# Patient Record
Sex: Female | Born: 1975 | Race: White | Hispanic: No | Marital: Single | Smoking: Current every day smoker
Health system: Southern US, Community
[De-identification: ages and names within clinical notes are randomized; demographics above are authoritative.]

## PROBLEM LIST (undated history)

## (undated) ENCOUNTER — Emergency Department: Disposition: A | Discharge status: Home / Self Care | Payer: Medicaid Other

## (undated) DIAGNOSIS — F419 Anxiety disorder, unspecified: Secondary | ICD-10-CM

## (undated) DIAGNOSIS — F329 Major depressive disorder, single episode, unspecified: Secondary | ICD-10-CM

## (undated) DIAGNOSIS — I10 Essential (primary) hypertension: Secondary | ICD-10-CM

## (undated) DIAGNOSIS — E119 Type 2 diabetes mellitus without complications: Secondary | ICD-10-CM

## (undated) DIAGNOSIS — K589 Irritable bowel syndrome without diarrhea: Secondary | ICD-10-CM

## (undated) DIAGNOSIS — F32A Depression, unspecified: Secondary | ICD-10-CM

## (undated) DIAGNOSIS — K219 Gastro-esophageal reflux disease without esophagitis: Secondary | ICD-10-CM

## (undated) DIAGNOSIS — K859 Acute pancreatitis without necrosis or infection, unspecified: Secondary | ICD-10-CM

## (undated) DIAGNOSIS — T7840XA Allergy, unspecified, initial encounter: Secondary | ICD-10-CM

## (undated) HISTORY — DX: Allergy, unspecified, initial encounter: T78.40XA

## (undated) HISTORY — DX: Gastro-esophageal reflux disease without esophagitis: K21.9

## (undated) HISTORY — DX: Anxiety disorder, unspecified: F41.9

## (undated) HISTORY — DX: Depression, unspecified: F32.A

## (undated) HISTORY — DX: Major depressive disorder, single episode, unspecified: F32.9

## (undated) HISTORY — PX: ABDOMINAL HYSTERECTOMY: SHX81

## (undated) HISTORY — PX: GALLBLADDER SURGERY: SHX652

---

## 2008-08-10 ENCOUNTER — Ambulatory Visit: Payer: Self-pay | Admitting: Internal Medicine

## 2010-02-12 ENCOUNTER — Ambulatory Visit: Payer: Self-pay | Admitting: Internal Medicine

## 2011-01-17 ENCOUNTER — Ambulatory Visit: Payer: Self-pay | Admitting: Internal Medicine

## 2017-10-31 ENCOUNTER — Encounter: Payer: Self-pay | Admitting: Emergency Medicine

## 2017-10-31 ENCOUNTER — Emergency Department
Admission: EM | Admit: 2017-10-31 | Discharge: 2017-11-01 | Disposition: A | Discharge status: Home / Self Care | Payer: Self-pay | Attending: Emergency Medicine | Admitting: Emergency Medicine

## 2017-10-31 DIAGNOSIS — R110 Nausea: Secondary | ICD-10-CM

## 2017-10-31 DIAGNOSIS — F17200 Nicotine dependence, unspecified, uncomplicated: Secondary | ICD-10-CM | POA: Insufficient documentation

## 2017-10-31 DIAGNOSIS — R1013 Epigastric pain: Secondary | ICD-10-CM

## 2017-10-31 DIAGNOSIS — K2900 Acute gastritis without bleeding: Secondary | ICD-10-CM

## 2017-10-31 DIAGNOSIS — K859 Acute pancreatitis without necrosis or infection, unspecified: Secondary | ICD-10-CM

## 2017-10-31 DIAGNOSIS — I10 Essential (primary) hypertension: Secondary | ICD-10-CM | POA: Insufficient documentation

## 2017-10-31 DIAGNOSIS — E876 Hypokalemia: Secondary | ICD-10-CM

## 2017-10-31 HISTORY — DX: Essential (primary) hypertension: I10

## 2017-10-31 HISTORY — DX: Irritable bowel syndrome without diarrhea: K58.9

## 2017-10-31 LAB — COMPREHENSIVE METABOLIC PANEL
ALK PHOS: 94 U/L (ref 38–126)
ALT: 15 U/L (ref 0–44)
AST: 19 U/L (ref 15–41)
Albumin: 4 g/dL (ref 3.5–5.0)
Anion gap: 7 (ref 5–15)
BILIRUBIN TOTAL: 0.1 mg/dL — AB (ref 0.3–1.2)
BUN: 9 mg/dL (ref 6–20)
CALCIUM: 8.6 mg/dL — AB (ref 8.9–10.3)
CO2: 24 mmol/L (ref 22–32)
CREATININE: 0.95 mg/dL (ref 0.44–1.00)
Chloride: 107 mmol/L (ref 98–111)
GFR calc Af Amer: >60 mL/min (ref >60)
GLUCOSE: 127 mg/dL — AB (ref 70–99)
POTASSIUM: 3.1 mmol/L — AB (ref 3.5–5.1)
Sodium: 138 mmol/L (ref 135–145)
TOTAL PROTEIN: 7 g/dL (ref 6.5–8.1)

## 2017-10-31 LAB — CBC
HCT: 33.5 % — ABNORMAL LOW (ref 35.0–47.0)
Hemoglobin: 11.4 g/dL — ABNORMAL LOW (ref 12.0–16.0)
MCH: 26.5 pg (ref 26.0–34.0)
MCHC: 34.2 g/dL (ref 32.0–36.0)
MCV: 77.7 fL — ABNORMAL LOW (ref 80.0–100.0)
PLATELETS: 368 10*3/uL (ref 150–440)
RBC: 4.31 MIL/uL (ref 3.80–5.20)
RDW: 15.3 % — AB (ref 11.5–14.5)
WBC: 14.3 10*3/uL — AB (ref 3.6–11.0)

## 2017-10-31 LAB — LIPASE, BLOOD: LIPASE: 156 U/L — AB (ref 11–51)

## 2017-10-31 MED ORDER — PROMETHAZINE HCL 25 MG PO TABS: 25.0000 mg | ORAL_TABLET | Freq: Four times a day (QID) | ORAL | 0 refills | Status: DC | PRN

## 2017-10-31 MED ORDER — SODIUM CHLORIDE 0.9 % IV BOLUS
1000.0000 mL | Freq: Once | INTRAVENOUS | Status: AC
  Administered 2017-10-31: 1000 mL via INTRAVENOUS

## 2017-10-31 MED ORDER — FAMOTIDINE 20 MG PO TABS: 20.0000 mg | ORAL_TABLET | Freq: Two times a day (BID) | ORAL | 0 refills | Status: DC

## 2017-10-31 MED ORDER — PROMETHAZINE HCL 25 MG/ML IJ SOLN
12.5000 mg | Freq: Once | INTRAMUSCULAR | Status: AC
  Administered 2017-10-31: 12.5 mg via INTRAVENOUS
  Filled 2017-10-31: qty 1

## 2017-10-31 MED ORDER — OXYCODONE HCL 5 MG PO TABS: 5.0000 mg | ORAL_TABLET | ORAL | 0 refills | Status: DC | PRN

## 2017-10-31 MED ORDER — FAMOTIDINE IN NACL 20-0.9 MG/50ML-% IV SOLN
20.0000 mg | Freq: Once | INTRAVENOUS | Status: AC
  Administered 2017-11-01: 20 mg via INTRAVENOUS
  Filled 2017-10-31: qty 50

## 2017-10-31 MED ORDER — MORPHINE SULFATE (PF) 4 MG/ML IV SOLN
4.0000 mg | Freq: Once | INTRAVENOUS | Status: AC
  Administered 2017-11-01: 4 mg via INTRAVENOUS
  Filled 2017-10-31: qty 1

## 2017-10-31 MED ORDER — FENTANYL CITRATE (PF) 100 MCG/2ML IJ SOLN
50.0000 ug | Freq: Once | INTRAMUSCULAR | Status: AC
  Administered 2017-10-31: 50 ug via INTRAVENOUS
  Filled 2017-10-31: qty 2

## 2017-10-31 NOTE — ED Provider Notes (Signed)
[facility] Emergency Department Provider Note   ____________________________________________   First MD Initiated Contact with Patient 10/31/17 2309     (approximate)  I have reviewed the triage vital signs and the nursing notes.   HISTORY  Chief Complaint Abdominal Pain    HPI Terri Berry is a 42 y.o. female who presents to the ED from home via EMS with a chief complaint of abdominal pain.  Patient reports epigastric and left upper quadrant abdominal pain since 7 PM.  Symptoms associated with nausea, no emesis.  Patient was hospitalized earlier this month for acute pancreatitis with gastritis.  Picture was complicated by C. difficile infection as well.  States pain began after she ate a meal of chicken sandwich with mayonnaise and tomatoes.  Denies associated fever, chills, chest pain, shortness of breath, dysuria, diarrhea.  Denies recent travel or trauma.   Past Medical History:  Diagnosis Date  . Hypertension   . IBS (irritable bowel syndrome)     There are no active problems to display for this patient.   History reviewed. No pertinent surgical history.  Prior to Admission medications   Medication Sig Start Date End Date Taking? Authorizing Provider  famotidine (PEPCID) 20 MG tablet Take 1 tablet (20 mg total) by mouth 2 (two) times daily. 10/31/17   Irean Hong, MD  oxyCODONE (OXY IR/ROXICODONE) 5 MG immediate release tablet Take 1 tablet (5 mg total) by mouth every 4 (four) hours as needed for severe pain. 10/31/17   Irean Hong, MD  promethazine (PHENERGAN) 25 MG tablet Take 1 tablet (25 mg total) by mouth every 6 (six) hours as needed for nausea or vomiting. 10/31/17   Irean Hong, MD    Allergies Cefpodoxime; Clarithromycin; Doxycycline; Ferrous gluconate; Levofloxacin; Rizatriptan; Sulfa antibiotics; Topiramate; Venlafaxine; and Zofran [ondansetron hcl]  History reviewed. No pertinent family history.  Social History Social History     Tobacco Use  . Smoking status: Current Every Day Smoker  . Smokeless tobacco: Never Used  Substance Use Topics  . Alcohol use: Not Currently  . Drug use: Not on file    Review of Systems  Constitutional: No fever/chills Eyes: No visual changes. ENT: No sore throat. Cardiovascular: Denies chest pain. Respiratory: Denies shortness of breath. Gastrointestinal: Positive for abdominal pain and nausea, no vomiting.  No diarrhea.  No constipation. Genitourinary: Negative for dysuria. Musculoskeletal: Negative for back pain. Skin: Negative for rash. Neurological: Negative for headaches, focal weakness or numbness.   ____________________________________________   PHYSICAL EXAM:  VITAL SIGNS: ED Triage Vitals [10/31/17 2244]  Enc Vitals Group     BP      Pulse      Resp      Temp      Temp src      SpO2      Weight 204 lb (92.5 kg)     Height 5\' 7"  (1.702 m)     Head Circumference      Peak Flow      Pain Score      Pain Loc      Pain Edu?      Excl. in GC?     Constitutional: Alert and oriented. Well appearing and in mild acute distress. Eyes: Conjunctivae are normal. PERRL. EOMI. Head: Atraumatic. Nose: No congestion/rhinnorhea. Mouth/Throat: Mucous membranes are moist.  Oropharynx non-erythematous. Neck: No stridor.   Cardiovascular: Normal rate, regular rhythm. Grossly normal heart sounds.  Good peripheral circulation. Respiratory: Normal respiratory effort.  No  retractions. Lungs CTAB. Gastrointestinal: Soft and mildly tender to palpation epigastrium without rebound or guarding. No distention. No abdominal bruits. No CVA tenderness. Musculoskeletal: No lower extremity tenderness nor edema.  No joint effusions. Neurologic:  Normal speech and language. No gross focal neurologic deficits are appreciated. No gait instability. Skin:  Skin is warm, dry and intact. No rash noted. Psychiatric: Mood and affect are normal. Speech and behavior are  normal.  ____________________________________________   LABS (all labs ordered are listed, but only abnormal results are displayed)  Labs Reviewed  LIPASE, BLOOD - Abnormal; Notable for the following components:      Result Value   Lipase 156 (*)    All other components within normal limits  COMPREHENSIVE METABOLIC PANEL - Abnormal; Notable for the following components:   Potassium 3.1 (*)    Glucose, Bld 127 (*)    Calcium 8.6 (*)    Total Bilirubin 0.1 (*)    All other components within normal limits  CBC - Abnormal; Notable for the following components:   WBC 14.3 (*)    Hemoglobin 11.4 (*)    HCT 33.5 (*)    MCV 77.7 (*)    RDW 15.3 (*)    All other components within normal limits  URINALYSIS, COMPLETE (UACMP) WITH MICROSCOPIC  POC URINE PREG, ED   ____________________________________________  EKG  None ____________________________________________  RADIOLOGY  ED MD interpretation: None  Official radiology report(s): No results found.  ____________________________________________   PROCEDURES  Procedure(s) performed: None  Procedures  Critical Care performed: No  ____________________________________________   INITIAL IMPRESSION / ASSESSMENT AND PLAN / ED COURSE  As part of my medical decision making, I reviewed the following data within the electronic MEDICAL RECORD NUMBER Nursing notes reviewed and incorporated, Labs reviewed, Old chart reviewed and Notes from prior ED visits   42 year old female who presents with epigastric abdominal pain and nausea. Differential diagnosis includes, but is not limited to, biliary disease (biliary colic, acute cholecystitis, cholangitis, choledocholithiasis, etc), intrathoracic causes for epigastric abdominal pain including ACS, gastritis, duodenitis, pancreatitis, small bowel or large bowel obstruction, abdominal aortic aneurysm, hernia, and ulcer(s).  I personally reviewed patient's recent hospitalization and notes she  had an unremarkable ultrasound as well as CT abdomen/pelvis.  She was treated for uncomplicated pancreatitis.  Tells me she has run out of her pain and nausea medications.  Feel flareup tonight was triggered by diet.  Laboratory results notable for mild leukocytosis, mildly elevated lipase and mild hypokalemia.  In addition to 50 mcg IV fentanyl for pain and 12.5 mg IV Phenergan for nausea, will initiate IV fluid resuscitation.  Patient tells me fentanyl has not touched her pain and states morphine does a better job.  Will administer 4 mg IV morphine and 20 mg IV Pepcid.  I reviewed patient's medication list and it does not look like she is on anything for gastritis.  She is currently crunching ice chips without emesis.  Anticipate discharge home with limited prescriptions for oxycodone, Phenergan and Pepcid.  Record states she missed her GI appointment.  I have stressed the importance of following up with GI since she has multiple GI issues going on currently.  Strict return precautions given.  Patient verbalizes understanding and agrees with plan of care.      ____________________________________________   FINAL CLINICAL IMPRESSION(S) / ED DIAGNOSES  Final diagnoses:  Epigastric pain  Acute gastritis without hemorrhage, unspecified gastritis type  Nausea and vomiting, intractability of vomiting not specified, unspecified vomiting type  ED Discharge Orders         Ordered    oxyCODONE (OXY IR/ROXICODONE) 5 MG immediate release tablet  Every 4 hours PRN     10/31/17 2358    promethazine (PHENERGAN) 25 MG tablet  Every 6 hours PRN     10/31/17 2358    famotidine (PEPCID) 20 MG tablet  2 times daily     10/31/17 2358           Note:  This document was prepared using Dragon voice recognition software and may include unintentional dictation errors.    Irean Hong, MD 11/01/17 [phone]

## 2017-10-31 NOTE — ED Triage Notes (Signed)
Pt arrived via EMS from home with upper left quad. abdominal pain since 1900, with nausea, denies emesis. Pt denies diarrhea. Pt seen at [facility] PCP on 9/24 for gastritis. Pt is scheduled for upper endoscopy.

## 2017-11-01 NOTE — Discharge Instructions (Signed)
1.  You MUST follow-up with your GI doctor as scheduled. 2.  You MUST follow a bland diet.  Avoid greasy, heavy, oily, spicy foods and alcohol. 3.  You may take pain and nausea medicines as needed (Oxycodone/Phenergan #20). 4.  Return to the ER for worsening symptoms, persistent vomiting, difficulty breathing or other concerns.

## 2017-11-15 ENCOUNTER — Other Ambulatory Visit: Payer: Self-pay

## 2017-11-15 ENCOUNTER — Emergency Department: Payer: Self-pay

## 2017-11-15 ENCOUNTER — Emergency Department
Admission: EM | Admit: 2017-11-15 | Discharge: 2017-11-15 | Disposition: A | Discharge status: Home / Self Care | Payer: Self-pay | Attending: Emergency Medicine | Admitting: Emergency Medicine

## 2017-11-15 DIAGNOSIS — F17200 Nicotine dependence, unspecified, uncomplicated: Secondary | ICD-10-CM | POA: Insufficient documentation

## 2017-11-15 DIAGNOSIS — R531 Weakness: Secondary | ICD-10-CM | POA: Insufficient documentation

## 2017-11-15 DIAGNOSIS — I952 Hypotension due to drugs: Secondary | ICD-10-CM | POA: Insufficient documentation

## 2017-11-15 DIAGNOSIS — R4182 Altered mental status, unspecified: Secondary | ICD-10-CM | POA: Insufficient documentation

## 2017-11-15 DIAGNOSIS — I10 Essential (primary) hypertension: Secondary | ICD-10-CM | POA: Insufficient documentation

## 2017-11-15 LAB — URINALYSIS, COMPLETE (UACMP) WITH MICROSCOPIC
Bacteria, UA: NONE SEEN
Bilirubin Urine: NEGATIVE
GLUCOSE, UA: NEGATIVE mg/dL
HGB URINE DIPSTICK: NEGATIVE
Ketones, ur: NEGATIVE mg/dL
LEUKOCYTES UA: NEGATIVE
NITRITE: NEGATIVE
Protein, ur: NEGATIVE mg/dL
SPECIFIC GRAVITY, URINE: 1.016 (ref 1.005–1.030)
pH: 5 (ref 5.0–8.0)

## 2017-11-15 LAB — COMPREHENSIVE METABOLIC PANEL
ALT: 11 U/L (ref 0–44)
ANION GAP: 12 (ref 5–15)
AST: 16 U/L (ref 15–41)
Albumin: 4.1 g/dL (ref 3.5–5.0)
Alkaline Phosphatase: 89 U/L (ref 38–126)
BILIRUBIN TOTAL: 0.2 mg/dL — AB (ref 0.3–1.2)
BUN: 13 mg/dL (ref 6–20)
CO2: 27 mmol/L (ref 22–32)
Calcium: 8.9 mg/dL (ref 8.9–10.3)
Chloride: 97 mmol/L — ABNORMAL LOW (ref 98–111)
Creatinine, Ser: 1.52 mg/dL — ABNORMAL HIGH (ref 0.44–1.00)
GFR calc Af Amer: 47 mL/min — ABNORMAL LOW (ref >60)
GFR, EST NON AFRICAN AMERICAN: 41 mL/min — AB (ref >60)
Glucose, Bld: 112 mg/dL — ABNORMAL HIGH (ref 70–99)
POTASSIUM: 3.5 mmol/L (ref 3.5–5.1)
Sodium: 136 mmol/L (ref 135–145)
TOTAL PROTEIN: 7.3 g/dL (ref 6.5–8.1)

## 2017-11-15 LAB — ETHANOL: Alcohol, Ethyl (B): <10 mg/dL (ref <10)

## 2017-11-15 LAB — URINE DRUG SCREEN, QUALITATIVE (ARMC ONLY)
AMPHETAMINES, UR SCREEN: POSITIVE — AB
Barbiturates, Ur Screen: NOT DETECTED
Benzodiazepine, Ur Scrn: POSITIVE — AB
CANNABINOID 50 NG, UR ~~LOC~~: NOT DETECTED
COCAINE METABOLITE, UR ~~LOC~~: NOT DETECTED
MDMA (ECSTASY) UR SCREEN: NOT DETECTED
Methadone Scn, Ur: NOT DETECTED
Opiate, Ur Screen: NOT DETECTED
Phencyclidine (PCP) Ur S: NOT DETECTED
TRICYCLIC, UR SCREEN: POSITIVE — AB

## 2017-11-15 LAB — CBC
HEMATOCRIT: 39.3 % (ref 36.0–46.0)
Hemoglobin: 12.2 g/dL (ref 12.0–15.0)
MCH: 24.9 pg — AB (ref 26.0–34.0)
MCHC: 31 g/dL (ref 30.0–36.0)
MCV: 80.2 fL (ref 80.0–100.0)
NRBC: 0 % (ref 0.0–0.2)
PLATELETS: 316 10*3/uL (ref 150–400)
RBC: 4.9 MIL/uL (ref 3.87–5.11)
RDW: 14.2 % (ref 11.5–15.5)
WBC: 13.1 10*3/uL — ABNORMAL HIGH (ref 4.0–10.5)

## 2017-11-15 LAB — TROPONIN I

## 2017-11-15 MED ORDER — SODIUM CHLORIDE 0.9 % IV SOLN
1000.0000 mL | Freq: Once | INTRAVENOUS | Status: AC
  Administered 2017-11-15: 1000 mL via INTRAVENOUS

## 2017-11-15 NOTE — ED Provider Notes (Addendum)
[facility] Emergency Department Provider Note   ____________________________________________    I have reviewed the triage vital signs and the nursing notes.   HISTORY  Chief Complaint Dizziness    HPI Terri Berry is a 41 y.o. female who presents with complaints of dizziness and "feeling foggy ".  She reports when she stood up off the toilet prior to arrival she felt quite dizzy and weak.  She states recently discharged from [facility], started new blood pressure medications.  Today she took her usual medications including Lyrica and 2 muscle relaxers along with her new blood pressure medications prior to this occurring.  She denies chest pain or shortness of breath.  No fevers or chills.  No nausea or vomiting or abdominal pain.   Past Medical History:  Diagnosis Date  . Hypertension   . IBS (irritable bowel syndrome)     There are no active problems to display for this patient.   No past surgical history on file.  Prior to Admission medications   Medication Sig Start Date End Date Taking? Authorizing Provider  famotidine (PEPCID) 20 MG tablet Take 1 tablet (20 mg total) by mouth 2 (two) times daily. 10/31/17   Irean Hong, MD  oxyCODONE (OXY IR/ROXICODONE) 5 MG immediate release tablet Take 1 tablet (5 mg total) by mouth every 4 (four) hours as needed for severe pain. 10/31/17   Irean Hong, MD  promethazine (PHENERGAN) 25 MG tablet Take 1 tablet (25 mg total) by mouth every 6 (six) hours as needed for nausea or vomiting. 10/31/17   Irean Hong, MD     Allergies Cefpodoxime; Clarithromycin; Doxycycline; Ferrous gluconate; Levofloxacin; Rizatriptan; Sulfa antibiotics; Topiramate; Venlafaxine; and Zofran [ondansetron hcl]  No family history on file.  Social History Social History   Tobacco Use  . Smoking status: Current Every Day Smoker  . Smokeless tobacco: Never Used  Substance Use Topics  . Alcohol use: Not Currently  . Drug  use: Not on file    Review of Systems  Constitutional: No fever/chills Eyes: No visual changes.  ENT: No sore throat. Cardiovascular: Denies chest pain. Respiratory: Denies shortness of breath. Gastrointestinal: Chronic abdominal pain. no nausea, no vomiting.   Genitourinary: Negative for dysuria. Musculoskeletal: Negative for back pain. Skin: Negative for rash. Neurological: Negative for headaches    ____________________________________________   PHYSICAL EXAM:  VITAL SIGNS: ED Triage Vitals  Enc Vitals Group     BP 11/15/17 2054 (!) 85/56     Pulse Rate 11/15/17 2054 66     Resp 11/15/17 2054 18     Temp 11/15/17 2054 97.8 F (36.6 C)     Temp Source 11/15/17 2054 Oral     SpO2 11/15/17 2054 94 %     Weight 11/15/17 2104 90.7 kg (200 lb)     Height 11/15/17 2104 1.702 m (5\' 7" )     Head Circumference --      Peak Flow --      Pain Score 11/15/17 2103 9     Pain Loc --      Pain Edu? --      Excl. in GC? --     Constitutional: Alert and oriented. Eyes: Conjunctivae are normal.  Head: Atraumatic. Nose: No congestion/rhinnorhea. Mouth/Throat: Mucous membranes are moist.    Cardiovascular: Normal rate, regular rhythm. Grossly normal heart sounds.  Good peripheral circulation. Respiratory: Normal respiratory effort.  No retractions. Lungs CTAB. Gastrointestinal: Soft and nontender. No distention.  Musculoskeletal:   Warm and well perfused Neurologic:  Normal speech and language. No gross focal neurologic deficits are appreciated.  Skin:  Skin is warm, dry and intact. No rash noted. Psychiatric: Mood and affect are normal. Speech and behavior are normal.  ____________________________________________   LABS (all labs ordered are listed, but only abnormal results are displayed)  Labs Reviewed  CBC - Abnormal; Notable for the following components:      Result Value   WBC 13.1 (*)    MCH 24.9 (*)    All other components within normal limits  COMPREHENSIVE  METABOLIC PANEL - Abnormal; Notable for the following components:   Chloride 97 (*)    Glucose, Bld 112 (*)    Creatinine, Ser 1.52 (*)    Total Bilirubin 0.2 (*)    GFR calc non Af Amer 41 (*)    GFR calc Af Amer 47 (*)    All other components within normal limits  URINALYSIS, COMPLETE (UACMP) WITH MICROSCOPIC - Abnormal; Notable for the following components:   Color, Urine YELLOW (*)    APPearance HAZY (*)    All other components within normal limits  URINE DRUG SCREEN, QUALITATIVE ([facility] ONLY) - Abnormal; Notable for the following components:   Tricyclic, Ur Screen POSITIVE (*)    Amphetamines, Ur Screen POSITIVE (*)    Benzodiazepine, Ur Scrn POSITIVE (*)    All other components within normal limits  TROPONIN I  ETHANOL   ____________________________________________  EKG  None ____________________________________________  RADIOLOGY  CT head unremarkable X-ray unremarkable ____________________________________________   PROCEDURES  Procedure(s) performed: No  Procedures   Critical Care performed: No ____________________________________________   INITIAL IMPRESSION / ASSESSMENT AND PLAN / ED COURSE  Pertinent labs & imaging results that were available during my care of the patient were reviewed by me and considered in my medical decision making (see chart for details).  Patient presents with complaints of weakness and dizziness, she is hypotensive in the emergency department but her heart rate is normal.  Overall she appears dehydrated clinically as well but exam is generally reassuring.  She has no chest pain shortness of breath or pleurisy.  No focal weakness or numbness.  No nausea or vomiting or abdominal pain.  Will treat with IV fluids, pending labs, x-ray, CT head, x-ray  Blood pressure has improved with a few 100 cc of fluid.  ----------------------------------------- 11:02 PM on 11/15/2017 -----------------------------------------  After fluids  patient's blood pressure has responded nicely.  Lab work is overall reassuring.  CT head normal.  Chest x-ray normal.  No indication of fever or sepsis or infection.  She appears to be at her baseline now.  I suspect this was polypharmacy as the cause, accidental.  Appropriate for discharge now, will have her discuss with her PCP reducing blood pressure medications, will hold lisinopril for now  Patient and significant other feel quite comfortable with this plan.  Return precautions discussed        ____________________________________________   FINAL CLINICAL IMPRESSION(S) / ED DIAGNOSES  Final diagnoses:  Hypotension due to drugs        Note:  This document was prepared using Dragon voice recognition software and may include unintentional dictation errors.    Jene Every, MD 11/15/17 1610    Jene Every, MD 11/15/17 [phone]

## 2017-11-15 NOTE — ED Triage Notes (Signed)
Pt arrived via ems from home c/o of a panic attack. Pt has hx of HTN and fibromyalgia. Pt is suspected to have taken a good amount of muscle relaxers. Pt is hypotensive on ems arrival, pt lethargic but NAD.

## 2017-11-15 NOTE — ED Notes (Signed)
Peripheral IV discontinued. Catheter intact. No signs of infiltration or redness. Gauze applied to IV site.   Discharge instructions reviewed with patient. Questions fielded by this RN. Patient verbalizes understanding of instructions. Patient discharged home in stable condition per kinner. No acute distress noted at time of discharge.   

## 2017-11-15 NOTE — Discharge Instructions (Addendum)
You had low blood pressure likely due to the medications you take and new blood pressure medications. Please do not take the lisinopril until you have a recheck with your physician.

## 2017-11-28 ENCOUNTER — Inpatient Hospital Stay: Payer: Self-pay

## 2017-11-28 ENCOUNTER — Inpatient Hospital Stay (HOSPITAL_COMMUNITY)
Admit: 2017-11-28 | Discharge: 2017-11-28 | Disposition: A | Discharge status: Home / Self Care | Payer: Self-pay | Attending: Internal Medicine | Admitting: Internal Medicine

## 2017-11-28 ENCOUNTER — Inpatient Hospital Stay
Admission: EM | Admit: 2017-11-28 | Discharge: 2017-12-04 | DRG: 917 | Disposition: A | Discharge status: Home / Self Care | Payer: Self-pay | Attending: Internal Medicine | Admitting: Internal Medicine

## 2017-11-28 ENCOUNTER — Other Ambulatory Visit: Payer: Self-pay

## 2017-11-28 ENCOUNTER — Emergency Department: Payer: Self-pay

## 2017-11-28 DIAGNOSIS — M797 Fibromyalgia: Secondary | ICD-10-CM | POA: Diagnosis present

## 2017-11-28 DIAGNOSIS — F439 Reaction to severe stress, unspecified: Secondary | ICD-10-CM | POA: Diagnosis present

## 2017-11-28 DIAGNOSIS — R109 Unspecified abdominal pain: Secondary | ICD-10-CM

## 2017-11-28 DIAGNOSIS — I5030 Unspecified diastolic (congestive) heart failure: Secondary | ICD-10-CM

## 2017-11-28 DIAGNOSIS — K581 Irritable bowel syndrome with constipation: Secondary | ICD-10-CM | POA: Diagnosis present

## 2017-11-28 DIAGNOSIS — E871 Hypo-osmolality and hyponatremia: Secondary | ICD-10-CM | POA: Diagnosis present

## 2017-11-28 DIAGNOSIS — T50904A Poisoning by unspecified drugs, medicaments and biological substances, undetermined, initial encounter: Principal | ICD-10-CM | POA: Diagnosis present

## 2017-11-28 DIAGNOSIS — I119 Hypertensive heart disease without heart failure: Secondary | ICD-10-CM | POA: Diagnosis present

## 2017-11-28 DIAGNOSIS — F17200 Nicotine dependence, unspecified, uncomplicated: Secondary | ICD-10-CM | POA: Diagnosis present

## 2017-11-28 DIAGNOSIS — T50901A Poisoning by unspecified drugs, medicaments and biological substances, accidental (unintentional), initial encounter: Secondary | ICD-10-CM | POA: Diagnosis present

## 2017-11-28 DIAGNOSIS — Z6833 Body mass index (BMI) 33.0-33.9, adult: Secondary | ICD-10-CM

## 2017-11-28 DIAGNOSIS — Z881 Allergy status to other antibiotic agents status: Secondary | ICD-10-CM

## 2017-11-28 DIAGNOSIS — D638 Anemia in other chronic diseases classified elsewhere: Secondary | ICD-10-CM | POA: Diagnosis present

## 2017-11-28 DIAGNOSIS — J9811 Atelectasis: Secondary | ICD-10-CM | POA: Diagnosis present

## 2017-11-28 DIAGNOSIS — E785 Hyperlipidemia, unspecified: Secondary | ICD-10-CM | POA: Diagnosis present

## 2017-11-28 DIAGNOSIS — Z79899 Other long term (current) drug therapy: Secondary | ICD-10-CM

## 2017-11-28 DIAGNOSIS — R140 Abdominal distension (gaseous): Secondary | ICD-10-CM | POA: Diagnosis not present

## 2017-11-28 DIAGNOSIS — T50904D Poisoning by unspecified drugs, medicaments and biological substances, undetermined, subsequent encounter: Secondary | ICD-10-CM

## 2017-11-28 DIAGNOSIS — Z882 Allergy status to sulfonamides status: Secondary | ICD-10-CM

## 2017-11-28 DIAGNOSIS — E8881 Metabolic syndrome: Secondary | ICD-10-CM | POA: Diagnosis present

## 2017-11-28 DIAGNOSIS — R933 Abnormal findings on diagnostic imaging of other parts of digestive tract: Secondary | ICD-10-CM | POA: Diagnosis present

## 2017-11-28 DIAGNOSIS — J9600 Acute respiratory failure, unspecified whether with hypoxia or hypercapnia: Secondary | ICD-10-CM

## 2017-11-28 DIAGNOSIS — E875 Hyperkalemia: Secondary | ICD-10-CM | POA: Diagnosis present

## 2017-11-28 DIAGNOSIS — N179 Acute kidney failure, unspecified: Secondary | ICD-10-CM | POA: Diagnosis present

## 2017-11-28 DIAGNOSIS — G43909 Migraine, unspecified, not intractable, without status migrainosus: Secondary | ICD-10-CM | POA: Diagnosis present

## 2017-11-28 DIAGNOSIS — J9601 Acute respiratory failure with hypoxia: Secondary | ICD-10-CM | POA: Diagnosis present

## 2017-11-28 DIAGNOSIS — J690 Pneumonitis due to inhalation of food and vomit: Secondary | ICD-10-CM | POA: Diagnosis present

## 2017-11-28 DIAGNOSIS — D72829 Elevated white blood cell count, unspecified: Secondary | ICD-10-CM | POA: Diagnosis present

## 2017-11-28 DIAGNOSIS — R402422 Glasgow coma scale score 9-12, at arrival to emergency department: Secondary | ICD-10-CM | POA: Diagnosis present

## 2017-11-28 DIAGNOSIS — R609 Edema, unspecified: Secondary | ICD-10-CM | POA: Diagnosis not present

## 2017-11-28 DIAGNOSIS — F329 Major depressive disorder, single episode, unspecified: Secondary | ICD-10-CM | POA: Diagnosis present

## 2017-11-28 DIAGNOSIS — E739 Lactose intolerance, unspecified: Secondary | ICD-10-CM | POA: Diagnosis present

## 2017-11-28 DIAGNOSIS — Z888 Allergy status to other drugs, medicaments and biological substances status: Secondary | ICD-10-CM

## 2017-11-28 DIAGNOSIS — K760 Fatty (change of) liver, not elsewhere classified: Secondary | ICD-10-CM | POA: Diagnosis present

## 2017-11-28 DIAGNOSIS — G92 Toxic encephalopathy: Secondary | ICD-10-CM | POA: Diagnosis present

## 2017-11-28 DIAGNOSIS — Z9049 Acquired absence of other specified parts of digestive tract: Secondary | ICD-10-CM

## 2017-11-28 DIAGNOSIS — E538 Deficiency of other specified B group vitamins: Secondary | ICD-10-CM | POA: Diagnosis present

## 2017-11-28 DIAGNOSIS — E1165 Type 2 diabetes mellitus with hyperglycemia: Secondary | ICD-10-CM | POA: Diagnosis present

## 2017-11-28 DIAGNOSIS — E781 Pure hyperglyceridemia: Secondary | ICD-10-CM | POA: Diagnosis present

## 2017-11-28 DIAGNOSIS — K858 Other acute pancreatitis without necrosis or infection: Secondary | ICD-10-CM | POA: Diagnosis present

## 2017-11-28 DIAGNOSIS — F419 Anxiety disorder, unspecified: Secondary | ICD-10-CM | POA: Diagnosis present

## 2017-11-28 DIAGNOSIS — E669 Obesity, unspecified: Secondary | ICD-10-CM | POA: Diagnosis present

## 2017-11-28 LAB — URINE DRUG SCREEN, QUALITATIVE (ARMC ONLY)
AMPHETAMINES, UR SCREEN: NOT DETECTED
BARBITURATES, UR SCREEN: NOT DETECTED
Benzodiazepine, Ur Scrn: NOT DETECTED
COCAINE METABOLITE, UR ~~LOC~~: NOT DETECTED
Cannabinoid 50 Ng, Ur ~~LOC~~: NOT DETECTED
MDMA (ECSTASY) UR SCREEN: NOT DETECTED
METHADONE SCREEN, URINE: NOT DETECTED
OPIATE, UR SCREEN: NOT DETECTED
Phencyclidine (PCP) Ur S: NOT DETECTED
TRICYCLIC, UR SCREEN: POSITIVE — AB

## 2017-11-28 LAB — COMPREHENSIVE METABOLIC PANEL
ALBUMIN: 3.9 g/dL (ref 3.5–5.0)
ALK PHOS: 81 U/L (ref 38–126)
ALK PHOS: 91 U/L (ref 38–126)
ALT: 10 U/L (ref 0–44)
ALT: 14 U/L (ref 0–44)
ANION GAP: 6 (ref 5–15)
AST: 21 U/L (ref 15–41)
AST: 17 U/L (ref 15–41)
Albumin: 4.1 g/dL (ref 3.5–5.0)
Anion gap: 11 (ref 5–15)
BILIRUBIN TOTAL: 0.4 mg/dL (ref 0.3–1.2)
BUN: 22 mg/dL — AB (ref 6–20)
BUN: 18 mg/dL (ref 6–20)
CO2: 22 mmol/L (ref 22–32)
CO2: 23 mmol/L (ref 22–32)
Calcium: 8.4 mg/dL — ABNORMAL LOW (ref 8.9–10.3)
Calcium: 8.6 mg/dL — ABNORMAL LOW (ref 8.9–10.3)
Chloride: 105 mmol/L (ref 98–111)
Chloride: 103 mmol/L (ref 98–111)
Creatinine, Ser: 1.68 mg/dL — ABNORMAL HIGH (ref 0.44–1.00)
Creatinine, Ser: 1.05 mg/dL — ABNORMAL HIGH (ref 0.44–1.00)
GFR calc Af Amer: 42 mL/min — ABNORMAL LOW (ref >60)
GFR calc non Af Amer: 36 mL/min — ABNORMAL LOW (ref >60)
GFR calc non Af Amer: >60 mL/min (ref >60)
GLUCOSE: 133 mg/dL — AB (ref 70–99)
Glucose, Bld: 125 mg/dL — ABNORMAL HIGH (ref 70–99)
POTASSIUM: 6 mmol/L — AB (ref 3.5–5.1)
Potassium: 4.7 mmol/L (ref 3.5–5.1)
SODIUM: 133 mmol/L — AB (ref 135–145)
SODIUM: 137 mmol/L (ref 135–145)
TOTAL PROTEIN: 7.7 g/dL (ref 6.5–8.1)
Total Bilirubin: 0.4 mg/dL (ref 0.3–1.2)
Total Protein: 7.1 g/dL (ref 6.5–8.1)

## 2017-11-28 LAB — CK: Total CK: 114 U/L (ref 38–234)

## 2017-11-28 LAB — PHOSPHORUS
PHOSPHORUS: 3.8 mg/dL (ref 2.5–4.6)
Phosphorus: 3.8 mg/dL (ref 2.5–4.6)
Phosphorus: 2.6 mg/dL (ref 2.5–4.6)

## 2017-11-28 LAB — URINALYSIS, COMPLETE (UACMP) WITH MICROSCOPIC
Bilirubin Urine: NEGATIVE
Glucose, UA: NEGATIVE mg/dL
Hgb urine dipstick: NEGATIVE
Ketones, ur: NEGATIVE mg/dL
Leukocytes, UA: NEGATIVE
Nitrite: NEGATIVE
PH: 5 (ref 5.0–8.0)
Protein, ur: NEGATIVE mg/dL
Specific Gravity, Urine: 1.017 (ref 1.005–1.030)

## 2017-11-28 LAB — BASIC METABOLIC PANEL
ANION GAP: 8 (ref 5–15)
ANION GAP: 6 (ref 5–15)
ANION GAP: 9 (ref 5–15)
BUN: 24 mg/dL — ABNORMAL HIGH (ref 6–20)
BUN: 23 mg/dL — AB (ref 6–20)
BUN: 18 mg/dL (ref 6–20)
CHLORIDE: 104 mmol/L (ref 98–111)
CO2: 23 mmol/L (ref 22–32)
CO2: 26 mmol/L (ref 22–32)
CO2: 25 mmol/L (ref 22–32)
Calcium: 8 mg/dL — ABNORMAL LOW (ref 8.9–10.3)
Calcium: 8.7 mg/dL — ABNORMAL LOW (ref 8.9–10.3)
Calcium: 8.7 mg/dL — ABNORMAL LOW (ref 8.9–10.3)
Chloride: 106 mmol/L (ref 98–111)
Chloride: 105 mmol/L (ref 98–111)
Creatinine, Ser: 1.46 mg/dL — ABNORMAL HIGH (ref 0.44–1.00)
Creatinine, Ser: 1.4 mg/dL — ABNORMAL HIGH (ref 0.44–1.00)
Creatinine, Ser: 1.05 mg/dL — ABNORMAL HIGH (ref 0.44–1.00)
GFR calc Af Amer: 50 mL/min — ABNORMAL LOW (ref >60)
GFR calc Af Amer: 52 mL/min — ABNORMAL LOW (ref >60)
GFR calc Af Amer: >60 mL/min (ref >60)
GFR, EST NON AFRICAN AMERICAN: 43 mL/min — AB (ref >60)
GFR, EST NON AFRICAN AMERICAN: 45 mL/min — AB (ref >60)
GLUCOSE: 261 mg/dL — AB (ref 70–99)
GLUCOSE: 92 mg/dL (ref 70–99)
GLUCOSE: 128 mg/dL — AB (ref 70–99)
POTASSIUM: 6.3 mmol/L — AB (ref 3.5–5.1)
POTASSIUM: 5 mmol/L (ref 3.5–5.1)
Potassium: 4.7 mmol/L (ref 3.5–5.1)
SODIUM: 139 mmol/L (ref 135–145)
Sodium: 135 mmol/L (ref 135–145)
Sodium: 138 mmol/L (ref 135–145)

## 2017-11-28 LAB — CREATININE, URINE, RANDOM: Creatinine, Urine: 123 mg/dL

## 2017-11-28 LAB — ACETAMINOPHEN LEVEL: Acetaminophen (Tylenol), Serum: <10 ug/mL — ABNORMAL LOW (ref 10–30)

## 2017-11-28 LAB — APTT: aPTT: 28 seconds (ref 24–36)

## 2017-11-28 LAB — CBC WITH DIFFERENTIAL/PLATELET
Abs Immature Granulocytes: 0.15 10*3/uL — ABNORMAL HIGH (ref 0.00–0.07)
Basophils Absolute: 0.2 10*3/uL — ABNORMAL HIGH (ref 0.0–0.1)
Basophils Relative: 1 %
Eosinophils Absolute: 0.4 10*3/uL (ref 0.0–0.5)
Eosinophils Relative: 2 %
HCT: 38.8 % (ref 36.0–46.0)
Hemoglobin: 11.7 g/dL — ABNORMAL LOW (ref 12.0–15.0)
IMMATURE GRANULOCYTES: 1 %
LYMPHS ABS: 7.1 10*3/uL — AB (ref 0.7–4.0)
Lymphocytes Relative: 42 %
MCH: 25.2 pg — AB (ref 26.0–34.0)
MCHC: 30.2 g/dL (ref 30.0–36.0)
MCV: 83.6 fL (ref 80.0–100.0)
MONO ABS: 1.6 10*3/uL — AB (ref 0.1–1.0)
MONOS PCT: 9 %
NEUTROS ABS: 7.4 10*3/uL (ref 1.7–7.7)
NEUTROS PCT: 45 %
PLATELETS: 354 10*3/uL (ref 150–400)
RBC: 4.64 MIL/uL (ref 3.87–5.11)
RDW: 14.8 % (ref 11.5–15.5)
WBC: 16.7 10*3/uL — ABNORMAL HIGH (ref 4.0–10.5)
nRBC: 0 % (ref 0.0–0.2)

## 2017-11-28 LAB — BLOOD GAS, ARTERIAL
ACID-BASE DEFICIT: 6.7 mmol/L — AB (ref 0.0–2.0)
Bicarbonate: 20.9 mmol/L (ref 20.0–28.0)
FIO2: 30
MECHANICAL RATE: 22
O2 Saturation: 97.2 %
PEEP: 5 cmH2O
Patient temperature: 37
VT: 450 mL
pCO2 arterial: 50 mmHg — ABNORMAL HIGH (ref 32.0–48.0)
pH, Arterial: 7.23 — ABNORMAL LOW (ref 7.350–7.450)
pO2, Arterial: 108 mmHg (ref 83.0–108.0)

## 2017-11-28 LAB — NA AND K (SODIUM & POTASSIUM), RAND UR
Potassium Urine: 62 mmol/L
Sodium, Ur: 88 mmol/L

## 2017-11-28 LAB — MAGNESIUM
MAGNESIUM: 1.7 mg/dL (ref 1.7–2.4)
Magnesium: 2.1 mg/dL (ref 1.7–2.4)
Magnesium: 2 mg/dL (ref 1.7–2.4)

## 2017-11-28 LAB — GLUCOSE, CAPILLARY
GLUCOSE-CAPILLARY: 146 mg/dL — AB (ref 70–99)
GLUCOSE-CAPILLARY: 172 mg/dL — AB (ref 70–99)
GLUCOSE-CAPILLARY: 116 mg/dL — AB (ref 70–99)
GLUCOSE-CAPILLARY: 83 mg/dL (ref 70–99)
Glucose-Capillary: 169 mg/dL — ABNORMAL HIGH (ref 70–99)

## 2017-11-28 LAB — GLUCOSE, RANDOM: GLUCOSE: 189 mg/dL — AB (ref 70–99)

## 2017-11-28 LAB — AMYLASE: Amylase: 229 U/L — ABNORMAL HIGH (ref 28–100)

## 2017-11-28 LAB — ECHOCARDIOGRAM COMPLETE
HEIGHTINCHES: 67 in
WEIGHTICAEL: 3460.34 [oz_av]

## 2017-11-28 LAB — SALICYLATE LEVEL: Salicylate Lvl: <7 mg/dL (ref 2.8–30.0)

## 2017-11-28 LAB — PROCALCITONIN

## 2017-11-28 LAB — ETHANOL

## 2017-11-28 LAB — MRSA PCR SCREENING: MRSA BY PCR: NEGATIVE

## 2017-11-28 LAB — PROTIME-INR
INR: 1.06
Prothrombin Time: 13.7 seconds (ref 11.4–15.2)

## 2017-11-28 LAB — LIPASE, BLOOD: Lipase: 261 U/L — ABNORMAL HIGH (ref 11–51)

## 2017-11-28 MED ORDER — SODIUM CHLORIDE 0.9 % IV BOLUS
1000.0000 mL | Freq: Once | INTRAVENOUS | Status: AC
  Administered 2017-11-28: 1000 mL via INTRAVENOUS

## 2017-11-28 MED ORDER — INSULIN ASPART 100 UNIT/ML ~~LOC~~ SOLN: 0.0000 [IU] | Freq: Every day | SUBCUTANEOUS | Status: DC

## 2017-11-28 MED ORDER — SIMETHICONE 40 MG/0.6ML PO SUSP
80.0000 mg | Freq: Four times a day (QID) | ORAL | Status: DC | PRN
  Administered 2017-11-28 – 2017-11-29 (×5): 80 mg via ORAL
  Filled 2017-11-28 (×14): qty 1.2

## 2017-11-28 MED ORDER — NICOTINE 14 MG/24HR TD PT24
14.0000 mg | MEDICATED_PATCH | Freq: Every day | TRANSDERMAL | Status: DC
  Administered 2017-11-28 – 2017-12-04 (×7): 14 mg via TRANSDERMAL
  Filled 2017-11-28 (×7): qty 1

## 2017-11-28 MED ORDER — SODIUM CHLORIDE 0.45 % IV BOLUS: 1000.0000 mL | Freq: Once | INTRAVENOUS | Status: DC

## 2017-11-28 MED ORDER — CHLORHEXIDINE GLUCONATE 0.12% ORAL RINSE (MEDLINE KIT)
15.0000 mL | Freq: Two times a day (BID) | OROMUCOSAL | Status: DC
  Administered 2017-11-28: 15 mL via OROMUCOSAL

## 2017-11-28 MED ORDER — ALBUTEROL SULFATE (2.5 MG/3ML) 0.083% IN NEBU
INHALATION_SOLUTION | RESPIRATORY_TRACT | Status: AC
  Administered 2017-11-28: 10 mg
  Filled 2017-11-28: qty 12

## 2017-11-28 MED ORDER — KETAMINE HCL 10 MG/ML IJ SOLN
100.0000 mg | Freq: Once | INTRAMUSCULAR | Status: AC
  Administered 2017-11-28: 100 mg via INTRAVENOUS

## 2017-11-28 MED ORDER — FENTANYL 2500MCG IN NS 250ML (10MCG/ML) PREMIX INFUSION
100.0000 ug/h | INTRAVENOUS | Status: DC
  Administered 2017-11-28: 150 ug/h via INTRAVENOUS
  Administered 2017-11-28: 100 ug/h via INTRAVENOUS
  Filled 2017-11-28: qty 250

## 2017-11-28 MED ORDER — SUCCINYLCHOLINE CHLORIDE 20 MG/ML IJ SOLN
160.0000 mg | Freq: Once | INTRAMUSCULAR | Status: AC
  Administered 2017-11-28: 160 mg via INTRAVENOUS

## 2017-11-28 MED ORDER — ALBUTEROL (5 MG/ML) CONTINUOUS INHALATION SOLN
10.0000 mg/h | INHALATION_SOLUTION | RESPIRATORY_TRACT | Status: DC
  Filled 2017-11-28: qty 20

## 2017-11-28 MED ORDER — KETOROLAC TROMETHAMINE 15 MG/ML IJ SOLN
15.0000 mg | Freq: Four times a day (QID) | INTRAMUSCULAR | Status: AC | PRN
  Administered 2017-11-28 – 2017-12-03 (×7): 15 mg via INTRAVENOUS
  Filled 2017-11-28 (×7): qty 1

## 2017-11-28 MED ORDER — DEXTROSE 50 % IV SOLN
1.0000 | Freq: Once | INTRAVENOUS | Status: AC
  Administered 2017-11-28: 50 mL via INTRAVENOUS
  Filled 2017-11-28: qty 50

## 2017-11-28 MED ORDER — INSULIN ASPART 100 UNIT/ML IV SOLN
5.0000 [IU] | Freq: Once | INTRAVENOUS | Status: AC
  Administered 2017-11-28: 5 [IU] via INTRAVENOUS
  Filled 2017-11-28 (×2): qty 0.05

## 2017-11-28 MED ORDER — PROPOFOL 1000 MG/100ML IV EMUL
5.0000 ug/kg/min | Freq: Once | INTRAVENOUS | Status: AC
  Administered 2017-11-28: 40 ug/kg/min via INTRAVENOUS
  Filled 2017-11-28: qty 100

## 2017-11-28 MED ORDER — ORAL CARE MOUTH RINSE
15.0000 mL | Freq: Two times a day (BID) | OROMUCOSAL | Status: DC
  Administered 2017-11-28 – 2017-12-04 (×6): 15 mL via OROMUCOSAL

## 2017-11-28 MED ORDER — HEPARIN SODIUM (PORCINE) 5000 UNIT/ML IJ SOLN
5000.0000 [IU] | Freq: Three times a day (TID) | INTRAMUSCULAR | Status: DC
  Administered 2017-11-28 – 2017-11-29 (×4): 5000 [IU] via SUBCUTANEOUS
  Filled 2017-11-28 (×4): qty 1

## 2017-11-28 MED ORDER — FENTANYL CITRATE (PF) 100 MCG/2ML IJ SOLN: 100.0000 ug | INTRAMUSCULAR | Status: DC | PRN

## 2017-11-28 MED ORDER — SODIUM POLYSTYRENE SULFONATE 15 GM/60ML PO SUSP: 30.0000 g | Freq: Once | ORAL | Status: DC

## 2017-11-28 MED ORDER — SODIUM CHLORIDE 0.9 % IV SOLN
INTRAVENOUS | Status: DC
  Administered 2017-11-28 – 2017-11-29 (×2): via INTRAVENOUS

## 2017-11-28 MED ORDER — PANCRELIPASE (LIP-PROT-AMYL) 12000-38000 UNITS PO CPEP
24000.0000 [IU] | ORAL_CAPSULE | Freq: Three times a day (TID) | ORAL | Status: DC
  Administered 2017-11-28: 24000 [IU] via ORAL
  Filled 2017-11-28: qty 2

## 2017-11-28 MED ORDER — SODIUM POLYSTYRENE SULFONATE 15 GM/60ML PO SUSP
30.0000 g | Freq: Once | ORAL | Status: AC
  Administered 2017-11-28: 30 g
  Filled 2017-11-28: qty 120

## 2017-11-28 MED ORDER — PANTOPRAZOLE SODIUM 40 MG PO PACK
40.0000 mg | PACK | Freq: Every day | ORAL | Status: DC
  Administered 2017-11-28: 40 mg
  Filled 2017-11-28: qty 20

## 2017-11-28 MED ORDER — MELATONIN 5 MG PO TABS
10.0000 mg | ORAL_TABLET | Freq: Every day | ORAL | Status: DC
  Administered 2017-11-28 – 2017-12-03 (×6): 10 mg via ORAL
  Filled 2017-11-28 (×8): qty 2

## 2017-11-28 MED ORDER — CALCIUM GLUCONATE-NACL 1-0.675 GM/50ML-% IV SOLN
1.0000 g | Freq: Once | INTRAVENOUS | Status: AC
  Administered 2017-11-28: 1000 mg via INTRAVENOUS
  Filled 2017-11-28: qty 50

## 2017-11-28 MED ORDER — INSULIN ASPART 100 UNIT/ML ~~LOC~~ SOLN
0.0000 [IU] | SUBCUTANEOUS | Status: DC
  Administered 2017-11-28 (×2): 2 [IU] via SUBCUTANEOUS
  Filled 2017-11-28 (×2): qty 1

## 2017-11-28 MED ORDER — INSULIN ASPART 100 UNIT/ML ~~LOC~~ SOLN: 0.0000 [IU] | Freq: Three times a day (TID) | SUBCUTANEOUS | Status: DC

## 2017-11-28 MED ORDER — IPRATROPIUM-ALBUTEROL 0.5-2.5 (3) MG/3ML IN SOLN: 3.0000 mL | RESPIRATORY_TRACT | Status: DC | PRN

## 2017-11-28 MED ORDER — CLONAZEPAM 0.5 MG PO TABS
0.5000 mg | ORAL_TABLET | Freq: Three times a day (TID) | ORAL | Status: DC | PRN
  Administered 2017-11-28 – 2017-12-04 (×8): 0.5 mg via ORAL
  Filled 2017-11-28 (×8): qty 1

## 2017-11-28 MED ORDER — DIPHENHYDRAMINE HCL 50 MG/ML IJ SOLN
25.0000 mg | Freq: Four times a day (QID) | INTRAMUSCULAR | Status: DC | PRN
  Administered 2017-11-28: 25 mg via INTRAVENOUS
  Filled 2017-11-28: qty 1

## 2017-11-28 MED ORDER — PROPOFOL 1000 MG/100ML IV EMUL
INTRAVENOUS | Status: AC
  Filled 2017-11-28: qty 100

## 2017-11-28 MED ORDER — FENTANYL CITRATE (PF) 100 MCG/2ML IJ SOLN
25.0000 ug | Freq: Once | INTRAMUSCULAR | Status: AC
  Administered 2017-11-28: 25 ug via INTRAVENOUS
  Filled 2017-11-28: qty 2

## 2017-11-28 MED ORDER — ORAL CARE MOUTH RINSE
15.0000 mL | OROMUCOSAL | Status: DC
  Administered 2017-11-28 (×2): 15 mL via OROMUCOSAL

## 2017-11-28 MED ORDER — SODIUM CHLORIDE 0.9 % IV SOLN
INTRAVENOUS | Status: AC
  Administered 2017-11-28 (×2): via INTRAVENOUS

## 2017-11-28 MED ORDER — PANTOPRAZOLE SODIUM 40 MG PO TBEC
40.0000 mg | DELAYED_RELEASE_TABLET | Freq: Every day | ORAL | Status: DC
  Administered 2017-11-29 – 2017-12-04 (×6): 40 mg via ORAL
  Filled 2017-11-28 (×6): qty 1

## 2017-11-28 MED ORDER — NOREPINEPHRINE 4 MG/250ML-% IV SOLN
0.0000 ug/min | INTRAVENOUS | Status: DC
  Administered 2017-11-28: 10 ug/min via INTRAVENOUS
  Filled 2017-11-28: qty 250

## 2017-11-28 MED ORDER — SODIUM CHLORIDE 0.9 % IV SOLN
3.0000 g | Freq: Four times a day (QID) | INTRAVENOUS | Status: AC
  Administered 2017-11-28 – 2017-12-02 (×19): 3 g via INTRAVENOUS
  Filled 2017-11-28 (×20): qty 3

## 2017-11-28 MED ORDER — SODIUM BICARBONATE 8.4 % IV SOLN
50.0000 meq | Freq: Once | INTRAVENOUS | Status: AC
  Administered 2017-11-28: 50 meq via INTRAVENOUS
  Filled 2017-11-28: qty 50

## 2017-11-28 MED ORDER — FENTANYL CITRATE (PF) 100 MCG/2ML IJ SOLN
200.0000 ug | Freq: Once | INTRAMUSCULAR | Status: AC
  Administered 2017-11-28: 200 ug via INTRAVENOUS

## 2017-11-28 MED ORDER — INSULIN REGULAR HUMAN 100 UNIT/ML IJ SOLN
10.0000 [IU] | Freq: Once | INTRAMUSCULAR | Status: AC
  Administered 2017-11-28: 10 [IU] via INTRAVENOUS
  Filled 2017-11-28: qty 10

## 2017-11-28 NOTE — Progress Notes (Signed)
eLink Physician-Brief Progress Note Patient Name: Terri Berry DOB: [birth year] MRN: [id number]   Date of Service  11/28/2017  HPI/Events of Note  History of polypharmacy, found down by father on the bathroom floor unresponsive.  Father said must have been about 15-20 minutes. Intubated for airway protection.  eICU Interventions  Altered sensorium possibly from drug overdose.  Attempt at weaning down sedation as tolerated.     Intervention Category Major Interventions: Airway management Evaluation Type: New Patient Evaluation  Darl Pikes 11/28/2017, 5:17 AM

## 2017-11-28 NOTE — H&P (Signed)
[employer] [facility] [place] [facility]   PATIENT NAME: Terri Berry    MR#:  [id number]  DATE OF BIRTH:  1975/08/05  DATE OF ADMISSION:  11/28/2017  PRIMARY CARE PHYSICIAN: Thomes Dinning, MD   REQUESTING/REFERRING PHYSICIAN: Merrily Brittle, MD  CHIEF COMPLAINT:   Chief Complaint  Patient presents with  . Drug Overdose    HISTORY OF PRESENT ILLNESS:  Terri Berry  is a 42 y.o. female with a known history of obesity, T2NIDDM, HTN, HLD, IBS, fibromyalgia, depression, p/w overdose. Not much is known of the circumstances surrounding her presentation. She was reportedly found lying on the ground unresponsive by her father. EMS was called. Pt was reportedly hypoxic w/ SpO2 92%. Narcan was administered without response. The only medication bottle found at the location was for Omeprazole. She was intubated on arrival to ED.  She reportedly mumbled something about "Zanaflex" prior to intubation. No further Hx/ROS unobtainable.  It is of note that pt had an ED visit on 11/15/2017 for polypharmacy w/ unintentional overdose of Lyrica and muscle relaxants (potentiated by dehydration) resulting in hypotension, generalized weakness, lightheadedness and lethargy.  Pt appears to be prescribed a litany of psychotropic medications on an outpt basis. Her medication list includes: Prozac, Klonopin, Remeron, Lyrica, Nortriptyline, Oxycodone and Zanaflex. Her other medications include: Omeprazole, Atorvastatin, Lisinopril, Lasix, Inderal, Compazine and Phenergan. I am presently unsure as to whether or not the pt is actually taking any/all of these medications, and/or if one or more of these medications are involved/implicated in her presentation. EKG demonstrates NSR @ 70bpm, QTc 481. UTox (+) TCA (likely 2/2 muscle relaxants), otherwise (-). Labwork is notable for renal failure and hyperkalemia.  PAST MEDICAL HISTORY:   Past Medical History:  Diagnosis Date  . Hypertension   .  IBS (irritable bowel syndrome)     PAST SURGICAL HISTORY:  History reviewed. No pertinent surgical history.  SOCIAL HISTORY:   Social History   Tobacco Use  . Smoking status: Current Every Day Smoker  . Smokeless tobacco: Never Used  Substance Use Topics  . Alcohol use: Not Currently    FAMILY HISTORY:  History reviewed. No pertinent family history.  DRUG ALLERGIES:   Allergies  Allergen Reactions  . Cefpodoxime   . Clarithromycin   . Doxycycline   . Ferrous Gluconate   . Levofloxacin   . Rizatriptan   . Sulfa Antibiotics   . Topiramate   . Venlafaxine   . Zofran [Ondansetron Hcl]     REVIEW OF SYSTEMS:   Review of Systems  Unable to perform ROS: Intubated   Intubated/sedated. MEDICATIONS AT HOME:   Prior to Admission medications   Medication Sig Start Date End Date Taking? Authorizing Provider  famotidine (PEPCID) 20 MG tablet Take 1 tablet (20 mg total) by mouth 2 (two) times daily. 10/31/17   Irean Hong, MD  oxyCODONE (OXY IR/ROXICODONE) 5 MG immediate release tablet Take 1 tablet (5 mg total) by mouth every 4 (four) hours as needed for severe pain. 10/31/17   Irean Hong, MD  promethazine (PHENERGAN) 25 MG tablet Take 1 tablet (25 mg total) by mouth every 6 (six) hours as needed for nausea or vomiting. 10/31/17   Irean Hong, MD      VITAL SIGNS:  Blood pressure (!) 93/59, pulse (!) 58, temperature 98.1 F (36.7 C), temperature source Oral, resp. rate (!) 25, height 5\' 7"  (1.702 m), weight 98.1 kg, SpO2 100 %.  PHYSICAL EXAMINATION:  Physical Exam  Constitutional: She appears well-developed and well-nourished. She is sleeping.  Non-toxic appearance. She does not have a sickly appearance. She does not appear ill. She is sedated and intubated.  HENT:  Head: Atraumatic.  Eyes: Conjunctivae and lids are normal. No scleral icterus.  Neck: Neck supple. No JVD present. No thyromegaly present.  Cardiovascular: Normal rate, regular rhythm, S1 normal, S2  normal and normal heart sounds.  No extrasystoles are present. Exam reveals no gallop, no S3, no S4, no distant heart sounds and no friction rub.  No murmur heard. Pulmonary/Chest: Breath sounds normal. No accessory muscle usage or stridor. No apnea, no tachypnea and no bradypnea. She is intubated. No respiratory distress. She has no decreased breath sounds. She has no wheezes. She has no rhonchi. She has no rales.  Abdominal: Soft. She exhibits no distension. Bowel sounds are decreased. There is no tenderness. There is no rigidity, no rebound and no guarding.  Musculoskeletal: Normal range of motion. She exhibits no edema or tenderness.  Lymphadenopathy:    She has no cervical adenopathy.  Neurological: She is unresponsive.  Intubated/sedated.  Skin: Skin is warm, dry and intact. No rash noted. She is not diaphoretic. No erythema.  Psychiatric: She is noncommunicative.  Intubated/sedated. She is inattentive.   Normal HR @ time of my assessment. LABORATORY PANEL:   CBC Recent Labs  Lab 11/28/17 0206  WBC 16.7*  HGB 11.7*  HCT 38.8  PLT 354   ------------------------------------------------------------------------------------------------------------------  Chemistries  Recent Labs  Lab 11/28/17 0206 11/28/17 0555 11/28/17 0645  NA 133* 135  --   K 6.0* 6.3*  --   CL 105 104  --   CO2 22 23  --   GLUCOSE 133* 261* 189*  BUN 22* 24*  --   CREATININE 1.68* 1.46*  --   CALCIUM 8.4* 8.0*  --   MG 2.1  --   --   AST 21  --   --   ALT 10  --   --   ALKPHOS 81  --   --   BILITOT 0.4  --   --    ------------------------------------------------------------------------------------------------------------------  Cardiac Enzymes No results for input(s): TROPONINI in the last 168 hours. ------------------------------------------------------------------------------------------------------------------  RADIOLOGY:  Dg Chest Portable 1 View  Result Date: 11/28/2017 CLINICAL  DATA:  Found unresponsive EXAM: PORTABLE CHEST 1 VIEW COMPARISON:  11/15/2017 FINDINGS: Endotracheal tube is 2.7 cm above the carina. NG tube enters the stomach. Heart is borderline in size. Mild vascular congestion and bibasilar atelectasis. No effusions or overt edema. IMPRESSION: Borderline heart size, vascular congestion.  Bibasilar atelectasis. Electronically Signed   By: Charlett Nose M.D.   On: 11/28/2017 02:19   IMPRESSION AND PLAN:   A/P: 73F found unresponsive, acute hypoxemic respiratory failure, suspected overdose. Hyperkalemia, renal failure. Hyperglycemia (w/ T2NIDDM), hypocalcemia, leukocytosis, normocytic anemia. -Unresponsiveness, acute hypoxemic respiratory failure, suspected overdose: UTox (+) TCA, likely 2/2 muscle relaxants. Pt mumbled something about "Zanaflex" prior to intubation. Only medication bottle found on location was for Omeprazole. Did not respond to Narcan in the field. Previously-prescribed medications listed in HPI. Holding all medications for now. EKG NSR @ 70bpm, QTc 481. Cardiac monitoring, pulse ox. IVF, supportive care. -Hyperkalemia, renal failure: K+ 6.0. IVF, insulin + dextrose, bicarbonate, Kayexalate. Cr 1.68. Baseline Cr 0.7-1.0 based on review of outpt labs. Renal U/S, urine studies (electrolytes, creatinine, urea nitrogen) pending. Monitor BMP, avoid nephrotoxins. Holding all medications as above. -Hyperglycemia, T2NIDDM: SSI. -Hypocalcemia: Ionized calcium. -Leukocytosis: Likely reactive, suspected to be 2/2  stress/demargination. No obvious infxn, U/A (-), CXR (-) infiltrate/consolidation. -Normocytic anemia: Hgb 11.7, mild. Likely anemia of chronic disease. No evidence of active/acute bleeding at present time. -Holding home meds. -FEN/GI: NPO. -DVT PPx: Heparin. -Code status: Full code. -Disposition: Admission, > 2 midnights.   All the records are reviewed and case discussed with ED provider. Management plans discussed with the patient, family and  they are in agreement.  CODE STATUS: Full code.  TOTAL TIME TAKING CARE OF THIS PATIENT: 75 minutes.    Barbaraann Rondo M.D on 11/28/2017 at 8:39 AM  Between 7am to 6pm - Pager - [phone]  After 6pm go to [email or web address] - Social research officer, government  [facility]  Office  [phone]  CC: Primary care physician; Thomes Dinning, MD   Note: This dictation was prepared with Dragon dictation along with smaller phrase technology. Any transcriptional errors that result from this process are unintentional.

## 2017-11-28 NOTE — ED Notes (Signed)
Levophed stopped at this time BP was 151/99, per Dr. Lamont Snowball

## 2017-11-28 NOTE — ED Triage Notes (Signed)
EMS pt to rm 2 from home with report of pt found unresponsive on the floor by her dad. Possible overdose?. EMS reports O2 sats down to 92% so they gave 0.5 of narcan.

## 2017-11-28 NOTE — Progress Notes (Signed)
Pt c/o pain in abdomen under right breast after eating small amount of muffin. Pt anxious. States pain is 10/10. Pt requests medication for gas. Pt states that she is going to have a panic attack due to the pain. Pt states that she needs her panic attack medicine. States that she has frequent panic attacks. Dr.Samaan aware. Pharmacy aware to reconcile medications from home.

## 2017-11-28 NOTE — Progress Notes (Signed)
Pt requesting to call "Aunt Maggie" on telephone. Called aunt on ascom and updated her as to what was being done to assess pain level and medications given then allowed patient to speak with "Aunt Maggie".

## 2017-11-28 NOTE — Progress Notes (Signed)
abg results given to [facility] .Tawny Asal

## 2017-11-28 NOTE — Consult Note (Addendum)
Name: Terri Berry MRN: [id number] DOB: [birth year]     CONSULTATION DATE: 11/28/2017    HISTORY OF PRESENT ILLNESS:  42 years old lady unknown medical history.  Patient presented to the ED with altered mental status and drug overdose after she was found down by her father.  No H and P available by the time of critical care evaluation and the patient cannot provide history because of being intubated on the ventilator.  Urine drug screen positive for tricyclic and blood work remarkable for acute kidney injury, hyperkalemia and patient had received temporizing agents for hyperkalemia. All history was obtained from on-call E Link report and EMR  PAST MEDICAL HISTORY :   has a past medical history of Hypertension and IBS (irritable bowel syndrome).  has no past surgical history on file. Prior to Admission medications   Medication Sig Start Date End Date Taking? Authorizing Provider  famotidine (PEPCID) 20 MG tablet Take 1 tablet (20 mg total) by mouth 2 (two) times daily. 10/31/17   Irean Hong, MD  oxyCODONE (OXY IR/ROXICODONE) 5 MG immediate release tablet Take 1 tablet (5 mg total) by mouth every 4 (four) hours as needed for severe pain. 10/31/17   Irean Hong, MD  promethazine (PHENERGAN) 25 MG tablet Take 1 tablet (25 mg total) by mouth every 6 (six) hours as needed for nausea or vomiting. 10/31/17   Irean Hong, MD   Allergies  Allergen Reactions  . Cefpodoxime   . Clarithromycin   . Doxycycline   . Ferrous Gluconate   . Levofloxacin   . Rizatriptan   . Sulfa Antibiotics   . Topiramate   . Venlafaxine   . Zofran [Ondansetron Hcl]     FAMILY HISTORY:  family history is not on file. SOCIAL HISTORY:  reports that she has been smoking. She has never used smokeless tobacco. She reports that she drank alcohol.  REVIEW OF SYSTEMS:   Unable to obtain due to critical illness   VITAL SIGNS: Temp:  [98.1 F (36.7 C)] 98.1 F (36.7 C) (10/25 0411) Pulse Rate:  [64-71] 71  (10/25 0411) Resp:  [8-22] 22 (10/25 0411) BP: (98-167)/(74-112) 167/112 (10/25 0411) SpO2:  [99 %-100 %] 100 % (10/25 0411) FiO2 (%):  [30 %-40 %] 30 % (10/25 0631) Weight:  [91 kg-98.1 kg] 98.1 kg (10/25 0411)  Physical Examination:  Sedated with propofol and fentanyl to RASS of -4 On vent, no distress, bilateral equal air entry with no adventitious sounds S1 & S2 are audible with no murmur Benign obese abdomen with feeble peristalsis No leg edema  ASSESSMENT / PLAN: Acute respiratory failure.  Intubated for airway protection. -Monitor ABG, optimize ventilator settings and continuous ventilator support  Altered mental status with drug overdose.  UDS positive for tricyclics.  Reported following command upon arrival to the ice unit was minimal sedation -Monitor neuro status  Questionable suicidal attempt. -Psych consult after extubation.  AKI with hyperkalemia (no EKG changes), hyponatremia was possible intravascular volume depletion as reported the patient was found down versus nephrotoxicity -Optimize hydration, avoid nephrotoxins, comprising agent for hyperkalemia, monitor renal panel and urine outputs. -Renal ultrasound to rule out obstructive uropathy. -Consider renal consult if no improvement  Atelectasis and aspiration pneumonia.  Bibasilar airspace disease with pulmonary congestion and borderline cardiomegaly -Empiric Unasyn.  Monitor CXR + CBC + FiO2  Left atrial enlargement with bimodal P wave on V1 and borderline cardiomegaly on chest x-ray -Echocardiogram  Anemia -Keep hemoglobin more than 7 g/dL  Full code  DVT & GI prophylaxis.  Continue supportive care  Critical care time 45 minutes

## 2017-11-28 NOTE — Progress Notes (Signed)
*  PRELIMINARY RESULTS* Echocardiogram 2D Echocardiogram has been performed.  Terri Berry 11/28/2017, 2:27 PM

## 2017-11-28 NOTE — Consult Note (Signed)
[facility] CONSULT NOTE    Date: 11/28/2017                  Patient Name:  Terri Berry  MRN: [id number]  DOB: November 07, 1975  Age / Sex: 42 y.o., female         PCP: Joanie Coddington, MD                 Service Requesting Consult: Critical Care                 Reason for Consult: Hyperkalemia in the setting of possible TCA overdose.            History of Present Illness: Patient is a 42 y.o. female with a PMHx of hypertension diabetes mellitus type 2, hyperlipidemia, fibromyalgia, depression and irritable bowel syndrome, who was admitted to [facility] on 11/28/2017 for evaluation of overdose.  Patient unable to provide any history as she is currently on the ventilator.  She was found by her father.  When EMS arrived she was hypoxic with sats of 92%.  She was given Narcan without much response.  She had a recent unintentional overdose of Lyrica and muscle relaxants previously in early October.  She has been on a number of psychotropic medications including fluoxetine, clonazepam, Remeron, Lyrica, nortriptyline, oxycodone, and Zanaflex.  Toxicology was positive for TCAs.  Patient was found to have hyperkalemia and acute renal failure.  Her potassium is currently 6.3.  She was administered shifting medications this a.m.   Medications: Outpatient medications: Medications Prior to Admission  Medication Sig Dispense Refill Last Dose  . famotidine (PEPCID) 20 MG tablet Take 1 tablet (20 mg total) by mouth 2 (two) times daily. 60 tablet 0   . oxyCODONE (OXY IR/ROXICODONE) 5 MG immediate release tablet Take 1 tablet (5 mg total) by mouth every 4 (four) hours as needed for severe pain. 20 tablet 0   . promethazine (PHENERGAN) 25 MG tablet Take 1 tablet (25 mg total) by mouth every 6 (six) hours as needed for nausea or vomiting. 20 tablet 0     Current medications: Current Facility-Administered Medications  Medication Dose Route Frequency Provider Last Rate Last Dose  . 0.9 %   sodium chloride infusion   Intravenous Continuous Arta Silence, MD 100 mL/hr at 11/28/17 0851    . albuterol (PROVENTIL,VENTOLIN) solution continuous neb  10 mg/hr Nebulization Continuous Bradly Bienenstock, NP      . Ampicillin-Sulbactam (UNASYN) 3 g in sodium chloride 0.9 % 100 mL IVPB  3 g Intravenous Q6H Arta Silence, MD   Stopped at 11/28/17 0604  . chlorhexidine gluconate (MEDLINE KIT) (PERIDEX) 0.12 % solution 15 mL  15 mL Mouth Rinse BID Darel Hong D, NP   15 mL at 11/28/17 0843  . fentaNYL (SUBLIMAZE) injection 100 mcg  100 mcg Intravenous Q15 min PRN Darel Hong D, NP      . fentaNYL (SUBLIMAZE) injection 100 mcg  100 mcg Intravenous Q2H PRN Darel Hong D, NP      . fentaNYL 2552mg in NS 2548m(1043mml) infusion-PREMIX  100 mcg/hr Intravenous Continuous SriArta SilenceD 10 mL/hr at 11/28/17 0702 100 mcg/hr at 11/28/17 0702  . heparin injection 5,000 Units  5,000 Units Subcutaneous Q8H SriArta SilenceD   5,000 Units at 11/28/17 [phone] insulin aspart (novoLOG) injection 0-9 Units  0-9 Units Subcutaneous Q4H KeeDarel Hong NP   2 Units at 11/28/17 [id number] ipratropium-albuterol (DUONEB) 0.5-2.5 (3) MG/3ML nebulizer  solution 3 mL  3 mL Nebulization Q4H PRN Darel Hong D, NP      . MEDLINE mouth rinse  15 mL Mouth Rinse 10 times per day Bradly Bienenstock, NP   15 mL at 11/28/17 0659  . norepinephrine (LEVOPHED) 70m in D5W 2587mpremix infusion  0-40 mcg/min Intravenous Titrated SrArta SilenceMD   Stopped at 11/28/17 0302  . pantoprazole sodium (PROTONIX) 40 mg/20 mL oral suspension 40 mg  40 mg Per Tube Daily SrArta SilenceMD   40 mg at 11/28/17 0853  . sodium polystyrene (KAYEXALATE) 15 GM/60ML suspension 30 g  30 g Per Tube Once SrArta SilenceMD          Allergies: Allergies  Allergen Reactions  . Cefpodoxime   . Clarithromycin   . Doxycycline   . Ferrous Gluconate   . Levofloxacin   . Rizatriptan   . Sulfa  Antibiotics   . Topiramate   . Venlafaxine   . Zofran [Ondansetron Hcl]       Past Medical History: Past Medical History:  Diagnosis Date  . Hypertension   . IBS (irritable bowel syndrome)      Past Surgical History: History reviewed. No pertinent surgical history.   Family History: History reviewed. No pertinent family history.   Social History: Social History   Socioeconomic History  . Marital status: Single    Spouse name: Not on file  . Number of children: Not on file  . Years of education: Not on file  . Highest education level: Not on file  Occupational History  . Not on file  Social Needs  . Financial resource strain: Not on file  . Food insecurity:    Worry: Not on file    Inability: Not on file  . Transportation needs:    Medical: Not on file    Non-medical: Not on file  Tobacco Use  . Smoking status: Current Every Day Smoker  . Smokeless tobacco: Never Used  Substance and Sexual Activity  . Alcohol use: Not Currently  . Drug use: Not on file  . Sexual activity: Not on file  Lifestyle  . Physical activity:    Days per week: Not on file    Minutes per session: Not on file  . Stress: Not on file  Relationships  . Social connections:    Talks on phone: Not on file    Gets together: Not on file    Attends religious service: Not on file    Active member of club or organization: Not on file    Attends meetings of clubs or organizations: Not on file    Relationship status: Not on file  . Intimate partner violence:    Fear of current or ex partner: Not on file    Emotionally abused: Not on file    Physically abused: Not on file    Forced sexual activity: Not on file  Other Topics Concern  . Not on file  Social History Narrative  . Not on file     Review of Systems: Patient unable to provide as she is currently on the ventilator.  Vital Signs: Blood pressure (!) 93/59, pulse (!) 58, temperature 98.1 F (36.7 C), temperature source Oral,  resp. rate (!) 25, height 5' 7"  (1.702 m), weight 98.1 kg, SpO2 100 %.  Weight trends: Filed Weights   11/28/17 0137 11/28/17 0411  Weight: 91 kg 98.1 kg    Physical Exam: General: Critically ill appearing  Head: Normocephalic, atraumatic.  Eyes: Anicteric, EOMI  Nose: Mucous membranes moist, not inflammed, nonerythematous.  Throat: ETT in place  Neck: Supple, trachea midline.  Lungs:  Normal respiratory effort. Clear to auscultation BL without crackles or wheezes.  Heart: RRR. S1 and S2 normal without gallop, murmur, or rubs.  Abdomen:  BS normoactive. Soft, Nondistended, non-tender.  No masses or organomegaly.  Extremities: No pretibial edema.  Neurologic: Awake, and will follow simple commands  Skin: No visible rashes, scars.    Lab results: Basic Metabolic Panel: Recent Labs  Lab 11/28/17 0206 11/28/17 0555 11/28/17 0645  NA 133* 135  --   K 6.0* 6.3*  --   CL 105 104  --   CO2 22 23  --   GLUCOSE 133* 261* 189*  BUN 22* 24*  --   CREATININE 1.68* 1.46*  --   CALCIUM 8.4* 8.0*  --   MG 2.1  --   --   PHOS 3.8  --   --     Liver Function Tests: Recent Labs  Lab 11/28/17 0206  AST 21  ALT 10  ALKPHOS 81  BILITOT 0.4  PROT 7.1  ALBUMIN 3.9   No results for input(s): LIPASE, AMYLASE in the last 168 hours. No results for input(s): AMMONIA in the last 168 hours.  CBC: Recent Labs  Lab 11/28/17 0206  WBC 16.7*  NEUTROABS 7.4  HGB 11.7*  HCT 38.8  MCV 83.6  PLT 354    Cardiac Enzymes: Recent Labs  Lab 11/28/17 0555  CKTOTAL 114    BNP: Invalid input(s): POCBNP  CBG: Recent Labs  Lab 11/28/17 0358 11/28/17 0652 11/28/17 0747  GLUCAP 146* 169* 172*    Microbiology: Results for orders placed or performed during the hospital encounter of 11/28/17  MRSA PCR Screening     Status: None   Collection Time: 11/28/17  7:38 AM  Result Value Ref Range Status   MRSA by PCR NEGATIVE NEGATIVE Final    Comment:        The GeneXpert MRSA Assay  (FDA approved for NASAL specimens only), is one component of a comprehensive MRSA colonization surveillance program. It is not intended to diagnose MRSA infection nor to guide or monitor treatment for MRSA infections. Performed at [facility], [address] [place], [place] [address]     Coagulation Studies: Recent Labs    11/28/17 0206  LABPROT 13.7  INR 1.06    Urinalysis: Recent Labs    11/28/17 0206  COLORURINE YELLOW*  LABSPEC 1.017  PHURINE 5.0  GLUCOSEU NEGATIVE  HGBUR NEGATIVE  BILIRUBINUR NEGATIVE  KETONESUR NEGATIVE  PROTEINUR NEGATIVE  NITRITE NEGATIVE  LEUKOCYTESUR NEGATIVE      Imaging: Dg Chest Portable 1 View  Result Date: 11/28/2017 CLINICAL DATA:  Found unresponsive EXAM: PORTABLE CHEST 1 VIEW COMPARISON:  11/15/2017 FINDINGS: Endotracheal tube is 2.7 cm above the carina. NG tube enters the stomach. Heart is borderline in size. Mild vascular congestion and bibasilar atelectasis. No effusions or overt edema. IMPRESSION: Borderline heart size, vascular congestion.  Bibasilar atelectasis. Electronically Signed   By: Rolm Baptise M.D.   On: 11/28/2017 02:19      Assessment & Plan: Pt is a 42 y.o. female with a PMHx of hypertension diabetes mellitus type 2, hyperlipidemia, fibromyalgia, depression and irritable bowel syndrome, who was admitted to [facility] on 11/28/2017 for evaluation of overdose.  1.  Acute renal failure, secondary to hypotension. 2.  Hyperkalemia, K 6.3.  3.  Acute respiratory failure.  4.  Hypotension. 5.  Drug overdose, Utox positive for TCAs  Plan: Suspect that the patient's acute renal failure is secondary to hypotension and drug overdose.  She also has concomitant hyperkalemia.  Dialysis is usually not very helpful for TCA overdose as they are very lipophilic.  However we may need to consider dialysis if potassium does not come down with conservative measures.  She is to be extubated later this morning.  We can  consider administering sodium zirconium as well as Veltassa.  However she was just administered shifting measures for the hyperkalemia therefore we recommend checking potassium in another hour.  Otherwise management as per critical care.

## 2017-11-28 NOTE — ED Notes (Signed)
Pt is still moving her arms. Pt was given a 200 mcg bolus of fentanyl per verbal order Dr. Lamont Snowball

## 2017-11-28 NOTE — Progress Notes (Signed)
Pharmacy Antibiotic Note  Terri Berry is a 42 y.o. female admitted on 11/28/2017 found unresponsive with suspected TCA overdose. Patient is currently being treated for aspiration pneumonia and hyperkalemia. Pharmacy has been consulted for Unasyn dosing. Patient is now extubated.   Plan: Continue Unasyn IV 3 g q6h.   Will obtain procalcitonin levels. If therapy remains warranted, recommend treating for 5 full days.   Height: 5\' 7"  (170.2 cm) Weight: 216 lb 4.3 oz (98.1 kg) IBW/kg (Calculated) : 61.6  Temp (24hrs), Avg:98.2 F (36.8 C), Min:98.1 F (36.7 C), Max:98.3 F (36.8 C)  Recent Labs  Lab 11/28/17 0206 11/28/17 0555 11/28/17 1053  WBC 16.7*  --   --   CREATININE 1.68* 1.46* 1.40*    Estimated Creatinine Clearance: 62.3 mL/min (A) (by C-G formula based on SCr of 1.4 mg/dL (H)).    Allergies  Allergen Reactions  . Cefpodoxime   . Clarithromycin   . Doxycycline   . Ferrous Gluconate   . Levofloxacin   . Rizatriptan   . Sulfa Antibiotics   . Topiramate   . Venlafaxine   . Zofran [Ondansetron Hcl]    Antimicrobials this admission: 10/25 Unasyn >>   Dose adjustments this admission:   Microbiology results: 10/25 MRSA PCR: (-)  Thank you for allowing pharmacy to be a part of this patient's care.  Broadus John, PharmD candidate  11/28/2017

## 2017-11-28 NOTE — ED Provider Notes (Signed)
[facility] Emergency Department Provider Note  ____________________________________________   First MD Initiated Contact with Patient 11/28/17 0131     (approximate)  I have reviewed the triage vital signs and the nursing notes.   HISTORY  Chief Complaint Drug Overdose  Level 5 exemption history is limited by the patient's clinical condition  HPI Terri Berry is a 42 y.o. female who comes to the emergency department via EMS after being found semi-conscious in the bathroom by her father.  There is some concern for an unknown drug overdose.  EMS noted that it could have been Suboxone however the patient mumbles "Zanaflex".  EMS found the patient to be hypoxic, obtunded, and did not respond to 1 mg of naloxone.  She had a blood sugar in the 200s in route.    Past Medical History:  Diagnosis Date  . Hypertension   . IBS (irritable bowel syndrome)     Patient Active Problem List   Diagnosis Date Noted  . Overdose 11/28/2017    History reviewed. No pertinent surgical history.  Prior to Admission medications   Medication Sig Start Date End Date Taking? Authorizing Provider  famotidine (PEPCID) 20 MG tablet Take 1 tablet (20 mg total) by mouth 2 (two) times daily. 10/31/17   Paulette Blanch, MD  oxyCODONE (OXY IR/ROXICODONE) 5 MG immediate release tablet Take 1 tablet (5 mg total) by mouth every 4 (four) hours as needed for severe pain. 10/31/17   Paulette Blanch, MD  promethazine (PHENERGAN) 25 MG tablet Take 1 tablet (25 mg total) by mouth every 6 (six) hours as needed for nausea or vomiting. 10/31/17   Paulette Blanch, MD    Allergies Cefpodoxime; Clarithromycin; Doxycycline; Ferrous gluconate; Levofloxacin; Rizatriptan; Sulfa antibiotics; Topiramate; Venlafaxine; and Zofran [ondansetron hcl]  History reviewed. No pertinent family history.  Social History Social History   Tobacco Use  . Smoking status: Current Every Day Smoker  . Smokeless tobacco: Never  Used  Substance Use Topics  . Alcohol use: Not Currently  . Drug use: Not on file    Review of Systems Level 5 exemption history limited by the patient's clinical condition ____________________________________________   PHYSICAL EXAM:  VITAL SIGNS: ED Triage Vitals  Enc Vitals Group     BP      Pulse      Resp      Temp      Temp src      SpO2      Weight      Height      Head Circumference      Peak Flow      Pain Score      Pain Loc      Pain Edu?      Excl. in [place]?     Constitutional: Appears critically ill with sonorous respirations and appears to be aspirating.  Intermittently wakes to sternal rub however quickly passes out again Eyes: Pupils about 5 mm and sluggish Head: Atraumatic. Nose: No congestion/rhinnorhea. Mouth/Throat: No trismus Neck: No stridor.   Cardiovascular: Normal rate, regular rhythm. Grossly normal heart sounds.  Good peripheral circulation. Respiratory: Bradypnic with coarse breath sounds bilaterally Gastrointestinal: Obese soft nontender Musculoskeletal: No lower extremity edema   Neurologic: Localizes to sternal rub all 4 extremities.  GCS of E2V5M2 total 9 Skin:  Skin is warm, dry and intact. No rash noted. Psychiatric: Obtunded   ____________________________________________   DIFFERENTIAL includes but not limited to  Opiate overdose, benzodiazepine overdose, aspiration, arrhythmia  ____________________________________________   LABS (all labs ordered are listed, but only abnormal results are displayed)  Labs Reviewed  COMPREHENSIVE METABOLIC PANEL - Abnormal; Notable for the following components:      Result Value   Sodium 133 (*)    Potassium 6.0 (*)    Glucose, Bld 133 (*)    BUN 22 (*)    Creatinine, Ser 1.68 (*)    Calcium 8.4 (*)    GFR calc non Af Amer 36 (*)    GFR calc Af Amer 42 (*)    All other components within normal limits  CBC WITH DIFFERENTIAL/PLATELET - Abnormal; Notable for the following components:    WBC 16.7 (*)    Hemoglobin 11.7 (*)    MCH 25.2 (*)    Lymphs Abs 7.1 (*)    Monocytes Absolute 1.6 (*)    Basophils Absolute 0.2 (*)    Abs Immature Granulocytes 0.15 (*)    All other components within normal limits  URINALYSIS, COMPLETE (UACMP) WITH MICROSCOPIC - Abnormal; Notable for the following components:   Color, Urine YELLOW (*)    APPearance CLEAR (*)    Bacteria, UA RARE (*)    All other components within normal limits  URINE DRUG SCREEN, QUALITATIVE ([facility] ONLY) - Abnormal; Notable for the following components:   Tricyclic, Ur Screen POSITIVE (*)    All other components within normal limits  ACETAMINOPHEN LEVEL - Abnormal; Notable for the following components:   Acetaminophen (Tylenol), Serum <10 (*)    All other components within normal limits  GLUCOSE, RANDOM - Abnormal; Notable for the following components:   Glucose, Bld 189 (*)    All other components within normal limits  GLUCOSE, CAPILLARY - Abnormal; Notable for the following components:   Glucose-Capillary 146 (*)    All other components within normal limits  BLOOD GAS, ARTERIAL - Abnormal; Notable for the following components:   pH, Arterial 7.23 (*)    pCO2 arterial 50 (*)    Acid-base deficit 6.7 (*)    All other components within normal limits  BASIC METABOLIC PANEL - Abnormal; Notable for the following components:   Potassium 6.3 (*)    Glucose, Bld 261 (*)    BUN 24 (*)    Creatinine, Ser 1.46 (*)    Calcium 8.0 (*)    GFR calc non Af Amer 43 (*)    GFR calc Af Amer 50 (*)    All other components within normal limits  GLUCOSE, CAPILLARY - Abnormal; Notable for the following components:   Glucose-Capillary 169 (*)    All other components within normal limits  GLUCOSE, CAPILLARY - Abnormal; Notable for the following components:   Glucose-Capillary 172 (*)    All other components within normal limits  MRSA PCR SCREENING  CULTURE, RESPIRATORY  MAGNESIUM  PHOSPHORUS  APTT  PROTIME-INR  ETHANOL    SALICYLATE LEVEL  CK  CALCIUM, IONIZED  NA AND K (SODIUM & POTASSIUM), RAND UR  CREATININE, URINE, RANDOM  UREA NITROGEN, URINE  HIV ANTIBODY (ROUTINE TESTING W REFLEX)  BASIC METABOLIC PANEL  BASIC METABOLIC PANEL  BASIC METABOLIC PANEL  CALCIUM, IONIZED  CALCIUM, IONIZED  CALCIUM, IONIZED  MAGNESIUM  MAGNESIUM  MAGNESIUM  PHOSPHORUS  PHOSPHORUS  PHOSPHORUS    Lab work reviewed by me with critical hyperkalemia.  Drug screen positive for try cyclic's __________________________________________  EKG  ED ECG REPORT I, Darel Hong, the attending physician, personally viewed and interpreted this ECG.  Date: 11/28/2017 EKG Time:  Rate:  70 Rhythm: normal sinus rhythm QRS Axis: normal Intervals: normal ST/T Wave abnormalities: normal Narrative Interpretation: no evidence of acute ischemia  ____________________________________________  RADIOLOGY  Chest x-ray reviewed by me with atelectasis.  Endotracheal tube in appropriate position ____________________________________________   PROCEDURES  Procedure(s) performed: Yes  .Critical Care Performed by: Darel Hong, MD Authorized by: Darel Hong, MD   Critical care provider statement:    Critical care time (minutes):  35   Critical care time was exclusive of:  Separately billable procedures and treating other patients   Critical care was necessary to treat or prevent imminent or life-threatening deterioration of the following conditions:  Respiratory failure and toxidrome   Critical care was time spent personally by me on the following activities:  Development of treatment plan with patient or surrogate, discussions with consultants, evaluation of patient's response to treatment, examination of patient, obtaining history from patient or surrogate, ordering and performing treatments and interventions, ordering and review of laboratory studies, ordering and review of radiographic studies, pulse oximetry,  re-evaluation of patient's condition and review of old charts Procedure Name: Intubation Date/Time: 11/28/2017 8:48 AM Performed by: Darel Hong, MD Pre-anesthesia Checklist: Patient identified, Patient being monitored, Emergency Drugs available, Timeout performed and Suction available Oxygen Delivery Method: Nasal cannula Preoxygenation: Pre-oxygenation with 100% oxygen Induction Type: Rapid sequence Ventilation: Mask ventilation without difficulty Laryngoscope Size: Mac and 4 Grade View: Grade I Tube size: 7.5 mm Number of attempts: 1 Placement Confirmation: ETT inserted through vocal cords under direct vision,  CO2 detector and Breath sounds checked- equal and bilateral Secured at: 23 cm Tube secured with: ETT holder    OG placement Date/Time: 11/28/2017 8:49 AM Performed by: Darel Hong, MD Authorized by: Darel Hong, MD  Consent: The procedure was performed in an emergent situation. Patient identity confirmed: arm band Local anesthesia used: no  Anesthesia: Local anesthesia used: no  Sedation: Patient sedated: yes Sedatives: ketamine and fentanyl Vitals: Vital signs were monitored during sedation.  Patient tolerance: Patient tolerated the procedure well with no immediate complications     Critical Care performed: Yes  ____________________________________________   INITIAL IMPRESSION / ASSESSMENT AND PLAN / ED COURSE  Pertinent labs & imaging results that were available during my care of the patient were reviewed by me and considered in my medical decision making (see chart for details).   As part of my medical decision making, I reviewed the following data within the [place] History obtained from family if available, nursing notes, old chart and ekg, as well as notes from prior ED visits.  The patient came to the emergency department profoundly obtunded and actively aspirating.  Unknown drug overdose however she did not respond  to naloxone and her pupils are dilated.  She was clearly not protecting her airway and she was emergently intubated with ketamine and succinylcholine.  She never desaturated during intubation.  Shortly following intubation however the patient did become hypotensive.  I continued bolusing fluids but did order a levo fed infusion for the direct acting properties given her unknown ingestion.  Broad labs including ethanol salicylate and acetaminophen were ordered.  I placed her on propofol and fentanyl infusions for sedation and comfort and discussed with the hospitalist who has graciously agreed to admit the patient to his service.  EKG with normal intervals.  I appreciate that she is try cyclic positive but she has no terminal R wave in aVR.  She remains critically ill at time of admission.  ____________________________________________   FINAL CLINICAL IMPRESSION(S) / ED DIAGNOSES  Final diagnoses:  Drug overdose, undetermined intent, initial encounter  Hyperkalemia      NEW MEDICATIONS STARTED DURING THIS VISIT:  Current Discharge Medication List       Note:  This document was prepared using Dragon voice recognition software and may include unintentional dictation errors.     Darel Hong, MD 11/28/17 [phone]

## 2017-11-28 NOTE — Consult Note (Signed)
[facility] Face-to-Face Psychiatry Consult   Reason for Consult: Consult for this 42 year old woman who came to the emergency room with an apparent overdose Referring Physician: Mody Patient Identification: Terri Berry MRN:  [id number] Principal Diagnosis: Overdose Diagnosis:   Patient Active Problem List   Diagnosis Date Noted  . Overdose [T50.901A] 11/28/2017    Total Time spent with patient: 1 hour  Subjective:   Terri Berry is a 42 y.o. female patient admitted with "my stomach hurts".  HPI: Patient seen in the critical care unit.  This is a 43 year old woman who came to the hospital after her father found her passed out at home.  It was apparently not evident what she might of taken at the time.  Patient required intubation in the emergency room and has been in the critical care unit although she was extubated this afternoon.  Patient was difficult to interview because she was writhing in the bed and complaining loudly of abdominal pain throughout the interview.  She tells me that she does not remember taking any medication and assumes that she only took her usual evening medicines.  She denies having taken any kind of overdose.  Patient denies any suicidal thoughts at all.  Says that her mood has been normal not particularly depressed recently.  She is somatically preoccupied.  She denies that she has been drinking or abusing any drugs.  Denies psychotic symptoms.  Social history: Lives with her father.  Could not give me much in the way of further details  Medical history: Overweight.  Abdominal pain of unclear etiology  Substance abuse history currently patient denies substance abuse history although it looks like she had a toxicology screen that was positive for some controlled substances recently.  Past Psychiatric History: Patient says she has been treated for depression and anxiety and is currently taking Prozac and as needed Klonopin.  Denies hospitalization denies suicide attempts  in the past  Risk to Self:   Risk to Others:   Prior Inpatient Therapy:   Prior Outpatient Therapy:    Past Medical History:  Past Medical History:  Diagnosis Date  . Hypertension   . IBS (irritable bowel syndrome)    History reviewed. No pertinent surgical history. Family History: History reviewed. No pertinent family history. Family Psychiatric  History: Denies any Social History:  Social History   Substance and Sexual Activity  Alcohol Use Not Currently     Social History   Substance and Sexual Activity  Drug Use Not on file    Social History   Socioeconomic History  . Marital status: Single    Spouse name: Not on file  . Number of children: Not on file  . Years of education: Not on file  . Highest education level: Not on file  Occupational History  . Not on file  Social Needs  . Financial resource strain: Not on file  . Food insecurity:    Worry: Not on file    Inability: Not on file  . Transportation needs:    Medical: Not on file    Non-medical: Not on file  Tobacco Use  . Smoking status: Current Every Day Smoker  . Smokeless tobacco: Never Used  Substance and Sexual Activity  . Alcohol use: Not Currently  . Drug use: Not on file  . Sexual activity: Not on file  Lifestyle  . Physical activity:    Days per week: Not on file    Minutes per session: Not on file  . Stress: Not on  file  Relationships  . Social connections:    Talks on phone: Not on file    Gets together: Not on file    Attends religious service: Not on file    Active member of club or organization: Not on file    Attends meetings of clubs or organizations: Not on file    Relationship status: Not on file  Other Topics Concern  . Not on file  Social History Narrative  . Not on file   Additional Social History:    Allergies:   Allergies  Allergen Reactions  . Cefpodoxime   . Clarithromycin   . Doxycycline   . Ferrous Gluconate   . Levofloxacin   . Rizatriptan   . Sulfa  Antibiotics   . Topiramate   . Venlafaxine   . Zofran [Ondansetron Hcl]     Labs:  Results for orders placed or performed during the hospital encounter of 11/28/17 (from the past 48 hour(s))  Comprehensive metabolic panel     Status: Abnormal   Collection Time: 11/28/17  2:06 AM  Result Value Ref Range   Sodium 133 (L) 135 - 145 mmol/L   Potassium 6.0 (H) 3.5 - 5.1 mmol/L    Comment: HEMOLYSIS AT THIS LEVEL MAY AFFECT RESULT   Chloride 105 98 - 111 mmol/L   CO2 22 22 - 32 mmol/L   Glucose, Bld 133 (H) 70 - 99 mg/dL   BUN 22 (H) 6 - 20 mg/dL   Creatinine, Ser 1.68 (H) 0.44 - 1.00 mg/dL   Calcium 8.4 (L) 8.9 - 10.3 mg/dL   Total Protein 7.1 6.5 - 8.1 g/dL   Albumin 3.9 3.5 - 5.0 g/dL   AST 21 15 - 41 U/L   ALT 10 0 - 44 U/L   Alkaline Phosphatase 81 38 - 126 U/L   Total Bilirubin 0.4 0.3 - 1.2 mg/dL   GFR calc non Af Amer 36 (L) >60 mL/min   GFR calc Af Amer 42 (L) >60 mL/min    Comment: (NOTE) The eGFR has been calculated using the CKD EPI equation. This calculation has not been validated in all clinical situations. eGFR's persistently <60 mL/min signify possible Chronic Kidney Disease.    Anion gap 6 5 - 15    Comment: Performed at [facility], [address] [place], [place] [address]  CBC with Differential     Status: Abnormal   Collection Time: 11/28/17  2:06 AM  Result Value Ref Range   WBC 16.7 (H) 4.0 - 10.5 K/uL   RBC 4.64 3.87 - 5.11 MIL/uL   Hemoglobin 11.7 (L) 12.0 - 15.0 g/dL   HCT 38.8 36.0 - 46.0 %   MCV 83.6 80.0 - 100.0 fL   MCH 25.2 (L) 26.0 - 34.0 pg   MCHC 30.2 30.0 - 36.0 g/dL   RDW 14.8 11.5 - 15.5 %   Platelets 354 150 - 400 K/uL   nRBC 0.0 0.0 - 0.2 %   Neutrophils Relative % 45 %   Neutro Abs 7.4 1.7 - 7.7 K/uL   Lymphocytes Relative 42 %   Lymphs Abs 7.1 (H) 0.7 - 4.0 K/uL   Monocytes Relative 9 %   Monocytes Absolute 1.6 (H) 0.1 - 1.0 K/uL   Eosinophils Relative 2 %   Eosinophils Absolute 0.4 0.0 - 0.5 K/uL   Basophils  Relative 1 %   Basophils Absolute 0.2 (H) 0.0 - 0.1 K/uL   WBC Morphology MORPHOLOGY UNREMARKABLE    Smear Review MORPHOLOGY  UNREMARKABLE    Immature Granulocytes 1 %   Abs Immature Granulocytes 0.15 (H) 0.00 - 0.07 K/uL   Dimorphism PRESENT    Polychromasia PRESENT     Comment: Performed at [facility], [address] [place], [place] [address]  Urinalysis, Complete w Microscopic     Status: Abnormal   Collection Time: 11/28/17  2:06 AM  Result Value Ref Range   Color, Urine YELLOW (A) YELLOW   APPearance CLEAR (A) CLEAR   Specific Gravity, Urine 1.017 1.005 - 1.030   pH 5.0 5.0 - 8.0   Glucose, UA NEGATIVE NEGATIVE mg/dL   Hgb urine dipstick NEGATIVE NEGATIVE   Bilirubin Urine NEGATIVE NEGATIVE   Ketones, ur NEGATIVE NEGATIVE mg/dL   Protein, ur NEGATIVE NEGATIVE mg/dL   Nitrite NEGATIVE NEGATIVE   Leukocytes, UA NEGATIVE NEGATIVE   RBC / HPF 0-5 0 - 5 RBC/hpf   WBC, UA 0-5 0 - 5 WBC/hpf   Bacteria, UA RARE (A) NONE SEEN   Squamous Epithelial / LPF 0-5 0 - 5   Mucus PRESENT    Hyaline Casts, UA PRESENT     Comment: Performed at [facility], [address], [place], [place] [address]  Urine Drug Screen, Qualitative     Status: Abnormal   Collection Time: 11/28/17  2:06 AM  Result Value Ref Range   Tricyclic, Ur Screen POSITIVE (A) NONE DETECTED   Amphetamines, Ur Screen NONE DETECTED NONE DETECTED   MDMA (Ecstasy)Ur Screen NONE DETECTED NONE DETECTED   Cocaine Metabolite,Ur [place] NONE DETECTED NONE DETECTED   Opiate, Ur Screen NONE DETECTED NONE DETECTED   Phencyclidine (PCP) Ur S NONE DETECTED NONE DETECTED   Cannabinoid 50 Ng, Ur [place] NONE DETECTED NONE DETECTED   Barbiturates, Ur Screen NONE DETECTED NONE DETECTED   Benzodiazepine, Ur Scrn NONE DETECTED NONE DETECTED   Methadone Scn, Ur NONE DETECTED NONE DETECTED    Comment: (NOTE) Tricyclics + metabolites, urine    Cutoff 1000 ng/mL Amphetamines + metabolites, urine  Cutoff 1000 ng/mL MDMA  (Ecstasy), urine              Cutoff 500 ng/mL Cocaine Metabolite, urine          Cutoff 300 ng/mL Opiate + metabolites, urine        Cutoff 300 ng/mL Phencyclidine (PCP), urine         Cutoff 25 ng/mL Cannabinoid, urine                 Cutoff 50 ng/mL Barbiturates + metabolites, urine  Cutoff 200 ng/mL Benzodiazepine, urine              Cutoff 200 ng/mL Methadone, urine                   Cutoff 300 ng/mL The urine drug screen provides only a preliminary, unconfirmed analytical test result and should not be used for non-medical purposes. Clinical consideration and professional judgment should be applied to any positive drug screen result due to possible interfering substances. A more specific alternate chemical method must be used in order to obtain a confirmed analytical result. Gas chromatography / mass spectrometry (GC/MS) is the preferred confirmat ory method. Performed at [facility], [address] [place],  [address]   Magnesium     Status: None   Collection Time: 11/28/17  2:06 AM  Result Value Ref Range   Magnesium 2.1 1.7 - 2.4 mg/dL    Comment: Performed at [facility],  [place], [place] [address]  Phosphorus     Status: None   Collection Time: 11/28/17  2:06 AM  Result Value Ref Range   Phosphorus 3.8 2.5 - 4.6 mg/dL    Comment: Performed at [facility], [address] [place], [place] [address]  APTT     Status: None   Collection Time: 11/28/17  2:06 AM  Result Value Ref Range   aPTT 28 24 - 36 seconds    Comment: Performed at [facility], [address] [place], [place] [address]  Protime-INR     Status: None   Collection Time: 11/28/17  2:06 AM  Result Value Ref Range   Prothrombin Time 13.7 11.4 - 15.2 seconds   INR 1.06     Comment: Performed at [facility], [address] [place], [place] [address]  Acetaminophen level     Status: Abnormal   Collection Time: 11/28/17  3:23 AM   Result Value Ref Range   Acetaminophen (Tylenol), Serum <10 (L) 10 - 30 ug/mL    Comment: (NOTE) Therapeutic concentrations vary significantly. A range of 10-30 ug/mL  may be an effective concentration for many patients. However, some  are best treated at concentrations outside of this range. Acetaminophen concentrations >150 ug/mL at 4 hours after ingestion  and >50 ug/mL at 12 hours after ingestion are often associated with  toxic reactions. Performed at [facility], [address] [place], [place] [address]   Ethanol     Status: None   Collection Time: 11/28/17  3:23 AM  Result Value Ref Range   Alcohol, Ethyl (B) <10 <10 mg/dL    Comment: (NOTE) Lowest detectable limit for serum alcohol is 10 mg/dL. For medical purposes only. Performed at [facility], [address] [place], [place] [address]   Salicylate level     Status: None   Collection Time: 11/28/17  3:23 AM  Result Value Ref Range   Salicylate Lvl <3.4 2.8 - 30.0 mg/dL    Comment: HEMOLYSIS AT THIS LEVEL MAY AFFECT RESULT Performed at [facility], [address] [place], [place] [address]   Glucose, capillary     Status: Abnormal   Collection Time: 11/28/17  3:58 AM  Result Value Ref Range   Glucose-Capillary 146 (H) 70 - 99 mg/dL   Comment 1 Notify RN   Draw ABG 1 hour after initiation of ventilator     Status: Abnormal   Collection Time: 11/28/17  5:00 AM  Result Value Ref Range   FIO2 30.00    Delivery systems VENTILATOR    Mode PRESSURE REGULATED VOLUME CONTROL    VT 450.0 mL   Peep/cpap 5.0 cm H20   pH, Arterial 7.23 (L) 7.350 - 7.450   pCO2 arterial 50 (H) 32.0 - 48.0 mmHg   pO2, Arterial 108 83.0 - 108.0 mmHg   Bicarbonate 20.9 20.0 - 28.0 mmol/L   Acid-base deficit 6.7 (H) 0.0 - 2.0 mmol/L   O2 Saturation 97.2 %   Patient temperature 37.0    Collection site RIGHT RADIAL    Sample type ARTERIAL DRAW    [place] test (pass/fail) PASS PASS   Mechanical Rate 22      Comment: Performed at [facility], [address] [place],  [address]  Basic metabolic panel     Status: Abnormal   Collection Time: 11/28/17  5:55 AM  Result Value Ref Range   Sodium 135 135 - 145 mmol/L  Potassium 6.3 (HH) 3.5 - 5.1 mmol/L    Comment: CRITICAL RESULT CALLED TO, READ BACK BY AND VERIFIED WITH RENEE BABB 11/28/17 @ 0655  MLK    Chloride 104 98 - 111 mmol/L   CO2 23 22 - 32 mmol/L   Glucose, Bld 261 (H) 70 - 99 mg/dL   BUN 24 (H) 6 - 20 mg/dL   Creatinine, Ser 1.46 (H) 0.44 - 1.00 mg/dL   Calcium 8.0 (L) 8.9 - 10.3 mg/dL   GFR calc non Af Amer 43 (L) >60 mL/min   GFR calc Af Amer 50 (L) >60 mL/min    Comment: (NOTE) The eGFR has been calculated using the CKD EPI equation. This calculation has not been validated in all clinical situations. eGFR's persistently <60 mL/min signify possible Chronic Kidney Disease.    Anion gap 8 5 - 15    Comment: Performed at [facility], [address] [place], [place] [address]  CK     Status: None   Collection Time: 11/28/17  5:55 AM  Result Value Ref Range   Total CK 114 38 - 234 U/L    Comment: Performed at [facility], [address] [place], [place] [address]  Glucose, random     Status: Abnormal   Collection Time: 11/28/17  6:45 AM  Result Value Ref Range   Glucose, Bld 189 (H) 70 - 99 mg/dL    Comment: Performed at [facility], [address] [place], [place] [address]  Procalcitonin - Baseline     Status: None   Collection Time: 11/28/17  6:45 AM  Result Value Ref Range   Procalcitonin <0.10 ng/mL    Comment:        Interpretation: PCT (Procalcitonin) <= 0.5 ng/mL: Systemic infection (sepsis) is not likely. Local bacterial infection is possible. (NOTE)       Sepsis PCT Algorithm           Lower Respiratory Tract                                      Infection PCT Algorithm    ----------------------------     ----------------------------         PCT < 0.25  ng/mL                PCT < 0.10 ng/mL         Strongly encourage             Strongly discourage   discontinuation of antibiotics    initiation of antibiotics    ----------------------------     -----------------------------       PCT 0.25 - 0.50 ng/mL            PCT 0.10 - 0.25 ng/mL               OR       >80% decrease in PCT            Discourage initiation of                                            antibiotics      Encourage discontinuation           of antibiotics    ----------------------------     -----------------------------  PCT >= 0.50 ng/mL              PCT 0.26 - 0.50 ng/mL               AND        <80% decrease in PCT             Encourage initiation of                                             antibiotics       Encourage continuation           of antibiotics    ----------------------------     -----------------------------        PCT >= 0.50 ng/mL                  PCT > 0.50 ng/mL               AND         increase in PCT                  Strongly encourage                                      initiation of antibiotics    Strongly encourage escalation           of antibiotics                                     -----------------------------                                           PCT <= 0.25 ng/mL                                                 OR                                        > 80% decrease in PCT                                     Discontinue / Do not initiate                                             antibiotics Performed at [facility], [address] [place], [place] [address]   Glucose, capillary     Status: Abnormal   Collection Time: 11/28/17  6:52 AM  Result Value Ref Range   Glucose-Capillary 169 (H) 70 - 99 mg/dL  MRSA PCR Screening     Status: None   Collection Time: 11/28/17  7:38 AM  Result Value Ref  Range   MRSA by PCR NEGATIVE NEGATIVE    Comment:        The GeneXpert MRSA Assay (FDA approved for NASAL  specimens only), is one component of a comprehensive MRSA colonization surveillance program. It is not intended to diagnose MRSA infection nor to guide or monitor treatment for MRSA infections. Performed at [facility], [address] [place], [place] [address]   Glucose, capillary     Status: Abnormal   Collection Time: 11/28/17  7:47 AM  Result Value Ref Range   Glucose-Capillary 172 (H) 70 - 99 mg/dL  Na and K (sodium & potassium), rand urine     Status: None   Collection Time: 11/28/17  9:04 AM  Result Value Ref Range   Sodium, Ur 88 mmol/L   Potassium Urine 62 mmol/L    Comment: Performed at [facility], [address] [place], [place] [address]  Creatinine, urine, random     Status: None   Collection Time: 11/28/17  9:04 AM  Result Value Ref Range   Creatinine, Urine 123 mg/dL    Comment: Performed at [facility], [address] [place], [place] [address]  Basic metabolic panel     Status: Abnormal   Collection Time: 11/28/17 10:53 AM  Result Value Ref Range   Sodium 138 135 - 145 mmol/L   Potassium 5.0 3.5 - 5.1 mmol/L   Chloride 106 98 - 111 mmol/L   CO2 26 22 - 32 mmol/L   Glucose, Bld 92 70 - 99 mg/dL   BUN 23 (H) 6 - 20 mg/dL   Creatinine, Ser 1.40 (H) 0.44 - 1.00 mg/dL   Calcium 8.7 (L) 8.9 - 10.3 mg/dL   GFR calc non Af Amer 45 (L) >60 mL/min   GFR calc Af Amer 52 (L) >60 mL/min    Comment: (NOTE) The eGFR has been calculated using the CKD EPI equation. This calculation has not been validated in all clinical situations. eGFR's persistently <60 mL/min signify possible Chronic Kidney Disease.    Anion gap 6 5 - 15    Comment: Performed at [facility], [address] [place], [place] [address]  Magnesium     Status: None   Collection Time: 11/28/17 10:53 AM  Result Value Ref Range   Magnesium 2.0 1.7 - 2.4 mg/dL    Comment: Performed at [facility], [address] [place], [place] [address]   Phosphorus     Status: None   Collection Time: 11/28/17 10:53 AM  Result Value Ref Range   Phosphorus 3.8 2.5 - 4.6 mg/dL    Comment: Performed at [facility], [address] [place], [place] [address]  Glucose, capillary     Status: Abnormal   Collection Time: 11/28/17 11:11 AM  Result Value Ref Range   Glucose-Capillary 116 (H) 70 - 99 mg/dL    Current Facility-Administered Medications  Medication Dose Route Frequency Provider Last Rate Last Dose  . 0.9 %  sodium chloride infusion   Intravenous Continuous Cassandria Santee, MD 75 mL/hr at 11/28/17 1510    . Ampicillin-Sulbactam (UNASYN) 3 g in sodium chloride 0.9 % 100 mL IVPB  3 g Intravenous Q6H Sridharan, Prasanna, MD 200 mL/hr at 11/28/17 1615 3 g at 11/28/17 1615  . heparin injection 5,000 Units  5,000 Units Subcutaneous Q8H Arta Silence, MD   5,000 Units at 11/28/17 1509  . MEDLINE mouth rinse  15 mL Mouth Rinse BID Cassandria Santee, MD   15  mL at 11/28/17 1047  . [START ON 11/29/2017] pantoprazole (PROTONIX) EC tablet 40 mg  40 mg Oral Daily Bettey Costa, MD        Musculoskeletal: Strength & Muscle Tone: within normal limits Gait & Station: normal Patient leans: N/A  Psychiatric Specialty Exam: Physical Exam  Nursing note and vitals reviewed. Constitutional: She appears well-developed and well-nourished.  HENT:  Head: Normocephalic and atraumatic.  Eyes: Pupils are equal, round, and reactive to light. Conjunctivae are normal.  Neck: Normal range of motion.  Cardiovascular: Regular rhythm and normal heart sounds.  Respiratory: Effort normal. No respiratory distress.  GI: Soft.  Musculoskeletal: Normal range of motion.  Neurological: She is alert.  Skin: Skin is warm and dry.  Psychiatric: Her mood appears anxious. Her affect is labile. Her speech is tangential. She is agitated. She is not aggressive. Thought content is not paranoid. Cognition and memory are impaired. She expresses impulsivity and  inappropriate judgment. She expresses no homicidal and no suicidal ideation. She is noncommunicative.    Review of Systems  Constitutional: Negative.   HENT: Negative.   Eyes: Negative.   Respiratory: Negative.   Cardiovascular: Negative.   Gastrointestinal: Positive for abdominal pain.  Musculoskeletal: Negative.   Skin: Negative.   Neurological: Negative.   Psychiatric/Behavioral: Negative for depression, hallucinations, substance abuse and suicidal ideas. The patient is nervous/anxious.     Blood pressure (!) 140/104, pulse 79, temperature 98.2 F (36.8 C), resp. rate (!) 22, height 5' 7"  (1.702 m), weight 98.1 kg, SpO2 96 %.Body mass index is 33.87 kg/m.  General Appearance: Casual  Eye Contact:  Minimal  Speech:  Garbled  Volume:  Decreased  Mood:  Anxious, Dysphoric and Irritable  Affect:  Constricted  Thought Process:  Disorganized  Orientation:  Full (Time, Place, and Person)  Thought Content:  Tangential  Suicidal Thoughts:  No  Homicidal Thoughts:  No  Memory:  Immediate;   Fair Recent;   Poor Remote;   Fair  Judgement:  Impaired  Insight:  Shallow  Psychomotor Activity:  Restlessness  Concentration:  Concentration: Poor  Recall:  Poor  Fund of Knowledge:  Fair  Language:  Fair  Akathisia:  No  Handed:  Right  AIMS (if indicated):     Assets:  Desire for Improvement Resilience  ADL's:  Impaired  Cognition:  Impaired,  Mild  Sleep:        Treatment Plan Summary: Plan Patient brought into the hospital after an overdose of unclear content.  Drug screen not really revealing.  Patient denies having taken any extra medicine.  She was somatically preoccupied and agitated this afternoon and difficult to interview but does not appear to be having a major depression does not appear to be suicidal or at risk of harming herself.  She is not on involuntary commitment and does not need to be.  No need for any acute change to psychiatric medicines.  I will follow-up if  needed in the hospital.  Disposition: No evidence of imminent risk to self or others at present.   Patient does not meet criteria for psychiatric inpatient admission. Supportive therapy provided about ongoing stressors.  Alethia Berthold, MD 11/28/2017 4:24 PM

## 2017-11-28 NOTE — Progress Notes (Signed)
Pharmacy Antibiotic Note  Glennette Galster is a 42 y.o. female admitted on 11/28/2017 with aspiration pneumonia.  Pharmacy has been consulted for Unasyn dosing.  Plan: Will start Unasyn 3g IV q6h  Height: 5\' 7"  (170.2 cm) Weight: 216 lb 4.3 oz (98.1 kg) IBW/kg (Calculated) : 61.6  Temp (24hrs), Avg:98.1 F (36.7 C), Min:98.1 F (36.7 C), Max:98.1 F (36.7 C)  Recent Labs  Lab 11/28/17 0206  WBC 16.7*  CREATININE 1.68*    Estimated Creatinine Clearance: 51.9 mL/min (A) (by C-G formula based on SCr of 1.68 mg/dL (H)).    Allergies  Allergen Reactions  . Cefpodoxime   . Clarithromycin   . Doxycycline   . Ferrous Gluconate   . Levofloxacin   . Rizatriptan   . Sulfa Antibiotics   . Topiramate   . Venlafaxine   . Zofran [Ondansetron Hcl]     Thank you for allowing pharmacy to be a part of this patient's care.  Thomasene Ripple, PharmD, BCPS Clinical Pharmacist 11/28/2017

## 2017-11-28 NOTE — Progress Notes (Signed)
Pt assisted up to bedside commode. Pt calmer at this time. States pain has improved. Continues to c/o some gas pain, but pt states it has improved.

## 2017-11-28 NOTE — Progress Notes (Signed)
Patient is hollering out c/o abd pain. NP at bedside assessing patient.

## 2017-11-28 NOTE — Progress Notes (Signed)
Patient admitted this morning due to drug overdoses and intubated.  Agree with intensivist consultation and admitting MD as well as nephrology.

## 2017-11-28 NOTE — ED Notes (Signed)
50 mcg of propofol bolus given at this time per verbal order Dr. Lamont Snowball

## 2017-11-28 NOTE — ED Notes (Signed)
Report is given to the ICU nurse

## 2017-11-29 ENCOUNTER — Inpatient Hospital Stay: Payer: Self-pay

## 2017-11-29 DIAGNOSIS — K858 Other acute pancreatitis without necrosis or infection: Secondary | ICD-10-CM

## 2017-11-29 DIAGNOSIS — E781 Pure hyperglyceridemia: Secondary | ICD-10-CM

## 2017-11-29 LAB — COMPREHENSIVE METABOLIC PANEL
ALBUMIN: 3.3 g/dL — AB (ref 3.5–5.0)
ALT: 10 U/L (ref 0–44)
AST: 20 U/L (ref 15–41)
Alkaline Phosphatase: 69 U/L (ref 38–126)
Anion gap: 10 (ref 5–15)
BUN: 15 mg/dL (ref 6–20)
CHLORIDE: 104 mmol/L (ref 98–111)
CO2: 23 mmol/L (ref 22–32)
CREATININE: 1.08 mg/dL — AB (ref 0.44–1.00)
Calcium: 8.2 mg/dL — ABNORMAL LOW (ref 8.9–10.3)
GFR calc Af Amer: >60 mL/min (ref >60)
GFR calc non Af Amer: >60 mL/min (ref >60)
GLUCOSE: 162 mg/dL — AB (ref 70–99)
POTASSIUM: 4.1 mmol/L (ref 3.5–5.1)
Sodium: 137 mmol/L (ref 135–145)
Total Bilirubin: 0.6 mg/dL (ref 0.3–1.2)
Total Protein: 6.2 g/dL — ABNORMAL LOW (ref 6.5–8.1)

## 2017-11-29 LAB — CBC
HEMATOCRIT: 36.2 % (ref 36.0–46.0)
HEMOGLOBIN: 10.9 g/dL — AB (ref 12.0–15.0)
MCH: 24.7 pg — AB (ref 26.0–34.0)
MCHC: 30.1 g/dL (ref 30.0–36.0)
MCV: 82.1 fL (ref 80.0–100.0)
Platelets: 259 10*3/uL (ref 150–400)
RBC: 4.41 MIL/uL (ref 3.87–5.11)
RDW: 14.9 % (ref 11.5–15.5)
WBC: 14.8 10*3/uL — AB (ref 4.0–10.5)
nRBC: 0 % (ref 0.0–0.2)

## 2017-11-29 LAB — GLUCOSE, CAPILLARY
GLUCOSE-CAPILLARY: 121 mg/dL — AB (ref 70–99)
Glucose-Capillary: 119 mg/dL — ABNORMAL HIGH (ref 70–99)
Glucose-Capillary: 104 mg/dL — ABNORMAL HIGH (ref 70–99)

## 2017-11-29 LAB — CALCIUM, IONIZED
CALCIUM, IONIZED, SERUM: 4.3 mg/dL — AB (ref 4.5–5.6)
Calcium, Ionized, Serum: 4.2 mg/dL — ABNORMAL LOW (ref 4.5–5.6)

## 2017-11-29 LAB — LIPASE, BLOOD: LIPASE: 169 U/L — AB (ref 11–51)

## 2017-11-29 LAB — TRIGLYCERIDES: Triglycerides: 632 mg/dL — ABNORMAL HIGH (ref <150)

## 2017-11-29 LAB — PROCALCITONIN

## 2017-11-29 LAB — HIV ANTIBODY (ROUTINE TESTING W REFLEX): HIV Screen 4th Generation wRfx: NONREACTIVE

## 2017-11-29 LAB — UREA NITROGEN, URINE: Urea Nitrogen, Ur: 447 mg/dL

## 2017-11-29 MED ORDER — TIZANIDINE HCL 4 MG PO TABS
4.0000 mg | ORAL_TABLET | Freq: Three times a day (TID) | ORAL | Status: DC | PRN
  Administered 2017-11-29: 4 mg via ORAL
  Filled 2017-11-29 (×2): qty 1

## 2017-11-29 MED ORDER — CLONAZEPAM 1 MG PO TABS: 1.0000 mg | ORAL_TABLET | Freq: Three times a day (TID) | ORAL | Status: DC | PRN

## 2017-11-29 MED ORDER — HYDRALAZINE HCL 20 MG/ML IJ SOLN
10.0000 mg | Freq: Four times a day (QID) | INTRAMUSCULAR | Status: DC | PRN
  Administered 2017-12-02 – 2017-12-03 (×2): 10 mg via INTRAVENOUS
  Filled 2017-11-29 (×2): qty 1

## 2017-11-29 MED ORDER — LISINOPRIL 20 MG PO TABS
40.0000 mg | ORAL_TABLET | Freq: Every day | ORAL | Status: DC
  Administered 2017-11-29 – 2017-12-04 (×6): 40 mg via ORAL
  Filled 2017-11-29 (×7): qty 2

## 2017-11-29 MED ORDER — SENNA 8.6 MG PO TABS
1.0000 | ORAL_TABLET | Freq: Every day | ORAL | Status: DC | PRN
  Administered 2017-11-29: 8.6 mg via ORAL
  Filled 2017-11-29: qty 1

## 2017-11-29 MED ORDER — POLYETHYLENE GLYCOL 3350 17 G PO PACK
17.0000 g | PACK | Freq: Every day | ORAL | Status: DC
  Administered 2017-11-30: 17 g via ORAL
  Filled 2017-11-29 (×2): qty 1

## 2017-11-29 MED ORDER — FLUOXETINE HCL 20 MG PO CAPS
40.0000 mg | ORAL_CAPSULE | Freq: Every day | ORAL | Status: DC
  Administered 2017-11-30 – 2017-12-04 (×3): 40 mg via ORAL
  Filled 2017-11-29 (×6): qty 2

## 2017-11-29 MED ORDER — LACTATED RINGERS IV SOLN
INTRAVENOUS | Status: DC
  Administered 2017-11-29 – 2017-11-30 (×3): via INTRAVENOUS

## 2017-11-29 MED ORDER — CARVEDILOL 25 MG PO TABS
25.0000 mg | ORAL_TABLET | Freq: Two times a day (BID) | ORAL | Status: DC
  Administered 2017-11-30 – 2017-12-04 (×7): 25 mg via ORAL
  Filled 2017-11-29 (×8): qty 1

## 2017-11-29 MED ORDER — FENTANYL CITRATE (PF) 100 MCG/2ML IJ SOLN
12.5000 ug | INTRAMUSCULAR | Status: DC | PRN
  Administered 2017-11-29 – 2017-11-30 (×5): 12.5 ug via INTRAVENOUS
  Filled 2017-11-29 (×5): qty 2

## 2017-11-29 MED ORDER — PROPRANOLOL HCL 20 MG PO TABS: 80.0000 mg | ORAL_TABLET | Freq: Two times a day (BID) | ORAL | Status: DC

## 2017-11-29 MED ORDER — MIRTAZAPINE 15 MG PO TABS
30.0000 mg | ORAL_TABLET | Freq: Every day | ORAL | Status: DC
  Administered 2017-11-29 – 2017-12-03 (×5): 30 mg via ORAL
  Filled 2017-11-29 (×5): qty 2

## 2017-11-29 MED ORDER — ACETAMINOPHEN 325 MG PO TABS
650.0000 mg | ORAL_TABLET | Freq: Four times a day (QID) | ORAL | Status: DC | PRN
  Administered 2017-11-29 – 2017-12-04 (×6): 650 mg via ORAL
  Filled 2017-11-29 (×6): qty 2

## 2017-11-29 MED ORDER — LACTATED RINGERS IV BOLUS
1000.0000 mL | Freq: Once | INTRAVENOUS | Status: AC
  Administered 2017-11-29: 1000 mL via INTRAVENOUS

## 2017-11-29 MED ORDER — PREGABALIN 75 MG PO CAPS
200.0000 mg | ORAL_CAPSULE | Freq: Three times a day (TID) | ORAL | Status: DC
  Administered 2017-11-29 – 2017-12-04 (×15): 200 mg via ORAL
  Filled 2017-11-29 (×17): qty 1

## 2017-11-29 NOTE — Progress Notes (Signed)
[name], rates pain 10/10. Patient was requesting a kpad, medication for gas, constipation, and pancreas.Clarified with patient what medications she was able to have and reassured her that the NP would order any other medications needed. Patient also requesting to get up to [facility] and try to have BM. Pt assisted up to [facility] with no results but was passing gas. Patient assisted back to bed and warm blanket applied to patients abd and back.

## 2017-11-29 NOTE — Progress Notes (Signed)
Patient continues to c/o abd whenever awake, does dose back off to sleep after needs met. Patient has been up to [facility] X's 2 this shift in effort to try and have a BM with no success. Patient does pass gas but unable to move bowels. Sennakot PRN given per order. Maggie,NP ordered abd [place] for this morning but per [place] patient has to be NPO for abd [place]. Patient made NPO this morning and [place] will be here around lunchtime to complete per [place]. Patient is currently with eyes closed.

## 2017-11-29 NOTE — Progress Notes (Signed)
PT Cancellation Note  Patient Details Name: Terri Berry MRN: [id number] DOB: October 25, 1975   Cancelled Treatment:    Reason Eval/Treat Not Completed: Other (comment);Medical issues which prohibited therapy. Will try again at another time.  Has high diastolic BP and will recheck to see how pt is progressing.   Ivar Drape 11/29/2017, 2:44 PM   Samul Dada, PT MS Acute Rehab Dept. Number: [facility] [id number] and [facility] [phone]

## 2017-11-29 NOTE — Progress Notes (Signed)
Name: Terri Berry MRN: [id number] DOB: [birth year]     CONSULTATION DATE: 11/28/2017  Subjective & objectives: Extubated on 11/28/2017 tolerating room air and has been cleared by psych  PAST MEDICAL HISTORY :   has a past medical history of Hypertension and IBS (irritable bowel syndrome).  has no past surgical history on file. Prior to Admission medications   Medication Sig Start Date End Date Taking? Authorizing Provider  Alpha-D-Galactosidase (BEANO) TABS Take 100 mg by mouth as needed.   Yes [provider]  atorvastatin (LIPITOR) 40 MG tablet Take 40 mg by mouth daily.   Yes [provider]  carvedilol (COREG) 25 MG tablet Take 25 mg by mouth 2 (two) times daily with a meal.   Yes [provider]  chlorthalidone (HYGROTON) 25 MG tablet Take 25 mg by mouth daily.   Yes [provider]  clonazePAM (KLONOPIN) 1 MG tablet Take 1 mg by mouth 3 (three) times daily as needed for anxiety.   Yes [provider]  FLUoxetine (PROZAC) 20 MG capsule Take 40 mg by mouth daily.   Yes [provider]  fluticasone (FLONASE) 50 MCG/ACT nasal spray Place 2 sprays into both nostrils 2 (two) times daily.   Yes [provider]  ibuprofen (ADVIL,MOTRIN) 200 MG tablet Take 200 mg by mouth every 6 (six) hours as needed.   Yes [provider]  lactase (LACTAID) 3000 units tablet Take 9,000 Units by mouth 3 (three) times daily with meals.   Yes [provider]  lisinopril (PRINIVIL,ZESTRIL) 20 MG tablet Take 40 mg by mouth daily.    Yes [provider]  Melatonin 10 MG TABS Take 10 mg by mouth at bedtime.   Yes [provider]  mirtazapine (REMERON) 30 MG tablet Take 30 mg by mouth at bedtime.   Yes [provider]  omeprazole (PRILOSEC) 20 MG capsule Take 40 mg by mouth 2 (two) times daily before a meal.   Yes [provider]  Pancrelipase, Lip-Prot-Amyl, 24000-76000 units CPEP Take  24,000-48,000 Units by mouth 3 (three) times daily before meals. Take 2 capsules by mouth three times daily with meals and 1 capsule by mouth with snacks.   Yes [provider]  polyethylene glycol (MIRALAX / GLYCOLAX) packet Take 17 g by mouth 2 (two) times daily.   Yes [provider]  pregabalin (LYRICA) 200 MG capsule Take 200 mg by mouth 3 (three) times daily.   Yes [provider]  prochlorperazine (COMPAZINE) 10 MG tablet Take 10 mg by mouth daily.   Yes [provider]  promethazine (PHENERGAN) 12.5 MG tablet Take 12.5 mg by mouth every evening.   Yes [provider]  propranolol (INDERAL) 80 MG tablet Take 80 mg by mouth 2 (two) times daily.   Yes [provider]  senna (SENOKOT) 8.6 MG TABS tablet Take 4 tablets by mouth at bedtime.   Yes [provider]  Simethicone 180 MG CAPS Take 1 capsule by mouth as needed.   Yes [provider]  tiZANidine (ZANAFLEX) 4 MG tablet Take 4 mg by mouth every 8 (eight) hours as needed for muscle spasms.   Yes [provider]  famotidine (PEPCID) 20 MG tablet Take 1 tablet (20 mg total) by mouth 2 (two) times daily. Patient not taking: Reported on 11/28/2017 10/31/17   Irean Hong, MD  oxyCODONE (OXY IR/ROXICODONE) 5 MG immediate release tablet Take 1 tablet (5 mg total) by mouth every 4 (four) hours  as needed for severe pain. Patient not taking: Reported on 11/28/2017 10/31/17   Irean Hong, MD  promethazine (PHENERGAN) 25 MG tablet Take 1 tablet (25 mg total) by mouth every 6 (six) hours as needed for nausea or vomiting. Patient not taking: Reported on 11/28/2017 10/31/17   Irean Hong, MD   Allergies  Allergen Reactions  . Cefpodoxime   . Clarithromycin   . Doxycycline   . Ferrous Gluconate   . Levofloxacin   . Rizatriptan   . Sulfa Antibiotics   . Topiramate   . Venlafaxine   . Zofran [Ondansetron Hcl]     FAMILY HISTORY:  family history is not on  file. SOCIAL HISTORY:  reports that she has been smoking. She has never used smokeless tobacco. She reports that she drank alcohol.  REVIEW OF SYSTEMS:   Unable to obtain due to critical illness   VITAL SIGNS: Temp:  [98.2 F (36.8 C)-98.5 F (36.9 C)] 98.2 F (36.8 C) (10/26 0800) Pulse Rate:  [70-117] 109 (10/26 0900) Resp:  [10-28] 18 (10/26 0900) BP: (96-165)/(63-115) 134/104 (10/26 0900) SpO2:  [91 %-100 %] 96 % (10/26 0900) Weight:  [95.4 kg] 95.4 kg (10/26 0419)  Physical Examination:  Awake and oriented no acute neurological deficits On room air, able to talk in full sentences, no distress, bilateral equal air entry with no adventitious sounds S1 & S2 are audible with no murmur Benign obese abdomen with feeble peristalsis No leg edema  ASSESSMENT / PLAN: Acute respiratory failure (improved).  Extubated on 11/28/2017 and has been tolerating room air -Monitor work of breathing and oxygen saturation  Altered mental status with drug overdose (resolved).  UDS positive for tricyclics.  Questionable suicidal attempt. -She has been cleared by psych.  AKI with hyperkalemia , hyponatremia(improved).  Unremarkable renal ultrasound  Atelectasis and aspiration pneumonia.  Bibasilar airspace disease with pulmonary congestion and borderline cardiomegaly -Empiric Unasyn.  Monitor CXR + CBC + FiO2  Left atrial enlargement with bimodal P wave on V1 and borderline cardiomegaly on chest x-ray -Echocardiogram  Elevated lipase and amylase was possible pancreatitis -PPI monitor pancreatic enzymes -Abdominal ultrasound and follow with GI evaluation  Anemia -Keep hemoglobin more than 7 g/dL  Full code  DVT & GI prophylaxis.  Continue supportive care

## 2017-11-29 NOTE — Progress Notes (Signed)
Spoke with NP concerning multiple patient requests this shift. New orders received for Miralax scheduled. Tylenol and sennakot ordere PRN. Pt currently resting with eyes closed. No c/o pain at present.

## 2017-11-29 NOTE — Progress Notes (Signed)
Pharmacy Antibiotic Note  Terri Berry is a 42 y.o. female admitted on 11/28/2017 found unresponsive with suspected TCA overdose. Patient is currently being treated for aspiration pneumonia and hyperkalemia. Pharmacy has been consulted for Unasyn dosing. Patient is now extubated.   Plan: Continue Unasyn IV 3 g q6h.   Will obtain procalcitonin levels. If therapy remains warranted, recommend treating for 5 full days.   Height: 5\' 7"  (170.2 cm) Weight: 210 lb 5.1 oz (95.4 kg) IBW/kg (Calculated) : 61.6  Temp (24hrs), Avg:98.5 F (36.9 C), Min:98.2 F (36.8 C), Max:98.7 F (37.1 C)  Recent Labs  Lab 11/28/17 0206 11/28/17 0555 11/28/17 1053 11/28/17 2039 11/29/17 0433 11/29/17 0438  WBC 16.7*  --   --   --   --  14.8*  CREATININE 1.68* 1.46* 1.40* 1.05*  1.05* 1.08*  --     Estimated Creatinine Clearance: 79.6 mL/min (A) (by C-G formula based on SCr of 1.08 mg/dL (H)).    Allergies  Allergen Reactions  . Cefpodoxime   . Clarithromycin   . Doxycycline   . Ferrous Gluconate   . Levofloxacin   . Rizatriptan   . Sulfa Antibiotics   . Topiramate   . Venlafaxine   . Zofran [Ondansetron Hcl]    Antimicrobials this admission: 10/25 Unasyn >>   Dose adjustments this admission:   Microbiology results: 10/25 MRSA PCR: (-)  Thank you for allowing pharmacy to be a part of this patient's care.  Bari Mantis PharmD Clinical Pharmacist 11/29/2017

## 2017-11-29 NOTE — Consult Note (Signed)
Cephas Darby, MD [address]  [place]  [place], [place] [address]  Main: [phone]  Fax: [phone] Pager: [phone]   Consultation  Referring Provider:     No ref. provider found Primary Care Physician:  Joanie Coddington, MD Primary Gastroenterologist: None         Reason for Consultation:     Acute pancreatitis  Date of Admission:  11/28/2017 Date of Consultation:  11/29/2017         HPI:   Terri Berry is a 42 y.o. female with history of chronic tobacco use, anxiety on Xanax, obesity, hypertension, hyperlipidemia who was admitted to ICU yesterday due to altered mental status from possible drug overdose, temporarily intubated.  Labs revealed elevated lipase.  Patient was extubated this morning and started complaining of severe upper abdominal pain.  Therefore she underwent CT scan which revealed inflammatory changes surrounding the pancreatic head and uncinate process.  There was no evidence of fluid collection.  Therefore, GI is consulted for further evaluation  Apparently, patient was admitted to [facility] earlier this month due to acute pancreatitis.  Her triglycerides were 729 at that time. She said her symptoms improved.  But, for the past 2 days epigastric pain worse and associated with nausea and vomiting.  She also reports history of severe constipation for which she takes MiraLAX and Senokot as needed.  She does smoke tobacco regularly.  Denies alcohol use   NSAIDs: None  Antiplts/Anticoagulants/Anti thrombotics: None  GI Procedures: Colonoscopy in 2009, reports not available  Past Medical History:  Diagnosis Date  . Hypertension   . IBS (irritable bowel syndrome)     History reviewed. No pertinent surgical history.  Prior to Admission medications   Medication Sig Start Date End Date Taking? Authorizing Provider  Alpha-D-Galactosidase (BEANO) TABS Take 100 mg by mouth as needed.   Yes [provider]  atorvastatin (LIPITOR) 40 MG tablet  Take 40 mg by mouth daily.   Yes [provider]  carvedilol (COREG) 25 MG tablet Take 25 mg by mouth 2 (two) times daily with a meal.   Yes [provider]  chlorthalidone (HYGROTON) 25 MG tablet Take 25 mg by mouth daily.   Yes [provider]  clonazePAM (KLONOPIN) 1 MG tablet Take 1 mg by mouth 3 (three) times daily as needed for anxiety.   Yes [provider]  FLUoxetine (PROZAC) 20 MG capsule Take 40 mg by mouth daily.   Yes [provider]  fluticasone (FLONASE) 50 MCG/ACT nasal spray Place 2 sprays into both nostrils 2 (two) times daily.   Yes [provider]  ibuprofen (ADVIL,MOTRIN) 200 MG tablet Take 200 mg by mouth every 6 (six) hours as needed.   Yes [provider]  lactase (LACTAID) 3000 units tablet Take 9,000 Units by mouth 3 (three) times daily with meals.   Yes [provider]  lisinopril (PRINIVIL,ZESTRIL) 20 MG tablet Take 40 mg by mouth daily.    Yes [provider]  Melatonin 10 MG TABS Take 10 mg by mouth at bedtime.   Yes [provider]  mirtazapine (REMERON) 30 MG tablet Take 30 mg by mouth at bedtime.   Yes [provider]  omeprazole (PRILOSEC) 20 MG capsule Take 40 mg by mouth 2 (two) times daily before a meal.   Yes [provider]  Pancrelipase, Lip-Prot-Amyl, 24000-76000 units CPEP Take 24,000-48,000 Units by mouth 3 (three) times daily before meals. Take 2 capsules by mouth three  times daily with meals and 1 capsule by mouth with snacks.   Yes [provider]  polyethylene glycol (MIRALAX / GLYCOLAX) packet Take 17 g by mouth 2 (two) times daily.   Yes [provider]  pregabalin (LYRICA) 200 MG capsule Take 200 mg by mouth 3 (three) times daily.   Yes [provider]  prochlorperazine (COMPAZINE) 10 MG tablet Take 10 mg by mouth daily.   Yes [provider]  promethazine (PHENERGAN) 12.5 MG tablet Take 12.5 mg by mouth every  evening.   Yes [provider]  propranolol (INDERAL) 80 MG tablet Take 80 mg by mouth 2 (two) times daily.   Yes [provider]  senna (SENOKOT) 8.6 MG TABS tablet Take 4 tablets by mouth at bedtime.   Yes [provider]  Simethicone 180 MG CAPS Take 1 capsule by mouth as needed.   Yes [provider]  tiZANidine (ZANAFLEX) 4 MG tablet Take 4 mg by mouth every 8 (eight) hours as needed for muscle spasms.   Yes [provider]  famotidine (PEPCID) 20 MG tablet Take 1 tablet (20 mg total) by mouth 2 (two) times daily. Patient not taking: Reported on 11/28/2017 10/31/17   Paulette Blanch, MD  oxyCODONE (OXY IR/ROXICODONE) 5 MG immediate release tablet Take 1 tablet (5 mg total) by mouth every 4 (four) hours as needed for severe pain. Patient not taking: Reported on 11/28/2017 10/31/17   Paulette Blanch, MD  promethazine (PHENERGAN) 25 MG tablet Take 1 tablet (25 mg total) by mouth every 6 (six) hours as needed for nausea or vomiting. Patient not taking: Reported on 11/28/2017 10/31/17   Paulette Blanch, MD    Current Facility-Administered Medications:  .  acetaminophen (TYLENOL) tablet 650 mg, 650 mg, Oral, Q6H PRN, Tukov-Yual, Magdalene S, NP, 650 mg at 11/29/17 1301 .  Ampicillin-Sulbactam (UNASYN) 3 g in sodium chloride 0.9 % 100 mL IVPB, 3 g, Intravenous, Q6H, Tukov-Yual, Magdalene S, NP, Stopped at 11/29/17 1337 .  carvedilol (COREG) tablet 25 mg, 25 mg, Oral, BID WC, Mody, Sital, MD .  clonazePAM (KLONOPIN) tablet 0.5 mg, 0.5 mg, Oral, TID PRN, Tukov-Yual, Magdalene S, NP, 0.5 mg at 11/29/17 1507 .  diphenhydrAMINE (BENADRYL) injection 25 mg, 25 mg, Intravenous, Q6H PRN, Tukov-Yual, Magdalene S, NP, 25 mg at 11/28/17 2013 .  fentaNYL (SUBLIMAZE) injection 12.5 mcg, 12.5 mcg, Intravenous, Q4H PRN, Soyla Murphy, Maged, MD, 12.5 mcg at 11/29/17 1507 .  FLUoxetine (PROZAC) capsule 40 mg, 40 mg, Oral, Daily, Mody, Sital, MD .  hydrALAZINE (APRESOLINE) injection 10 mg,  10 mg, Intravenous, Q6H PRN, Mody, Sital, MD .  ketorolac (TORADOL) 15 MG/ML injection 15 mg, 15 mg, Intravenous, Q6H PRN, Tukov-Yual, Magdalene S, NP, 15 mg at 11/29/17 1507 .  lactated ringers infusion, , Intravenous, Continuous, Saundra Gin, Tally Due, MD, Last Rate: 150 mL/hr at 11/29/17 1509 .  lisinopril (PRINIVIL,ZESTRIL) tablet 40 mg, 40 mg, Oral, Daily, Mody, Sital, MD, 40 mg at 11/29/17 1301 .  MEDLINE mouth rinse, 15 mL, Mouth Rinse, BID, Tukov-Yual, Magdalene S, NP, 15 mL at 11/28/17 1047 .  Melatonin TABS 10 mg, 10 mg, Oral, QHS, Tukov-Yual, Magdalene S, NP, 10 mg at 11/28/17 2319 .  mirtazapine (REMERON) tablet 30 mg, 30 mg, Oral, QHS, Mody, Sital, MD .  nicotine (NICODERM CQ - dosed in mg/24 hours) patch 14 mg, 14 mg, Transdermal, Daily, Tukov-Yual, Magdalene S, NP, 14 mg at 11/29/17 1303 .  pantoprazole (PROTONIX) EC tablet 40 mg, 40 mg,  Oral, Daily, Tukov-Yual, Magdalene S, NP, 40 mg at 11/29/17 1300 .  polyethylene glycol (MIRALAX / GLYCOLAX) packet 17 g, 17 g, Oral, Daily, Tukov-Yual, Magdalene S, NP .  pregabalin (LYRICA) capsule 200 mg, 200 mg, Oral, TID, Samaan, Maged, MD, 200 mg at 11/29/17 1507 .  senna (SENOKOT) tablet 8.6 mg, 1 tablet, Oral, Daily PRN, Tukov-Yual, Magdalene S, NP, 8.6 mg at 11/29/17 0454 .  simethicone (MYLICON) 40 JO/8.4ZY suspension 80 mg, 80 mg, Oral, QID PRN, Tukov-Yual, Magdalene S, NP, 80 mg at 11/29/17 1309 .  tiZANidine (ZANAFLEX) tablet 4 mg, 4 mg, Oral, Q8H PRN, Benjie Karvonen, Sital, MD, 4 mg at 11/29/17 1507  History reviewed. No pertinent family history.   Social History   Tobacco Use  . Smoking status: Current Every Day Smoker  . Smokeless tobacco: Never Used  Substance Use Topics  . Alcohol use: Not Currently  . Drug use: Not on file    Allergies as of 11/28/2017 - Review Complete 11/28/2017  Allergen Reaction Noted  . Cefpodoxime  10/31/2017  . Clarithromycin  10/31/2017  . Doxycycline  10/31/2017  . Ferrous gluconate  10/31/2017  .  Levofloxacin  10/31/2017  . Rizatriptan  10/31/2017  . Sulfa antibiotics  10/31/2017  . Topiramate  10/31/2017  . Venlafaxine  10/31/2017  . Zofran [ondansetron hcl]  10/31/2017    Review of Systems:    All systems reviewed and negative except where noted in HPI.   Physical Exam:  Vital signs in last 24 hours: Temp:  [98.2 F (36.8 C)-98.7 F (37.1 C)] 98.7 F (37.1 C) (10/26 1200) Pulse Rate:  [78-117] 100 (10/26 1400) Resp:  [12-28] 21 (10/26 1400) BP: (130-165)/(89-115) 130/105 (10/26 1400) SpO2:  [92 %-99 %] 97 % (10/26 1400) Weight:  [95.4 kg] 95.4 kg (10/26 0419) Last BM Date: 11/27/17 General:   Pleasant, cooperative in NAD Head:  Normocephalic and atraumatic. Eyes:   No icterus.   Conjunctiva pink. PERRLA. Ears:  Normal auditory acuity. Neck:  Supple; no masses or thyroidomegaly Lungs: Respirations even and unlabored. Lungs clear to auscultation bilaterally.   No wheezes, crackles, or rhonchi.  Heart:  Regular rate and rhythm;  Without murmur, clicks, rubs or gallops Abdomen:  Soft, obese, nondistended, moderate epigastric tenderness. Normal bowel sounds. No appreciable masses or hepatomegaly.  No rebound or guarding.  Rectal:  Not performed. Msk:  Symmetrical without gross deformities.  Strength weak  Extremities:  Without edema, cyanosis or clubbing. Neurologic:  Alert and oriented x3;  grossly normal neurologically. Skin:  Intact without significant lesions or rashes. Cervical Nodes:  No significant cervical adenopathy. Psych:  Alert and cooperative. Normal affect.  LAB RESULTS: CBC Latest Ref Rng & Units 11/29/2017 11/28/2017 11/15/2017  WBC 4.0 - 10.5 K/uL 14.8(H) 16.7(H) 13.1(H)  Hemoglobin 12.0 - 15.0 g/dL 10.9(L) 11.7(L) 12.2  Hematocrit 36.0 - 46.0 % 36.2 38.8 39.3  Platelets 150 - 400 K/uL 259 354 316    BMET BMP Latest Ref Rng & Units 11/29/2017 11/28/2017 11/28/2017  Glucose 70 - 99 mg/dL 162(H) 128(H) 125(H)  BUN 6 - 20 mg/dL _0 Creatinine 0.44 - 1.00 mg/dL 1.08(H) 1.05(H) 1.05(H)  Sodium 135 - 145 mmol/L 137 139 137  Potassium 3.5 - 5.1 mmol/L 4.1 4.7 4.7  Chloride 98 - 111 mmol/L 104 105 103  CO2 22 - 32 mmol/L _1 Calcium 8.9 - 10.3 mg/dL 8.2(L) 8.7(L) 8.6(L)    LFT Hepatic Function Latest Ref Rng & Units 11/29/2017 11/28/2017  11/28/2017  Total Protein 6.5 - 8.1 g/dL 6.2(L) 7.7 7.1  Albumin 3.5 - 5.0 g/dL 3.3(L) 4.1 3.9  AST 15 - 41 U/L 20 17 21  ALT 0 - 44 U/L 10 14 10  Alk Phosphatase 38 - 126 U/L 69 91 81  Total Bilirubin 0.3 - 1.2 mg/dL 0.6 0.4 0.4     STUDIES: Ct Abdomen Pelvis Wo Contrast  Result Date: 11/29/2017 CLINICAL DATA:  [name], rates pain 10/10. Patient was requesting a kpad, medication for gas, constipation, and pancreas.Clarified with patient what medications she was able to have and reassured her that the NP would order any other medications needed. Patient also requesting to get up to BSC and try to have BM. Pt assisted up to BSC with no results but was passing gas. Patient assisted back to bed and warm blanket applied to patients abd and back. EXAM: CT ABDOMEN AND PELVIS WITHOUT CONTRAST TECHNIQUE: Multidetector CT imaging of the abdomen and pelvis was performed following the standard protocol without IV contrast. COMPARISON:  None. FINDINGS: Lower chest: Dependent lower lobe opacity bilaterally consistent with atelectasis. Heart is normal in size. Hepatobiliary: Liver mildly enlarged with diffusely decreased attenuation. No liver mass or focal lesion. Gallbladder surgically absent. No bile duct dilation. Pancreas: Inflammatory changes surround the pancreatic head and uncinate process, tracking along the anterior pararenal fascia, mostly on the right and extending along the gastrohepatic ligament and root of the small bowel mesentery. No discrete fluid collection is seen to suggest an abscess or pseudocyst. Pancreatic neck, body and tail appear well preserved. Spleen:  Normal in size without focal abnormality. Adrenals/Urinary Tract: Adrenal glands are unremarkable. Kidneys are normal, without renal calculi, focal lesion, or hydronephrosis. Bladder is unremarkable. Stomach/Bowel: Stomach is unremarkable. Small bowel is normal in caliber. No wall thickening. Colon is normal in caliber. No wall thickening or inflammation. Mild increased stool burden is noted throughout the colon normal appendix visualized. Vascular/Lymphatic: Limited vascular assessment due to lack intravenous contrast. No vascular abnormality noted. No adenopathy. Reproductive: Status post hysterectomy. No adnexal masses. Other: Trace amount of ascites tracks along the anterior pararenal fascia, mostly on the right and right pericolic gutter collecting in the posterior pelvic recess. No abdominal wall hernia. Musculoskeletal: No fracture or acute finding. No osteoblastic or osteolytic lesions. Degenerative changes noted of the visualized spine most prominent at L4-L5. IMPRESSION: 1. Acute pancreatitis centered on the pancreatic head. No evidence of an abscess or pseudocyst. Pancreatic necrosis or venous thrombosis cannot be assessed due to lack of intravenous contrast. 2. Hepatic steatosis and mild hepatomegaly. 3. Mild increased stool burden throughout the colon. No bowel inflammation. Electronically Signed   By: David  Ormond M.D.   On: 11/29/2017 11:26   Dg Abd 1 View  Result Date: 11/28/2017 CLINICAL DATA:  Abdominal pain EXAM: ABDOMEN - 1 VIEW COMPARISON:  None. FINDINGS: Surgical clips are present in the RIGHT UPPER QUADRANT the abdomen. There is a nonobstructive bowel gas pattern. No evidence for organomegaly. Pelvic calcifications are likely vascular. IMPRESSION: No evidence for acute  abnormality. Electronically Signed   By: Elizabeth  Brown M.D.   On: 11/28/2017 20:31   Us Renal  Result Date: 11/28/2017 CLINICAL DATA:  Acute kidney injury. EXAM: RENAL / URINARY TRACT ULTRASOUND COMPLETE  COMPARISON:  None. FINDINGS: Right Kidney: Length: 11.2 cm. Echogenicity within normal limits. No mass or hydronephrosis visualized. Left Kidney: Length: 11 cm. Echogenicity within normal limits. No mass or hydronephrosis visualized. Bladder: Decompressed secondary to Foley   catheter. IMPRESSION: No significant renal abnormality is noted. Electronically Signed   By: James  Green Jr, M.D.   On: 11/28/2017 12:31   Portable Chest Xray  Result Date: 11/29/2017 CLINICAL DATA:  41-year-old female with respiratory failure. EXAM: PORTABLE CHEST 1 VIEW COMPARISON:  11/28/2017 and earlier. FINDINGS: Portable AP upright view at 0515 hours. Extubated and enteric tube removed. Stable lung volumes. Increased streaky opacity at the lung bases most resembling atelectasis. No pneumothorax, pulmonary edema, pleural effusion or consolidation. Normal cardiac size and mediastinal contours. Paucity bowel gas in the upper abdomen. IMPRESSION: 1. Extubated and enteric tube removed. 2. Stable lung volumes.  Basilar atelectasis. Electronically Signed   By: H  Hall M.D.   On: 11/29/2017 07:33   Dg Chest Portable 1 View  Result Date: 11/28/2017 CLINICAL DATA:  Found unresponsive EXAM: PORTABLE CHEST 1 VIEW COMPARISON:  11/15/2017 FINDINGS: Endotracheal tube is 2.7 cm above the carina. NG tube enters the stomach. Heart is borderline in size. Mild vascular congestion and bibasilar atelectasis. No effusions or overt edema. IMPRESSION: Borderline heart size, vascular congestion.  Bibasilar atelectasis. Electronically Signed   By: Kevin  Dover M.D.   On: 11/28/2017 02:19   Us Abdomen Limited Ruq  Result Date: 11/29/2017 CLINICAL DATA:  RIGHT UPPER QUADRANT pain for 1 week. EXAM: ULTRASOUND ABDOMEN LIMITED RIGHT UPPER QUADRANT COMPARISON:  CT of the abdomen and pelvis on 11/29/2017 FINDINGS: Gallbladder: Cholecystectomy. Common bile duct: Diameter: 3-8 millimeters Liver: The liver is heterogeneous with focal areas of increased  echogenicity. No focal liver lesions are identified. Portal vein is patent on color Doppler imaging with normal direction of blood flow towards the liver. There is a small amount of perihepatic fluid. IMPRESSION: 1. Cholecystectomy. 2. Hepatic steatosis.  Small amount of perihepatic fluid. 3. Normal caliber bile duct accounting for cholecystectomy. Electronically Signed   By: Elizabeth  Brown M.D.   On: 11/29/2017 14:18      Impression / Plan:   Terri Berry is a 41 y.o. Caucasian female with metabolic syndrome, hypertriglyceridemia, status post cholecystectomy, fatty liver admitted with altered mental status, temporarily intubated, found to have acute pancreatitis.  Recent attack of acute pancreatitis on 11/07/2017, admitted at [facility], serum triglycerides were 729.  Acute pancreatitis: Etiology most likely secondary to hypertriglyceridemia No evidence of cholelithiasis, LFTs normal Aggressive IV hydration Check serum triglycerides, if elevated recommend tight control with insulin as inpatient and long-term control with statin or fibrates N.p.o. except ice chips Pain control  Thank you for involving me in the care of this patient.  Will follow along with you    LOS: 1 day    , MD  11/29/2017, 3:09 PM   Note: This dictation was prepared with Dragon dictation along with smaller phrase technology. Any transcriptional errors that result from this process are unintentional.  

## 2017-11-29 NOTE — Progress Notes (Signed)
[employer] [facility] - [place] at [facility]   PATIENT NAME: Terri Berry    MR#:  [id number]  DATE OF BIRTH:  [birth year]  SUBJECTIVE:  Alert and extubated Denies SI She was seen by psychiatry yesterday and poses no imminent risk to herself or others.  REVIEW OF SYSTEMS:    Review of Systems  Constitutional: Negative for fever, chills weight loss HENT: Negative for ear pain, nosebleeds, congestion, facial swelling, rhinorrhea, neck pain, neck stiffness and ear discharge.   Respiratory: Negative for cough, shortness of breath, wheezing  Cardiovascular: Negative for chest pain, palpitations and leg swelling.  Gastrointestinal: Negative for heartburn, ++ abdominal pain, no vomiting, diarrhea or consitpation Genitourinary: Negative for dysuria, urgency, frequency, hematuria Musculoskeletal: Negative for back pain or joint pain Neurological: Negative for dizziness, seizures, syncope, focal weakness,  numbness and headaches.  Hematological: Does not bruise/bleed easily.  Psychiatric/Behavioral: Negative for hallucinations, confusion, dysphoric mood    Tolerating Diet: npo      DRUG ALLERGIES:   Allergies  Allergen Reactions  . Cefpodoxime   . Clarithromycin   . Doxycycline   . Ferrous Gluconate   . Levofloxacin   . Rizatriptan   . Sulfa Antibiotics   . Topiramate   . Venlafaxine   . Zofran [Ondansetron Hcl]     VITALS:  Blood pressure (!) 134/104, pulse (!) 109, temperature 98.2 F (36.8 C), temperature source Oral, resp. rate 18, height 5\' 7"  (1.702 m), weight 95.4 kg, SpO2 96 %.  PHYSICAL EXAMINATION:  Constitutional: Appears well-developed and well-nourished. No distress. HENT: Normocephalic. Marland Kitchen Oropharynx is clear and moist.  Eyes: Conjunctivae and EOM are normal. PERRLA, no scleral icterus.  Neck: Normal ROM. Neck supple. No JVD. No tracheal deviation. CVS: RRR, S1/S2 +, no murmurs, no gallops, no carotid bruit.  Pulmonary: Effort and breath sounds  normal, no stridor, rhonchi, wheezes, rales.  Abdominal: Epigastric tenderness without rebound or guarding Musculoskeletal: Normal range of motion. No edema and no tenderness.  Neuro: Alert. CN 2-12 grossly intact. No focal deficits. Skin: Skin is warm and dry. No rash noted. Psychiatric: Normal mood and affect.      LABORATORY PANEL:   CBC Recent Labs  Lab 11/29/17 0438  WBC 14.8*  HGB 10.9*  HCT 36.2  PLT 259   ------------------------------------------------------------------------------------------------------------------  Chemistries  Recent Labs  Lab 11/28/17 2039  NA 139  137  K 4.7  4.7  CL 105  103  CO2 25  23  GLUCOSE 128*  125*  BUN 18  18  CREATININE 1.05*  1.05*  CALCIUM 8.7*  8.6*  MG 1.7  AST 17  ALT 14  ALKPHOS 91  BILITOT 0.4   ------------------------------------------------------------------------------------------------------------------  Cardiac Enzymes No results for input(s): TROPONINI in the last 168 hours. ------------------------------------------------------------------------------------------------------------------  RADIOLOGY:  Ct Abdomen Pelvis Wo Contrast  Result Date: 11/29/2017 CLINICAL DATA:  [name], rates pain 10/10. Patient was requesting a kpad, medication for gas, constipation, and pancreas.Clarified with patient what medications she was able to have and reassured her that the NP would order any other medications needed. Patient also requesting to get up to [facility] and try to have BM. Pt assisted up to [facility] with no results but was passing gas. Patient assisted back to bed and warm blanket applied to patients abd and back. EXAM: CT ABDOMEN AND PELVIS WITHOUT CONTRAST TECHNIQUE: Multidetector CT imaging of the abdomen and pelvis was performed following the standard protocol without IV contrast. COMPARISON:  None. FINDINGS: Lower chest:  Dependent lower lobe opacity bilaterally consistent with atelectasis. Heart  is normal in size. Hepatobiliary: Liver mildly enlarged with diffusely decreased attenuation. No liver mass or focal lesion. Gallbladder surgically absent. No bile duct dilation. Pancreas: Inflammatory changes surround the pancreatic head and uncinate process, tracking along the anterior pararenal fascia, mostly on the right and extending along the gastrohepatic ligament and root of the small bowel mesentery. No discrete fluid collection is seen to suggest an abscess or pseudocyst. Pancreatic neck, body and tail appear well preserved. Spleen: Normal in size without focal abnormality. Adrenals/Urinary Tract: Adrenal glands are unremarkable. Kidneys are normal, without renal calculi, focal lesion, or hydronephrosis. Bladder is unremarkable. Stomach/Bowel: Stomach is unremarkable. Small bowel is normal in caliber. No wall thickening. Colon is normal in caliber. No wall thickening or inflammation. Mild increased stool burden is noted throughout the colon normal appendix visualized. Vascular/Lymphatic: Limited vascular assessment due to lack intravenous contrast. No vascular abnormality noted. No adenopathy. Reproductive: Status post hysterectomy. No adnexal masses. Other: Trace amount of ascites tracks along the anterior pararenal fascia, mostly on the right and right pericolic gutter collecting in the posterior pelvic recess. No abdominal wall hernia. Musculoskeletal: No fracture or acute finding. No osteoblastic or osteolytic lesions. Degenerative changes noted of the visualized spine most prominent at L4-L5. IMPRESSION: 1. Acute pancreatitis centered on the pancreatic head. No evidence of an abscess or pseudocyst. Pancreatic necrosis or venous thrombosis cannot be assessed due to lack of intravenous contrast. 2. Hepatic steatosis and mild hepatomegaly. 3. Mild increased stool burden throughout the colon. No bowel inflammation. Electronically Signed   By: Amie Portland M.D.   On: 11/29/2017 11:26   Dg Abd 1  View  Result Date: 11/28/2017 CLINICAL DATA:  Abdominal pain EXAM: ABDOMEN - 1 VIEW COMPARISON:  None. FINDINGS: Surgical clips are present in the RIGHT UPPER QUADRANT the abdomen. There is a nonobstructive bowel gas pattern. No evidence for organomegaly. Pelvic calcifications are likely vascular. IMPRESSION: No evidence for acute  abnormality. Electronically Signed   By: Norva Pavlov M.D.   On: 11/28/2017 20:31   US Renal  Result Date: 11/28/2017 CLINICAL DATA:  Acute kidney injury. EXAM: RENAL / URINARY TRACT ULTRASOUND COMPLETE COMPARISON:  None. FINDINGS: Right Kidney: Length: 11.2 cm. Echogenicity within normal limits. No mass or hydronephrosis visualized. Left Kidney: Length: 11 cm. Echogenicity within normal limits. No mass or hydronephrosis visualized. Bladder: Decompressed secondary to Foley catheter. IMPRESSION: No significant renal abnormality is noted. Electronically Signed   By: Lupita Raider, M.D.   On: 11/28/2017 12:31   Portable Chest Xray  Result Date: 11/29/2017 CLINICAL DATA:  42 year old female with respiratory failure. EXAM: PORTABLE CHEST 1 VIEW COMPARISON:  11/28/2017 and earlier. FINDINGS: Portable AP upright view at 0515 hours. Extubated and enteric tube removed. Stable lung volumes. Increased streaky opacity at the lung bases most resembling atelectasis. No pneumothorax, pulmonary edema, pleural effusion or consolidation. Normal cardiac size and mediastinal contours. Paucity bowel gas in the upper abdomen. IMPRESSION: 1. Extubated and enteric tube removed. 2. Stable lung volumes.  Basilar atelectasis. Electronically Signed   By: Odessa Fleming M.D.   On: 11/29/2017 07:33   Dg Chest Portable 1 View  Result Date: 11/28/2017 CLINICAL DATA:  Found unresponsive EXAM: PORTABLE CHEST 1 VIEW COMPARISON:  11/15/2017 FINDINGS: Endotracheal tube is 2.7 cm above the carina. NG tube enters the stomach. Heart is borderline in size. Mild vascular congestion and bibasilar atelectasis. No  effusions or overt edema. IMPRESSION: Borderline heart size, vascular congestion.  Bibasilar atelectasis. Electronically Signed   By: Charlett Nose M.D.   On: 11/28/2017 02:19     ASSESSMENT AND PLAN:   42 year old female with history of fibromyalgia, pression and hypertension who presented to the emergency room due to respiratory distress.   1.  Acute hypoxic respiratory failure: Patient was initially intubated on the day of admission and now extubated and has been tolerating room air.  2.  Acute encephalopathy of unclear etiology: Patient's UDS was positive for TCAs.  Encephalopathy has improved.  She was cleared by psychiatry and does not pose imminent threat to herself or others.  3.  Acute kidney injury with hyponatremia which is improved Renal ultrasound showed no hydronephrosis.  4.  Acute pancreatitis seen on CT scan: Continue IV fluids and n.p.o. status. GI consultation placed by intensivist. Follow LFTs Follow-up on ultrasound ordered by intensivist.  5.  Aspiration pneumonia: Continue Unasyn  6. Tobacco dependence: Patient is encouraged to quit smoking. Counseling was provided for 4 minutes.  7.  Depression: She should be restarted on her medications when she is able to take in p.o. Medications.  8.  Essential hypertension: Since patient is n.p.o. outpatient medications on hold however patient should have PRN hydralazine ordered.  Management plans discussed with the patient and she is in agreement.  CODE STATUS: Full  TOTAL TIME TAKING CARE OF THIS PATIENT: 30 minutes.     POSSIBLE D/C 1 to 2 days, DEPENDING ON CLINICAL CONDITION.   Merville Hijazi M.D on 11/29/2017 at 11:32 AM  Between 7am to 6pm - Pager - [phone] After 6pm go to [email or web address] - password [email or web address]  [facility]  Office  [phone]  CC: Primary care physician; Thomes Dinning, MD  Note: This dictation was prepared with Dragon dictation along with smaller phrase  technology. Any transcriptional errors that result from this process are unintentional.

## 2017-11-30 LAB — PROCALCITONIN: Procalcitonin: <0.1 ng/mL

## 2017-11-30 LAB — COMPREHENSIVE METABOLIC PANEL
ALT: 9 U/L (ref 0–44)
AST: 11 U/L — ABNORMAL LOW (ref 15–41)
Albumin: 3.1 g/dL — ABNORMAL LOW (ref 3.5–5.0)
Alkaline Phosphatase: 60 U/L (ref 38–126)
Anion gap: 8 (ref 5–15)
BUN: 10 mg/dL (ref 6–20)
CO2: 27 mmol/L (ref 22–32)
Calcium: 8.3 mg/dL — ABNORMAL LOW (ref 8.9–10.3)
Chloride: 103 mmol/L (ref 98–111)
Creatinine, Ser: 0.8 mg/dL (ref 0.44–1.00)
GFR calc Af Amer: >60 mL/min (ref >60)
GLUCOSE: 104 mg/dL — AB (ref 70–99)
POTASSIUM: 3.5 mmol/L (ref 3.5–5.1)
SODIUM: 138 mmol/L (ref 135–145)
Total Bilirubin: 0.7 mg/dL (ref 0.3–1.2)
Total Protein: 6 g/dL — ABNORMAL LOW (ref 6.5–8.1)

## 2017-11-30 LAB — GLUCOSE, CAPILLARY
GLUCOSE-CAPILLARY: 120 mg/dL — AB (ref 70–99)
GLUCOSE-CAPILLARY: 86 mg/dL (ref 70–99)
Glucose-Capillary: 99 mg/dL (ref 70–99)
Glucose-Capillary: 91 mg/dL (ref 70–99)

## 2017-11-30 MED ORDER — SODIUM CHLORIDE 0.9 % IV SOLN
INTRAVENOUS | Status: DC
  Administered 2017-11-30 – 2017-12-01 (×4): via INTRAVENOUS

## 2017-11-30 MED ORDER — GEMFIBROZIL 600 MG PO TABS
600.0000 mg | ORAL_TABLET | Freq: Two times a day (BID) | ORAL | Status: DC
  Administered 2017-11-30 – 2017-12-04 (×6): 600 mg via ORAL
  Filled 2017-11-30 (×10): qty 1

## 2017-11-30 MED ORDER — POLYETHYLENE GLYCOL 3350 17 G PO PACK
17.0000 g | PACK | Freq: Two times a day (BID) | ORAL | Status: DC
  Administered 2017-11-30 – 2017-12-04 (×3): 17 g via ORAL
  Filled 2017-11-30 (×5): qty 1

## 2017-11-30 MED ORDER — PROMETHAZINE HCL 25 MG/ML IJ SOLN
25.0000 mg | Freq: Four times a day (QID) | INTRAMUSCULAR | Status: DC | PRN
  Administered 2017-11-30 – 2017-12-04 (×9): 25 mg via INTRAVENOUS
  Filled 2017-11-30 (×9): qty 1

## 2017-11-30 NOTE — Progress Notes (Signed)
Report called to [facility], RN and patient transported by bed to room 217.

## 2017-11-30 NOTE — Progress Notes (Signed)
[employer] - [place] at [facility]   PATIENT NAME: Terri Berry    MR#:  [id number]  DATE OF BIRTH:  07/17/1975  SUBJECTIVE:  Patient complaining of abdominal pain and nausea  No vomiting REVIEW OF SYSTEMS:    Review of Systems  Constitutional: Negative for fever, chills weight loss HENT: Negative for ear pain, nosebleeds, congestion, facial swelling, rhinorrhea, neck pain, neck stiffness and ear discharge.   Respiratory: Negative for cough, shortness of breath, wheezing  Cardiovascular: Negative for chest pain, palpitations and leg swelling.  Gastrointestinal: Negative for heartburn, ++ abdominal pain, no vomiting, diarrhea or consitpation Genitourinary: Negative for dysuria, urgency, frequency, hematuria Musculoskeletal: Negative for back pain or joint pain Neurological: Negative for dizziness, seizures, syncope, focal weakness,  numbness and headaches.  Hematological: Does not bruise/bleed easily.  Psychiatric/Behavioral: Negative for hallucinations, confusion, dysphoric mood    Tolerating Diet: npo      DRUG ALLERGIES:   Allergies  Allergen Reactions  . Cefpodoxime   . Clarithromycin   . Doxycycline   . Ferrous Gluconate   . Levofloxacin   . Rizatriptan   . Sulfa Antibiotics   . Topiramate   . Venlafaxine   . Zofran [Ondansetron Hcl]     VITALS:  Blood pressure (!) 160/107, pulse 93, temperature 98 F (36.7 C), temperature source Oral, resp. rate 18, height 5\' 7"  (1.702 m), weight 98.2 kg, SpO2 95 %.  PHYSICAL EXAMINATION:  Constitutional: Appears well-developed and well-nourished. No distress. HENT: Normocephalic. Marland Kitchen Oropharynx is clear and moist.  Eyes: Conjunctivae and EOM are normal. PERRLA, no scleral icterus.  Neck: Normal ROM. Neck supple. No JVD. No tracheal deviation. CVS: RRR, S1/S2 +, no murmurs, no gallops, no carotid bruit.  Pulmonary: Effort and breath sounds normal, no stridor, rhonchi, wheezes, rales.  Abdominal:  Epigastric tenderness without rebound or guarding Musculoskeletal: Normal range of motion. No edema and no tenderness.  Neuro: Alert. CN 2-12 grossly intact. No focal deficits. Skin: Skin is warm and dry. No rash noted. Psychiatric: Normal mood and affect.      LABORATORY PANEL:   CBC Recent Labs  Lab 11/29/17 0438  WBC 14.8*  HGB 10.9*  HCT 36.2  PLT 259   ------------------------------------------------------------------------------------------------------------------  Chemistries  Recent Labs  Lab 11/28/17 2039  11/30/17 0401  NA 139  137   < > 138  K 4.7  4.7   < > 3.5  CL 105  103   < > 103  CO2 25  23   < > 27  GLUCOSE 128*  125*   < > 104*  BUN 18  18   < > 10  CREATININE 1.05*  1.05*   < > 0.80  CALCIUM 8.7*  8.6*   < > 8.3*  MG 1.7  --   --   AST 17   < > 11*  ALT 14   < > 9  ALKPHOS 91   < > 60  BILITOT 0.4   < > 0.7   < > = values in this interval not displayed.   ------------------------------------------------------------------------------------------------------------------  Cardiac Enzymes No results for input(s): TROPONINI in the last 168 hours. ------------------------------------------------------------------------------------------------------------------  RADIOLOGY:  Ct Abdomen Pelvis Wo Contrast  Result Date: 11/29/2017 CLINICAL DATA:  [name], rates pain 10/10. Patient was requesting a kpad, medication for gas, constipation, and pancreas.Clarified with patient what medications she was able to have and reassured her that the NP would order any other medications needed. Patient also  requesting to get up to [facility] and try to have BM. Pt assisted up to [facility] with no results but was passing gas. Patient assisted back to bed and warm blanket applied to patients abd and back. EXAM: CT ABDOMEN AND PELVIS WITHOUT CONTRAST TECHNIQUE: Multidetector CT imaging of the abdomen and pelvis was performed following the standard protocol  without IV contrast. COMPARISON:  None. FINDINGS: Lower chest: Dependent lower lobe opacity bilaterally consistent with atelectasis. Heart is normal in size. Hepatobiliary: Liver mildly enlarged with diffusely decreased attenuation. No liver mass or focal lesion. Gallbladder surgically absent. No bile duct dilation. Pancreas: Inflammatory changes surround the pancreatic head and uncinate process, tracking along the anterior pararenal fascia, mostly on the right and extending along the gastrohepatic ligament and root of the small bowel mesentery. No discrete fluid collection is seen to suggest an abscess or pseudocyst. Pancreatic neck, body and tail appear well preserved. Spleen: Normal in size without focal abnormality. Adrenals/Urinary Tract: Adrenal glands are unremarkable. Kidneys are normal, without renal calculi, focal lesion, or hydronephrosis. Bladder is unremarkable. Stomach/Bowel: Stomach is unremarkable. Small bowel is normal in caliber. No wall thickening. Colon is normal in caliber. No wall thickening or inflammation. Mild increased stool burden is noted throughout the colon normal appendix visualized. Vascular/Lymphatic: Limited vascular assessment due to lack intravenous contrast. No vascular abnormality noted. No adenopathy. Reproductive: Status post hysterectomy. No adnexal masses. Other: Trace amount of ascites tracks along the anterior pararenal fascia, mostly on the right and right pericolic gutter collecting in the posterior pelvic recess. No abdominal wall hernia. Musculoskeletal: No fracture or acute finding. No osteoblastic or osteolytic lesions. Degenerative changes noted of the visualized spine most prominent at L4-L5. IMPRESSION: 1. Acute pancreatitis centered on the pancreatic head. No evidence of an abscess or pseudocyst. Pancreatic necrosis or venous thrombosis cannot be assessed due to lack of intravenous contrast. 2. Hepatic steatosis and mild hepatomegaly. 3. Mild increased stool  burden throughout the colon. No bowel inflammation. Electronically Signed   By: Amie Portland M.D.   On: 11/29/2017 11:26   Dg Abd 1 View  Result Date: 11/28/2017 CLINICAL DATA:  Abdominal pain EXAM: ABDOMEN - 1 VIEW COMPARISON:  None. FINDINGS: Surgical clips are present in the RIGHT UPPER QUADRANT the abdomen. There is a nonobstructive bowel gas pattern. No evidence for organomegaly. Pelvic calcifications are likely vascular. IMPRESSION: No evidence for acute  abnormality. Electronically Signed   By: Norva Pavlov M.D.   On: 11/28/2017 20:31   US Renal  Result Date: 11/28/2017 CLINICAL DATA:  Acute kidney injury. EXAM: RENAL / URINARY TRACT ULTRASOUND COMPLETE COMPARISON:  None. FINDINGS: Right Kidney: Length: 11.2 cm. Echogenicity within normal limits. No mass or hydronephrosis visualized. Left Kidney: Length: 11 cm. Echogenicity within normal limits. No mass or hydronephrosis visualized. Bladder: Decompressed secondary to Foley catheter. IMPRESSION: No significant renal abnormality is noted. Electronically Signed   By: Lupita Raider, M.D.   On: 11/28/2017 12:31   Portable Chest Xray  Result Date: 11/29/2017 CLINICAL DATA:  42 year old female with respiratory failure. EXAM: PORTABLE CHEST 1 VIEW COMPARISON:  11/28/2017 and earlier. FINDINGS: Portable AP upright view at 0515 hours. Extubated and enteric tube removed. Stable lung volumes. Increased streaky opacity at the lung bases most resembling atelectasis. No pneumothorax, pulmonary edema, pleural effusion or consolidation. Normal cardiac size and mediastinal contours. Paucity bowel gas in the upper abdomen. IMPRESSION: 1. Extubated and enteric tube removed. 2. Stable lung volumes.  Basilar atelectasis. Electronically Signed   By: Rexene Edison  Margo Aye M.D.   On: 11/29/2017 07:33   US Abdomen Limited Ruq  Result Date: 11/29/2017 CLINICAL DATA:  RIGHT UPPER QUADRANT pain for 1 week. EXAM: ULTRASOUND ABDOMEN LIMITED RIGHT UPPER QUADRANT COMPARISON:  CT  of the abdomen and pelvis on 11/29/2017 FINDINGS: Gallbladder: Cholecystectomy. Common bile duct: Diameter: 3-8 millimeters Liver: The liver is heterogeneous with focal areas of increased echogenicity. No focal liver lesions are identified. Portal vein is patent on color Doppler imaging with normal direction of blood flow towards the liver. There is a small amount of perihepatic fluid. IMPRESSION: 1. Cholecystectomy. 2. Hepatic steatosis.  Small amount of perihepatic fluid. 3. Normal caliber bile duct accounting for cholecystectomy. Electronically Signed   By: Norva Pavlov M.D.   On: 11/29/2017 14:18     ASSESSMENT AND PLAN:   42 year old female with history of fibromyalgia, pression and hypertension who presented to the emergency room due to respiratory distress.   1.  Acute hypoxic respiratory failure: Patient was initially intubated on the day of admission and now extubated and from a respiratory standpoint is doing well   2.  Acute encephalopathy of unclear etiology: Patient's UDS was positive for TCAs.  Encephalopathy has improved.  She was cleared by psychiatry and does not pose imminent threat to herself or others.  3.  Acute kidney injury with hyponatremia which has improved.  Renal ultrasound showed no hydronephrosis.  4.  Acute pancreatitis seen on CT scan and is etiology of ongoing abdominal pain: Continue IV fluids and pain management with PRN antiemetics. GI consultation appreciated Etiology of acute pancreatitis due to hypertriglyceridemia Gemfibrozil started Repeat triglyceride levels in 6 weeks..  5.  Aspiration pneumonia: Continue Unasyn for 2 more days then STOP  6. Tobacco dependence: Patient is encouraged to quit smoking. Counseling was provided. Nicotine patch 7.  Depression/anxiety: Continue Remeron Klonopin and Prozac  8.  Essential hypertension: Continue Coreg, lisinopril   Management plans discussed with the patient and she is in agreement.  CODE STATUS:  Full  TOTAL TIME TAKING CARE OF THIS PATIENT: 24 minutes.     POSSIBLE D/C 2 days, DEPENDING ON CLINICAL CONDITION.   Eltha Tingley M.D on 11/30/2017 at 10:14 AM  Between 7am to 6pm - Pager - [phone] After 6pm go to [email or web address] - password [employer]  [facility] [place] [facility]  Office  [phone]  CC: Primary care physician; Thomes Dinning, MD  Note: This dictation was prepared with Dragon dictation along with smaller phrase technology. Any transcriptional errors that result from this process are unintentional.

## 2017-11-30 NOTE — Progress Notes (Signed)
PT Cancellation Note  Patient Details Name: Terri Berry MRN: [id number] DOB: [birth year]   Cancelled Treatment:    Reason Eval/Treat Not Completed: Other (comment);Medical issues which prohibited therapy. Still having very high BP's and will try again tomorrow.   Ivar Drape 11/30/2017, 5:11 PM   Samul Dada, PT MS Acute Rehab Dept. Number: [facility] [id number] and [facility] [phone]

## 2017-11-30 NOTE — Progress Notes (Signed)
Arlyss Repress, MD [address]  [address]  [place], [place] [address]  Main: [phone]  Fax: [phone] Pager: [phone]   Subjective: Transferred to the floor.  Feels somewhat better today.  Reports abdominal pain and nausea.  Would like to try some ice chips.  She reports passing gas.  Did not have a bowel movement yet.  She does not recall when her last BM was.  Denies any fevers.   Objective: Vital signs in last 24 hours: Vitals:   11/30/17 0319 11/30/17 0345 11/30/17 0439 11/30/17 0924  BP:   (!) 144/99 (!) 160/107  Pulse: (!) 104  91 93  Resp: 14  18   Temp:   98 F (36.7 C)   TempSrc:   Oral   SpO2: 95%  93% 95%  Weight:  98.2 kg    Height:       Weight change: 2.8 kg  Intake/Output Summary (Last 24 hours) at 11/30/2017 1344 Last data filed at 11/30/2017 1024 Gross per 24 hour  Intake 4813.05 ml  Output 3950 ml  Net 863.05 ml     Exam: Heart:: Regular rate and rhythm or S1S2 present Lungs: normal and clear to auscultation Abdomen: soft, nontender, normal bowel sounds   Lab Results: CBC Latest Ref Rng & Units 11/29/2017 11/28/2017 11/15/2017  WBC 4.0 - 10.5 K/uL 14.8(H) 16.7(H) 13.1(H)  Hemoglobin 12.0 - 15.0 g/dL 10.9(L) 11.7(L) 12.2  Hematocrit 36.0 - 46.0 % 36.2 38.8 39.3  Platelets 150 - 400 K/uL 259 354 316   CMP Latest Ref Rng & Units 11/30/2017 11/29/2017 11/28/2017  Glucose 70 - 99 mg/dL 962(X) 528(U) 132(G)  BUN 6 - 20 mg/dL 10 15 18   Creatinine 0.44 - 1.00 mg/dL 4.01 0.27(O) 5.36(U)  Sodium 135 - 145 mmol/L 138 137 139  Potassium 3.5 - 5.1 mmol/L 3.5 4.1 4.7  Chloride 98 - 111 mmol/L 103 104 105  CO2 22 - 32 mmol/L 27 23 25   Calcium 8.9 - 10.3 mg/dL 8.3(L) 8.2(L) 8.7(L)  Total Protein 6.5 - 8.1 g/dL 6.0(L) 6.2(L) -  Total Bilirubin 0.3 - 1.2 mg/dL 0.7 0.6 -  Alkaline Phos 38 - 126 U/L 60 69 -  AST 15 - 41 U/L 11(L) 20 -  ALT 0 - 44 U/L 9 10 -   Micro Results: Recent Results (from the past 240 hour(s))  MRSA PCR  Screening     Status: None   Collection Time: 11/28/17  7:38 AM  Result Value Ref Range Status   MRSA by PCR NEGATIVE NEGATIVE Final    Comment:        The GeneXpert MRSA Assay (FDA approved for NASAL specimens only), is one component of a comprehensive MRSA colonization surveillance program. It is not intended to diagnose MRSA infection nor to guide or monitor treatment for MRSA infections. Performed at [facility], [address] [place], [place] [address]    Studies/Results: Ct Abdomen Pelvis Wo Contrast  Result Date: 11/29/2017 CLINICAL DATA:  [name], rates pain 10/10. Patient was requesting a kpad, medication for gas, constipation, and pancreas.Clarified with patient what medications she was able to have and reassured her that the NP would order any other medications needed. Patient also requesting to get up to [facility] and try to have BM. Pt assisted up to [facility] with no results but was passing gas. Patient assisted back to bed and warm blanket applied to patients abd and back. EXAM: CT ABDOMEN AND PELVIS  WITHOUT CONTRAST TECHNIQUE: Multidetector CT imaging of the abdomen and pelvis was performed following the standard protocol without IV contrast. COMPARISON:  None. FINDINGS: Lower chest: Dependent lower lobe opacity bilaterally consistent with atelectasis. Heart is normal in size. Hepatobiliary: Liver mildly enlarged with diffusely decreased attenuation. No liver mass or focal lesion. Gallbladder surgically absent. No bile duct dilation. Pancreas: Inflammatory changes surround the pancreatic head and uncinate process, tracking along the anterior pararenal fascia, mostly on the right and extending along the gastrohepatic ligament and root of the small bowel mesentery. No discrete fluid collection is seen to suggest an abscess or pseudocyst. Pancreatic neck, body and tail appear well preserved. Spleen: Normal in size without focal abnormality. Adrenals/Urinary Tract:  Adrenal glands are unremarkable. Kidneys are normal, without renal calculi, focal lesion, or hydronephrosis. Bladder is unremarkable. Stomach/Bowel: Stomach is unremarkable. Small bowel is normal in caliber. No wall thickening. Colon is normal in caliber. No wall thickening or inflammation. Mild increased stool burden is noted throughout the colon normal appendix visualized. Vascular/Lymphatic: Limited vascular assessment due to lack intravenous contrast. No vascular abnormality noted. No adenopathy. Reproductive: Status post hysterectomy. No adnexal masses. Other: Trace amount of ascites tracks along the anterior pararenal fascia, mostly on the right and right pericolic gutter collecting in the posterior pelvic recess. No abdominal wall hernia. Musculoskeletal: No fracture or acute finding. No osteoblastic or osteolytic lesions. Degenerative changes noted of the visualized spine most prominent at L4-L5. IMPRESSION: 1. Acute pancreatitis centered on the pancreatic head. No evidence of an abscess or pseudocyst. Pancreatic necrosis or venous thrombosis cannot be assessed due to lack of intravenous contrast. 2. Hepatic steatosis and mild hepatomegaly. 3. Mild increased stool burden throughout the colon. No bowel inflammation. Electronically Signed   By: Amie Portland M.D.   On: 11/29/2017 11:26   Dg Abd 1 View  Result Date: 11/28/2017 CLINICAL DATA:  Abdominal pain EXAM: ABDOMEN - 1 VIEW COMPARISON:  None. FINDINGS: Surgical clips are present in the RIGHT UPPER QUADRANT the abdomen. There is a nonobstructive bowel gas pattern. No evidence for organomegaly. Pelvic calcifications are likely vascular. IMPRESSION: No evidence for acute  abnormality. Electronically Signed   By: Norva Pavlov M.D.   On: 11/28/2017 20:31   Portable Chest Xray  Result Date: 11/29/2017 CLINICAL DATA:  42 year old female with respiratory failure. EXAM: PORTABLE CHEST 1 VIEW COMPARISON:  11/28/2017 and earlier. FINDINGS: Portable AP  upright view at 0515 hours. Extubated and enteric tube removed. Stable lung volumes. Increased streaky opacity at the lung bases most resembling atelectasis. No pneumothorax, pulmonary edema, pleural effusion or consolidation. Normal cardiac size and mediastinal contours. Paucity bowel gas in the upper abdomen. IMPRESSION: 1. Extubated and enteric tube removed. 2. Stable lung volumes.  Basilar atelectasis. Electronically Signed   By: Odessa Fleming M.D.   On: 11/29/2017 07:33   US Abdomen Limited Ruq  Result Date: 11/29/2017 CLINICAL DATA:  RIGHT UPPER QUADRANT pain for 1 week. EXAM: ULTRASOUND ABDOMEN LIMITED RIGHT UPPER QUADRANT COMPARISON:  CT of the abdomen and pelvis on 11/29/2017 FINDINGS: Gallbladder: Cholecystectomy. Common bile duct: Diameter: 3-8 millimeters Liver: The liver is heterogeneous with focal areas of increased echogenicity. No focal liver lesions are identified. Portal vein is patent on color Doppler imaging with normal direction of blood flow towards the liver. There is a small amount of perihepatic fluid. IMPRESSION: 1. Cholecystectomy. 2. Hepatic steatosis.  Small amount of perihepatic fluid. 3. Normal caliber bile duct accounting for cholecystectomy. Electronically Signed   By: Norva Pavlov  M.D.   On: 11/29/2017 14:18   Medications: I have reviewed the patient's current medications. Scheduled Meds: . carvedilol  25 mg Oral BID WC  . FLUoxetine  40 mg Oral Daily  . gemfibrozil  600 mg Oral BID AC  . lisinopril  40 mg Oral Daily  . mouth rinse  15 mL Mouth Rinse BID  . Melatonin  10 mg Oral QHS  . mirtazapine  30 mg Oral QHS  . nicotine  14 mg Transdermal Daily  . pantoprazole  40 mg Oral Daily  . polyethylene glycol  17 g Oral BID  . pregabalin  200 mg Oral TID   Continuous Infusions: . sodium chloride 150 mL/hr at 11/30/17 1039  . ampicillin-sulbactam (UNASYN) IV 3 g (11/30/17 1041)   PRN Meds:.acetaminophen, clonazePAM, diphenhydrAMINE, fentaNYL (SUBLIMAZE) injection,  hydrALAZINE, ketorolac, promethazine, simethicone, tiZANidine   Assessment: Principal Problem:   Overdose  Acute pancreatitis secondary to hypertriglyceridemia Afebrile  Plan: Continue IV fluids, currently on normal saline at 150 ml per hour N.p.o. except ice chips Pain control, antiemetics as needed Aggressive bowel regimen Started on gemfibrozil Expect to clinically improve in the next 24 to 48 hours.  If abdominal pain is progressively worse recommend repeat CT  GI will follow along with you   LOS: 2 days   Rohini Vanga 11/30/2017, 1:44 PM

## 2017-11-30 NOTE — Progress Notes (Signed)
11/30/2017 5:35 PM   Patients aunt Seward Grater called for update. Updated aunt on her current status. Patient in a lot of pain, but pain medication seems to be decreasing the pain some. Gave aunt patiens direct number to contact patient.   Madie Reno, RN

## 2017-12-01 LAB — FERRITIN: FERRITIN: 36 ng/mL (ref 11–307)

## 2017-12-01 LAB — IRON AND TIBC
IRON: 21 ug/dL — AB (ref 28–170)
Saturation Ratios: 6 % — ABNORMAL LOW (ref 10.4–31.8)
TIBC: 381 ug/dL (ref 250–450)
UIBC: 360 ug/dL

## 2017-12-01 LAB — FOLATE: FOLATE: 10.3 ng/mL (ref >5.9)

## 2017-12-01 LAB — VITAMIN B12: Vitamin B-12: 176 pg/mL — ABNORMAL LOW (ref 180–914)

## 2017-12-01 LAB — GLUCOSE, CAPILLARY
GLUCOSE-CAPILLARY: 79 mg/dL (ref 70–99)
Glucose-Capillary: 81 mg/dL (ref 70–99)

## 2017-12-01 MED ORDER — SODIUM CHLORIDE 0.9 % IV SOLN
INTRAVENOUS | Status: DC
  Administered 2017-12-02: 03:00:00 via INTRAVENOUS

## 2017-12-01 MED ORDER — MORPHINE SULFATE (PF) 2 MG/ML IV SOLN
2.0000 mg | INTRAVENOUS | Status: DC | PRN
  Administered 2017-12-01 – 2017-12-04 (×11): 2 mg via INTRAVENOUS
  Filled 2017-12-01 (×11): qty 1

## 2017-12-01 MED ORDER — CHLORTHALIDONE 25 MG PO TABS
25.0000 mg | ORAL_TABLET | Freq: Every day | ORAL | Status: DC
  Administered 2017-12-01 – 2017-12-02 (×2): 25 mg via ORAL
  Filled 2017-12-01 (×3): qty 1

## 2017-12-01 NOTE — Evaluation (Signed)
Physical Therapy Evaluation Patient Details Name: Terri Berry MRN: [id number] DOB: 12-23-1975 Today's Date: 12/01/2017   History of Present Illness  Pt is a 42 y.o. female presenting to hospital 11/28/17 after being found lying on ground unresponsive by her father; pt also noted to be hypoxic.  Of note, pt with recent ED visit 11/15/17 for polypharmacy with unintentional overdose.  On this hospital admission, pt intubated but now extubated.  Pt admitted with AMS with drug overdose, AKI with hyperkalemia, acute hypoxic respiratory failure, and acute pancreatitis.  PMH includes obesity, DM, htn, IBS, fibromyalgia, and depression.   Clinical Impression  Prior to hospital admission, pt was ambulatory (no AD).  Pt lives with her father in 1 level home with 4-5 STE with B railings.  Pt in bathroom upon PT arrival.  Limited session d/t pt's c/o 10/10 abdominal pain (nurse notified).  Currently pt is CGA with transfers (increased effort to stand noted) and CGA short distance ambulation from bathroom to recliner chair.  Pt requiring B UE support on IV pole for balance and d/t overall generalized weakness (anticipate pt would benefit from RW use to assist with above noted impairments).  Pt would benefit from skilled PT to address noted impairments and functional limitations (see below for any additional details).  Upon hospital discharge, recommend pt discharge to home with HHPT and support of family.    Follow Up Recommendations Home health PT    Equipment Recommendations  Rolling walker with 5" wheels    Recommendations for Other Services OT consult     Precautions / Restrictions Precautions Precautions: Fall Restrictions Weight Bearing Restrictions: No      Mobility  Bed Mobility               General bed mobility comments: Deferred (pt on toilet upon PT arrival and in chair end of session).  Transfers Overall transfer level: Needs assistance Equipment used: None Transfers: Sit  to/from Stand Sit to Stand: Min guard         General transfer comment: increased effort/time to stand from [facility] (over toilet) using B BSC chair arms to push off of  Ambulation/Gait Ambulation/Gait assistance: Min guard Gait Distance (Feet): 20 Feet Assistive device: IV Pole(B UE's on IV pole)   Gait velocity: decreased   General Gait Details: decreased B step length/foot clearance/heelstrike; mildly flexed posture d/t abdominal pain  Stairs            Wheelchair Mobility    Modified Rankin (Stroke Patients Only)       Balance Overall balance assessment: Needs assistance Sitting-balance support: No upper extremity supported;Feet supported Sitting balance-Leahy Scale: Good Sitting balance - Comments: steady sitting reaching within BOS   Standing balance support: Single extremity supported Standing balance-Leahy Scale: Poor Standing balance comment: requires at least single UE support for static standing balance (d/t abdominal pain)                             Pertinent Vitals/Pain Pain Assessment: 0-10 Pain Score: 10-Worst pain ever Pain Location: abdominal Pain Descriptors / Indicators: Aching;Sore Pain Intervention(s): Limited activity within patient's tolerance;Monitored during session;Repositioned;Patient requesting pain meds-RN notified  Vitals (HR and O2 on room air) stable and WFL throughout treatment session.  BP 164/95 after therapy session finished.    Home Living Family/patient expects to be discharged to:: Private residence Living Arrangements: Parent(Pt's father) Available Help at Discharge: Family Type of Home: House Home Access: Stairs to enter  Entrance Stairs-Rails: Right;Left;Can reach both Entrance Stairs-Number of Steps: 4-5 Home Layout: One level Home Equipment: None      Prior Function Level of Independence: Independent               Hand Dominance        Extremity/Trunk Assessment   Upper Extremity  Assessment Upper Extremity Assessment: Generalized weakness    Lower Extremity Assessment Lower Extremity Assessment: Generalized weakness    Cervical / Trunk Assessment Cervical / Trunk Assessment: Normal  Communication   Communication: No difficulties  Cognition Arousal/Alertness: Awake/alert Behavior During Therapy: WFL for tasks assessed/performed Overall Cognitive Status: Within Functional Limits for tasks assessed                                        General Comments   Nursing cleared pt for participation in physical therapy.  Pt agreeable to PT session, although limited d/t abdominal pain.    Exercises     Assessment/Plan    PT Assessment Patient needs continued PT services  PT Problem List Decreased strength;Decreased activity tolerance;Decreased balance;Decreased mobility;Decreased knowledge of use of DME;Decreased knowledge of precautions;Pain       PT Treatment Interventions DME instruction;Gait training;Stair training;Functional mobility training;Therapeutic activities;Therapeutic exercise;Balance training;Patient/family education    PT Goals (Current goals can be found in the Care Plan section)  Acute Rehab PT Goals Patient Stated Goal: to go home and get stronger PT Goal Formulation: With patient Time For Goal Achievement: 12/15/17 Potential to Achieve Goals: Good    Frequency Min 2X/week   Barriers to discharge        Co-evaluation               AM-PAC PT "6 Clicks" Daily Activity  Outcome Measure Difficulty turning over in bed (including adjusting bedclothes, sheets and blankets)?: A Little Difficulty moving from lying on back to sitting on the side of the bed? : A Lot Difficulty sitting down on and standing up from a chair with arms (e.g., wheelchair, bedside commode, etc,.)?: Unable Help needed moving to and from a bed to chair (including a wheelchair)?: A Little Help needed walking in hospital room?: A Little Help  needed climbing 3-5 steps with a railing? : A Lot 6 Click Score: 14    End of Session Equipment Utilized During Treatment: Gait belt Activity Tolerance: Patient limited by pain Patient left: in chair;with call bell/phone within reach;with chair alarm set Nurse Communication: Mobility status;Precautions;Patient requests pain meds PT Visit Diagnosis: Other abnormalities of gait and mobility (R26.89);Muscle weakness (generalized) (M62.81);Difficulty in walking, not elsewhere classified (R26.2)    Time: 0955-1010 PT Time Calculation (min) (ACUTE ONLY): 15 min   Charges:   PT Evaluation $PT Eval Low Complexity: 1 Low         Jourdyn Hasler, PT 12/01/17, 1:54 PM [phone]

## 2017-12-01 NOTE — Progress Notes (Signed)
Responded to IV consult. Pt up in chair and on phone. Pt hung up phone and made additional phone call. Spoke with RN who states he will notify VAS Team when phone call completed and pt back in bed.

## 2017-12-01 NOTE — Progress Notes (Signed)
Arlyss Repress, MD [address]  [address]  [place], [place] [address]  Main: [phone]  Fax: [phone] Pager: [phone]   Subjective: Patient reports that her abdominal pain is improving.  She denies nausea.  She has not required much pain medication.  She would like to try something to drink.  She did not have bowel movement yet  Objective: Vital signs in last 24 hours: Vitals:   12/01/17 0300 12/01/17 0428 12/01/17 0911 12/01/17 1049  BP:  (!) 155/100 (!) 171/100 (!) 164/95  Pulse:  71 81 63  Resp:  (!) 24  18  Temp:  98.3 F (36.8 C)  97.9 F (36.6 C)  TempSrc:  Oral  Oral  SpO2:  97% 96% 96%  Weight: 97.8 kg     Height:       Weight change: -0.4 kg  Intake/Output Summary (Last 24 hours) at 12/01/2017 2004 Last data filed at 12/01/2017 0913 Gross per 24 hour  Intake 2072.72 ml  Output 1950 ml  Net 122.72 ml     Exam: Heart:: Regular rate and rhythm or S1S2 present Lungs: normal and clear to auscultation Abdomen: soft, mildly tender, mildly distended in the upper abdomen, normal bowel sounds   Lab Results: CBC Latest Ref Rng & Units 11/29/2017 11/28/2017 11/15/2017  WBC 4.0 - 10.5 K/uL 14.8(H) 16.7(H) 13.1(H)  Hemoglobin 12.0 - 15.0 g/dL 10.9(L) 11.7(L) 12.2  Hematocrit 36.0 - 46.0 % 36.2 38.8 39.3  Platelets 150 - 400 K/uL 259 354 316   CMP Latest Ref Rng & Units 11/30/2017 11/29/2017 11/28/2017  Glucose 70 - 99 mg/dL 962(X) 528(U) 132(G)  BUN 6 - 20 mg/dL 10 15 18   Creatinine 0.44 - 1.00 mg/dL 4.01 0.27(O) 5.36(U)  Sodium 135 - 145 mmol/L 138 137 139  Potassium 3.5 - 5.1 mmol/L 3.5 4.1 4.7  Chloride 98 - 111 mmol/L 103 104 105  CO2 22 - 32 mmol/L 27 23 25   Calcium 8.9 - 10.3 mg/dL 8.3(L) 8.2(L) 8.7(L)  Total Protein 6.5 - 8.1 g/dL 6.0(L) 6.2(L) -  Total Bilirubin 0.3 - 1.2 mg/dL 0.7 0.6 -  Alkaline Phos 38 - 126 U/L 60 69 -  AST 15 - 41 U/L 11(L) 20 -  ALT 0 - 44 U/L 9 10 -   Micro Results: Recent Results (from the past 240  hour(s))  MRSA PCR Screening     Status: None   Collection Time: 11/28/17  7:38 AM  Result Value Ref Range Status   MRSA by PCR NEGATIVE NEGATIVE Final    Comment:        The GeneXpert MRSA Assay (FDA approved for NASAL specimens only), is one component of a comprehensive MRSA colonization surveillance program. It is not intended to diagnose MRSA infection nor to guide or monitor treatment for MRSA infections. Performed at [facility], [address] [place], [place] [address]    Studies/Results: No results found. Medications: I have reviewed the patient's current medications. Scheduled Meds: . carvedilol  25 mg Oral BID WC  . chlorthalidone  25 mg Oral Daily  . FLUoxetine  40 mg Oral Daily  . gemfibrozil  600 mg Oral BID AC  . lisinopril  40 mg Oral Daily  . mouth rinse  15 mL Mouth Rinse BID  . Melatonin  10 mg Oral QHS  . mirtazapine  30 mg Oral QHS  . nicotine  14 mg Transdermal Daily  . pantoprazole  40 mg Oral Daily  . polyethylene glycol  17 g Oral BID  . pregabalin  200 mg Oral TID   Continuous Infusions: . sodium chloride Stopped (12/01/17 1323)  . ampicillin-sulbactam (UNASYN) IV 3 g (12/01/17 1819)   PRN Meds:.acetaminophen, clonazePAM, diphenhydrAMINE, hydrALAZINE, ketorolac, morphine injection, promethazine, simethicone, tiZANidine   Assessment: Principal Problem:   Overdose  Acute pancreatitis secondary to hypertriglyceridemia Afebrile  Plan: Continue IV fluids, decreased rate to 75 mL/h Clear liquid diet Pain control, antiemetics as needed Aggressive bowel regimen Started on gemfibrozil Expect to clinically improve in the next 24 to 48 hours.  If abdominal pain is progressively worse recommend repeat CT  GI will follow along with you   LOS: 3 days   Rohini Vanga 12/01/2017, 8:04 PM

## 2017-12-01 NOTE — Progress Notes (Signed)
[facility] - [place] at [facility]   PATIENT NAME: Terri Berry    MR#:  [id number]  DATE OF BIRTH:  [birth year]  SUBJECTIVE:   Still having pretty significant abdominal pain.  Also noting some edema of her hands and feet.  Also endorses cough productive of "mucus".  No shortness of breath.  REVIEW OF SYSTEMS:    Review of Systems  Constitutional: Negative for fever, chills weight loss HENT: Negative for ear pain, nosebleeds, congestion, facial swelling, rhinorrhea, neck pain, neck stiffness and ear discharge.   Respiratory: Negative for shortness of breath, wheezing.  Positive for cough. Cardiovascular: Negative for chest pain, palpitations and leg swelling.  Gastrointestinal: Negative for heartburn, +abdominal pain, no vomiting, diarrhea or consitpation Genitourinary: Negative for dysuria, urgency, frequency, hematuria Musculoskeletal: Negative for back pain or joint pain Neurological: Negative for dizziness, seizures, syncope, focal weakness,  numbness and headaches.  Hematological: Does not bruise/bleed easily.  Psychiatric/Behavioral: Negative for hallucinations, confusion, dysphoric mood  Tolerating Diet: npo  DRUG ALLERGIES:   Allergies  Allergen Reactions  . Cefpodoxime   . Clarithromycin   . Doxycycline   . Ferrous Gluconate   . Levofloxacin   . Rizatriptan   . Sulfa Antibiotics   . Topiramate   . Venlafaxine   . Zofran [Ondansetron Hcl]     VITALS:  Blood pressure (!) 164/95, pulse 63, temperature 97.9 F (36.6 C), temperature source Oral, resp. rate 18, height 5\' 7"  (1.702 m), weight 97.8 kg, SpO2 96 %.  PHYSICAL EXAMINATION:  Constitutional: Appears well-developed and well-nourished. No distress. HENT: Normocephalic. Marland Kitchen Oropharynx is clear and moist.  Eyes: Conjunctivae and EOM are normal. PERRLA, no scleral icterus.  Neck: Normal ROM. Neck supple. No JVD. No tracheal deviation. CVS: RRR, S1/S2 +, no murmurs, no gallops, no carotid  bruit.  Pulmonary: Effort and breath sounds normal, no stridor, rhonchi, wheezes, rales.  Abdominal: +Epigastric tenderness, no rebound or guarding, abdomen soft, + BS Musculoskeletal: Normal range of motion. No edema and no tenderness.  Neuro: Alert. CN 2-12 grossly intact. No focal deficits. Skin: Skin is warm and dry. No rash noted. Psychiatric: Normal mood and affect.   LABORATORY PANEL:   CBC Recent Labs  Lab 11/29/17 0438  WBC 14.8*  HGB 10.9*  HCT 36.2  PLT 259   ------------------------------------------------------------------------------------------------------------------  Chemistries  Recent Labs  Lab 11/28/17 2039  11/30/17 0401  NA 139  137   < > 138  K 4.7  4.7   < > 3.5  CL 105  103   < > 103  CO2 25  23   < > 27  GLUCOSE 128*  125*   < > 104*  BUN 18  18   < > 10  CREATININE 1.05*  1.05*   < > 0.80  CALCIUM 8.7*  8.6*   < > 8.3*  MG 1.7  --   --   AST 17   < > 11*  ALT 14   < > 9  ALKPHOS 91   < > 60  BILITOT 0.4   < > 0.7   < > = values in this interval not displayed.   ------------------------------------------------------------------------------------------------------------------  Cardiac Enzymes No results for input(s): TROPONINI in the last 168 hours. ------------------------------------------------------------------------------------------------------------------  RADIOLOGY:  No results found.   ASSESSMENT AND PLAN:   42 year old female with history of fibromyalgia, pression and hypertension who presented to the emergency room due to respiratory distress.  Acute pancreatitis due to hypertriglyceridemia- seen on CT scan.  Having continued abdominal pain. -NPO -Cut down on IV fluids due to edema -Pain management with PRN antiemetics. -GI following -Continue gemfibrozil -Repeat triglyceride levels in 6 weeks..  Acute hypoxic respiratory failure: Secondary to aspiration pneumonia.  Patient was initially intubated on the day of  admission and now extubated. -Continue Unasyn, with plans to discontinue tomorrow -Monitor  Acute encephalopathy of unclear etiology: Resolved.  Patient's UDS was positive for TCAs.  Encephalopathy has improved.  She was cleared by psychiatry and does not pose imminent threat to herself or others.  Acute kidney injury with hyponatremia which has resolved.  Creatinine back to baseline.  Renal ultrasound showed no hydronephrosis.  Tobacco dependence: Patient is encouraged to quit smoking. Counseling was provided. -Nicotine patch  Depression/anxiety: Stable, cleared by psych -Continue Remeron Klonopin and Prozac  Essential hypertension: BPs have been elevated.  Having some edema of the hands and feet. -Continue Coreg, lisinopril -Restart chlorthalidone   Management plans discussed with the patient and she is in agreement.  CODE STATUS: Full  TOTAL TIME TAKING CARE OF THIS PATIENT: 35 minutes.   POSSIBLE D/C 2-3 days, DEPENDING ON CLINICAL CONDITION.   Jinny Blossom Bhumi Godbey M.D on 12/01/2017 at 4:37 PM  Between 7am to 6pm - Pager - [phone] After 6pm go to [email or web address] - password [email or web address]  [facility]  Office  [phone]  CC: Primary care physician; Thomes Dinning, MD  Note: This dictation was prepared with Dragon dictation along with smaller phrase technology. Any transcriptional errors that result from this process are unintentional.

## 2017-12-02 ENCOUNTER — Inpatient Hospital Stay: Payer: Self-pay

## 2017-12-02 LAB — COMPREHENSIVE METABOLIC PANEL
ALT: 19 U/L (ref 0–44)
ANION GAP: 9 (ref 5–15)
AST: 37 U/L (ref 15–41)
Albumin: 3 g/dL — ABNORMAL LOW (ref 3.5–5.0)
Alkaline Phosphatase: 57 U/L (ref 38–126)
BUN: 9 mg/dL (ref 6–20)
CHLORIDE: 106 mmol/L (ref 98–111)
CO2: 28 mmol/L (ref 22–32)
Calcium: 8.3 mg/dL — ABNORMAL LOW (ref 8.9–10.3)
Creatinine, Ser: 0.79 mg/dL (ref 0.44–1.00)
GFR calc non Af Amer: >60 mL/min (ref >60)
Glucose, Bld: 96 mg/dL (ref 70–99)
POTASSIUM: 3.4 mmol/L — AB (ref 3.5–5.1)
SODIUM: 143 mmol/L (ref 135–145)
Total Bilirubin: 0.7 mg/dL (ref 0.3–1.2)
Total Protein: 6.7 g/dL (ref 6.5–8.1)

## 2017-12-02 LAB — CBC
HCT: 32 % — ABNORMAL LOW (ref 36.0–46.0)
Hemoglobin: 9.8 g/dL — ABNORMAL LOW (ref 12.0–15.0)
MCH: 25.2 pg — AB (ref 26.0–34.0)
MCHC: 30.6 g/dL (ref 30.0–36.0)
MCV: 82.3 fL (ref 80.0–100.0)
PLATELETS: 257 10*3/uL (ref 150–400)
RBC: 3.89 MIL/uL (ref 3.87–5.11)
RDW: 14.8 % (ref 11.5–15.5)
WBC: 8.3 10*3/uL (ref 4.0–10.5)
nRBC: 0 % (ref 0.0–0.2)

## 2017-12-02 MED ORDER — PROCHLORPERAZINE EDISYLATE 10 MG/2ML IJ SOLN
10.0000 mg | Freq: Four times a day (QID) | INTRAMUSCULAR | Status: DC | PRN
  Administered 2017-12-03 – 2017-12-04 (×2): 10 mg via INTRAVENOUS
  Filled 2017-12-02 (×4): qty 2

## 2017-12-02 MED ORDER — HYDRALAZINE HCL 20 MG/ML IJ SOLN
10.0000 mg | Freq: Once | INTRAMUSCULAR | Status: AC
  Administered 2017-12-02: 10 mg via INTRAVENOUS
  Filled 2017-12-02: qty 1

## 2017-12-02 MED ORDER — LORAZEPAM 2 MG/ML IJ SOLN
1.0000 mg | Freq: Once | INTRAMUSCULAR | Status: AC
  Administered 2017-12-02: 1 mg via INTRAVENOUS
  Filled 2017-12-02: qty 1

## 2017-12-02 MED ORDER — BOOST / RESOURCE BREEZE PO LIQD CUSTOM
1.0000 | Freq: Three times a day (TID) | ORAL | Status: DC
  Administered 2017-12-02: 1 via ORAL

## 2017-12-02 MED ORDER — IOHEXOL 300 MG/ML  SOLN
30.0000 mL | Freq: Once | INTRAMUSCULAR | Status: AC | PRN
  Administered 2017-12-02: 30 mL via ORAL

## 2017-12-02 MED ORDER — HYDROMORPHONE HCL 1 MG/ML IJ SOLN
0.5000 mg | Freq: Once | INTRAMUSCULAR | Status: AC
  Administered 2017-12-02: 0.5 mg via INTRAVENOUS
  Filled 2017-12-02: qty 0.5

## 2017-12-02 MED ORDER — IOPAMIDOL (ISOVUE-300) INJECTION 61%
100.0000 mL | Freq: Once | INTRAVENOUS | Status: AC | PRN
  Administered 2017-12-02: 100 mL via INTRAVENOUS

## 2017-12-02 MED ORDER — DIPHENHYDRAMINE HCL 50 MG/ML IJ SOLN
25.0000 mg | Freq: Once | INTRAMUSCULAR | Status: AC
  Administered 2017-12-02: 25 mg via INTRAVENOUS
  Filled 2017-12-02: qty 1

## 2017-12-02 MED ORDER — POTASSIUM CHLORIDE CRYS ER 20 MEQ PO TBCR
40.0000 meq | EXTENDED_RELEASE_TABLET | Freq: Once | ORAL | Status: AC
  Administered 2017-12-02: 40 meq via ORAL
  Filled 2017-12-02: qty 2

## 2017-12-02 MED ORDER — HYDROMORPHONE HCL 1 MG/ML IJ SOLN
1.0000 mg | Freq: Once | INTRAMUSCULAR | Status: AC
  Administered 2017-12-02: 1 mg via INTRAVENOUS
  Filled 2017-12-02: qty 1

## 2017-12-02 NOTE — Plan of Care (Signed)
Patient not feeling well today.  She as still had quite a bit of pain, headache and not tolerating much fluid intake.

## 2017-12-02 NOTE — Progress Notes (Signed)
[facility] - [place] at [facility]   PATIENT NAME: Terri Berry    MR#:  [id number]  DATE OF BIRTH:  08-Sep-1975  SUBJECTIVE:   Having worsening abdominal pain and distention today.  States that she is very nauseous and has a headache.  Does not feel similar to her usual migraines.  REVIEW OF SYSTEMS:    Review of Systems  Constitutional: Negative for fever, chills weight loss HENT: Negative for ear pain, nosebleeds, congestion, facial swelling, rhinorrhea, neck pain, neck stiffness and ear discharge.  + Headache Respiratory: Negative for shortness of breath, wheezing.  Positive for cough. Cardiovascular: Negative for chest pain, palpitations and leg swelling.  Gastrointestinal: Negative for heartburn, no vomiting, diarrhea or constipation. + Abdominal pain and nausea Genitourinary: Negative for dysuria, urgency, frequency, hematuria Musculoskeletal: Negative for back pain or joint pain Neurological: Negative for dizziness, seizures, syncope, focal weakness,  numbness and headaches.  Hematological: Does not bruise/bleed easily.  Psychiatric/Behavioral: Negative for hallucinations, confusion, dysphoric mood  Tolerating Diet: npo  DRUG ALLERGIES:   Allergies  Allergen Reactions  . Cefpodoxime   . Clarithromycin   . Doxycycline   . Ferrous Gluconate   . Lactose Intolerance (Gi)   . Levofloxacin   . Rizatriptan   . Sulfa Antibiotics   . Topiramate   . Venlafaxine   . Zofran [Ondansetron Hcl]     VITALS:  Blood pressure (!) 188/110, pulse (!) 49, temperature 98 F (36.7 C), temperature source Oral, resp. rate 18, height 5\' 7"  (1.702 m), weight 97.8 kg, SpO2 100 %.  PHYSICAL EXAMINATION:  Constitutional: Appears well-developed and well-nourished. No distress. HENT: Normocephalic.  Atraumatic. Oropharynx is clear and moist.  Eyes: Conjunctivae and EOM are normal. PERRLA, no scleral icterus.  Neck: Normal ROM. Neck supple. No JVD. No tracheal  deviation. CVS: RRR, S1/S2 +, no murmurs, no gallops, no carotid bruit.  Pulmonary: Effort and breath sounds normal, no stridor, rhonchi, wheezes, rales.  Abdominal: + Severe epigastric tenderness, no rebound or guarding, abdomen soft, + BS Musculoskeletal: Normal range of motion. No edema and no tenderness.  Neuro: Alert. CN 2-12 grossly intact. No focal deficits. Skin: Skin is warm and dry. No rash noted. Psychiatric: Normal mood and affect.   LABORATORY PANEL:   CBC Recent Labs  Lab 12/02/17 0345  WBC 8.3  HGB 9.8*  HCT 32.0*  PLT 257   ------------------------------------------------------------------------------------------------------------------  Chemistries  Recent Labs  Lab 11/28/17 2039  12/02/17 0345  NA 139  137   < > 143  K 4.7  4.7   < > 3.4*  CL 105  103   < > 106  CO2 25  23   < > 28  GLUCOSE 128*  125*   < > 96  BUN 18  18   < > 9  CREATININE 1.05*  1.05*   < > 0.79  CALCIUM 8.7*  8.6*   < > 8.3*  MG 1.7  --   --   AST 17   < > 37  ALT 14   < > 19  ALKPHOS 91   < > 57  BILITOT 0.4   < > 0.7   < > = values in this interval not displayed.   ------------------------------------------------------------------------------------------------------------------  Cardiac Enzymes No results for input(s): TROPONINI in the last 168 hours. ------------------------------------------------------------------------------------------------------------------  RADIOLOGY:  No results found.   ASSESSMENT AND PLAN:   42 year old female with history of fibromyalgia, pression and hypertension who presented to the emergency room due  to respiratory distress.  Acute pancreatitis due to hypertriglyceridemia- having worsening pain and nausea this morning -Repeat CT abdomen pelvis today, due to worsening pain -Continue clear liquid diet -Continue IV fluids -Continue pain and antinausea meds -GI following -Continue gemfibrozil -Repeat triglyceride levels in 6  weeks  Headache-likely related to elevated blood pressures.  Patient states this does not feel like a typical migraine. -Increase blood pressure medicine to see if this helps -May need to consider CT head if no improvement  Aspiration pneumonia-much improved. Patient was initially intubated on the day of admission and now extubated. -Last day of Unasyn is today -Monitor  Acute encephalopathy of unclear etiology: Resolved.  Patient's UDS was positive for TCAs.  Encephalopathy has improved.  She was cleared by psychiatry and does not pose imminent threat to herself or others.  Tobacco dependence: Patient is encouraged to quit smoking. Counseling was provided. -Nicotine patch  Depression/anxiety: Stable, cleared by psych -Continue Remeron, Klonopin, and Prozac  Essential hypertension: BPs have been elevated.  Having some edema of the hands and feet. -Continue Coreg, lisinopril, chlorthalidone -Change prn hydralazine parameters to be given for SBP > 160  Management plans discussed with the patient and she is in agreement.  CODE STATUS: Full  TOTAL TIME TAKING CARE OF THIS PATIENT: 40 minutes.   POSSIBLE D/C 2-3 days, DEPENDING ON CLINICAL CONDITION.   Jinny Blossom Mayo M.D on 12/02/2017 at 3:05 PM  Between 7am to 6pm - Pager - [phone] After 6pm go to [email or web address] - password [email or web address]  [facility] [place] [facility]  Office  [phone]  CC: Primary care physician; Thomes Dinning, MD  Note: This dictation was prepared with Dragon dictation along with smaller phrase technology. Any transcriptional errors that result from this process are unintentional.

## 2017-12-02 NOTE — Plan of Care (Signed)
Nutrition Education Note  RD educated patient today regarding a pancreatitis diet   RD provided "Nutrition Therapy with Pancreatitis" handout from the Academy of Nutrition and Dietetics. RD also provided a list of low fat vs high fat foods. Reviewed patient's dietary recall. Advised patient to eat multiple small meals and to avoid high fat foods, spicy foods, and alcohol. Educated pt on the importance of adequate protein intake needed to preserve lean muscle. Gave patient examples of meals and menu planning.    Teach back method used.  Expect poor compliance.  Body mass index is 33.77 kg/m. Pt meets criteria for obesity based on current BMI.  RD following this pt  Betsey Holiday MS, RD, LDN Pager #[phone] Office#- [phone] After Hours Pager: [phone]

## 2017-12-02 NOTE — Progress Notes (Addendum)
Terri Darby, MD [address]  [place]  [place], [place] [address]  Main: [phone]  Fax: [phone] Pager: [phone]   Subjective: Patient reports that her abdominal pain is improving.  Patient was tearful talking to the nurse when I met her in the evening.  She reports that she woke up with severe headache, blood pressure was high, associated with nausea.  She thinks she had a panic attack. She underwent CT head which was unremarkable.  She also underwent CT A/P.  She reports that she had good bowel movement today.  Has been tolerating clear liquid diet.   Objective: Vital signs in last 24 hours: Vitals:   12/02/17 1526 12/02/17 1550 12/02/17 1709 12/02/17 1711  BP: (!) 200/87 (!) 190/96 (!) 161/91 (!) 166/94  Pulse: 64  (!) 57 60  Resp:      Temp:      TempSrc:      SpO2:   99% 100%  Weight:      Height:       Weight change:   Intake/Output Summary (Last 24 hours) at 12/02/2017 1953 Last data filed at 12/02/2017 1723 Gross per 24 hour  Intake 2314.34 ml  Output 702 ml  Net 1612.34 ml     Exam: Heart:: Regular rate and rhythm or S1S2 present Lungs: normal and clear to auscultation Abdomen: soft, mildly tender, mildly distended in the upper abdomen, normal bowel sounds   Lab Results: CBC Latest Ref Rng & Units 12/02/2017 11/29/2017 11/28/2017  WBC 4.0 - 10.5 K/uL 8.3 14.8(H) 16.7(H)  Hemoglobin 12.0 - 15.0 g/dL 9.8(L) 10.9(L) 11.7(L)  Hematocrit 36.0 - 46.0 % 32.0(L) 36.2 38.8  Platelets 150 - 400 K/uL 257 259 354   CMP Latest Ref Rng & Units 12/02/2017 11/30/2017 11/29/2017  Glucose 70 - 99 mg/dL 96 104(H) 162(H)  BUN 6 - 20 mg/dL 9 10 15   Creatinine 0.44 - 1.00 mg/dL 0.79 0.80 1.08(H)  Sodium 135 - 145 mmol/L 143 138 137  Potassium 3.5 - 5.1 mmol/L 3.4(L) 3.5 4.1  Chloride 98 - 111 mmol/L 106 103 104  CO2 22 - 32 mmol/L 28 27 23   Calcium 8.9 - 10.3 mg/dL 8.3(L) 8.3(L) 8.2(L)  Total Protein 6.5 - 8.1 g/dL 6.7 6.0(L) 6.2(L)  Total  Bilirubin 0.3 - 1.2 mg/dL 0.7 0.7 0.6  Alkaline Phos 38 - 126 U/L 57 60 69  AST 15 - 41 U/L 37 11(L) 20  ALT 0 - 44 U/L 19 9 10    Micro Results: Recent Results (from the past 240 hour(s))  MRSA PCR Screening     Status: None   Collection Time: 11/28/17  7:38 AM  Result Value Ref Range Status   MRSA by PCR NEGATIVE NEGATIVE Final    Comment:        The GeneXpert MRSA Assay (FDA approved for NASAL specimens only), is one component of a comprehensive MRSA colonization surveillance program. It is not intended to diagnose MRSA infection nor to guide or monitor treatment for MRSA infections. Performed at [facility], [address], [place], [place] [address]    Studies/Results: Ct Head Wo Contrast  Result Date: 12/02/2017 CLINICAL DATA:  Abdominal pain and distension. Nausea and headache. History of migraines and hypertension. EXAM: CT HEAD WITHOUT CONTRAST TECHNIQUE: Contiguous axial images were obtained from the base of the skull through the vertex without intravenous contrast. COMPARISON:  CT HEAD November 15, 2017 FINDINGS: BRAIN: No intraparenchymal hemorrhage, mass effect nor midline shift. The ventricles  and sulci are normal. No acute large vascular territory infarcts. No abnormal extra-axial fluid collections. Basal cisterns are patent. Subcentimeter pineal cyst. Subcentimeter lipoma along the anterior falx. VASCULAR: Unremarkable. SKULL/SOFT TISSUES: No skull fracture. No significant soft tissue swelling. Calcified and non calcified RIGHT scalp trichilemmals ORBITS/SINUSES: The included ocular globes and orbital contents are normal.Trace paranasal sinus mucosal thickening. Mastoid air cells are well aerated. OTHER: None. IMPRESSION: Negative non-contrast CT HEAD. Electronically Signed   By: Elon Alas M.D.   On: 12/02/2017 18:00   Medications: I have reviewed the patient's current medications. Scheduled Meds: . carvedilol  25 mg Oral BID WC  . chlorthalidone  25  mg Oral Daily  . feeding supplement  1 Container Oral TID BM  . FLUoxetine  40 mg Oral Daily  . gemfibrozil  600 mg Oral BID AC  . lisinopril  40 mg Oral Daily  . mouth rinse  15 mL Mouth Rinse BID  . Melatonin  10 mg Oral QHS  . mirtazapine  30 mg Oral QHS  . nicotine  14 mg Transdermal Daily  . pantoprazole  40 mg Oral Daily  . polyethylene glycol  17 g Oral BID  . pregabalin  200 mg Oral TID   Continuous Infusions: . sodium chloride 75 mL/hr at 12/02/17 1145  . ampicillin-sulbactam (UNASYN) IV Stopped (12/02/17 1659)   PRN Meds:.acetaminophen, clonazePAM, diphenhydrAMINE, hydrALAZINE, ketorolac, morphine injection, prochlorperazine, promethazine, simethicone, tiZANidine   Assessment: Principal Problem:   Overdose  Acute pancreatitis secondary to hypertriglyceridemia Afebrile  Plan: Can discontinue IV fluids Full liquid diet, advance tomorrow after the CT result Pain control, antiemetics as needed Continue bowel regimen Continue gemfibrozil Expect to clinically improve in the next 24 to 48 hours Manage hypertension GI will follow along with you   LOS: 4 days   Christopherjame Carnell 12/02/2017, 7:53 PM

## 2017-12-02 NOTE — Progress Notes (Signed)
Initial Nutrition Assessment  DOCUMENTATION CODES:   Obesity unspecified  INTERVENTION:   Boost Breeze po TID, each supplement provides 250 kcal and 9 grams of protein  RD will add Ensure Enlive po BID when diet advanced, each supplement provides 350 kcal and 20 grams of protein  Recommend B12 and iron supplementation (noted pt has ferrous gluconate allergy listed)   NUTRITION DIAGNOSIS:   Inadequate oral intake related to acute illness as evidenced by per patient/family report.  GOAL:   Patient will meet greater than or equal to 90% of their needs  MONITOR:   PO intake, Supplement acceptance, Labs, Weight trends, Skin, I & O's  REASON FOR ASSESSMENT:   Consult Diet education, Assessment of nutrition requirement/status  ASSESSMENT:   42 y.o. female with a PMHx of hypertension diabetes mellitus type 2, hyperlipidemia, fibromyalgia, depression and irritable bowel syndrome, who was admitted to [facility] on 11/28/2017 for evaluation of overdose.    Met with pt in room today. Pt reports poor appetite and oral intake for several days r/t abdominal pain and nausea. Pt reports her abdominal pain and nausea is constant but is worsened after eating. Pt denies any actual vomiting. Pt reports eating some jello and a popcicle today. Pt again reports pain after eating. Pt reports a similar episode like this a few months ago but reports this time it is much worse. Pt is willing to try ONS; pt would like chocolate supplements when her diet is advanced. Pt reports that she is lactose intolerant but later states that she likes ice cream and 2% milk. Will order Ensure as this is lactose free. Per chart, pt is weight stable. RD provided patient with pancreatitis diet education today and provided diet education regarding her hypertriglyceridemia. Of note, pt with low iron and B12; would recommend supplementation.   Medications reviewed and include: melatonin, mirtazapine, nicotine, protonix, miralax, NaCl  @75ml /hr, unasyn, phenergan   Labs reviewed: K 3.4(L) Triglycerides- 632(H)- 10/26 Iron 21(L), TIBC 381, ferritin 36- 10/28 B12- 176(L)- 10/28 Hgb 9.8(L), Hct 32(L)  NUTRITION - FOCUSED PHYSICAL EXAM:    Most Recent Value  Orbital Region  No depletion  Upper Arm Region  No depletion  Thoracic and Lumbar Region  No depletion  Buccal Region  No depletion  Temple Region  No depletion  Clavicle Bone Region  No depletion  Clavicle and Acromion Bone Region  No depletion  Scapular Bone Region  No depletion  Dorsal Hand  No depletion  Patellar Region  No depletion  Anterior Thigh Region  No depletion  Posterior Calf Region  No depletion  Edema (RD Assessment)  Mild  Hair  Reviewed  Eyes  Reviewed  Mouth  Reviewed  Skin  Reviewed  Nails  Reviewed     Diet Order:   Diet Order            Diet clear liquid Room service appropriate? Yes; Fluid consistency: Thin  Diet effective now             EDUCATION NEEDS:   Education needs have been addressed  Skin:  Skin Assessment: Reviewed RN Assessment  Last BM:  pta  Height:   Ht Readings from Last 1 Encounters:  11/28/17 5' 7"  (1.702 m)    Weight:   Wt Readings from Last 1 Encounters:  12/01/17 97.8 kg    Ideal Body Weight:  61.36 kg  BMI:  Body mass index is 33.77 kg/m.  Estimated Nutritional Needs:   Kcal:  1800-2100kcal/day   Protein:  98-108g/day   Fluid:  >1.8L/day   Koleen Distance MS, RD, LDN Pager #- [phone] Office#- [phone] After Hours Pager: [phone]

## 2017-12-02 NOTE — Progress Notes (Signed)
Called Dr. Nancy Marus to tell her patient's BP was elevated more even after Hydralazine given.  Patient still feeling swimmy headed, dizzy and also feels that her abdomen is more bloated/distended.  She acknowledged and put in orders.

## 2017-12-02 NOTE — Progress Notes (Signed)
Patient had a rough day today.  She had a lot of pain, nausea, and anxiety.  Patients BP was also elevated which was causing her to have a headache, dizziness and feeling swimmy headed.  I was in continuous contact with the MD.  After multiple medications and lots of encouragement the patient seemed a little better.  Head and abdominal CT pending.

## 2017-12-03 LAB — CBC
HEMATOCRIT: 35.5 % — AB (ref 36.0–46.0)
HEMOGLOBIN: 11 g/dL — AB (ref 12.0–15.0)
MCH: 24.9 pg — ABNORMAL LOW (ref 26.0–34.0)
MCHC: 31 g/dL (ref 30.0–36.0)
MCV: 80.5 fL (ref 80.0–100.0)
Platelets: 272 10*3/uL (ref 150–400)
RBC: 4.41 MIL/uL (ref 3.87–5.11)
RDW: 15.5 % (ref 11.5–15.5)
WBC: 8 10*3/uL (ref 4.0–10.5)
nRBC: 0 % (ref 0.0–0.2)

## 2017-12-03 LAB — BASIC METABOLIC PANEL
ANION GAP: 11 (ref 5–15)
BUN: 7 mg/dL (ref 6–20)
CALCIUM: 9.2 mg/dL (ref 8.9–10.3)
CHLORIDE: 101 mmol/L (ref 98–111)
CO2: 28 mmol/L (ref 22–32)
CREATININE: 0.88 mg/dL (ref 0.44–1.00)
GFR calc non Af Amer: >60 mL/min (ref >60)
GLUCOSE: 111 mg/dL — AB (ref 70–99)
Potassium: 3.2 mmol/L — ABNORMAL LOW (ref 3.5–5.1)
Sodium: 140 mmol/L (ref 135–145)

## 2017-12-03 MED ORDER — FLUTICASONE PROPIONATE 50 MCG/ACT NA SUSP
2.0000 | Freq: Every day | NASAL | Status: DC
  Administered 2017-12-03 – 2017-12-04 (×2): 2 via NASAL
  Filled 2017-12-03: qty 16

## 2017-12-03 MED ORDER — HYDRALAZINE HCL 20 MG/ML IJ SOLN: 20.0000 mg | INTRAMUSCULAR | Status: DC | PRN

## 2017-12-03 MED ORDER — AMLODIPINE BESYLATE 10 MG PO TABS
10.0000 mg | ORAL_TABLET | Freq: Every day | ORAL | Status: DC
  Administered 2017-12-04: 10 mg via ORAL
  Filled 2017-12-03: qty 1

## 2017-12-03 MED ORDER — SENNOSIDES-DOCUSATE SODIUM 8.6-50 MG PO TABS
2.0000 | ORAL_TABLET | Freq: Two times a day (BID) | ORAL | Status: DC
  Administered 2017-12-03 – 2017-12-04 (×2): 2 via ORAL
  Filled 2017-12-03 (×2): qty 2

## 2017-12-03 MED ORDER — VITAMIN B-12 1000 MCG PO TABS
1000.0000 ug | ORAL_TABLET | Freq: Every day | ORAL | Status: DC
  Administered 2017-12-04: 1000 ug via ORAL
  Filled 2017-12-03 (×2): qty 1

## 2017-12-03 MED ORDER — FUROSEMIDE 40 MG PO TABS
40.0000 mg | ORAL_TABLET | Freq: Every day | ORAL | Status: DC
  Administered 2017-12-03 – 2017-12-04 (×2): 40 mg via ORAL
  Filled 2017-12-03 (×2): qty 1

## 2017-12-03 MED ORDER — CHLORTHALIDONE 25 MG PO TABS
50.0000 mg | ORAL_TABLET | Freq: Every day | ORAL | Status: DC
  Administered 2017-12-04: 50 mg via ORAL
  Filled 2017-12-03: qty 2

## 2017-12-03 MED ORDER — AMLODIPINE BESYLATE 5 MG PO TABS
5.0000 mg | ORAL_TABLET | Freq: Once | ORAL | Status: AC
  Administered 2017-12-03: 5 mg via ORAL
  Filled 2017-12-03: qty 1

## 2017-12-03 MED ORDER — POTASSIUM CHLORIDE CRYS ER 20 MEQ PO TBCR
40.0000 meq | EXTENDED_RELEASE_TABLET | Freq: Two times a day (BID) | ORAL | Status: AC
  Administered 2017-12-03 – 2017-12-04 (×2): 40 meq via ORAL
  Filled 2017-12-03 (×2): qty 2

## 2017-12-03 MED ORDER — NIFEDIPINE ER OSMOTIC RELEASE 30 MG PO TB24
30.0000 mg | ORAL_TABLET | Freq: Every day | ORAL | Status: DC
  Administered 2017-12-03: 30 mg via ORAL
  Filled 2017-12-03: qty 1

## 2017-12-03 MED ORDER — ENSURE ENLIVE PO LIQD
237.0000 mL | Freq: Two times a day (BID) | ORAL | Status: DC
  Administered 2017-12-04: 237 mL via ORAL

## 2017-12-03 MED ORDER — CHLORTHALIDONE 25 MG PO TABS: 50.0000 mg | ORAL_TABLET | Freq: Every day | ORAL | Status: DC

## 2017-12-03 MED ORDER — SUMATRIPTAN SUCCINATE 50 MG PO TABS
50.0000 mg | ORAL_TABLET | Freq: Once | ORAL | Status: AC
  Administered 2017-12-03: 50 mg via ORAL
  Filled 2017-12-03: qty 1

## 2017-12-03 NOTE — Progress Notes (Signed)
Arlyss Repress, MD [address]  [address]  [place], [place] [address]  Main: [phone]  Fax: [phone] Pager: [phone]   Subjective: Reports feeling the same as yesterday.  Patient appears to be comfortable talking to the nurse.But, when I asked her about abdominal pain, she said she is still hurting, nausea.    Objective: Vital signs in last 24 hours: Vitals:   12/03/17 0716 12/03/17 1057 12/03/17 1401 12/03/17 1615  BP: (!) 192/110 (!) 164/96 113/87 110/74  Pulse: 69 77 70 60  Resp:   14 18  Temp:   (!) 97.5 F (36.4 C) 97.8 F (36.6 C)  TempSrc:   Oral Oral  SpO2:   98% 92%  Weight:      Height:       Weight change:   Intake/Output Summary (Last 24 hours) at 12/03/2017 1629 Last data filed at 12/03/2017 0433 Gross per 24 hour  Intake 801.25 ml  Output 400 ml  Net 401.25 ml     Exam: Heart:: Regular rate and rhythm or S1S2 present Lungs: normal and clear to auscultation Abdomen: soft, mildly tender, mildly distended in the upper abdomen, normal bowel sounds   Lab Results: CBC Latest Ref Rng & Units 12/03/2017 12/02/2017 11/29/2017  WBC 4.0 - 10.5 K/uL 8.0 8.3 14.8(H)  Hemoglobin 12.0 - 15.0 g/dL 11.0(L) 9.8(L) 10.9(L)  Hematocrit 36.0 - 46.0 % 35.5(L) 32.0(L) 36.2  Platelets 150 - 400 K/uL 272 257 259   CMP Latest Ref Rng & Units 12/03/2017 12/02/2017 11/30/2017  Glucose 70 - 99 mg/dL 962(X) 96 528(U)  BUN 6 - 20 mg/dL 7 9 10   Creatinine 0.44 - 1.00 mg/dL 1.32 4.40 1.02  Sodium 135 - 145 mmol/L 140 143 138  Potassium 3.5 - 5.1 mmol/L 3.2(L) 3.4(L) 3.5  Chloride 98 - 111 mmol/L 101 106 103  CO2 22 - 32 mmol/L 28 28 27   Calcium 8.9 - 10.3 mg/dL 9.2 7.2(Z) 8.3(L)  Total Protein 6.5 - 8.1 g/dL - 6.7 6.0(L)  Total Bilirubin 0.3 - 1.2 mg/dL - 0.7 0.7  Alkaline Phos 38 - 126 U/L - 57 60  AST 15 - 41 U/L - 37 11(L)  ALT 0 - 44 U/L - 19 9   Micro Results: Recent Results (from the past 240 hour(s))  MRSA PCR Screening     Status: None     Collection Time: 11/28/17  7:38 AM  Result Value Ref Range Status   MRSA by PCR NEGATIVE NEGATIVE Final    Comment:        The GeneXpert MRSA Assay (FDA approved for NASAL specimens only), is one component of a comprehensive MRSA colonization surveillance program. It is not intended to diagnose MRSA infection nor to guide or monitor treatment for MRSA infections. Performed at [facility], [address], [place], [place] [address]    Studies/Results: Ct Head Wo Contrast  Result Date: 12/02/2017 CLINICAL DATA:  Abdominal pain and distension. Nausea and headache. History of migraines and hypertension. EXAM: CT HEAD WITHOUT CONTRAST TECHNIQUE: Contiguous axial images were obtained from the base of the skull through the vertex without intravenous contrast. COMPARISON:  CT HEAD November 15, 2017 FINDINGS: BRAIN: No intraparenchymal hemorrhage, mass effect nor midline shift. The ventricles and sulci are normal. No acute large vascular territory infarcts. No abnormal extra-axial fluid collections. Basal cisterns are patent. Subcentimeter pineal cyst. Subcentimeter lipoma along the anterior falx. VASCULAR: Unremarkable. SKULL/SOFT TISSUES: No skull fracture. No significant soft tissue swelling. Calcified and  non calcified RIGHT scalp trichilemmals ORBITS/SINUSES: The included ocular globes and orbital contents are normal.Trace paranasal sinus mucosal thickening. Mastoid air cells are well aerated. OTHER: None. IMPRESSION: Negative non-contrast CT HEAD. Electronically Signed   By: Awilda Metro M.D.   On: 12/02/2017 18:00   Ct Abdomen Pelvis W Contrast  Result Date: 12/02/2017 CLINICAL DATA:  Worsening abdominal pain and distension today. Nausea, headache. Pancreatitis. History of irritable bowel syndrome and cholecystectomy. EXAM: CT ABDOMEN AND PELVIS WITH CONTRAST TECHNIQUE: Multidetector CT imaging of the abdomen and pelvis was performed using the standard protocol following  bolus administration of intravenous contrast. CONTRAST:  ISOVUE-300 IOPAMIDOL (ISOVUE-300) INJECTION 61% COMPARISON:  CT abdomen and pelvis November 29, 2017 FINDINGS: LOWER CHEST: Small pleural effusions and bibasilar atelectasis. Heart size is normal. No pericardial effusion. HEPATOBILIARY: Status post cholecystectomy.  Normal liver. PANCREAS: Decreased inflammatory changes about the pancreatic head. No focal necrosis, mass, ductal dilatation, calcification or pseudocyst. SPLEEN: Normal. ADRENALS/URINARY TRACT: Kidneys are orthotopic, demonstrating symmetric enhancement. No nephrolithiasis, hydronephrosis or solid renal masses. Too small to characterize hypodensity upper pole bilateral kidneys. The unopacified ureters are normal in course and caliber. Delayed imaging through the kidneys demonstrates symmetric prompt contrast excretion within the proximal urinary collecting system. Urinary bladder is well distended. Minimal intraluminal gas after Foley removal. Normal adrenal glands. STOMACH/BOWEL: Mild reactive inflammatory changes of the duodenum. The stomach, and large bowel are normal in course and caliber without inflammatory changes. Normal appendix. VASCULAR/LYMPHATIC: Aortoiliac vessels are normal in course and caliber. No lymphadenopathy by CT size criteria. REPRODUCTIVE: Status post hysterectomy. OTHER: Trace free fluid about the pancreas without drainable fluid collection. Small amount of presacral inflammatory changes. 11 mm focal fluid versus omental cyst RIGHT lower quadrant. MUSCULOSKELETAL: Nonacute. Severe L5-S1 disc height loss, vacuum disc and endplate sclerosis compatible with degenerative disc resulting in moderate to severe LEFT L4-5 neural foraminal narrowing. Small disc osteophyte complex resulting in moderate to severe RIGHT L3-4 neural foraminal narrowing. IMPRESSION: 1. Improving pancreatitis without complication. Mild reactive duodenitis. 2. Mild presacral inflammatory changes may  be secondary to pancreatitis though, early pelvic process possible. Recommend close attention on follow-up imaging. Electronically Signed   By: Awilda Metro M.D.   On: 12/02/2017 19:51   Medications: I have reviewed the patient's current medications. Scheduled Meds: . [START ON 12/04/2017] amLODipine  10 mg Oral Daily  . carvedilol  25 mg Oral BID WC  . [START ON 12/04/2017] chlorthalidone  50 mg Oral Daily  . feeding supplement (ENSURE ENLIVE)  237 mL Oral BID BM  . FLUoxetine  40 mg Oral Daily  . fluticasone  2 spray Each Nare Daily  . furosemide  40 mg Oral Daily  . gemfibrozil  600 mg Oral BID AC  . lisinopril  40 mg Oral Daily  . mouth rinse  15 mL Mouth Rinse BID  . Melatonin  10 mg Oral QHS  . mirtazapine  30 mg Oral QHS  . nicotine  14 mg Transdermal Daily  . pantoprazole  40 mg Oral Daily  . polyethylene glycol  17 g Oral BID  . potassium chloride  40 mEq Oral BID  . pregabalin  200 mg Oral TID  . senna-docusate  2 tablet Oral BID  . SUMAtriptan  50 mg Oral Once  . vitamin B-12  1,000 mcg Oral Daily   Continuous Infusions:  PRN Meds:.acetaminophen, clonazePAM, diphenhydrAMINE, hydrALAZINE, ketorolac, morphine injection, prochlorperazine, promethazine, simethicone, tiZANidine   Assessment: Principal Problem:   Overdose  Acute pancreatitis secondary  to hypertriglyceridemia Afebrile Repeat CT showed resolving pancreatitis There might be some component of attention seeking and not willing to be discharged home  Plan: Soft diet today, advance as tolerated Pain control, antiemetics as needed Continue bowel regimen Continue gemfibrozil Expect to clinically improve in the next 24 to 48 hours Manage hypertension  GI will sign off, please call us back with questions   LOS: 5 days   Ibtisam Benge 12/03/2017, 4:29 PM

## 2017-12-03 NOTE — Progress Notes (Signed)
[facility] [place] [facility]   PATIENT NAME: Terri Berry    MR#:  [id number]  DATE OF BIRTH:  March 19, 1975  SUBJECTIVE:   Feeling a little bit better today.  Blood pressures remained high overnight, but have improved this morning.  Patient states she thinks she is developing a migraine.  Also endorses abdominal distention.  REVIEW OF SYSTEMS:    Review of Systems  Constitutional: Negative for fever, chills weight loss HENT: Negative for ear pain, nosebleeds, congestion, facial swelling, rhinorrhea, neck pain, neck stiffness and ear discharge.  + Headache Respiratory: Negative for shortness of breath, wheezing.  Positive for cough. Cardiovascular: Negative for chest pain, palpitations and leg swelling.  Gastrointestinal: Negative for heartburn, no vomiting, diarrhea or constipation. + Abdominal pain and nausea and distention Genitourinary: Negative for dysuria, urgency, frequency, hematuria Musculoskeletal: Negative for back pain or joint pain Neurological: Negative for dizziness, seizures, syncope, focal weakness,  numbness and headaches.  Hematological: Does not bruise/bleed easily.  Psychiatric/Behavioral: Negative for hallucinations, confusion, dysphoric mood  Tolerating Diet: Yes-has been on full liquid diet  DRUG ALLERGIES:   Allergies  Allergen Reactions  . Cefpodoxime   . Clarithromycin   . Doxycycline   . Ferrous Gluconate   . Lactose Intolerance (Gi)   . Levofloxacin   . Rizatriptan   . Sulfa Antibiotics   . Topiramate   . Venlafaxine   . Zofran [Ondansetron Hcl]     VITALS:  Blood pressure 113/87, pulse 70, temperature (!) 97.5 F (36.4 C), temperature source Oral, resp. rate 14, height 5\' 7"  (1.702 m), weight 95.7 kg, SpO2 98 %.  PHYSICAL EXAMINATION:  Constitutional: Appears well-developed and well-nourished. No distress. HENT: Normocephalic.  Atraumatic. Oropharynx is clear and moist.  Eyes: Conjunctivae and EOM are normal.  PERRLA, no scleral icterus. + Mild photophobia Neck: Normal ROM. Neck supple. No JVD. No tracheal deviation. CVS: RRR, S1/S2 +, no murmurs, no gallops, no carotid bruit.  Pulmonary: Effort and breath sounds normal, no stridor, rhonchi, wheezes, rales.  Abdominal: + Moderate epigastric tenderness, no rebound or guarding, abdomen soft, + BS, mild abdominal distention Musculoskeletal: Normal range of motion. No edema and no tenderness.  Neuro: Alert. CN 2-12 grossly intact. No focal deficits. Skin: Skin is warm and dry. No rash noted. Psychiatric: Normal mood and affect.   LABORATORY PANEL:   CBC Recent Labs  Lab 12/03/17 0500  WBC 8.0  HGB 11.0*  HCT 35.5*  PLT 272   ------------------------------------------------------------------------------------------------------------------  Chemistries  Recent Labs  Lab 11/28/17 2039  12/02/17 0345 12/03/17 0500  NA 139  137   < > 143 140  K 4.7  4.7   < > 3.4* 3.2*  CL 105  103   < > 106 101  CO2 25  23   < > 28 28  GLUCOSE 128*  125*   < > 96 111*  BUN 18  18   < > 9 7  CREATININE 1.05*  1.05*   < > 0.79 0.88  CALCIUM 8.7*  8.6*   < > 8.3* 9.2  MG 1.7  --   --   --   AST 17   < > 37  --   ALT 14   < > 19  --   ALKPHOS 91   < > 57  --   BILITOT 0.4   < > 0.7  --    < > = values in this interval not displayed.   ------------------------------------------------------------------------------------------------------------------  Cardiac  Enzymes No results for input(s): TROPONINI in the last 168 hours. ------------------------------------------------------------------------------------------------------------------  RADIOLOGY:  Ct Head Wo Contrast  Result Date: 12/02/2017 CLINICAL DATA:  Abdominal pain and distension. Nausea and headache. History of migraines and hypertension. EXAM: CT HEAD WITHOUT CONTRAST TECHNIQUE: Contiguous axial images were obtained from the base of the skull through the vertex without intravenous  contrast. COMPARISON:  CT HEAD November 15, 2017 FINDINGS: BRAIN: No intraparenchymal hemorrhage, mass effect nor midline shift. The ventricles and sulci are normal. No acute large vascular territory infarcts. No abnormal extra-axial fluid collections. Basal cisterns are patent. Subcentimeter pineal cyst. Subcentimeter lipoma along the anterior falx. VASCULAR: Unremarkable. SKULL/SOFT TISSUES: No skull fracture. No significant soft tissue swelling. Calcified and non calcified RIGHT scalp trichilemmals ORBITS/SINUSES: The included ocular globes and orbital contents are normal.Trace paranasal sinus mucosal thickening. Mastoid air cells are well aerated. OTHER: None. IMPRESSION: Negative non-contrast CT HEAD. Electronically Signed   By: Awilda Metro M.D.   On: 12/02/2017 18:00   Ct Abdomen Pelvis W Contrast  Result Date: 12/02/2017 CLINICAL DATA:  Worsening abdominal pain and distension today. Nausea, headache. Pancreatitis. History of irritable bowel syndrome and cholecystectomy. EXAM: CT ABDOMEN AND PELVIS WITH CONTRAST TECHNIQUE: Multidetector CT imaging of the abdomen and pelvis was performed using the standard protocol following bolus administration of intravenous contrast. CONTRAST:  ISOVUE-300 IOPAMIDOL (ISOVUE-300) INJECTION 61% COMPARISON:  CT abdomen and pelvis November 29, 2017 FINDINGS: LOWER CHEST: Small pleural effusions and bibasilar atelectasis. Heart size is normal. No pericardial effusion. HEPATOBILIARY: Status post cholecystectomy.  Normal liver. PANCREAS: Decreased inflammatory changes about the pancreatic head. No focal necrosis, mass, ductal dilatation, calcification or pseudocyst. SPLEEN: Normal. ADRENALS/URINARY TRACT: Kidneys are orthotopic, demonstrating symmetric enhancement. No nephrolithiasis, hydronephrosis or solid renal masses. Too small to characterize hypodensity upper pole bilateral kidneys. The unopacified ureters are normal in course and caliber. Delayed imaging  through the kidneys demonstrates symmetric prompt contrast excretion within the proximal urinary collecting system. Urinary bladder is well distended. Minimal intraluminal gas after Foley removal. Normal adrenal glands. STOMACH/BOWEL: Mild reactive inflammatory changes of the duodenum. The stomach, and large bowel are normal in course and caliber without inflammatory changes. Normal appendix. VASCULAR/LYMPHATIC: Aortoiliac vessels are normal in course and caliber. No lymphadenopathy by CT size criteria. REPRODUCTIVE: Status post hysterectomy. OTHER: Trace free fluid about the pancreas without drainable fluid collection. Small amount of presacral inflammatory changes. 11 mm focal fluid versus omental cyst RIGHT lower quadrant. MUSCULOSKELETAL: Nonacute. Severe L5-S1 disc height loss, vacuum disc and endplate sclerosis compatible with degenerative disc resulting in moderate to severe LEFT L4-5 neural foraminal narrowing. Small disc osteophyte complex resulting in moderate to severe RIGHT L3-4 neural foraminal narrowing. IMPRESSION: 1. Improving pancreatitis without complication. Mild reactive duodenitis. 2. Mild presacral inflammatory changes may be secondary to pancreatitis though, early pelvic process possible. Recommend close attention on follow-up imaging. Electronically Signed   By: Awilda Metro M.D.   On: 12/02/2017 19:51     ASSESSMENT AND PLAN:   42 year old female with history of fibromyalgia, pression and hypertension who presented to the emergency room due to respiratory distress.  Acute pancreatitis due to hypertriglyceridemia-abdominal pain is somewhat improved this morning.  Repeat CT abdomen pelvis yesterday showed improving acute pancreatitis. -Advance to soft diet today -Continue pain and antinausea meds -GI following -Continue gemfibrozil -Repeat triglyceride levels in 6 weeks  Headache-likely related to elevated blood pressures.  Patient thinks she is developing a migraine.  CT  head yesterday was unremarkable. -Blood pressure control -Try  a dose of Imitrex today  Accelerated hypertension: BPs have been in the 200s systolic overnight. -Increase dose of chlorthalidone from 25 mg to 50 mg daily -Continue Coreg and lisinopril at current doses -Add Norvasc 10 mg daily -Increase IV hydralazine prn dose from 10 mg to 20 mg -Restart home Lasix  Constipation- patient has a history of IBS-C -Continue Miralax twice daily -Add Senokot-S 2 tablets twice daily  Aspiration pneumonia-much improved. Patient was initially intubated on the day of admission and now extubated. -Has completed 7-day course of Unasyn -Monitor  Acute encephalopathy of unclear etiology: Resolved.  Patient's UDS was positive for TCAs.  Encephalopathy has improved.  She was cleared by psychiatry and does not pose imminent threat to herself or others.  Tobacco dependence: Patient is encouraged to quit smoking. Counseling was provided. -Nicotine patch  Depression/anxiety: Stable, cleared by psych -Continue Remeron, Klonopin, and Prozac  Vitamin B12 deficiency -Start B12 supplementation  Management plans discussed with the patient and she is in agreement.  CODE STATUS: Full  TOTAL TIME TAKING CARE OF THIS PATIENT: 45 minutes.   POSSIBLE D/C 2-3 days, DEPENDING ON CLINICAL CONDITION.   Jinny Blossom Mayo M.D on 12/03/2017 at 2:50 PM  Between 7am to 6pm - Pager [phone] After 6pm go to [email or web address] - password [email or web address]  [facility]  Office  [phone]  CC: Primary care physician; Thomes Dinning, MD  Note: This dictation was prepared with Dragon dictation along with smaller phrase technology. Any transcriptional errors that result from this process are unintentional.

## 2017-12-03 NOTE — Progress Notes (Signed)
Pt elevated b/p given prn hydralazine, not effective. Called and spoke with prime doc and orders for nifedimine. Will give and cont to monitor.

## 2017-12-03 NOTE — Progress Notes (Signed)
PT Cancellation Note  Patient Details Name: Retha Bither MRN: [id number] DOB: 10-25-1975   Cancelled Treatment:     Pt currently reporting significant pain in her abdominal region and would like to postpone PT until tomorrow. Educated pt on the importance of exercise for recovery but pt continuing to decline at this time. Will try again at a later date. RN notified of pain levels and current status.    Mickel Duhamel, SPT 12/03/2017, 4:01 PM

## 2017-12-04 LAB — BASIC METABOLIC PANEL
ANION GAP: 9 (ref 5–15)
BUN: 13 mg/dL (ref 6–20)
CALCIUM: 8.7 mg/dL — AB (ref 8.9–10.3)
CO2: 31 mmol/L (ref 22–32)
Chloride: 99 mmol/L (ref 98–111)
Creatinine, Ser: 0.93 mg/dL (ref 0.44–1.00)
GLUCOSE: 129 mg/dL — AB (ref 70–99)
Potassium: 3.2 mmol/L — ABNORMAL LOW (ref 3.5–5.1)
Sodium: 139 mmol/L (ref 135–145)

## 2017-12-04 LAB — CBC
HCT: 36.4 % (ref 36.0–46.0)
Hemoglobin: 11.3 g/dL — ABNORMAL LOW (ref 12.0–15.0)
MCH: 25 pg — ABNORMAL LOW (ref 26.0–34.0)
MCHC: 31 g/dL (ref 30.0–36.0)
MCV: 80.5 fL (ref 80.0–100.0)
NRBC: 0 % (ref 0.0–0.2)
PLATELETS: 324 10*3/uL (ref 150–400)
RBC: 4.52 MIL/uL (ref 3.87–5.11)
RDW: 15.9 % — AB (ref 11.5–15.5)
WBC: 9.7 10*3/uL (ref 4.0–10.5)

## 2017-12-04 LAB — MAGNESIUM: Magnesium: 1.5 mg/dL — ABNORMAL LOW (ref 1.7–2.4)

## 2017-12-04 MED ORDER — GEMFIBROZIL 600 MG PO TABS: 600.0000 mg | ORAL_TABLET | Freq: Two times a day (BID) | ORAL | 0 refills | Status: DC

## 2017-12-04 MED ORDER — PROMETHAZINE HCL 12.5 MG PO TABS: 12.5000 mg | ORAL_TABLET | Freq: Four times a day (QID) | ORAL | 0 refills | Status: DC | PRN

## 2017-12-04 MED ORDER — POTASSIUM CHLORIDE CRYS ER 20 MEQ PO TBCR
40.0000 meq | EXTENDED_RELEASE_TABLET | Freq: Two times a day (BID) | ORAL | Status: DC
  Administered 2017-12-04: 40 meq via ORAL
  Filled 2017-12-04 (×2): qty 2

## 2017-12-04 MED ORDER — AMLODIPINE BESYLATE 10 MG PO TABS: 10.0000 mg | ORAL_TABLET | Freq: Every day | ORAL | 0 refills | Status: DC

## 2017-12-04 MED ORDER — CHLORTHALIDONE 50 MG PO TABS: 50.0000 mg | ORAL_TABLET | Freq: Every day | ORAL | 0 refills | Status: DC

## 2017-12-04 MED ORDER — FUROSEMIDE 20 MG PO TABS: 20.0000 mg | ORAL_TABLET | Freq: Every day | ORAL | 0 refills | Status: DC

## 2017-12-04 MED ORDER — OXYCODONE HCL 5 MG PO TABS: 5.0000 mg | ORAL_TABLET | ORAL | 0 refills | Status: DC | PRN

## 2017-12-04 MED ORDER — SENNOSIDES-DOCUSATE SODIUM 8.6-50 MG PO TABS: 2.0000 | ORAL_TABLET | Freq: Two times a day (BID) | ORAL | 0 refills | Status: DC

## 2017-12-04 MED ORDER — FENOFIBRATE 145 MG PO TABS: 145.0000 mg | ORAL_TABLET | Freq: Every day | ORAL | 0 refills | Status: DC

## 2017-12-04 MED ORDER — CYANOCOBALAMIN 1000 MCG PO TABS: 1000.0000 ug | ORAL_TABLET | Freq: Every day | ORAL | 0 refills | Status: DC

## 2017-12-04 MED ORDER — MAGNESIUM SULFATE 2 GM/50ML IV SOLN
2.0000 g | Freq: Once | INTRAVENOUS | Status: AC
  Administered 2017-12-04: 2 g via INTRAVENOUS
  Filled 2017-12-04: qty 50

## 2017-12-04 NOTE — Discharge Instructions (Signed)
It was so nice to meet you during this hospitalization!  We have made the following medication changes and have sent new prescriptions into your pharmacy: 1. Please increase your Chlorthalidone dose from 25mg  daily to 50mg  daily 2. Start Norvasc 10mg  daily (this is a new blood pressure medication) 3. Take Lasix 20mg  daily for 5 days to help with the extra swelling 4. Start Fenofibrate 145mg  daily- this is to help decrease your triglycerides 5. Take Senokot-S 2 tablets twice a day- this is a stool softener 6. Start Vitamin B12 tablets once daily- we found that you have a vitamin B12 deficiency this admission  -Dr. Nancy Marus

## 2017-12-04 NOTE — Discharge Summary (Addendum)
[facility] [place] [facility]   PATIENT NAME: Terri Berry    MR#:  [id number]  DATE OF BIRTH:  08-Jan-1976  DATE OF ADMISSION:  11/28/2017   ADMITTING PHYSICIAN: Barbaraann Rondo, MD  DATE OF DISCHARGE: 12/04/17  PRIMARY CARE PHYSICIAN: Thomes Dinning, MD   ADMISSION DIAGNOSIS:  overdose DISCHARGE DIAGNOSIS:  Principal Problem:   Overdose  SECONDARY DIAGNOSIS:   Past Medical History:  Diagnosis Date  . Hypertension   . IBS (irritable bowel syndrome)    HOSPITAL COURSE:   Terri Berry is a 42 year old female who presented to the ED after being found on the ground unresponsive by her father.  She was felt to have overdosed on an unknown medication.  Narcan was administered without response.  She was intubated on arrival to the ED due to inability to protect her airway.  She reportedly mumbled something about "Zanaflex" prior to intubation.  She had recently been seen in the ED for polypharmacy with unintentional overdose of Lyrica and other muscle relaxants.  Acute encephalopathy of unclear etiology: Resolved.  Patient's UDS was positive for TCAs.  Encephalopathy has improved.  She was cleared by psychiatry and does not pose imminent threat to herself or others.  Acute hypoxic respiratory failure- secondary to aspiration pneumonia. -Intubated for about 24 hours and successfully extubated -Tolerating room air for many days prior to discharge -Treated with a 7-day course of Unasyn  Acute pancreatitis due to hypertriglyceridemia-abdominal pain is somewhat improved this morning.   -Seen on CT abdomen -Treated with IV pain meds, IV antiemetics, and IV fluids -Hypertriglyceridemia treated with gemfibrozil while hospitalized, changed to fenofibrate 145 mg daily on discharge (patient without insurance and this was the only free medication that could be obtained for her) -Repeat triglyceride levels in 6 weeks  Accelerated hypertension: Greatly improved.  Had  some elevated blood pressures with systolics into the 200s, felt to be related to pain and anxiety. -Increased dose of chlorthalidone from 25 mg to 50 mg daily -Continued Coreg and lisinopril at current doses -Added Norvasc 10 mg daily  Headache-improved, likely related to high blood pressures. CT head yesterday was unremarkable. -Follow up as an outpatient  Constipation- patient has a history of IBS-C -Continued Miralax twice daily -Added Senokot-S 2 tablets twice daily  Tobacco dependence: Patient is encouraged to quit smoking. Counseling was provided. -Nicotine patch  Depression/anxiety: Stable, cleared by psych -Continued Remeron, Klonopin, and Prozac  Vitamin B12 deficiency -Started B12 supplementation  DISCHARGE CONDITIONS:  Acute pancreatitis-improved Aspiration pneumonia-resolved Tobacco dependence Hypertriglyceridemia Depression Essential hypertension IBS-constipation CONSULTS OBTAINED:  Treatment Team:  Barbaraann Rondo, MD [place], Jackquline Denmark, MD DRUG ALLERGIES:   Allergies  Allergen Reactions  . Cefpodoxime   . Clarithromycin   . Doxycycline   . Ferrous Gluconate   . Lactose Intolerance (Gi)   . Levofloxacin   . Rizatriptan   . Sulfa Antibiotics   . Topiramate   . Venlafaxine   . Zofran [Ondansetron Hcl]    DISCHARGE MEDICATIONS:   Allergies as of 12/04/2017      Reactions   Cefpodoxime    Clarithromycin    Doxycycline    Ferrous Gluconate    Lactose Intolerance (gi)    Levofloxacin    Rizatriptan    Sulfa Antibiotics    Topiramate    Venlafaxine    Zofran [ondansetron Hcl]       Medication List    STOP taking these medications   famotidine 20 MG tablet Commonly known as:  PEPCID   prochlorperazine 10 MG tablet Commonly known as:  COMPAZINE   propranolol 80 MG tablet Commonly known as:  INDERAL   senna 8.6 MG Tabs tablet Commonly known as:  SENOKOT   tiZANidine 4 MG tablet Commonly known as:  ZANAFLEX     TAKE these  medications   amLODipine 10 MG tablet Commonly known as:  NORVASC Take 1 tablet (10 mg total) by mouth daily. Start taking on:  12/05/2017   atorvastatin 40 MG tablet Commonly known as:  LIPITOR Take 40 mg by mouth daily.   BEANO Tabs Take 100 mg by mouth as needed.   carvedilol 25 MG tablet Commonly known as:  COREG Take 25 mg by mouth 2 (two) times daily with a meal.   chlorthalidone 50 MG tablet Commonly known as:  HYGROTON Take 1 tablet (50 mg total) by mouth daily. What changed:    medication strength  how much to take   clonazePAM 1 MG tablet Commonly known as:  KLONOPIN Take 1 mg by mouth 3 (three) times daily as needed for anxiety.   cyanocobalamin 1000 MCG tablet Take 1 tablet (1,000 mcg total) by mouth daily. Start taking on:  12/05/2017   fenofibrate 145 MG tablet Commonly known as:  TRICOR Take 1 tablet (145 mg total) by mouth daily.   FLUoxetine 20 MG capsule Commonly known as:  PROZAC Take 40 mg by mouth daily.   fluticasone 50 MCG/ACT nasal spray Commonly known as:  FLONASE Place 2 sprays into both nostrils 2 (two) times daily.   furosemide 20 MG tablet Commonly known as:  LASIX Take 1 tablet (20 mg total) by mouth daily for 5 days.   ibuprofen 200 MG tablet Commonly known as:  ADVIL,MOTRIN Take 200 mg by mouth every 6 (six) hours as needed.   lactase 3000 units tablet Commonly known as:  LACTAID Take 9,000 Units by mouth 3 (three) times daily with meals.   lisinopril 20 MG tablet Commonly known as:  PRINIVIL,ZESTRIL Take 40 mg by mouth daily.   Melatonin 10 MG Tabs Take 10 mg by mouth at bedtime.   mirtazapine 30 MG tablet Commonly known as:  REMERON Take 30 mg by mouth at bedtime.   omeprazole 20 MG capsule Commonly known as:  PRILOSEC Take 40 mg by mouth 2 (two) times daily before a meal.   oxyCODONE 5 MG immediate release tablet Commonly known as:  Oxy IR/ROXICODONE Take 1 tablet (5 mg total) by mouth every 4 (four) hours as  needed for severe pain.   Pancrelipase (Lip-Prot-Amyl) 24000-76000 units Cpep Take 24,000-48,000 Units by mouth 3 (three) times daily before meals. Take 2 capsules by mouth three times daily with meals and 1 capsule by mouth with snacks.   polyethylene glycol packet Commonly known as:  MIRALAX / GLYCOLAX Take 17 g by mouth 2 (two) times daily.   pregabalin 200 MG capsule Commonly known as:  LYRICA Take 200 mg by mouth 3 (three) times daily.   promethazine 12.5 MG tablet Commonly known as:  PHENERGAN Take 1 tablet (12.5 mg total) by mouth every 6 (six) hours as needed for nausea or vomiting. What changed:    when to take this  reasons to take this  Another medication with the same name was removed. Continue taking this medication, and follow the directions you see here.   senna-docusate 8.6-50 MG tablet Commonly known as:  Senokot-S Take 2 tablets by mouth 2 (two) times daily.   Simethicone 180 MG Caps  Take 1 capsule by mouth as needed.            Durable Medical Equipment  (From admission, onward)         Start     Ordered   12/04/17 1051  For home use only DME Walker rolling  Once    Question:  Patient needs a walker to treat with the following condition  Answer:  Weakness   12/04/17 1050   12/04/17 0000  For home use only DME 4 wheeled rolling walker with seat    Question:  Patient needs a walker to treat with the following condition  Answer:  Generalized weakness   12/04/17 1045           DISCHARGE INSTRUCTIONS:  1.  Follow-up with PCP in 1 to 2 weeks 2.  Chlorthalidone dose increased from 25 mg to 50 mg daily 3. Added Norvasc 10 mg daily 4.  Triglycerides were 632.  Discharged on fenofibrate.  Needs triglycerides rechecked in 6 weeks 5.  Patient had some edema in her feet after receiving IV fluids.  Discharged on Lasix 20 mg p.o. daily for 5 days. 6. Zanaflex stopped on discharge DIET:  Cardiac diet DISCHARGE CONDITION:  Stable ACTIVITY:  Activity  as tolerated OXYGEN:  Home Oxygen: No.  Oxygen Delivery: room air DISCHARGE LOCATION:  home   If you experience worsening of your admission symptoms, develop shortness of breath, life threatening emergency, suicidal or homicidal thoughts you must seek medical attention immediately by calling 911 or calling your MD immediately  if symptoms less severe.  You Must read complete instructions/literature along with all the possible adverse reactions/side effects for all the Medicines you take and that have been prescribed to you. Take any new Medicines after you have completely understood and accpet all the possible adverse reactions/side effects.   Please note  You were cared for by a hospitalist during your hospital stay. If you have any questions about your discharge medications or the care you received while you were in the hospital after you are discharged, you can call the unit and asked to speak with the hospitalist on call if the hospitalist that took care of you is not available. Once you are discharged, your primary care physician will handle any further medical issues. Please note that NO REFILLS for any discharge medications will be authorized once you are discharged, as it is imperative that you return to your primary care physician (or establish a relationship with a primary care physician if you do not have one) for your aftercare needs so that they can reassess your need for medications and monitor your lab values.    On the day of Discharge:  VITAL SIGNS:  Blood pressure 93/75, pulse 75, temperature 97.6 F (36.4 C), temperature source Oral, resp. rate 20, height 5\' 7"  (1.702 m), weight 95.7 kg, SpO2 99 %. PHYSICAL EXAMINATION:  GENERAL:  42 y.o.-year-old patient lying in the bed with no acute distress.  EYES: Pupils equal, round, reactive to light and accommodation. No scleral icterus. Extraocular muscles intact.  HEENT: Head atraumatic, normocephalic. Oropharynx and nasopharynx  clear.  NECK:  Supple, no jugular venous distention. No thyroid enlargement, no tenderness.  LUNGS: Normal breath sounds bilaterally, no wheezing, rales,rhonchi or crepitation. No use of accessory muscles of respiration.  CARDIOVASCULAR: S1, S2 normal. No murmurs, rubs, or gallops.  ABDOMEN: Soft, non-distended. +mild generalized tenderness to palpation, Bowel sounds present. No organomegaly or mass.  EXTREMITIES: No pedal edema, cyanosis, or clubbing.  NEUROLOGIC: Cranial nerves II through XII are intact. Muscle strength 5/5 in all extremities. Sensation intact. Gait not checked.  PSYCHIATRIC: The patient is alert and oriented x 3.  SKIN: No obvious rash, lesion, or ulcer.  DATA REVIEW:   CBC Recent Labs  Lab 12/04/17 0338  WBC 9.7  HGB 11.3*  HCT 36.4  PLT 324    Chemistries  Recent Labs  Lab 12/02/17 0345  12/04/17 0338  NA 143   < > 139  K 3.4*   < > 3.2*  CL 106   < > 99  CO2 28   < > 31  GLUCOSE 96   < > 129*  BUN 9   < > 13  CREATININE 0.79   < > 0.93  CALCIUM 8.3*   < > 8.7*  MG  --   --  1.5*  AST 37  --   --   ALT 19  --   --   ALKPHOS 57  --   --   BILITOT 0.7  --   --    < > = values in this interval not displayed.     Microbiology Results  Results for orders placed or performed during the hospital encounter of 11/28/17  MRSA PCR Screening     Status: None   Collection Time: 11/28/17  7:38 AM  Result Value Ref Range Status   MRSA by PCR NEGATIVE NEGATIVE Final    Comment:        The GeneXpert MRSA Assay (FDA approved for NASAL specimens only), is one component of a comprehensive MRSA colonization surveillance program. It is not intended to diagnose MRSA infection nor to guide or monitor treatment for MRSA infections. Performed at [facility], [address] [place], [place] [address]     RADIOLOGY:  No results found.   Management plans discussed with the patient, family and they are in agreement.  CODE STATUS: Full Code    TOTAL TIME TAKING CARE OF THIS PATIENT: 40 minutes.    Jinny Blossom Mayo M.D on 12/04/2017 at 2:47 PM  Between 7am to 6pm - Pager - [phone]  After 6pm go to [email or web address] - Social research officer, government  [facility]  Office  [phone]  CC: Primary care physician; Thomes Dinning, MD   Note: This dictation was prepared with Dragon dictation along with smaller phrase technology. Any transcriptional errors that result from this process are unintentional.

## 2017-12-04 NOTE — Plan of Care (Signed)

## 2017-12-04 NOTE — Care Management Note (Signed)
Case Management Note  Patient Details  Name: Terri Berry MRN: [id number] Date of Birth: June 01, 1975   Patient to discharge today.  Patient lives at home with father.  PCP Weeks.  Follow up appointment has been made.  Patient denies issues obtaining medications, or issues with transport ion.  States that she is affiliated with [facility].  PT has assessed patient and recommends home health PT.  Patient declined services at discharge, however she is agreeable to RW.  RW delivered to room prior to discharge.  Father to transport at discharge.  Patient to [facility] at [facility]  Prescriptions have been faxed to [facility] . B12 and Senokot to be purchased over the counter.  Pain medication to be filled at a pharmacy of her choice.   Subjective/Objective:                    Action/Plan:   Expected Discharge Date:  12/04/17               Expected Discharge Plan:  Home/Self Care  In-House Referral:     Discharge planning Services     Post Acute Care Choice:  Durable Medical Equipment Choice offered to:     DME Arranged:  Walker rolling DME Agency:  [facility]  HH Arranged:  Patient Refused [facility] HH Agency:     Status of Service:  Completed, signed off  If discussed at [employer] of Stay Meetings, dates discussed:    Additional Comments:  Chapman Fitch, RN 12/04/2017, 3:51 PM

## 2017-12-04 NOTE — Progress Notes (Signed)
Physical Therapy Treatment Patient Details Name: Terri Berry MRN: [id number] DOB: [birth year] Today's Date: 12/04/2017    History of Present Illness Pt is a 42 y.o. female presenting to hospital 11/28/17 after being found lying on ground unresponsive by her father; pt also noted to be hypoxic.  Of note, pt with recent ED visit 11/15/17 for polypharmacy with unintentional overdose.  On this hospital admission, pt intubated but now extubated.  Pt admitted with AMS with drug overdose, AKI with hyperkalemia, acute hypoxic respiratory failure, and acute pancreatitis.  PMH includes obesity, DM, htn, IBS, fibromyalgia, and depression.     PT Comments    Pt agrees to gait.  Bed mobility slow but without assist.  Stood with supervision, took several small steps without walker but used counter and bed for support.  Initially declined walker but agreed once moving.  She was able to walk 17' with walker and slow cautious gait but no LOB or buckling.  She agreed that she would benefit from a walker use upon discharge and relayed to discharge planner.  Gait limited by LE soreness and weakness.   Follow Up Recommendations  Home health PT     Equipment Recommendations  Rolling walker with 5" wheels    Recommendations for Other Services       Precautions / Restrictions Precautions Precautions: Fall Restrictions Weight Bearing Restrictions: No    Mobility  Bed Mobility Overal bed mobility: Modified Independent                Transfers Overall transfer level: Modified independent Equipment used: None   Sit to Stand: Modified independent (Device/Increase time)            Ambulation/Gait Ambulation/Gait assistance: Supervision;Min guard Gait Distance (Feet): 90 Feet Assistive device: Rolling walker (2 wheeled) Gait Pattern/deviations: Step-through pattern Gait velocity: decreased   General Gait Details: gait slow and cautious initially without device.  Improved with  RW.   Stairs             Wheelchair Mobility    Modified Rankin (Stroke Patients Only)       Balance Overall balance assessment: Needs assistance Sitting-balance support: No upper extremity supported;Feet supported Sitting balance-Leahy Scale: Good     Standing balance support: Bilateral upper extremity supported Standing balance-Leahy Scale: Fair Standing balance comment: Poor gait with no support.  Improved with walker but remains slow and steady.                            Cognition Arousal/Alertness: Awake/alert Behavior During Therapy: WFL for tasks assessed/performed Overall Cognitive Status: Within Functional Limits for tasks assessed                                        Exercises      General Comments        Pertinent Vitals/Pain Pain Assessment: No/denies pain    Home Living                      Prior Function            PT Goals (current goals can now be found in the care plan section) Progress towards PT goals: Progressing toward goals    Frequency    Min 2X/week      PT Plan Current plan remains appropriate    Co-evaluation  AM-PAC PT "6 Clicks" Daily Activity  Outcome Measure  Difficulty turning over in bed (including adjusting bedclothes, sheets and blankets)?: None Difficulty moving from lying on back to sitting on the side of the bed? : None Difficulty sitting down on and standing up from a chair with arms (e.g., wheelchair, bedside commode, etc,.)?: None Help needed moving to and from a bed to chair (including a wheelchair)?: A Little Help needed walking in hospital room?: A Little Help needed climbing 3-5 steps with a railing? : A Little 6 Click Score: 21    End of Session Equipment Utilized During Treatment: Gait belt Activity Tolerance: Patient limited by fatigue Patient left: in bed;with call bell/phone within reach;with bed alarm set Nurse Communication:  Mobility status       Time: 1610-9604 PT Time Calculation (min) (ACUTE ONLY): 13 min  Charges:  $Gait Training: 8-22 mins                    Danielle Dess, PTA 12/04/17, 10:48 AM

## 2017-12-23 ENCOUNTER — Other Ambulatory Visit: Payer: Self-pay | Admitting: Internal Medicine

## 2018-02-09 ENCOUNTER — Inpatient Hospital Stay: Payer: Medicaid Other

## 2018-02-09 ENCOUNTER — Emergency Department: Payer: Medicaid Other

## 2018-02-09 ENCOUNTER — Encounter: Payer: Self-pay | Admitting: Internal Medicine

## 2018-02-09 ENCOUNTER — Other Ambulatory Visit: Payer: Self-pay

## 2018-02-09 ENCOUNTER — Inpatient Hospital Stay
Admission: EM | Admit: 2018-02-09 | Discharge: 2018-02-15 | DRG: 438 | Disposition: A | Discharge status: Home Health | Payer: Medicaid Other | Attending: Internal Medicine | Admitting: Internal Medicine

## 2018-02-09 DIAGNOSIS — M797 Fibromyalgia: Secondary | ICD-10-CM | POA: Diagnosis present

## 2018-02-09 DIAGNOSIS — K861 Other chronic pancreatitis: Secondary | ICD-10-CM | POA: Diagnosis present

## 2018-02-09 DIAGNOSIS — E781 Pure hyperglyceridemia: Secondary | ICD-10-CM | POA: Diagnosis present

## 2018-02-09 DIAGNOSIS — Z6832 Body mass index (BMI) 32.0-32.9, adult: Secondary | ICD-10-CM | POA: Diagnosis not present

## 2018-02-09 DIAGNOSIS — K859 Acute pancreatitis without necrosis or infection, unspecified: Secondary | ICD-10-CM

## 2018-02-09 DIAGNOSIS — Z9911 Dependence on respirator [ventilator] status: Secondary | ICD-10-CM

## 2018-02-09 DIAGNOSIS — Z4659 Encounter for fitting and adjustment of other gastrointestinal appliance and device: Secondary | ICD-10-CM

## 2018-02-09 DIAGNOSIS — Z881 Allergy status to other antibiotic agents status: Secondary | ICD-10-CM | POA: Diagnosis not present

## 2018-02-09 DIAGNOSIS — E876 Hypokalemia: Secondary | ICD-10-CM | POA: Diagnosis not present

## 2018-02-09 DIAGNOSIS — Z9049 Acquired absence of other specified parts of digestive tract: Secondary | ICD-10-CM | POA: Diagnosis not present

## 2018-02-09 DIAGNOSIS — J9601 Acute respiratory failure with hypoxia: Secondary | ICD-10-CM | POA: Diagnosis present

## 2018-02-09 DIAGNOSIS — G8929 Other chronic pain: Secondary | ICD-10-CM | POA: Diagnosis present

## 2018-02-09 DIAGNOSIS — Z79899 Other long term (current) drug therapy: Secondary | ICD-10-CM | POA: Diagnosis not present

## 2018-02-09 DIAGNOSIS — E1165 Type 2 diabetes mellitus with hyperglycemia: Secondary | ICD-10-CM | POA: Diagnosis present

## 2018-02-09 DIAGNOSIS — Y69 Unspecified misadventure during surgical and medical care: Secondary | ICD-10-CM

## 2018-02-09 DIAGNOSIS — Z882 Allergy status to sulfonamides status: Secondary | ICD-10-CM | POA: Diagnosis not present

## 2018-02-09 DIAGNOSIS — E669 Obesity, unspecified: Secondary | ICD-10-CM | POA: Diagnosis present

## 2018-02-09 DIAGNOSIS — Z7951 Long term (current) use of inhaled steroids: Secondary | ICD-10-CM | POA: Diagnosis not present

## 2018-02-09 DIAGNOSIS — K580 Irritable bowel syndrome with diarrhea: Secondary | ICD-10-CM | POA: Diagnosis present

## 2018-02-09 DIAGNOSIS — Z0189 Encounter for other specified special examinations: Secondary | ICD-10-CM

## 2018-02-09 DIAGNOSIS — G9341 Metabolic encephalopathy: Secondary | ICD-10-CM | POA: Diagnosis present

## 2018-02-09 DIAGNOSIS — E860 Dehydration: Secondary | ICD-10-CM

## 2018-02-09 DIAGNOSIS — K858 Other acute pancreatitis without necrosis or infection: Secondary | ICD-10-CM | POA: Diagnosis present

## 2018-02-09 DIAGNOSIS — Z888 Allergy status to other drugs, medicaments and biological substances status: Secondary | ICD-10-CM

## 2018-02-09 DIAGNOSIS — N179 Acute kidney failure, unspecified: Secondary | ICD-10-CM

## 2018-02-09 DIAGNOSIS — I10 Essential (primary) hypertension: Secondary | ICD-10-CM | POA: Diagnosis present

## 2018-02-09 DIAGNOSIS — R001 Bradycardia, unspecified: Secondary | ICD-10-CM

## 2018-02-09 DIAGNOSIS — E785 Hyperlipidemia, unspecified: Secondary | ICD-10-CM | POA: Diagnosis present

## 2018-02-09 DIAGNOSIS — F17200 Nicotine dependence, unspecified, uncomplicated: Secondary | ICD-10-CM | POA: Diagnosis present

## 2018-02-09 DIAGNOSIS — R509 Fever, unspecified: Secondary | ICD-10-CM

## 2018-02-09 LAB — CBC WITH DIFFERENTIAL/PLATELET
Abs Immature Granulocytes: 0.14 10*3/uL — ABNORMAL HIGH (ref 0.00–0.07)
BASOS ABS: 0.2 10*3/uL — AB (ref 0.0–0.1)
Basophils Relative: 1 %
EOS ABS: 0.4 10*3/uL (ref 0.0–0.5)
EOS PCT: 3 %
HCT: 39.4 % (ref 36.0–46.0)
HEMOGLOBIN: 12 g/dL (ref 12.0–15.0)
IMMATURE GRANULOCYTES: 1 %
LYMPHS PCT: 18 %
Lymphs Abs: 3.1 10*3/uL (ref 0.7–4.0)
MCH: 26.8 pg (ref 26.0–34.0)
MCHC: 30.5 g/dL (ref 30.0–36.0)
MCV: 87.9 fL (ref 80.0–100.0)
Monocytes Absolute: 1.6 10*3/uL — ABNORMAL HIGH (ref 0.1–1.0)
Monocytes Relative: 9 %
NEUTROS PCT: 68 %
NRBC: 0 % (ref 0.0–0.2)
Neutro Abs: 11.6 10*3/uL — ABNORMAL HIGH (ref 1.7–7.7)
Platelets: 346 10*3/uL (ref 150–400)
RBC: 4.48 MIL/uL (ref 3.87–5.11)
RDW: 18 % — AB (ref 11.5–15.5)
WBC: 17.1 10*3/uL — AB (ref 4.0–10.5)

## 2018-02-09 LAB — RENAL FUNCTION PANEL
Albumin: 3.9 g/dL (ref 3.5–5.0)
Anion gap: 7 (ref 5–15)
BUN: 27 mg/dL — AB (ref 6–20)
CHLORIDE: 109 mmol/L (ref 98–111)
CO2: 22 mmol/L (ref 22–32)
CREATININE: 1.7 mg/dL — AB (ref 0.44–1.00)
Calcium: 8 mg/dL — ABNORMAL LOW (ref 8.9–10.3)
GFR calc Af Amer: 42 mL/min — ABNORMAL LOW (ref >60)
GFR calc non Af Amer: 36 mL/min — ABNORMAL LOW (ref >60)
Glucose, Bld: 116 mg/dL — ABNORMAL HIGH (ref 70–99)
POTASSIUM: 4.7 mmol/L (ref 3.5–5.1)
Phosphorus: 3.4 mg/dL (ref 2.5–4.6)
Sodium: 138 mmol/L (ref 135–145)

## 2018-02-09 LAB — BLOOD GAS, ARTERIAL
Acid-base deficit: 8.7 mmol/L — ABNORMAL HIGH (ref 0.0–2.0)
Allens test (pass/fail): POSITIVE — AB
Bicarbonate: 17.9 mmol/L — ABNORMAL LOW (ref 20.0–28.0)
FIO2: 0.4
O2 Saturation: 94.1 %
PEEP: 5 cmH2O
Patient temperature: 37
RATE: 20 resp/min
VT: 500 mL
pCO2 arterial: 40 mmHg (ref 32.0–48.0)
pH, Arterial: 7.26 — ABNORMAL LOW (ref 7.350–7.450)
pO2, Arterial: 82 mmHg — ABNORMAL LOW (ref 83.0–108.0)

## 2018-02-09 LAB — COMPREHENSIVE METABOLIC PANEL
ALBUMIN: 4.5 g/dL (ref 3.5–5.0)
ALT: 14 U/L (ref 0–44)
ANION GAP: 10 (ref 5–15)
AST: 20 U/L (ref 15–41)
Alkaline Phosphatase: 50 U/L (ref 38–126)
BUN: 28 mg/dL — ABNORMAL HIGH (ref 6–20)
CHLORIDE: 110 mmol/L (ref 98–111)
CO2: 18 mmol/L — AB (ref 22–32)
Calcium: 8.8 mg/dL — ABNORMAL LOW (ref 8.9–10.3)
Creatinine, Ser: 2.05 mg/dL — ABNORMAL HIGH (ref 0.44–1.00)
GFR calc non Af Amer: 29 mL/min — ABNORMAL LOW (ref >60)
GFR, EST AFRICAN AMERICAN: 33 mL/min — AB (ref >60)
GLUCOSE: 155 mg/dL — AB (ref 70–99)
POTASSIUM: 4.7 mmol/L (ref 3.5–5.1)
SODIUM: 138 mmol/L (ref 135–145)
Total Bilirubin: 0.3 mg/dL (ref 0.3–1.2)
Total Protein: 8.1 g/dL (ref 6.5–8.1)

## 2018-02-09 LAB — URINALYSIS, COMPLETE (UACMP) WITH MICROSCOPIC
Bilirubin Urine: NEGATIVE
Glucose, UA: NEGATIVE mg/dL
Hgb urine dipstick: NEGATIVE
Ketones, ur: NEGATIVE mg/dL
LEUKOCYTES UA: NEGATIVE
Nitrite: NEGATIVE
PH: 5 (ref 5.0–8.0)
Protein, ur: 30 mg/dL — AB
SPECIFIC GRAVITY, URINE: 1.014 (ref 1.005–1.030)

## 2018-02-09 LAB — PROTEIN, URINE, RANDOM: TOTAL PROTEIN, URINE: 53 mg/dL

## 2018-02-09 LAB — GLUCOSE, CAPILLARY
GLUCOSE-CAPILLARY: 116 mg/dL — AB (ref 70–99)
Glucose-Capillary: 153 mg/dL — ABNORMAL HIGH (ref 70–99)
Glucose-Capillary: 116 mg/dL — ABNORMAL HIGH (ref 70–99)
Glucose-Capillary: 93 mg/dL (ref 70–99)
Glucose-Capillary: 78 mg/dL (ref 70–99)
Glucose-Capillary: 130 mg/dL — ABNORMAL HIGH (ref 70–99)
Glucose-Capillary: 115 mg/dL — ABNORMAL HIGH (ref 70–99)
Glucose-Capillary: 95 mg/dL (ref 70–99)
Glucose-Capillary: 109 mg/dL — ABNORMAL HIGH (ref 70–99)

## 2018-02-09 LAB — URINE DRUG SCREEN, QUALITATIVE (ARMC ONLY)
AMPHETAMINES, UR SCREEN: NOT DETECTED
Barbiturates, Ur Screen: NOT DETECTED
Benzodiazepine, Ur Scrn: NOT DETECTED
Cannabinoid 50 Ng, Ur ~~LOC~~: NOT DETECTED
Cocaine Metabolite,Ur ~~LOC~~: NOT DETECTED
MDMA (Ecstasy)Ur Screen: NOT DETECTED
Methadone Scn, Ur: NOT DETECTED
Opiate, Ur Screen: NOT DETECTED
Phencyclidine (PCP) Ur S: NOT DETECTED
Tricyclic, Ur Screen: POSITIVE — AB

## 2018-02-09 LAB — MRSA PCR SCREENING: MRSA by PCR: NEGATIVE

## 2018-02-09 LAB — SALICYLATE LEVEL: Salicylate Lvl: <7 mg/dL (ref 2.8–30.0)

## 2018-02-09 LAB — TRIGLYCERIDES: Triglycerides: 595 mg/dL — ABNORMAL HIGH (ref <150)

## 2018-02-09 LAB — NA AND K (SODIUM & POTASSIUM), RAND UR
Potassium Urine: 42 mmol/L
Sodium, Ur: 93 mmol/L

## 2018-02-09 LAB — ETHANOL

## 2018-02-09 LAB — ACETAMINOPHEN LEVEL: Acetaminophen (Tylenol), Serum: <10 ug/mL — ABNORMAL LOW (ref 10–30)

## 2018-02-09 LAB — CREATININE, URINE, RANDOM: Creatinine, Urine: 118 mg/dL

## 2018-02-09 LAB — BETA-HYDROXYBUTYRIC ACID: Beta-Hydroxybutyric Acid: 0.07 mmol/L (ref 0.05–0.27)

## 2018-02-09 LAB — LIPASE, BLOOD: Lipase: 4582 U/L — ABNORMAL HIGH (ref 11–51)

## 2018-02-09 MED ORDER — INSULIN ASPART 100 UNIT/ML ~~LOC~~ SOLN: 0.0000 [IU] | SUBCUTANEOUS | Status: DC

## 2018-02-09 MED ORDER — HALOPERIDOL LACTATE 5 MG/ML IJ SOLN
5.0000 mg | Freq: Once | INTRAMUSCULAR | Status: AC
  Administered 2018-02-09: 5 mg via INTRAMUSCULAR
  Filled 2018-02-09: qty 1

## 2018-02-09 MED ORDER — NOREPINEPHRINE BITARTRATE 1 MG/ML IV SOLN
0.0000 ug/min | INTRAVENOUS | Status: DC
  Administered 2018-02-09: 15 ug/min via INTRAVENOUS
  Filled 2018-02-09: qty 4

## 2018-02-09 MED ORDER — FENOFIBRATE 160 MG PO TABS
160.0000 mg | ORAL_TABLET | Freq: Every day | ORAL | Status: DC
  Administered 2018-02-10 – 2018-02-15 (×6): 160 mg via ORAL
  Filled 2018-02-09 (×7): qty 1

## 2018-02-09 MED ORDER — AMLODIPINE BESYLATE 10 MG PO TABS: 10.0000 mg | ORAL_TABLET | Freq: Every day | ORAL | Status: DC

## 2018-02-09 MED ORDER — STERILE WATER FOR INJECTION IV SOLN
INTRAVENOUS | Status: AC
  Administered 2018-02-09: 10:00:00 via INTRAVENOUS
  Filled 2018-02-09: qty 850

## 2018-02-09 MED ORDER — INSULIN REGULAR(HUMAN) IN NACL 100-0.9 UT/100ML-% IV SOLN
INTRAVENOUS | Status: DC
  Filled 2018-02-09: qty 100

## 2018-02-09 MED ORDER — IPRATROPIUM-ALBUTEROL 0.5-2.5 (3) MG/3ML IN SOLN: 3.0000 mL | Freq: Four times a day (QID) | RESPIRATORY_TRACT | Status: DC | PRN

## 2018-02-09 MED ORDER — ACETAMINOPHEN 10 MG/ML IV SOLN
1000.0000 mg | Freq: Once | INTRAVENOUS | Status: AC
  Filled 2018-02-09: qty 100

## 2018-02-09 MED ORDER — DOPAMINE-DEXTROSE 3.2-5 MG/ML-% IV SOLN
0.0000 ug/kg/min | INTRAVENOUS | Status: DC
  Administered 2018-02-09: 10 ug/kg/min via INTRAVENOUS
  Filled 2018-02-09: qty 250

## 2018-02-09 MED ORDER — POLYETHYLENE GLYCOL 3350 17 G PO PACK
17.0000 g | PACK | Freq: Two times a day (BID) | ORAL | Status: DC
  Administered 2018-02-10 – 2018-02-11 (×3): 17 g via ORAL
  Filled 2018-02-09 (×4): qty 1

## 2018-02-09 MED ORDER — ATORVASTATIN CALCIUM 20 MG PO TABS
40.0000 mg | ORAL_TABLET | Freq: Every day | ORAL | Status: DC
  Administered 2018-02-10 – 2018-02-14 (×5): 40 mg via ORAL
  Filled 2018-02-09 (×5): qty 2

## 2018-02-09 MED ORDER — MIDAZOLAM HCL 2 MG/2ML IJ SOLN
2.0000 mg | INTRAMUSCULAR | Status: DC | PRN
  Administered 2018-02-09 – 2018-02-10 (×2): 2 mg via INTRAVENOUS
  Filled 2018-02-09 (×2): qty 2

## 2018-02-09 MED ORDER — SUCCINYLCHOLINE CHLORIDE 20 MG/ML IJ SOLN
200.0000 mg | Freq: Once | INTRAMUSCULAR | Status: AC
  Administered 2018-02-09: 200 mg via INTRAVENOUS

## 2018-02-09 MED ORDER — IOPAMIDOL (ISOVUE-300) INJECTION 61%
15.0000 mL | INTRAVENOUS | Status: AC
  Administered 2018-02-09 (×2): 15 mL via ORAL

## 2018-02-09 MED ORDER — IOHEXOL 300 MG/ML  SOLN
75.0000 mL | Freq: Once | INTRAMUSCULAR | Status: AC | PRN
  Administered 2018-02-09: 75 mL via INTRAVENOUS

## 2018-02-09 MED ORDER — INSULIN ASPART 100 UNIT/ML ~~LOC~~ SOLN: 0.0000 [IU] | Freq: Every day | SUBCUTANEOUS | Status: DC

## 2018-02-09 MED ORDER — SODIUM CHLORIDE 0.9 % IV BOLUS
1000.0000 mL | Freq: Once | INTRAVENOUS | Status: AC
  Administered 2018-02-09: 1000 mL via INTRAVENOUS

## 2018-02-09 MED ORDER — SENNOSIDES-DOCUSATE SODIUM 8.6-50 MG PO TABS: 1.0000 | ORAL_TABLET | Freq: Every evening | ORAL | Status: DC | PRN

## 2018-02-09 MED ORDER — SODIUM CHLORIDE 0.9 % IV SOLN
3.0000 g | Freq: Once | INTRAVENOUS | Status: AC
  Administered 2018-02-09: 3 g via INTRAVENOUS
  Filled 2018-02-09: qty 3

## 2018-02-09 MED ORDER — PANCRELIPASE (LIP-PROT-AMYL) 12000-38000 UNITS PO CPEP: 24000.0000 [IU] | ORAL_CAPSULE | Freq: Three times a day (TID) | ORAL | Status: DC

## 2018-02-09 MED ORDER — PANCRELIPASE (LIP-PROT-AMYL) 12000-38000 UNITS PO CPEP: 12000.0000 [IU] | ORAL_CAPSULE | ORAL | Status: DC | PRN

## 2018-02-09 MED ORDER — DEXMEDETOMIDINE HCL IN NACL 400 MCG/100ML IV SOLN
0.4000 ug/kg/h | INTRAVENOUS | Status: DC
  Administered 2018-02-09: 0.4 ug/kg/h via INTRAVENOUS
  Filled 2018-02-09: qty 100

## 2018-02-09 MED ORDER — ORAL CARE MOUTH RINSE
15.0000 mL | OROMUCOSAL | Status: DC
  Administered 2018-02-09 – 2018-02-10 (×15): 15 mL via OROMUCOSAL

## 2018-02-09 MED ORDER — INSULIN REGULAR(HUMAN) IN NACL 100-0.9 UT/100ML-% IV SOLN: INTRAVENOUS | Status: DC

## 2018-02-09 MED ORDER — DEXTROSE-NACL 5-0.45 % IV SOLN
INTRAVENOUS | Status: DC
  Administered 2018-02-09: 06:00:00 via INTRAVENOUS

## 2018-02-09 MED ORDER — ENOXAPARIN SODIUM 40 MG/0.4ML ~~LOC~~ SOLN
40.0000 mg | SUBCUTANEOUS | Status: DC
  Administered 2018-02-09 – 2018-02-14 (×6): 40 mg via SUBCUTANEOUS
  Filled 2018-02-09 (×6): qty 0.4

## 2018-02-09 MED ORDER — PANTOPRAZOLE SODIUM 40 MG IV SOLR
40.0000 mg | Freq: Every day | INTRAVENOUS | Status: DC
  Administered 2018-02-09: 40 mg via INTRAVENOUS
  Filled 2018-02-09 (×2): qty 40

## 2018-02-09 MED ORDER — ACETAMINOPHEN 325 MG PO TABS
650.0000 mg | ORAL_TABLET | ORAL | Status: DC | PRN
  Administered 2018-02-10 (×2): 650 mg via ORAL
  Filled 2018-02-09 (×2): qty 2

## 2018-02-09 MED ORDER — INSULIN REGULAR(HUMAN) IN NACL 100-0.9 UT/100ML-% IV SOLN
INTRAVENOUS | Status: DC
  Administered 2018-02-09: 4 [IU]/h via INTRAVENOUS

## 2018-02-09 MED ORDER — LACTATED RINGERS IV SOLN
INTRAVENOUS | Status: DC
  Administered 2018-02-09 – 2018-02-10 (×3): via INTRAVENOUS
  Administered 2018-02-10: 75 mL/h via INTRAVENOUS
  Administered 2018-02-11: 13:00:00 via INTRAVENOUS

## 2018-02-09 MED ORDER — CHLORHEXIDINE GLUCONATE 0.12% ORAL RINSE (MEDLINE KIT)
15.0000 mL | Freq: Two times a day (BID) | OROMUCOSAL | Status: DC
  Administered 2018-02-09 – 2018-02-10 (×3): 15 mL via OROMUCOSAL

## 2018-02-09 MED ORDER — KETAMINE HCL 10 MG/ML IJ SOLN
200.0000 mg | Freq: Once | INTRAMUSCULAR | Status: AC
  Administered 2018-02-09: 200 mg via INTRAVENOUS

## 2018-02-09 MED ORDER — FENTANYL 2500MCG IN NS 250ML (10MCG/ML) PREMIX INFUSION
0.0000 ug/h | INTRAVENOUS | Status: DC
  Administered 2018-02-09: 50 ug/h via INTRAVENOUS
  Administered 2018-02-09: 25 ug/h via INTRAVENOUS
  Administered 2018-02-09 (×2): 100 ug/h via INTRAVENOUS
  Filled 2018-02-09 (×2): qty 250

## 2018-02-09 MED ORDER — CARVEDILOL 12.5 MG PO TABS
25.0000 mg | ORAL_TABLET | Freq: Two times a day (BID) | ORAL | Status: DC
  Filled 2018-02-09: qty 2

## 2018-02-09 MED ORDER — KETOROLAC TROMETHAMINE 30 MG/ML IJ SOLN
15.0000 mg | Freq: Once | INTRAMUSCULAR | Status: AC
  Filled 2018-02-09: qty 1

## 2018-02-09 MED ORDER — FENTANYL CITRATE (PF) 100 MCG/2ML IJ SOLN
250.0000 ug | Freq: Once | INTRAMUSCULAR | Status: AC
  Administered 2018-02-09: 250 ug via INTRAVENOUS

## 2018-02-09 MED ORDER — INSULIN ASPART 100 UNIT/ML ~~LOC~~ SOLN: 0.0000 [IU] | Freq: Three times a day (TID) | SUBCUTANEOUS | Status: DC

## 2018-02-09 MED ORDER — PANTOPRAZOLE SODIUM 40 MG PO TBEC: 40.0000 mg | DELAYED_RELEASE_TABLET | Freq: Every day | ORAL | Status: DC

## 2018-02-09 MED ORDER — BISACODYL 5 MG PO TBEC: 5.0000 mg | DELAYED_RELEASE_TABLET | Freq: Every day | ORAL | Status: DC | PRN

## 2018-02-09 MED ORDER — HYDROMORPHONE HCL 1 MG/ML IJ SOLN
2.0000 mg | Freq: Once | INTRAMUSCULAR | Status: AC
  Administered 2018-02-09: 2 mg via INTRAMUSCULAR
  Filled 2018-02-09: qty 2

## 2018-02-09 NOTE — H&P (Signed)
[facility] [place] [facility]   PATIENT NAME: Terri Berry    MR#:  [id number]  DATE OF BIRTH:  Dec 06, 1975  DATE OF ADMISSION:  02/09/2018  PRIMARY CARE PHYSICIAN: Thomes DinningWeeks, Cynthia, MD   REQUESTING/REFERRING PHYSICIAN: Merrily Brittleifenbark, Neil, MD  CHIEF COMPLAINT:   Chief Complaint  Patient presents with  . Abdominal Pain    HISTORY OF PRESENT ILLNESS:  Terri Berry  is a 43 y.o. female with a known history of obesity, T2NIDDM, HTN, HLD, IBS, fibromyalgia, depression, p/w overdose. I am told pt initially p/w c/o AP/N/V/D, but became progressively more altered while in the ED, vacillating between agitated delirium and lethargy/obtundation. I am also informed that pt had copious nasopharyngeal and upper airway secretions, and was not protecting her airway. She was intubated in the ED, and is sedated at the time of my assessment; she is, as such, not able to provide any Hx/ROS. As it happens, however, I admitted the same pt under very similar circumstance ~6763mo ago (11/28/2017), at which time she was also intubated. She had an ED visit on 11/15/2017 for polypharmacy w/ unintentional overdose of Lyrica and muscle relaxants. Despite multiple admissions, it would appear she has not yet learned her lesson regarding polypharmacy. Her medication list includes Klonopin, Prozac, Remeron, Oxycodone and Lyrica, in addition to diuretics/antihypertensives, antiemetics and a variety of other prescription medications. EtOH, Acetaminophen, Salicylate (-). UTox pending.  Other issues: -AKI: Cr 2.05 on present admission. Baseline Cr 0.7-0.9. -Hypertriglyceridemia: TAG 595. Lipase pending.  PAST MEDICAL HISTORY:   Past Medical History:  Diagnosis Date  . Hypertension   . IBS (irritable bowel syndrome)     PAST SURGICAL HISTORY:  History reviewed. No pertinent surgical history.  SOCIAL HISTORY:   Social History   Tobacco Use  . Smoking status: Current Every Day Smoker  .  Smokeless tobacco: Never Used  Substance Use Topics  . Alcohol use: Not Currently    FAMILY HISTORY:  History reviewed. No pertinent family history.  DRUG ALLERGIES:   Allergies  Allergen Reactions  . Cefpodoxime   . Clarithromycin   . Doxycycline   . Ferrous Gluconate   . Lactose Intolerance (Gi)   . Levofloxacin   . Rizatriptan   . Sulfa Antibiotics   . Topiramate   . Venlafaxine   . Zofran [Ondansetron Hcl]     REVIEW OF SYSTEMS:   Review of Systems  Unable to perform ROS: Intubated  Gastrointestinal: Positive for abdominal pain, diarrhea, nausea and vomiting.   MEDICATIONS AT HOME:   Prior to Admission medications   Medication Sig Start Date End Date Taking? Authorizing Provider  Alpha-D-Galactosidase (BEANO) TABS Take 100 mg by mouth as needed.    [provider]  amLODipine (NORVASC) 10 MG tablet Take 1 tablet (10 mg total) by mouth daily. 12/05/17   Mayo, Allyn KennerKaty Dodd, MD  atorvastatin (LIPITOR) 40 MG tablet Take 40 mg by mouth daily.    [provider]  carvedilol (COREG) 25 MG tablet Take 25 mg by mouth 2 (two) times daily with a meal.    [provider]  chlorthalidone (HYGROTON) 50 MG tablet Take 1 tablet (50 mg total) by mouth daily. 12/04/17   Mayo, Allyn KennerKaty Dodd, MD  clonazePAM (KLONOPIN) 1 MG tablet Take 1 mg by mouth 3 (three) times daily as needed for anxiety.    [provider]  cyanocobalamin 1000 MCG tablet Take 1 tablet (1,000 mcg total) by mouth daily. 12/05/17   Mayo, Allyn KennerKaty Dodd, MD  fenofibrate (TRICOR) 145 MG tablet Take 1 tablet (145 mg total) by mouth daily. 12/04/17 01/03/18  Mayo, Allyn Kenner, MD  FLUoxetine (PROZAC) 20 MG capsule Take 40 mg by mouth daily.    [provider]  fluticasone (FLONASE) 50 MCG/ACT nasal spray Place 2 sprays into both nostrils 2 (two) times daily.    [provider]  furosemide (LASIX) 20 MG tablet Take 1 tablet (20 mg total) by mouth daily for 5 days. 12/04/17 12/09/17   Mayo, Allyn Kenner, MD  ibuprofen (ADVIL,MOTRIN) 200 MG tablet Take 200 mg by mouth every 6 (six) hours as needed.    [provider]  lactase (LACTAID) 3000 units tablet Take 9,000 Units by mouth 3 (three) times daily with meals.    [provider]  lisinopril (PRINIVIL,ZESTRIL) 20 MG tablet Take 40 mg by mouth daily.     [provider]  Melatonin 10 MG TABS Take 10 mg by mouth at bedtime.    [provider]  mirtazapine (REMERON) 30 MG tablet Take 30 mg by mouth at bedtime.    [provider]  omeprazole (PRILOSEC) 20 MG capsule Take 40 mg by mouth 2 (two) times daily before a meal.    [provider]  oxyCODONE (OXY IR/ROXICODONE) 5 MG immediate release tablet Take 1 tablet (5 mg total) by mouth every 4 (four) hours as needed for severe pain. 12/04/17   Mayo, Allyn Kenner, MD  Pancrelipase, Lip-Prot-Amyl, 24000-76000 units CPEP Take 24,000-48,000 Units by mouth 3 (three) times daily before meals. Take 2 capsules by mouth three times daily with meals and 1 capsule by mouth with snacks.    [provider]  polyethylene glycol (MIRALAX / GLYCOLAX) packet Take 17 g by mouth 2 (two) times daily.    [provider]  pregabalin (LYRICA) 200 MG capsule Take 200 mg by mouth 3 (three) times daily.    [provider]  promethazine (PHENERGAN) 12.5 MG tablet Take 1 tablet (12.5 mg total) by mouth every 6 (six) hours as needed for nausea or vomiting. 12/04/17   Mayo, Allyn Kenner, MD  senna-docusate (SENOKOT-S) 8.6-50 MG tablet Take 2 tablets by mouth 2 (two) times daily. 12/04/17   Mayo, Allyn Kenner, MD  Simethicone 180 MG CAPS Take 1 capsule by mouth as needed.    [provider]      VITAL SIGNS:  Blood pressure (!) 86/61, pulse (!) 45, temperature (!) 97.3 F (36.3 C), temperature source Oral, resp. rate (!) 21, height 5\' 7"  (1.702 m), weight 90.7 kg, SpO2 99 %.  PHYSICAL EXAMINATION:  Physical Exam Constitutional:       General: She is sleeping. She is not in acute distress.    Appearance: Normal appearance. She is well-developed. She is obese. She is not ill-appearing, toxic-appearing or diaphoretic.     Interventions: She is sedated and intubated.  HENT:     Head: Atraumatic.  Eyes:     General: Lids are normal. No scleral icterus.    Conjunctiva/sclera: Conjunctivae normal.  Neck:     Musculoskeletal: Neck supple.  Cardiovascular:     Rate and Rhythm: Regular rhythm. Bradycardia present.  No extrasystoles are present.    Heart sounds: Normal heart sounds, S1 normal and S2 normal. Heart sounds not distant. No murmur. No friction rub. No gallop. No S3 or S4 sounds.   Pulmonary:     Effort: Tachypnea present. No bradypnea, accessory muscle usage, prolonged expiration, respiratory distress or retractions. She is intubated.  Breath sounds: Normal air entry. No stridor, decreased air movement or transmitted upper airway sounds. Examination of the right-upper field reveals decreased breath sounds and rales. Examination of the left-upper field reveals decreased breath sounds and rales. Examination of the right-middle field reveals decreased breath sounds and rales. Examination of the left-middle field reveals decreased breath sounds and rales. Examination of the right-lower field reveals decreased breath sounds and rales. Examination of the left-lower field reveals decreased breath sounds and rales. Decreased breath sounds and rales present. No wheezing or rhonchi.  Abdominal:     General: Bowel sounds are decreased. There is no distension.     Palpations: Abdomen is soft.     Tenderness: There is no abdominal tenderness. There is no guarding or rebound.  Musculoskeletal:        General: No swelling.     Right lower leg: No edema.     Left lower leg: No edema.  Lymphadenopathy:     Cervical: No cervical adenopathy.  Skin:    General: Skin is warm and dry.     Findings: No erythema or rash.  Neurological:       Mental Status: She is unresponsive.     Comments: Intubated/sedated.  Psychiatric:        Attention and Perception: She is inattentive.        Speech: She is noncommunicative.     Comments: Intubated/sedated.    Scattered fine crackles. LABORATORY PANEL:   CBC Recent Labs  Lab 02/09/18 0536  WBC 17.1*  HGB 12.0  HCT 39.4  PLT 346   ------------------------------------------------------------------------------------------------------------------  Chemistries  Recent Labs  Lab 02/09/18 0536  NA 138  K 4.7  CL 110  CO2 18*  GLUCOSE 155*  BUN 28*  CREATININE 2.05*  CALCIUM 8.8*  AST 20  ALT 14  ALKPHOS 50  BILITOT 0.3   ------------------------------------------------------------------------------------------------------------------  Cardiac Enzymes No results for input(s): TROPONINI in the last 168 hours. ------------------------------------------------------------------------------------------------------------------  RADIOLOGY:  Dg Chest 1 View  Result Date: 02/09/2018 CLINICAL DATA:  Intubation EXAM: CHEST  1 VIEW COMPARISON:  11/29/2017 FINDINGS: Endotracheal tube tip is 2.5 cm above the carina. Nasogastric tube tip is at the gastroesophageal junction. There is mild bibasilar atelectasis. Upper lungs are clear. No effusions. IMPRESSION: Endotracheal tube well positioned. Nasogastric tube tip only at the gastroesophageal junction. Mild basilar atelectasis. Electronically Signed   By: Paulina FusiMark  Shogry M.D.   On: 02/09/2018 07:18   IMPRESSION AND PLAN:   A/P: 60F w/ PMHx obesity, T2NIDDM, HTN, HLD, IBS, fibromyalgia, depression, p/w AP/N/V/D, overdose, AMS (agitated delirium, lethargy/somnolenece), acute hypoxemic respiratory failure. Excess secretions, unable to protect airway, intubated. AKI, hyperglycemia (w/ T2NIDDM), hypocalcemia, hypertriglyceridemia, leukocytosis. -Overdose, AMS (agitated delirium, lethargy/somnolenece), acute hypoxemic respiratory failure,  intubated: Intubated in ED. EtOH, Acetaminophen, Salicylate (-). UTox pending. Holding majority of pt's medications. EKG sinus bradycardia, QTc 416. Cardiac monitoring, pulse ox. IVF, supportive care. -AKI: Cr 2.05 on present admission. Baseline Cr 0.7-0.9 based on review of prior labs. Suspect prerenal AKI. IVF. Renal U/S, urine studies (electrolytes, creatinine, urea nitrogen) pending. Monitor BMP, avoid nephrotoxins. Holding ACE-I, diuretics. -Hyperglycemia, T2NIDDM: SSI. -Hypocalcemia: Ionized calcium. -Hypertriglyceridemia, AP/N/V/D: TAG 595. Suspect hypertriglyceridemia pancreatitis, acute on chronic pancreatitis. Lipase pending. NPO, IVF. Sedated w/ Ketamine + Precedex + Fentanyl (avoid Propofol). -Leukocytosis: Likely reactive, suspected to be 2/2 stress/demargination. Concern for aspiration, single dose Unasyn ordered. CXR (-) infiltrate/consolidation. U/A pending. -c/w other home meds/formulary subs. -FEN/GI: NPO. -DVT PPx: Lovenox. -Code status: Full code. -Disposition: Admission, > 2  midnights.   All the records are reviewed and case discussed with ED provider. Management plans discussed with the patient, family and they are in agreement.  CODE STATUS: Full code.  TOTAL TIME TAKING CARE OF THIS PATIENT: 75 minutes.    Barbaraann Rondo M.D on 02/09/2018 at 7:36 AM  Between 7am to 6pm - Pager - [phone]  After 6pm go to [email or web address] - Social research officer, government  [facility]  Office  [phone]  CC: Primary care physician; Thomes Dinning, MD   Note: This dictation was prepared with Dragon dictation along with smaller phrase technology. Any transcriptional errors that result from this process are unintentional.

## 2018-02-09 NOTE — ED Notes (Signed)
Pt placed on 2lpm oxygen via [place] at 0610.

## 2018-02-09 NOTE — ED Notes (Signed)
Pt falling asleep during conversation and has minimal response to sternal rub. md at bedside, decision to intubate pt made. RT called.

## 2018-02-09 NOTE — ED Provider Notes (Signed)
[facility] Emergency Department Provider Note  ____________________________________________   First MD Initiated Contact with Patient 02/09/18 [phone]     (approximate)  I have reviewed the triage vital signs and the nursing notes.   HISTORY  Chief Complaint Abdominal Pain  Level 5 exemption history is limited by the patient's critical illness  HPI Terri Berry is a 43 y.o. female who comes to the emergency department via EMS with  sudden onset severe nausea vomiting and abdominal pain that began about 30 minutes prior to arrival.  She has a past medical history of chronic pancreatitis secondary to hypertriglyceridemia.  She also has a longstanding history of chronic pain for which she takes multiple medications daily.  Her symptoms today were sudden onset severe epigastric radiating straight to her back and nothing seems to make them better or worse.  She was given no medications en route.   Past Medical History:  Diagnosis Date  . Hypertension   . IBS (irritable bowel syndrome)     Patient Active Problem List   Diagnosis Date Noted  . Acute hypoxemic respiratory failure ([place]) 02/09/2018  . Overdose 11/28/2017    History reviewed. No pertinent surgical history.  Prior to Admission medications   Medication Sig Start Date End Date Taking? Authorizing Provider  Alpha-D-Galactosidase (BEANO) TABS Take 100 mg by mouth as needed.    [provider]  amLODipine (NORVASC) 10 MG tablet Take 1 tablet (10 mg total) by mouth daily. 12/05/17   Mayo, Pete Pelt, MD  atorvastatin (LIPITOR) 40 MG tablet Take 40 mg by mouth daily.    [provider]  carvedilol (COREG) 25 MG tablet Take 25 mg by mouth 2 (two) times daily with a meal.    [provider]  chlorthalidone (HYGROTON) 50 MG tablet Take 1 tablet (50 mg total) by mouth daily. 12/04/17   Mayo, Pete Pelt, MD  clonazePAM (KLONOPIN) 1 MG tablet Take 1 mg by mouth 3 (three) times daily  as needed for anxiety.    [provider]  cyanocobalamin 1000 MCG tablet Take 1 tablet (1,000 mcg total) by mouth daily. 12/05/17   Mayo, Pete Pelt, MD  fenofibrate (TRICOR) 145 MG tablet Take 1 tablet (145 mg total) by mouth daily. 12/04/17 01/03/18  Mayo, Pete Pelt, MD  FLUoxetine (PROZAC) 20 MG capsule Take 40 mg by mouth daily.    [provider]  fluticasone (FLONASE) 50 MCG/ACT nasal spray Place 2 sprays into both nostrils 2 (two) times daily.    [provider]  furosemide (LASIX) 20 MG tablet Take 1 tablet (20 mg total) by mouth daily for 5 days. 12/04/17 12/09/17  Mayo, Pete Pelt, MD  ibuprofen (ADVIL,MOTRIN) 200 MG tablet Take 200 mg by mouth every 6 (six) hours as needed.    [provider]  lactase (LACTAID) 3000 units tablet Take 9,000 Units by mouth 3 (three) times daily with meals.    [provider]  lisinopril (PRINIVIL,ZESTRIL) 20 MG tablet Take 40 mg by mouth daily.     [provider]  Melatonin 10 MG TABS Take 10 mg by mouth at bedtime.    [provider]  mirtazapine (REMERON) 30 MG tablet Take 30 mg by mouth at bedtime.    [provider]  omeprazole (PRILOSEC) 20 MG capsule Take 40 mg by mouth 2 (two) times daily before a meal.    [provider]  oxyCODONE (OXY IR/ROXICODONE) 5 MG immediate release tablet Take 1 tablet (5 mg  total) by mouth every 4 (four) hours as needed for severe pain. 12/04/17   Mayo, Pete Pelt, MD  Pancrelipase, Lip-Prot-Amyl, 24000-76000 units CPEP Take 24,000-48,000 Units by mouth 3 (three) times daily before meals. Take 2 capsules by mouth three times daily with meals and 1 capsule by mouth with snacks.    [provider]  polyethylene glycol (MIRALAX / GLYCOLAX) packet Take 17 g by mouth 2 (two) times daily.    [provider]  pregabalin (LYRICA) 200 MG capsule Take 200 mg by mouth 3 (three) times daily.    [provider]  promethazine  (PHENERGAN) 12.5 MG tablet Take 1 tablet (12.5 mg total) by mouth every 6 (six) hours as needed for nausea or vomiting. 12/04/17   Mayo, Pete Pelt, MD  senna-docusate (SENOKOT-S) 8.6-50 MG tablet Take 2 tablets by mouth 2 (two) times daily. 12/04/17   Mayo, Pete Pelt, MD  Simethicone 180 MG CAPS Take 1 capsule by mouth as needed.    [provider]    Allergies Cefpodoxime; Clarithromycin; Doxycycline; Ferrous gluconate; Lactose intolerance (gi); Levofloxacin; Rizatriptan; Sulfa antibiotics; Topiramate; Venlafaxine; and Zofran [ondansetron hcl]  History reviewed. No pertinent family history.  Social History Social History   Tobacco Use  . Smoking status: Current Every Day Smoker  . Smokeless tobacco: Never Used  Substance Use Topics  . Alcohol use: Not Currently  . Drug use: Not on file    Review of Systems Level 5 exemption history limited by the patient's clinical condition  ____________________________________________   PHYSICAL EXAM:  VITAL SIGNS: ED Triage Vitals  Enc Vitals Group     BP      Pulse      Resp      Temp      Temp src      SpO2      Weight      Height      Head Circumference      Peak Flow      Pain Score      Pain Loc      Pain Edu?      Excl. in [place]?     Constitutional: Appears extremely uncomfortable crying and moaning in pain although intermittently somewhat lethargic Eyes: PERRL EOMI. midrange and brisk Head: Atraumatic. Nose: No congestion/rhinnorhea. Mouth/Throat: No trismus Neck: No stridor.  No meningismus Cardiovascular: Bradycardic rate, regular rhythm. Grossly normal heart sounds.  Good peripheral circulation. Respiratory: Increased respiratory effort.  No retractions. Lungs CTAB and moving good air Gastrointestinal: Obese soft diffuse exquisite tenderness to even mild palpation.  No focality Musculoskeletal: No lower extremity edema   Neurologic:  . No gross focal neurologic deficits are appreciated. Skin:  Skin is  warm, dry and intact. No rash noted. Psychiatric: Somewhat obtunded    ____________________________________________   DIFFERENTIAL includes but not limited to  Hypertriglyceridemia, pancreatitis, drug overdose, dehydration, therapeutic misadventure ____________________________________________   LABS (all labs ordered are listed, but only abnormal results are displayed)  Labs Reviewed  COMPREHENSIVE METABOLIC PANEL - Abnormal; Notable for the following components:      Result Value   CO2 18 (*)    Glucose, Bld 155 (*)    BUN 28 (*)    Creatinine, Ser 2.05 (*)    Calcium 8.8 (*)    GFR calc non Af Amer 29 (*)    GFR calc Af Amer 33 (*)    All other components within normal limits  CBC WITH DIFFERENTIAL/PLATELET - Abnormal; Notable for the following components:  WBC 17.1 (*)    RDW 18.0 (*)    Neutro Abs 11.6 (*)    Monocytes Absolute 1.6 (*)    Basophils Absolute 0.2 (*)    Abs Immature Granulocytes 0.14 (*)    All other components within normal limits  URINALYSIS, COMPLETE (UACMP) WITH MICROSCOPIC - Abnormal; Notable for the following components:   Color, Urine YELLOW (*)    APPearance CLEAR (*)    Protein, ur 30 (*)    Bacteria, UA RARE (*)    All other components within normal limits  TRIGLYCERIDES - Abnormal; Notable for the following components:   Triglycerides 595 (*)    All other components within normal limits  GLUCOSE, CAPILLARY - Abnormal; Notable for the following components:   Glucose-Capillary 153 (*)    All other components within normal limits  ACETAMINOPHEN LEVEL - Abnormal; Notable for the following components:   Acetaminophen (Tylenol), Serum <10 (*)    All other components within normal limits  GLUCOSE, CAPILLARY - Abnormal; Notable for the following components:   Glucose-Capillary 116 (*)    All other components within normal limits  GLUCOSE, CAPILLARY - Abnormal; Notable for the following components:   Glucose-Capillary 116 (*)    All other  components within normal limits  BETA-HYDROXYBUTYRIC ACID  ETHANOL  SALICYLATE LEVEL  CREATININE, URINE, RANDOM  PROTEIN, URINE, RANDOM  NA AND K (SODIUM & POTASSIUM), RAND UR  LIPASE, BLOOD  URINE DRUG SCREEN, QUALITATIVE ([facility] ONLY)  UREA NITROGEN, URINE  MAGNESIUM  PHOSPHORUS  CALCIUM, IONIZED  BLOOD GAS, ARTERIAL    Lab work reviewed by me shows acute kidney injury with essentially doubling of her baseline creatinine.  Her triglycerides are quite high as well. __________________________________________  EKG  ED ECG REPORT I, Darel Hong, the attending physician, personally viewed and interpreted this ECG.  Date: 02/09/2018 EKG Time:  Rate: 54 Rhythm: normal sinus rhythm QRS Axis: normal Intervals: normal ST/T Wave abnormalities: normal Narrative Interpretation: no evidence of acute ischemia  ____________________________________________  RADIOLOGY  Chest x-ray reviewed by me shows endotracheal tube in good position although orogastric tube needs to be advanced ____________________________________________   PROCEDURES  Procedure(s) performed: Yes  .Critical Care Performed by: Darel Hong, MD Authorized by: Darel Hong, MD   Critical care provider statement:    Critical care time (minutes):  50   Critical care time was exclusive of:  Separately billable procedures and treating other patients   Critical care was necessary to treat or prevent imminent or life-threatening deterioration of the following conditions:  [place]oxid[place]   Critical care was time spent personally by me on the following activities:  Development of treatment plan with patient or surrogate, discussions with consultants, evaluation of patient's response to treatment, examination of patient, obtaining history from patient or surrogate, ordering and performing treatments and interventions, ordering and review of laboratory studies, ordering and review of radiographic studies, pulse oximetry,  re-evaluation of patient's condition and review of old charts Procedure Name: Intubation Date/Time: 02/09/2018 7:30 AM Performed by: Darel Hong, MD Pre-anesthesia Checklist: Patient identified, Patient being monitored, Emergency Drugs available, Timeout performed and Suction available Oxygen Delivery Method: Non-rebreather mask Preoxygenation: Pre-oxygenation with 100% oxygen Induction Type: Rapid sequence Ventilation: Mask ventilation without difficulty Laryngoscope Size: Mac and 4 Tube size: 8.0 mm Number of attempts: 1 Placement Confirmation: ETT inserted through vocal cords under direct vision,  CO2 detector and Breath sounds checked- equal and bilateral Secured at: 23 cm Tube secured with: ETT holder    OG placement  Date/Time: 02/09/2018 7:30 AM Performed by: Darel Hong, MD Authorized by: Darel Hong, MD  Consent: The procedure was performed in an emergent situation. Patient tolerance: Patient tolerated the procedure well with no immediate complications     Angiocath insertion Performed by: Darel Hong  Consent: Verbal consent obtained. Risks and benefits: risks, benefits and alternatives were discussed Time out: Immediately prior to procedure a "time out" was called to verify the correct patient, procedure, equipment, support staff and site/side marked as required.  Preparation: Patient was prepped and draped in the usual sterile fashion.  Vein Location: left AC  Ultrasound Guided  Gauge: 18  Normal blood return and flush without difficulty Patient tolerance: Patient tolerated the procedure well with no immediate complications.   Angiocath insertion Performed by: Darel Hong  Consent: Verbal consent obtained. Risks and benefits: risks, benefits and alternatives were discussed Time out: Immediately prior to procedure a "time out" was called to verify the correct patient, procedure, equipment, support staff and site/side marked as  required.  Preparation: Patient was prepped and draped in the usual sterile fashion.  Vein Location: left upper extremity  Ultrasound Guided  Gauge: 18  Normal blood return and flush without difficulty Patient tolerance: Patient tolerated the procedure well with no immediate complications.   Angiocath insertion Performed by: Darel Hong  Consent: Verbal consent obtained. Risks and benefits: risks, benefits and alternatives were discussed Time out: Immediately prior to procedure a "time out" was called to verify the correct patient, procedure, equipment, support staff and site/side marked as required.  Preparation: Patient was prepped and draped in the usual sterile fashion.  Vein Location: right AC  Ultrasound Guided  Gauge: 16  Normal blood return and flush without difficulty Patient tolerance: Patient tolerated the procedure well with no immediate complications.     Critical Care performed: yes  ____________________________________________   INITIAL IMPRESSION / ASSESSMENT AND PLAN / ED COURSE  Pertinent labs & imaging results that were available during my care of the patient were reviewed by me and considered in my medical decision making (see chart for details).   As part of my medical decision making, I reviewed the following data within the [facility] History obtained from family if available, nursing notes, old chart and ekg, as well as notes from prior ED visits.  The patient comes to the emergency department critically ill-appearing in severe pain.  On chart review she has a longstanding history of chronic pancreatitis secondary to hypertriglyceridemia and I am concerned she is having another episode now.  EMS and nursing were unable to establish IV access so for pain control and given her 2 mg of Dilaudid intramuscularly and 5 mg of haloperidol intramuscularly for pain and nausea.  This seemed to help somewhat although she continues to cry  out intermittently in pain.  I am actually quite familiar with the patient as about 3 months ago I treated her for a drug overdose and pancreatitis.  I am concerned that the patient has used her home medications to help stave off her legitimate pancreatitis pain and her pain is extremely difficult to control as her medications are quite sedating and she is intermittently falling asleep and then waking up screaming in pain.  I was able to establish IV access and have begun a low-dose insulin infusion along with D5.  She will require inpatient admission however unclear to what level of care.  Lab work is pending.  I came to reevaluate the patient and she was clearly  not protecting her airway with sonorous respirations hypoxic to about 90% on room air and gurgling respirations.  Her blood pressure is also low around 80/40 so I have ordered a Levophed infusion.  Her lab work is back showing acute kidney injury and I am concerned that this acute kidney injury has caused essentially carvedilol toxicity.  Decision was made to intubate for the patient's respiratory status and during intubation I saw copious secretions on her cords requiring aggressive suctioning and she was obviously aspirating prior to the intubation.  In the peri-intubation.  She remained bradycardic and hypotensive although responded nicely to levo fed.  At this point I have her on levo fed, fentanyl, dexmedetomidine, insulin, and D5 infusions.  I discussed with the hospitalist who has graciously agreed to admit the patient to his service.  The patient is admitted in critical condition.      ____________________________________________   FINAL CLINICAL IMPRESSION(S) / ED DIAGNOSES  Final diagnoses:  Other acute pancreatitis without infection or necrosis  Therapeutic misadventure  Acute kidney injury ([place])  Dehydration      NEW MEDICATIONS STARTED DURING THIS VISIT:  New Prescriptions   No medications on file     Note:  This  document was prepared using Dragon voice recognition software and may include unintentional dictation errors.    Darel Hong, MD 02/09/18 [phone]

## 2018-02-09 NOTE — ED Notes (Signed)
Report to ashley, rn

## 2018-02-09 NOTE — Progress Notes (Signed)
Patient admitted today from Emergency Department. When patient initially arrived was on fentanyl and precedex and was having sinus pauses. Sedation stopped and Dr. Jayme Cloud assessed patient and changed levophed to dopamine. During shift patient was able to have fentanyl stopped for several hours and did do spontaneous breathing trial but needed CT of her abdomen with oral contrast. So patient was not extubated and NG tube replaced with further advancement to allow for contrast. Patient did have small anxiety attack in CT due to claustrophobia which was relieved by prn versed. Several times throughout shift patient would wake up and clutch at her belly and complain of pain.

## 2018-02-09 NOTE — Progress Notes (Signed)
Pt transported to CT and back to CCU without complications. 

## 2018-02-09 NOTE — ED Notes (Signed)
Pt with iv attempt x2 without success. md to attempt US guided insertion.

## 2018-02-09 NOTE — ED Notes (Signed)
Pt assisted onto bedpan for diarrhea and gas.

## 2018-02-09 NOTE — ED Triage Notes (Signed)
Pt arrives via ems complains of "whole stomach" pain with diarrhea and vomiting x3 that began 60 min pta. md at bedside. Pt states pain extends to right flank.

## 2018-02-09 NOTE — Progress Notes (Signed)
[employer] [facility] [facility] [place] [facility]   PATIENT NAME: Shirelle Gilligan    MR#:  [id number]  DATE OF BIRTH:  20-Jul-1975  SUBJECTIVE:  CHIEF COMPLAINT: Patient's initial complaint was abdominal pain.  While in the emergency room patient noted to have become agitated and delirious with copious nasopharyngeal secretions.  Had to be intubated for airway protection.  At the time of my evaluation patient sedated on the vent. No new complaints reported  REVIEW OF SYSTEMS:  Review of Systems  Unable to perform ROS: Medical condition  Patient sedated on the vent  DRUG ALLERGIES:   Allergies  Allergen Reactions  . Precedex [Dexmedetomidine Hcl In Nacl] Other (See Comments)    Significant Bradycardia  . Cefpodoxime     Tolerates augmentin and Keflex   . Clarithromycin   . Doxycycline   . Ferrous Gluconate   . Lactose Intolerance (Gi)   . Levofloxacin   . Rizatriptan   . Sulfa Antibiotics   . Topiramate   . Venlafaxine   . Zofran [Ondansetron Hcl]    VITALS:  Blood pressure 101/68, pulse 75, temperature 99.2 F (37.3 C), temperature source Oral, resp. rate 16, height 5\' 7"  (1.702 m), weight 100.8 kg, SpO2 97 %. PHYSICAL EXAMINATION:   Physical Exam  Constitutional:  Sedated on the vent  HENT:  Head: Normocephalic and atraumatic.  Eyes: Pupils are equal, round, and reactive to light. Right eye exhibits no discharge.  Neck: Normal range of motion. No thyromegaly present.  Cardiovascular: Normal rate and regular rhythm.  Respiratory: Effort normal and breath sounds normal. No respiratory distress.  Sedated on the vent  GI: Soft. Bowel sounds are normal. She exhibits no distension.  Musculoskeletal:        General: No edema.  Neurological:  Patient sedated on the vent  Skin: Skin is warm and dry.   LABORATORY PANEL:  Female CBC Recent Labs  Lab 02/09/18 0536  WBC 17.1*  HGB 12.0  HCT 39.4  PLT 346    ------------------------------------------------------------------------------------------------------------------ Chemistries  Recent Labs  Lab 02/09/18 0536 02/09/18 1437  NA 138 138  K 4.7 4.7  CL 110 109  CO2 18* 22  GLUCOSE 155* 116*  BUN 28* 27*  CREATININE 2.05* 1.70*  CALCIUM 8.8* 8.0*  AST 20  --   ALT 14  --   ALKPHOS 50  --   BILITOT 0.3  --    RADIOLOGY:  Dg Chest 1 View  Result Date: 02/09/2018 CLINICAL DATA:  Intubation EXAM: CHEST  1 VIEW COMPARISON:  11/29/2017 FINDINGS: Endotracheal tube tip is 2.5 cm above the carina. Nasogastric tube tip is at the gastroesophageal junction. There is mild bibasilar atelectasis. Upper lungs are clear. No effusions. IMPRESSION: Endotracheal tube well positioned. Nasogastric tube tip only at the gastroesophageal junction. Mild basilar atelectasis. Electronically Signed   By: Paulina Fusi M.D.   On: 02/09/2018 07:18   US Renal  Result Date: 02/09/2018 CLINICAL DATA:  Acute kidney injury EXAM: RENAL / URINARY TRACT ULTRASOUND COMPLETE COMPARISON:  None. FINDINGS: Right Kidney: Renal measurements: 10.5 x 4.8 x 4.8 cm = volume: 126 mL. Increased renal cortical echogenicity. No mass or hydronephrosis visualized. Left Kidney: Renal measurements: 10.8 x 0.9 x 5.4 cm = volume: 178 mL. Increased renal cortical echogenicity. No mass or hydronephrosis visualized. Bladder: Appears normal for degree of bladder distention. IMPRESSION: 1. Mild increased renal cortical echogenicity bilaterally as can be seen with medical renal disease versus acute kidney injury. 2. No obstructive uropathy.  Electronically Signed   By: Elige Ko   On: 02/09/2018 11:26   Dg Abd Portable 1v  Result Date: 02/09/2018 CLINICAL DATA:  Nasogastric tube placement EXAM: PORTABLE ABDOMEN - 1 VIEW COMPARISON:  None. FINDINGS: Nasogastric tube with the tip projecting over the stomach, but the proximal port is above the esophagogastric junction. Recommend advancing the nasogastric  tube 7.5 cm. There is no bowel dilatation to suggest obstruction. There is no evidence of pneumoperitoneum, portal venous gas or pneumatosis. There are no pathologic calcifications along the expected course of the ureters. Endotracheal tube with the tip 2.5 cm above the carina. Bibasilar airspace disease likely reflecting atelectasis. IMPRESSION: 1. Nasogastric tube with the tip projecting over the stomach, but the proximal port is above the esophagogastric junction. Recommend advancing the nasogastric tube 7.5 cm. Electronically Signed   By: Elige Ko   On: 02/09/2018 12:37   ASSESSMENT AND PLAN:   15F w/ PMHx obesity, T2NIDDM, HTN, HLD, IBS, fibromyalgia, depression, p/w AP/N/V/D, overdose Who initially presented to the emergency room with abdominal pains.  Subsequently documented to have become more confused and delirious.  Was having copious nasopharyngeal secretions from the upper airway and had to be intubated for airway protection.  Subsequently admitted to the intensive care unit.   1.  Acute metabolic encephalopathy Multifactorial.  Patient reported to have become agitated and delirious in the emergency room.  Noted to have increased secretions with evidence of hypoxic respiratory failure.  Was intubated for airway protection. Being managed by intensivist.  Plans to wean of the vent between today and tomorrow as tolerated.  2.  Acute kidney injury Renal function gradually improving with IV fluid hydration. Follow-up on renal function in a.m. renal ultrasound done with mild increased renal cortical echogenicity bilaterally consistent with medical renal disease versus acute kidney injury.  No obstructive uropathy.  Avoid nephrotoxic agents.  3.  Diabetes mellitus type 2 Sliding scale insulin coverage for now  4.  Hypocalcemia  5.  Acute pancreatitis  Felt to be secondary to hypertriglyceridemia with triglyceride level of 595 Patient currently n.p.o.  IV fluid hydration.  Follow-up on  lipase level in a.m. To consider right upper quadrant ultrasound to evaluate gallbladder  6.  Leukocytosis.  Felt to be reactive in nature.  Concern for aspiration.  Chest x-ray negative for consolidation.  Started on Unasyn already.  Monitor clinically  DVT PPx: Lovenox.  CODE STATUS: Full Code  TOTAL TIME TAKING CARE OF THIS PATIENT: 38 minutes.   More than 50% of the time was spent in counseling/coordination of care: YES  POSSIBLE D/C IN 3 DAYS, DEPENDING ON CLINICAL CONDITION.   Birdie Beveridge M.D on 02/09/2018 at 4:34 PM  Between 7am to 6pm - Pager - [phone]  After 6pm go to [email or web address] - Social research officer, government  [facility]  Office  [phone]  CC: Primary care physician; Thomes Dinning, MD  Note: This dictation was prepared with Dragon dictation along with smaller phrase technology. Any transcriptional errors that result from this process are unintentional.

## 2018-02-09 NOTE — ED Notes (Signed)
Attempted IV x1, unsuccessful.  

## 2018-02-09 NOTE — Consult Note (Signed)
Reason for Consult: Assistance with ventilator management Referring Physician: [place], Terri Berry is an 43 y.o. [name]  History obtained from available records as patient cannot provide  HPI: The patient is a 43 year old with a known history of pancreatitis, elevated triglycerides and status post cholecystectomy who presented to the emergency room today with recurrent abdominal pain.  She apparently had some issues with mental status change while in the ED where she could no longer protect her airway.  She was intubated for airway protection.  She was brought immediately to the intensive care unit intubated and mechanically ventilated.  She was on a fentanyl drip and Precedex infusion.  She was noted to be profoundly bradycardic on Precedex and actually had 2 transient episodes of asystole.  These resolved spontaneously.  Bradycardia improved when Precedex was discontinued.  In the ICU she has been noted to occasionally awaken and tries to reach for her upper abdomen and states she is in pain.  CT abdomen and pelvis was not performed so this will be done.  Because of the issues with hemodynamic instability and intractable pain she will remain intubated until these are controlled.  Past Medical History:  Diagnosis Date  . Hypertension   . IBS (irritable bowel syndrome)     History reviewed. No pertinent surgical history.  History reviewed. No pertinent family history.  Social History:  reports that she has been smoking. She has never used smokeless tobacco. She reports previous alcohol use. No history on file for drug.  Allergies:  Allergies  Allergen Reactions  . Precedex [Dexmedetomidine Hcl In Nacl] Other (See Comments)    Significant Bradycardia  . Cefpodoxime     Tolerates augmentin and Keflex   . Clarithromycin   . Doxycycline   . Ferrous Gluconate   . Lactose Intolerance (Gi)   . Levofloxacin   . Rizatriptan   . Sulfa Antibiotics   . Topiramate   . Venlafaxine   .  Zofran [Ondansetron Hcl]     Medications: I have reviewed the patient's current medications.  Results for orders placed or performed during the hospital encounter of 02/09/18 (from the past 48 hour(s))  Comprehensive metabolic panel     Status: Abnormal   Collection Time: 02/09/18  5:36 AM  Result Value Ref Range   Sodium 138 135 - 145 mmol/L   Potassium 4.7 3.5 - 5.1 mmol/L   Chloride 110 98 - 111 mmol/L   CO2 18 (L) 22 - 32 mmol/L   Glucose, Bld 155 (H) 70 - 99 mg/dL   BUN 28 (H) 6 - 20 mg/dL   Creatinine, Ser 6.33 (H) 0.44 - 1.00 mg/dL   Calcium 8.8 (L) 8.9 - 10.3 mg/dL   Total Protein 8.1 6.5 - 8.1 g/dL   Albumin 4.5 3.5 - 5.0 g/dL   AST 20 15 - 41 U/L   ALT 14 0 - 44 U/L   Alkaline Phosphatase 50 38 - 126 U/L   Total Bilirubin 0.3 0.3 - 1.2 mg/dL   GFR calc non Af Amer 29 (L) >60 mL/min   GFR calc Af Amer 33 (L) >60 mL/min   Anion gap 10 5 - 15    Comment: Performed at [facility], [address] [place], [place] [address]  Lipase, blood     Status: Abnormal   Collection Time: 02/09/18  5:36 AM  Result Value Ref Range   Lipase 4,582 (H) 11 - 51 U/L    Comment: RESULT CONFIRMED BY MANUAL DILUTION...[facility] Performed at  [facility], [address] [place], [place] [address]   CBC with Differential     Status: Abnormal   Collection Time: 02/09/18  5:36 AM  Result Value Ref Range   WBC 17.1 (H) 4.0 - 10.5 K/uL   RBC 4.48 3.87 - 5.11 MIL/uL   Hemoglobin 12.0 12.0 - 15.0 g/dL   HCT 98.1 19.1 - 47.8 %   MCV 87.9 80.0 - 100.0 fL   MCH 26.8 26.0 - 34.0 pg   MCHC 30.5 30.0 - 36.0 g/dL   RDW 29.5 (H) 62.1 - 30.8 %   Platelets 346 150 - 400 K/uL   nRBC 0.0 0.0 - 0.2 %   Neutrophils Relative % 68 %   Neutro Abs 11.6 (H) 1.7 - 7.7 K/uL   Lymphocytes Relative 18 %   Lymphs Abs 3.1 0.7 - 4.0 K/uL   Monocytes Relative 9 %   Monocytes Absolute 1.6 (H) 0.1 - 1.0 K/uL   Eosinophils Relative 3 %   Eosinophils Absolute 0.4 0.0 - 0.5 K/uL   Basophils Relative  1 %   Basophils Absolute 0.2 (H) 0.0 - 0.1 K/uL   Immature Granulocytes 1 %   Abs Immature Granulocytes 0.14 (H) 0.00 - 0.07 K/uL    Comment: Performed at [facility], [address] [place], [place] [address]  Beta-hydroxybutyric acid     Status: None   Collection Time: 02/09/18  5:36 AM  Result Value Ref Range   Beta-Hydroxybutyric Acid 0.07 0.05 - 0.27 mmol/L    Comment: Performed at [facility], [address] [place], [place] [address]  Triglycerides     Status: Abnormal   Collection Time: 02/09/18  5:36 AM  Result Value Ref Range   Triglycerides 595 (H) <150 mg/dL    Comment: Performed at [facility], [address] [place], [place] [address]  Glucose, capillary     Status: Abnormal   Collection Time: 02/09/18  5:36 AM  Result Value Ref Range   Glucose-Capillary 153 (H) 70 - 99 mg/dL  Acetaminophen level     Status: Abnormal   Collection Time: 02/09/18  6:06 AM  Result Value Ref Range   Acetaminophen (Tylenol), Serum <10 (L) 10 - 30 ug/mL    Comment: (NOTE) Therapeutic concentrations vary significantly. A range of 10-30 ug/mL  may be an effective concentration for many patients. However, some  are best treated at concentrations outside of this range. Acetaminophen concentrations >150 ug/mL at 4 hours after ingestion  and >50 ug/mL at 12 hours after ingestion are often associated with  toxic reactions. Performed at [facility], [address] [place], [place] [address]   Ethanol     Status: None   Collection Time: 02/09/18  6:06 AM  Result Value Ref Range   Alcohol, Ethyl (B) <10 <10 mg/dL    Comment: (NOTE) Lowest detectable limit for serum alcohol is 10 mg/dL. For medical purposes only. Performed at [facility], [address] [place], [place] [address]   Salicylate level     Status: None   Collection Time: 02/09/18  6:06 AM  Result Value Ref Range   Salicylate Lvl <7.0 2.8 - 30.0 mg/dL    Comment:  Performed at [facility], [address] [place], [place] [address]  Glucose, capillary     Status: Abnormal   Collection Time: 02/09/18  7:08 AM  Result Value Ref Range   Glucose-Capillary 116 (H) 70 - 99 mg/dL  Urinalysis, Complete w  Microscopic     Status: Abnormal   Collection Time: 02/09/18  7:32 AM  Result Value Ref Range   Color, Urine YELLOW (A) YELLOW   APPearance CLEAR (A) CLEAR   Specific Gravity, Urine 1.014 1.005 - 1.030   pH 5.0 5.0 - 8.0   Glucose, UA NEGATIVE NEGATIVE mg/dL   Hgb urine dipstick NEGATIVE NEGATIVE   Bilirubin Urine NEGATIVE NEGATIVE   Ketones, ur NEGATIVE NEGATIVE mg/dL   Protein, ur 30 (A) NEGATIVE mg/dL   Nitrite NEGATIVE NEGATIVE   Leukocytes, UA NEGATIVE NEGATIVE   RBC / HPF 0-5 0 - 5 RBC/hpf   WBC, UA 0-5 0 - 5 WBC/hpf   Bacteria, UA RARE (A) NONE SEEN   Squamous Epithelial / LPF 0-5 0 - 5   Mucus PRESENT    Hyaline Casts, UA PRESENT    Amorphous Crystal PRESENT     Comment: Performed at [facility], [address], [place], [place] [address]  Urine Drug Screen, Qualitative     Status: Abnormal   Collection Time: 02/09/18  7:32 AM  Result Value Ref Range   Tricyclic, Ur Screen POSITIVE (A) NONE DETECTED   Amphetamines, Ur Screen NONE DETECTED NONE DETECTED   MDMA (Ecstasy)Ur Screen NONE DETECTED NONE DETECTED   Cocaine Metabolite,Ur [place] NONE DETECTED NONE DETECTED   Opiate, Ur Screen NONE DETECTED NONE DETECTED   Phencyclidine (PCP) Ur S NONE DETECTED NONE DETECTED   Cannabinoid 50 Ng, Ur [place] NONE DETECTED NONE DETECTED   Barbiturates, Ur Screen NONE DETECTED NONE DETECTED   Benzodiazepine, Ur Scrn NONE DETECTED NONE DETECTED   Methadone Scn, Ur NONE DETECTED NONE DETECTED    Comment: (NOTE) Tricyclics + metabolites, urine    Cutoff 1000 ng/mL Amphetamines + metabolites, urine  Cutoff 1000 ng/mL MDMA (Ecstasy), urine              Cutoff 500 ng/mL Cocaine Metabolite, urine          Cutoff 300 ng/mL Opiate +  metabolites, urine        Cutoff 300 ng/mL Phencyclidine (PCP), urine         Cutoff 25 ng/mL Cannabinoid, urine                 Cutoff 50 ng/mL Barbiturates + metabolites, urine  Cutoff 200 ng/mL Benzodiazepine, urine              Cutoff 200 ng/mL Methadone, urine                   Cutoff 300 ng/mL The urine drug screen provides only a preliminary, unconfirmed analytical test result and should not be used for non-medical purposes. Clinical consideration and professional judgment should be applied to any positive drug screen result due to possible interfering substances. A more specific alternate chemical method must be used in order to obtain a confirmed analytical result. Gas chromatography / mass spectrometry (GC/MS) is the preferred confirmat ory method. Performed at [facility], [address] [place], [place] [address]   Creatinine, urine, random     Status: None   Collection Time: 02/09/18  7:32 AM  Result Value Ref Range   Creatinine, Urine 118 mg/dL    Comment: Performed at [facility], [address] [place], [place] [address]  Protein, urine, random     Status: None   Collection Time: 02/09/18  7:32 AM  Result Value Ref Range   Total Protein, Urine 53 mg/dL    Comment: NO  NORMAL RANGE ESTABLISHED FOR THIS TEST Performed at [facility], [address] [place], [place] [address]   Na and K (sodium & potassium), rand urine     Status: None   Collection Time: 02/09/18  7:32 AM  Result Value Ref Range   Sodium, Ur 93 mmol/L   Potassium Urine 42 mmol/L    Comment: Performed at [facility], [address] [place], [place] [address]  Blood gas, arterial     Status: Abnormal   Collection Time: 02/09/18  7:32 AM  Result Value Ref Range   FIO2 0.40    Delivery systems VENTILATOR    Mode ASSIST CONTROL    VT 500 mL   LHR 20 resp/min   Peep/cpap 5.0 cm H20   pH, Arterial 7.26 (L) 7.350 - 7.450   pCO2 arterial 40 32.0 - 48.0  mmHg   pO2, Arterial 82 (L) 83.0 - 108.0 mmHg   Bicarbonate 17.9 (L) 20.0 - 28.0 mmol/L   Acid-base deficit 8.7 (H) 0.0 - 2.0 mmol/L   O2 Saturation 94.1 %   Patient temperature 37.0    Collection site RIGHT RADIAL    Sample type ARTERIAL DRAW    Allens test (pass/fail) POSITIVE (A) PASS    Comment: Performed at [facility], [address] [place], [place] [address]  Glucose, capillary     Status: Abnormal   Collection Time: 02/09/18  8:07 AM  Result Value Ref Range   Glucose-Capillary 116 (H) 70 - 99 mg/dL  Glucose, capillary     Status: None   Collection Time: 02/09/18  9:04 AM  Result Value Ref Range   Glucose-Capillary 93 70 - 99 mg/dL  MRSA PCR Screening     Status: None   Collection Time: 02/09/18  9:26 AM  Result Value Ref Range   MRSA by PCR NEGATIVE NEGATIVE    Comment:        The GeneXpert MRSA Assay (FDA approved for NASAL specimens only), is one component of a comprehensive MRSA colonization surveillance program. It is not intended to diagnose MRSA infection nor to guide or monitor treatment for MRSA infections. Performed at [facility], [address] [place], [place] [address]   Glucose, capillary     Status: None   Collection Time: 02/09/18 10:04 AM  Result Value Ref Range   Glucose-Capillary 78 70 - 99 mg/dL  Glucose, capillary     Status: Abnormal   Collection Time: 02/09/18 11:04 AM  Result Value Ref Range   Glucose-Capillary 130 (H) 70 - 99 mg/dL  Glucose, capillary     Status: Abnormal   Collection Time: 02/09/18 11:51 AM  Result Value Ref Range   Glucose-Capillary 115 (H) 70 - 99 mg/dL  Renal function panel     Status: Abnormal   Collection Time: 02/09/18  2:37 PM  Result Value Ref Range   Sodium 138 135 - 145 mmol/L   Potassium 4.7 3.5 - 5.1 mmol/L   Chloride 109 98 - 111 mmol/L   CO2 22 22 - 32 mmol/L   Glucose, Bld 116 (H) 70 - 99 mg/dL   BUN 27 (H) 6 - 20 mg/dL   Creatinine, Ser 2.131.70 (H) 0.44 - 1.00 mg/dL    Calcium 8.0 (L) 8.9 - 10.3 mg/dL   Phosphorus 3.4 2.5 - 4.6 mg/dL   Albumin 3.9 3.5 - 5.0 g/dL   GFR calc non Af Amer 36 (L) >60 mL/min   GFR calc Af Amer 42 (L) >60  mL/min   Anion gap 7 5 - 15    Comment: Performed at [facility], [address] [place], [place] [address]  Glucose, capillary     Status: None   Collection Time: 02/09/18  4:00 PM  Result Value Ref Range   Glucose-Capillary 95 70 - 99 mg/dL  Glucose, capillary     Status: Abnormal   Collection Time: 02/09/18  7:41 PM  Result Value Ref Range   Glucose-Capillary 109 (H) 70 - 99 mg/dL   Comment 1 Notify RN    Comment 2 Document in Chart     Dg Chest 1 View  Result Date: 02/09/2018 CLINICAL DATA:  Intubation EXAM: CHEST  1 VIEW COMPARISON:  11/29/2017 FINDINGS: Endotracheal tube tip is 2.5 cm above the carina. Nasogastric tube tip is at the gastroesophageal junction. There is mild bibasilar atelectasis. Upper lungs are clear. No effusions. IMPRESSION: Endotracheal tube well positioned. Nasogastric tube tip only at the gastroesophageal junction. Mild basilar atelectasis. Electronically Signed   By: Paulina Fusi M.D.   On: 02/09/2018 07:18   Ct Abdomen Pelvis W Contrast  Result Date: 02/09/2018 CLINICAL DATA:  Abdominal pain EXAM: CT ABDOMEN AND PELVIS WITH CONTRAST TECHNIQUE: Multidetector CT imaging of the abdomen and pelvis was performed using the standard protocol following bolus administration of intravenous contrast. CONTRAST:  75mL OMNIPAQUE IOHEXOL 300 MG/ML  SOLN COMPARISON:  CT abdomen pelvis-12/02/2017; 11/29/2017; abdominal chest radiograph - earlier same day FINDINGS: Lower chest: Limited visualization of the lower thorax demonstrates bibasilar consolidative opacity with associated air bronchograms. No pleural effusion. Cardiomegaly.  No pericardial effusion. Hepatobiliary: Normal hepatic contour. No discrete hepatic lesions. Post cholecystectomy. Very mild centralized intrahepatic biliary duct dilatation,  likely the sequela of prior cholecystectomy and associated biliary reservoir phenomenon. No ascites. Pancreas: There is diffuse ill-defined stranding centered about the pancreatic bed with moderate amount fluid extending along the left pericolic gutter. No definitive definable/drainable fluid collection/pseudocyst. There is homogeneous enhancement of the pancreatic parenchyma. No definitive pancreatic mass or pancreatic ductal dilatation on this non pancreatic protocol CT scan. The adjacent vasculature appears patent, in particular, the splenic artery and vein appear patent without discrete area of vessel irregularity on this non CTA examination. Spleen: Normal appearance of the spleen. Punctate splenule noted about the splenic hilum. Adrenals/Urinary Tract: There is symmetric enhancement and excretion of the bilateral kidneys. No definite renal stones on this postcontrast examination. No discrete renal lesions. No urinary obstruction or perinephric stranding. Normal appearance of the bilateral adrenal glands. The urinary bladder is decompressed with a Foley catheter. Stomach/Bowel: No evidence of enteric obstruction. Normal appearance of the terminal ileum and retrocecal appendix. Enteric tube tip terminates within the duodenal bulb. Enteric contrast extends to the level of the mid/distal small bowel. No pneumoperitoneum, pneumatosis or portal venous gas. Vascular/Lymphatic: Normal caliber of the abdominal aorta. The major branch vessels appear patent on this non CTA examination. As above, the peripancreatic vessels appear patent with special attention paid to the splenic artery and vein. Scattered porta hepatis and retroperitoneal lymph nodes are numerous though individually not enlarged by size criteria, presumably reactive in etiology. No bulky retroperitoneal, mesenteric, pelvic or inguinal lymphadenopathy. Reproductive: Post hysterectomy. No discrete adnexal lesion. Small amount of fluid in the pelvic  cul-de-sac. Other: Minimal amount of body wall edema about the midline of the low back. Musculoskeletal: No definite acute or aggressive osseous abnormalities. 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  IMPRESSION: 1. Findings compatible with recurrent acute extensive though uncomplicated pancreatitis, worse than the 11/29/2017 examination. Moderate amount of fluid about the pancreatic bed and tracking along the left pericolic gutter without definitive definable/drainable fluid collection/pseudocyst. No definitive evidence of pancreatic necrosis, pancreatic mass or definitive pancreatic ductal dilatation on this non pancreatic protocol CT scan. Post cholecystectomy without definitive radiopaque choledocholithiasis. 2. Consolidative opacities with associated air bronchograms within the imaged bilateral lung bases, potentially atelectasis though infection and/or aspiration could have a similar appearance. Clinical correlation is advised. 3. Enteric tube terminates within the duodenum bulb. Electronically Signed   By: Simonne Come M.D.   On: 02/09/2018 18:53   US Renal  Result Date: 02/09/2018 CLINICAL DATA:  Acute kidney injury EXAM: RENAL / URINARY TRACT ULTRASOUND COMPLETE COMPARISON:  None. FINDINGS: Right Kidney: Renal measurements: 10.5 x 4.8 x 4.8 cm = volume: 126 mL. Increased renal cortical echogenicity. No mass or hydronephrosis visualized. Left Kidney: Renal measurements: 10.8 x 0.9 x 5.4 cm = volume: 178 mL. Increased renal cortical echogenicity. No mass or hydronephrosis visualized. Bladder: Appears normal for degree of bladder distention. IMPRESSION: 1. Mild increased renal cortical echogenicity bilaterally as can be seen with medical renal disease versus acute kidney injury. 2. No obstructive uropathy. Electronically Signed   By: Elige Ko   On: 02/09/2018 11:26   Dg Abd Portable 1v  Result Date:  02/09/2018 CLINICAL DATA:  Check gastric catheter placement EXAM: PORTABLE ABDOMEN - 1 VIEW COMPARISON:  Film from earlier in the same day. FINDINGS: Gastric catheter is been advanced and now lies in the region of the pyloric channel and possibly into the first portion of the duodenum. IMPRESSION: Gastric catheter as described Electronically Signed   By: Alcide Clever M.D.   On: 02/09/2018 16:34   Dg Abd Portable 1v  Result Date: 02/09/2018 CLINICAL DATA:  Nasogastric tube placement EXAM: PORTABLE ABDOMEN - 1 VIEW COMPARISON:  None. FINDINGS: Nasogastric tube with the tip projecting over the stomach, but the proximal port is above the esophagogastric junction. Recommend advancing the nasogastric tube 7.5 cm. There is no bowel dilatation to suggest obstruction. There is no evidence of pneumoperitoneum, portal venous gas or pneumatosis. There are no pathologic calcifications along the expected course of the ureters. Endotracheal tube with the tip 2.5 cm above the carina. Bibasilar airspace disease likely reflecting atelectasis. IMPRESSION: 1. Nasogastric tube with the tip projecting over the stomach, but the proximal port is above the esophagogastric junction. Recommend advancing the nasogastric tube 7.5 cm. Electronically Signed   By: Elige Ko   On: 02/09/2018 12:37    Review of Systems  Unable to perform ROS: Intubated   Blood pressure 110/64, pulse 82, temperature 100 F (37.8 C), temperature source Oral, resp. rate 15, height 5\' 7"  (1.702 m), weight 100.8 kg, SpO2 98 %. Physical Exam  Nursing note and vitals reviewed. Constitutional: She appears well-developed. She is sedated and intubated.  Obese  HENT:  Head: Normocephalic and atraumatic.  Mouth/Throat: No oropharyngeal exudate.  Orotracheally intubated  Eyes: Pupils are equal, round, and reactive to light. Conjunctivae are normal. No scleral icterus.  Neck: Neck supple. No JVD present. No tracheal deviation present. No thyromegaly present.   Cardiovascular: Regular rhythm, S1 normal, S2 normal and intact distal pulses. Bradycardia present. Exam reveals no gallop.  No murmur heard. Respiratory: Effort normal and breath sounds normal. She is intubated.  Synchronous with the ventilator  GI: She exhibits no distension and no mass. There is abdominal tenderness. There  is no rebound.  Obese  Musculoskeletal: Normal range of motion.        General: No edema.  Lymphadenopathy:    She has no cervical adenopathy.  Neurological:  Sedated on the ventilator.  Occasionally awakens.  Moves all 4 spontaneously.  Skin: Skin is warm and dry. No rash noted.  Psychiatric:  Cannot assess due to intubated status.    Assessment/Plan:  1.  Acute respiratory failure: This appears to have been secondary to loss of airway protection due to her initial agitation and then lethargy.  Uncertain what triggered these events however it appears that the patient does use significant amount of benzodiazepines and pain medications at home and this may have been medication effect.  We will continue mechanical ventilator support at present as the patient will need abdominal and pelvis CT and better control of her pain.  Hopefully can extubate within 24 to 48 hours.  She does not appear to have pneumonic process or any other injury to the lung at present.  2.  Acute pancreatitis: The patient has elevated lipase at over 4000.  Triglycerides are in the 500 range which is actually reduced from her prior level in October.  Her abdomen and pelvis CT show significant inflammation of the pancreas compared to her October film.  These films were independently reviewed.  At present we will provide supportive care.   3.  Bradycardia: This appears to have been related to Precedex infusion, this has been discontinued.  She remains relatively bradycardic so we will hold Coreg.  In addition noting that her lipase is exceedingly high and she has significant pancreatic inflammation it  is common to see bradycardia with severe pancreatic inflammation due to release of bradykinins.  Continue supportive care.  Continue cardiac monitoring.  Avoid AV nodal acting drugs.  4.  Elevated triglycerides: Will not use propofol because of bradycardia and because of elevated triglycerides.  Continue lipid-lowering medications.  4.  Acute kidney injury: This appears to be due to mild volume depletion, will replete volume.  5.  Hypocalcemia, mild: Check magnesium level and replete if needed.  6.  Obesity: This issue adds complexity to her management.  The patient will be placed on DVT prophylaxis with Lovenox.  As noted she will be placed on ventilator management module VAP prevention module.  Assess daily for potential extubation.  Of note the patient's father presented at the bedside and he was updated on all the above.   Critical care time: 60 minutes  C. Foye Spurling [facility] PCCM  02/09/2018, 10:23 PM

## 2018-02-09 NOTE — ED Notes (Signed)
Intensivist to bedside at this time. 

## 2018-02-10 ENCOUNTER — Inpatient Hospital Stay: Payer: Medicaid Other

## 2018-02-10 ENCOUNTER — Inpatient Hospital Stay: Payer: Self-pay

## 2018-02-10 LAB — GLUCOSE, CAPILLARY
GLUCOSE-CAPILLARY: 79 mg/dL (ref 70–99)
Glucose-Capillary: 100 mg/dL — ABNORMAL HIGH (ref 70–99)
Glucose-Capillary: 102 mg/dL — ABNORMAL HIGH (ref 70–99)
Glucose-Capillary: 105 mg/dL — ABNORMAL HIGH (ref 70–99)
Glucose-Capillary: 79 mg/dL (ref 70–99)
Glucose-Capillary: 97 mg/dL (ref 70–99)

## 2018-02-10 LAB — BLOOD GAS, ARTERIAL
Acid-base deficit: 1.8 mmol/L (ref 0.0–2.0)
Bicarbonate: 23.1 mmol/L (ref 20.0–28.0)
FIO2: 0.4
O2 Saturation: 95 %
PEEP: 5 cmH2O
Patient temperature: 37
RATE: 20 resp/min
VT: 500 mL
pCO2 arterial: 39 mmHg (ref 32.0–48.0)
pH, Arterial: 7.38 (ref 7.350–7.450)
pO2, Arterial: 77 mmHg — ABNORMAL LOW (ref 83.0–108.0)

## 2018-02-10 LAB — CBC WITH DIFFERENTIAL/PLATELET
Abs Immature Granulocytes: 0.19 10*3/uL — ABNORMAL HIGH (ref 0.00–0.07)
Basophils Absolute: 0.1 10*3/uL (ref 0.0–0.1)
Basophils Relative: 0 %
EOS PCT: 1 %
Eosinophils Absolute: 0.1 10*3/uL (ref 0.0–0.5)
HCT: 31.4 % — ABNORMAL LOW (ref 36.0–46.0)
Hemoglobin: 9.5 g/dL — ABNORMAL LOW (ref 12.0–15.0)
Immature Granulocytes: 1 %
Lymphocytes Relative: 11 %
Lymphs Abs: 2.1 10*3/uL (ref 0.7–4.0)
MCH: 26.5 pg (ref 26.0–34.0)
MCHC: 30.3 g/dL (ref 30.0–36.0)
MCV: 87.7 fL (ref 80.0–100.0)
MONO ABS: 2.1 10*3/uL — AB (ref 0.1–1.0)
Monocytes Relative: 11 %
Neutro Abs: 15 10*3/uL — ABNORMAL HIGH (ref 1.7–7.7)
Neutrophils Relative %: 76 %
Platelets: 247 10*3/uL (ref 150–400)
RBC: 3.58 MIL/uL — ABNORMAL LOW (ref 3.87–5.11)
RDW: 17.9 % — ABNORMAL HIGH (ref 11.5–15.5)
WBC: 19.6 10*3/uL — AB (ref 4.0–10.5)
nRBC: 0 % (ref 0.0–0.2)

## 2018-02-10 LAB — BASIC METABOLIC PANEL
Anion gap: 7 (ref 5–15)
Anion gap: 9 (ref 5–15)
BUN: 21 mg/dL — ABNORMAL HIGH (ref 6–20)
BUN: 21 mg/dL — ABNORMAL HIGH (ref 6–20)
CHLORIDE: 106 mmol/L (ref 98–111)
CO2: 22 mmol/L (ref 22–32)
CO2: 23 mmol/L (ref 22–32)
Calcium: 8.1 mg/dL — ABNORMAL LOW (ref 8.9–10.3)
Calcium: 8.2 mg/dL — ABNORMAL LOW (ref 8.9–10.3)
Chloride: 103 mmol/L (ref 98–111)
Creatinine, Ser: 1.65 mg/dL — ABNORMAL HIGH (ref 0.44–1.00)
Creatinine, Ser: 1.67 mg/dL — ABNORMAL HIGH (ref 0.44–1.00)
GFR calc Af Amer: 43 mL/min — ABNORMAL LOW (ref >60)
GFR calc non Af Amer: 37 mL/min — ABNORMAL LOW (ref >60)
GFR calc non Af Amer: 37 mL/min — ABNORMAL LOW (ref >60)
GFR, EST AFRICAN AMERICAN: 43 mL/min — AB (ref >60)
Glucose, Bld: 91 mg/dL (ref 70–99)
Glucose, Bld: 106 mg/dL — ABNORMAL HIGH (ref 70–99)
Potassium: 3.8 mmol/L (ref 3.5–5.1)
Potassium: 3.9 mmol/L (ref 3.5–5.1)
SODIUM: 135 mmol/L (ref 135–145)
Sodium: 135 mmol/L (ref 135–145)

## 2018-02-10 LAB — MAGNESIUM: Magnesium: 1.3 mg/dL — ABNORMAL LOW (ref 1.7–2.4)

## 2018-02-10 LAB — CBC
HCT: 34.5 % — ABNORMAL LOW (ref 36.0–46.0)
Hemoglobin: 10.5 g/dL — ABNORMAL LOW (ref 12.0–15.0)
MCH: 26.6 pg (ref 26.0–34.0)
MCHC: 30.4 g/dL (ref 30.0–36.0)
MCV: 87.3 fL (ref 80.0–100.0)
Platelets: 251 10*3/uL (ref 150–400)
RBC: 3.95 MIL/uL (ref 3.87–5.11)
RDW: 18 % — ABNORMAL HIGH (ref 11.5–15.5)
WBC: 13.7 10*3/uL — ABNORMAL HIGH (ref 4.0–10.5)
nRBC: 0 % (ref 0.0–0.2)

## 2018-02-10 LAB — UREA NITROGEN, URINE: Urea Nitrogen, Ur: 299 mg/dL

## 2018-02-10 LAB — PHOSPHORUS: Phosphorus: 2.5 mg/dL (ref 2.5–4.6)

## 2018-02-10 LAB — LIPASE, BLOOD: Lipase: 315 U/L — ABNORMAL HIGH (ref 11–51)

## 2018-02-10 LAB — TRIGLYCERIDES: Triglycerides: 397 mg/dL — ABNORMAL HIGH (ref <150)

## 2018-02-10 MED ORDER — MAGNESIUM SULFATE 4 GM/100ML IV SOLN
4.0000 g | Freq: Once | INTRAVENOUS | Status: AC
  Administered 2018-02-10: 4 g via INTRAVENOUS
  Filled 2018-02-10: qty 100

## 2018-02-10 MED ORDER — LACTATED RINGERS IV BOLUS
500.0000 mL | Freq: Once | INTRAVENOUS | Status: AC
  Administered 2018-02-10: 500 mL via INTRAVENOUS

## 2018-02-10 MED ORDER — TRAZODONE HCL 50 MG PO TABS
50.0000 mg | ORAL_TABLET | Freq: Every evening | ORAL | Status: DC | PRN
  Filled 2018-02-10: qty 1

## 2018-02-10 MED ORDER — METOCLOPRAMIDE HCL 5 MG/ML IJ SOLN
10.0000 mg | Freq: Four times a day (QID) | INTRAMUSCULAR | Status: DC
  Administered 2018-02-10 – 2018-02-15 (×19): 10 mg via INTRAVENOUS
  Filled 2018-02-10 (×19): qty 2

## 2018-02-10 MED ORDER — SODIUM CHLORIDE 0.9% FLUSH
10.0000 mL | INTRAVENOUS | Status: DC | PRN
  Administered 2018-02-13 (×5): 10 mL
  Filled 2018-02-10 (×5): qty 40

## 2018-02-10 MED ORDER — FENTANYL CITRATE (PF) 100 MCG/2ML IJ SOLN
12.5000 ug | INTRAMUSCULAR | Status: DC | PRN
  Administered 2018-02-10 – 2018-02-12 (×9): 12.5 ug via INTRAVENOUS
  Filled 2018-02-10 (×9): qty 2

## 2018-02-10 MED ORDER — PANCRELIPASE (LIP-PROT-AMYL) 12000-38000 UNITS PO CPEP: 12000.0000 [IU] | ORAL_CAPSULE | ORAL | Status: DC | PRN

## 2018-02-10 MED ORDER — SENNOSIDES-DOCUSATE SODIUM 8.6-50 MG PO TABS
2.0000 | ORAL_TABLET | Freq: Two times a day (BID) | ORAL | Status: DC
  Administered 2018-02-10 – 2018-02-12 (×4): 2 via ORAL
  Filled 2018-02-10 (×4): qty 2

## 2018-02-10 MED ORDER — METRONIDAZOLE IN NACL 5-0.79 MG/ML-% IV SOLN
500.0000 mg | Freq: Three times a day (TID) | INTRAVENOUS | Status: DC
  Administered 2018-02-10 – 2018-02-11 (×2): 500 mg via INTRAVENOUS
  Filled 2018-02-10 (×4): qty 100

## 2018-02-10 MED ORDER — PANTOPRAZOLE SODIUM 40 MG PO TBEC
40.0000 mg | DELAYED_RELEASE_TABLET | Freq: Every day | ORAL | Status: DC
  Administered 2018-02-10 – 2018-02-15 (×6): 40 mg via ORAL
  Filled 2018-02-10 (×6): qty 1

## 2018-02-10 MED ORDER — PANCRELIPASE (LIP-PROT-AMYL) 12000-38000 UNITS PO CPEP
24000.0000 [IU] | ORAL_CAPSULE | Freq: Three times a day (TID) | ORAL | Status: DC
  Administered 2018-02-10 – 2018-02-15 (×12): 24000 [IU] via ORAL
  Filled 2018-02-10 (×12): qty 2

## 2018-02-10 MED ORDER — ORAL CARE MOUTH RINSE: 15.0000 mL | Freq: Two times a day (BID) | OROMUCOSAL | Status: DC

## 2018-02-10 MED ORDER — INSULIN ASPART 100 UNIT/ML ~~LOC~~ SOLN: 0.0000 [IU] | Freq: Every day | SUBCUTANEOUS | Status: DC

## 2018-02-10 MED ORDER — INSULIN ASPART 100 UNIT/ML ~~LOC~~ SOLN
0.0000 [IU] | Freq: Three times a day (TID) | SUBCUTANEOUS | Status: DC
  Filled 2018-02-10: qty 1

## 2018-02-10 MED ORDER — SODIUM CHLORIDE 0.9% FLUSH
10.0000 mL | Freq: Two times a day (BID) | INTRAVENOUS | Status: DC
  Administered 2018-02-10: 10 mL
  Administered 2018-02-10: 20 mL
  Administered 2018-02-11 – 2018-02-13 (×5): 10 mL
  Administered 2018-02-14 – 2018-02-15 (×2): 30 mL

## 2018-02-10 MED ORDER — OXYCODONE HCL 5 MG PO TABS
5.0000 mg | ORAL_TABLET | ORAL | Status: DC | PRN
  Administered 2018-02-10 – 2018-02-15 (×7): 5 mg via ORAL
  Filled 2018-02-10 (×8): qty 1

## 2018-02-10 MED ORDER — SODIUM CHLORIDE 0.9 % IV SOLN
2.0000 g | Freq: Two times a day (BID) | INTRAVENOUS | Status: DC
  Administered 2018-02-10 – 2018-02-11 (×2): 2 g via INTRAVENOUS
  Filled 2018-02-10 (×3): qty 2

## 2018-02-10 NOTE — Progress Notes (Signed)
Pt extubated per MD order and placed on 4Lnc. Pt tolerated procedure well. Throat a little sore reinforced that it is normal and the pain would ease shortly. Will continue to monitor

## 2018-02-10 NOTE — Progress Notes (Signed)
Follow up - Critical Care Medicine Note  Patient Details:    Terri Berry is an 43 y.o. female. 43 year old current smoker, with multiple medical issues, recent cholecystectomy, elevated triglycerides and recurrent pancreatitis, presented with recurrent abdominal pain.  Intubated for airway protection after she became altered in the ED.  She had significant bradycardia with sinus pauses due to Precedex.  This was discontinued    Lines, Airways, Drains: PICC Double Lumen 02/10/18 PICC Right Brachial 40 cm 0 cm (Active)  Indication for Insertion or Continuance of Line Poor Vasculature-patient has had multiple peripheral attempts or PIVs lasting less than 24 hours 02/10/2018  8:00 PM  Exposed Catheter (cm) 0 cm 02/10/2018  2:27 PM  Site Assessment Intact;Dry;Clean 02/10/2018  3:00 PM  Lumen #1 Status Flushed;Infusing 02/10/2018  3:00 PM  Lumen #2 Status Flushed;Infusing 02/10/2018  3:00 PM  Dressing Type Transparent 02/10/2018  3:00 PM  Dressing Status Intact;Antimicrobial disc in place;Dry 02/10/2018  3:00 PM  Dressing Intervention New dressing 02/10/2018  2:27 PM  Dressing Change Due 02/17/18 02/10/2018  3:00 PM     Urethral Catheter Non-latex (Active)  Indication for Insertion or Continuance of Catheter Unstable critical patients (first 24-48 hours) 02/10/2018  8:00 PM  Site Assessment Intact 02/10/2018  1:00 PM  Catheter Maintenance Bag below level of bladder;Catheter secured;Drainage bag/tubing not touching floor;Insertion date on drainage bag;No dependent loops;Seal intact 02/10/2018  8:00 PM  Collection Container Standard drainage bag 02/10/2018  1:00 PM  Securement Method Securing device (Describe) 02/10/2018  8:03 AM  Output (mL) 350 mL 02/10/2018  1:00 PM    Anti-infectives:  Anti-infectives (From admission, onward)   Start     Dose/Rate Route Frequency Ordered Stop   02/09/18 0745  Ampicillin-Sulbactam (UNASYN) 3 g in sodium chloride 0.9 % 100 mL IVPB     3 g 200 mL/hr over 30 Minutes Intravenous  Once  02/09/18 0735 02/09/18 1349      Microbiology: Results for orders placed or performed during the hospital encounter of 02/09/18  MRSA PCR Screening     Status: None   Collection Time: 02/09/18  9:26 AM  Result Value Ref Range Status   MRSA by PCR NEGATIVE NEGATIVE Final    Comment:        The GeneXpert MRSA Assay (FDA approved for NASAL specimens only), is one component of a comprehensive MRSA colonization surveillance program. It is not intended to diagnose MRSA infection nor to guide or monitor treatment for MRSA infections. Performed at [facility], [address] [place], [place] [address]     Best Practice/Protocols:  VTE Prophylaxis: Lovenox (prophylaxtic dose) GI Prophylaxis: Proton Pump Inhibitor Ventilator Management/Weaning Protocol  Events: Admitted to ICU 09 February 2018 Intubated 09 February 2018  Studies: Dg Chest 1 View  Result Date: 02/09/2018 CLINICAL DATA:  Intubation EXAM: CHEST  1 VIEW COMPARISON:  11/29/2017 FINDINGS: Endotracheal tube tip is 2.5 cm above the carina. Nasogastric tube tip is at the gastroesophageal junction. There is mild bibasilar atelectasis. Upper lungs are clear. No effusions. IMPRESSION: Endotracheal tube well positioned. Nasogastric tube tip only at the gastroesophageal junction. Mild basilar atelectasis. Electronically Signed   By: Paulina Fusi M.D.   On: 02/09/2018 07:18   Ct Abdomen Pelvis W Contrast  Result Date: 02/09/2018 CLINICAL DATA:  Abdominal pain EXAM: CT ABDOMEN AND PELVIS WITH CONTRAST TECHNIQUE: Multidetector CT imaging of the abdomen and pelvis was performed using the standard protocol following bolus administration of intravenous contrast. CONTRAST:  75mL OMNIPAQUE IOHEXOL 300  MG/ML  SOLN COMPARISON:  CT abdomen pelvis-12/02/2017; 11/29/2017; abdominal chest radiograph - earlier same day FINDINGS: Lower chest: Limited visualization of the lower thorax demonstrates bibasilar consolidative opacity with associated  air bronchograms. No pleural effusion. Cardiomegaly.  No pericardial effusion. Hepatobiliary: Normal hepatic contour. No discrete hepatic lesions. Post cholecystectomy. Very mild centralized intrahepatic biliary duct dilatation, likely the sequela of prior cholecystectomy and associated biliary reservoir phenomenon. No ascites. Pancreas: There is diffuse ill-defined stranding centered about the pancreatic bed with moderate amount fluid extending along the left pericolic gutter. No definitive definable/drainable fluid collection/pseudocyst. There is homogeneous enhancement of the pancreatic parenchyma. No definitive pancreatic mass or pancreatic ductal dilatation on this non pancreatic protocol CT scan. The adjacent vasculature appears patent, in particular, the splenic artery and vein appear patent without discrete area of vessel irregularity on this non CTA examination. Spleen: Normal appearance of the spleen. Punctate splenule noted about the splenic hilum. Adrenals/Urinary Tract: There is symmetric enhancement and excretion of the bilateral kidneys. No definite renal stones on this postcontrast examination. No discrete renal lesions. No urinary obstruction or perinephric stranding. Normal appearance of the bilateral adrenal glands. The urinary bladder is decompressed with a Foley catheter. Stomach/Bowel: No evidence of enteric obstruction. Normal appearance of the terminal ileum and retrocecal appendix. Enteric tube tip terminates within the duodenal bulb. Enteric contrast extends to the level of the mid/distal small bowel. No pneumoperitoneum, pneumatosis or portal venous gas. Vascular/Lymphatic: Normal caliber of the abdominal aorta. The major branch vessels appear patent on this non CTA examination. As above, the peripancreatic vessels appear patent with special attention paid to the splenic artery and vein. Scattered porta hepatis and retroperitoneal lymph nodes are numerous though individually not enlarged  by size criteria, presumably reactive in etiology. No bulky retroperitoneal, mesenteric, pelvic or inguinal lymphadenopathy. Reproductive: Post hysterectomy. No discrete adnexal lesion. Small amount of fluid in the pelvic cul-de-sac. Other: Minimal amount of body wall edema about the midline of the low back. Musculoskeletal: No definite acute or aggressive osseous abnormalities. 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 IMPRESSION: 1. Findings compatible with recurrent acute extensive though uncomplicated pancreatitis, worse than the 11/29/2017 examination. Moderate amount of fluid about the pancreatic bed and tracking along the left pericolic gutter without definitive definable/drainable fluid collection/pseudocyst. No definitive evidence of pancreatic necrosis, pancreatic mass or definitive pancreatic ductal dilatation on this non pancreatic protocol CT scan. Post cholecystectomy without definitive radiopaque choledocholithiasis. 2. Consolidative opacities with associated air bronchograms within the imaged bilateral lung bases, potentially atelectasis though infection and/or aspiration could have a similar appearance. Clinical correlation is advised. 3. Enteric tube terminates within the duodenum bulb. Electronically Signed   By: Simonne Come M.D.   On: 02/09/2018 18:53   US Renal  Result Date: 02/09/2018 CLINICAL DATA:  Acute kidney injury EXAM: RENAL / URINARY TRACT ULTRASOUND COMPLETE COMPARISON:  None. FINDINGS: Right Kidney: Renal measurements: 10.5 x 4.8 x 4.8 cm = volume: 126 mL. Increased renal cortical echogenicity. No mass or hydronephrosis visualized. Left Kidney: Renal measurements: 10.8 x 0.9 x 5.4 cm = volume: 178 mL. Increased renal cortical echogenicity. No mass or hydronephrosis visualized. Bladder: Appears normal for degree of bladder distention. IMPRESSION: 1. Mild increased renal cortical echogenicity  bilaterally as can be seen with medical renal disease versus acute kidney injury. 2. No obstructive uropathy. Electronically Signed   By: Elige Ko   On: 02/09/2018 11:26   Dg Chest  Port 1 View  Result Date: 02/10/2018 CLINICAL DATA:  Fevers EXAM: PORTABLE CHEST 1 VIEW COMPARISON:  02/09/2018 FINDINGS: Endotracheal tube and nasogastric catheter are again seen now in satisfactory position. Cardiac shadow is stable. The overall inspiratory effort is poor with mild bibasilar atelectasis increased from the prior exam. No bony abnormality is noted. IMPRESSION: Mild bibasilar atelectasis new from the prior study. Electronically Signed   By: Alcide Clever M.D.   On: 02/10/2018 10:31   Dg Abd Portable 1v  Result Date: 02/09/2018 CLINICAL DATA:  Check gastric catheter placement EXAM: PORTABLE ABDOMEN - 1 VIEW COMPARISON:  Film from earlier in the same day. FINDINGS: Gastric catheter is been advanced and now lies in the region of the pyloric channel and possibly into the first portion of the duodenum. IMPRESSION: Gastric catheter as described Electronically Signed   By: Alcide Clever M.D.   On: 02/09/2018 16:34   Dg Abd Portable 1v  Result Date: 02/09/2018 CLINICAL DATA:  Nasogastric tube placement EXAM: PORTABLE ABDOMEN - 1 VIEW COMPARISON:  None. FINDINGS: Nasogastric tube with the tip projecting over the stomach, but the proximal port is above the esophagogastric junction. Recommend advancing the nasogastric tube 7.5 cm. There is no bowel dilatation to suggest obstruction. There is no evidence of pneumoperitoneum, portal venous gas or pneumatosis. There are no pathologic calcifications along the expected course of the ureters. Endotracheal tube with the tip 2.5 cm above the carina. Bibasilar airspace disease likely reflecting atelectasis. IMPRESSION: 1. Nasogastric tube with the tip projecting over the stomach, but the proximal port is above the esophagogastric junction. Recommend advancing the nasogastric tube  7.5 cm. Electronically Signed   By: Elige Ko   On: 02/09/2018 12:37   [place] Ekg Site Rite  Result Date: 02/10/2018 If Site Rite image not attached, placement could not be confirmed due to current cardiac rhythm.   Consults: Treatment Team:  Barbaraann Rondo, MD Salena Saner, MD   Subjective:    Overnight Issues:  No overt issues overnight.  She is awake and alert this morning, at times anxious and requesting Klonopin (takes it at home) by writing on a pad.  She is tolerating spontaneous breathing trial. Objective:  Vital signs for last 24 hours: Temp:  [98.7 F (37.1 C)-100.1 F (37.8 C)] 100.1 F (37.8 C) (01/07 2000) Pulse Rate:  [81-93] 89 (01/07 2100) Resp:  [13-24] 16 (01/07 2100) BP: (77-132)/(48-112) 87/67 (01/07 2100) SpO2:  [87 %-98 %] 96 % (01/07 2100) FiO2 (%):  [40 %] 40 % (01/07 0706) Weight:  [100.8 kg] 100.8 kg (01/07 0500)  Hemodynamic parameters for last 24 hours:    Intake/Output from previous day: 01/06 0701 - 01/07 0700 In: 2924.9 [I.V.:2824.9; IV Piggyback:100] Out: 2975 [Urine:2325; Emesis/NG output:650]  Intake/Output this shift: No intake/output data recorded.  Vent settings for last 24 hours: Vent Mode: PRVC FiO2 (%):  [40 %] 40 % Set Rate:  [20 bmp] 20 bmp Vt Set:  [500 mL] 500 mL PEEP:  [2 cmH20-5 cmH20] 5 cmH20  Physical Exam:  Nursing note and vitals reviewed. Constitutional: Mechanically ventilated.  Anxious, does follow commands. HENT:  Head: Normocephalic and atraumatic.  Mouth/Throat: No oropharyngeal exudate.  Orotracheally intubated  Eyes: Pupils are equal, round, and reactive to light. Conjunctivae are normal. No scleral icterus.  Neck: Supple. No tracheal deviation present. Cardiovascular: Regular rhythm, S1 normal, S2 normal and intact distal pulses.   Normal rate present. Exam reveals no gallop.  No murmur heard. Respiratory: Effort normal and  breath sounds normal.  Tolerating spontaneous breathing trial  GI:  She exhibits no distension and no mass. There is abdominal tenderness, decreased from yesterday. There is no rebound.  Obese  Musculoskeletal: Normal range of motion.        General: No edema.   Neurological:  Anxious, does follow commands.  No focal deficit noted. Skin: Skin is warm and dry. No rash noted.  Psychiatric:  Anxious but easily redirected.  Follows commands.  Assessment/Plan:   1.  Acute respiratory failure: This appears to have been secondary to loss of airway protection due to her initial agitation and then lethargy.  Uncertain what triggered these events however it appears that the patient does use significant amount of benzodiazepines and pain medications at home and this may have been medication effect.  Today she is awake and follows commands.  She is somewhat anxious but in no respiratory distress.  She is tolerating spontaneous breathing trial.  Plan will be to proceed with extubation.  2.  Acute pancreatitis: The patient has elevated lipase at over 4000 yesterday.  Today's lipase is in the in 300 range.  Query cause.  She may need to be reassessed in this regard.  Possibilities include unknown substance ingested, vasculitides, structural pancreatic duct issues.  3.  Bradycardia: This appears to have been related to Precedex infusion, this has now resolved.  4.  Elevated triglycerides: Continue lipid-lowering medications.  4.  Acute kidney injury: This appears to be due to mild volume depletion, will replete volume.  This has not worsened.  Continue to monitor renal function and avoid nephrotoxins.  5.  Hypocalcemia and hypomagnesemia, replete.  Continue to monitor.  6.  Leukocytosis on downward trend.  Monitor.  7.  Fever, no evidence of infiltrate on chest x-ray.  She does have atelectasis and may be related to this.  As noted above leukocytosis on downward trend and she does not appear toxic.  May be related to her acute pancreatitis and atelectasis.  Plan to  mobilize after extubation to help with issues with atelectasis.  8.  Obesity: This issue adds complexity to her management.     LOS: 1 day   Additional comments:Discussed during multidisciplinary ICU rounds, discussed with Dr. Enid Baasjie  Critical Care Total Time*: 35Minutes  C. Danice GoltzLaura Gonzalez, MD [facility]  02/10/2018  *Care during the described time interval was provided by me and/or other providers on the critical care team.  I have reviewed this patient's available data, including medical history, events of note, physical examination and test results as part of my evaluation.

## 2018-02-10 NOTE — Progress Notes (Signed)
Peripherally Inserted Central Catheter/Midline Placement  The IV Nurse has discussed with the patient and/or persons authorized to consent for the patient, the purpose of this procedure and the potential benefits and risks involved with this procedure.  The benefits include less needle sticks, lab draws from the catheter, and the patient may be discharged home with the catheter. Risks include, but not limited to, infection, bleeding, blood clot (thrombus formation), and puncture of an artery; nerve damage and irregular heartbeat and possibility to perform a PICC exchange if needed/ordered by physician.  Alternatives to this procedure were also discussed.  Bard Power PICC patient education guide, fact sheet on infection prevention and patient information card has been provided to patient /or left at bedside.    PICC/Midline Placement Documentation  PICC Double Lumen 02/10/18 PICC Right Brachial 40 cm 0 cm (Active)  Indication for Insertion or Continuance of Line Poor Vasculature-patient has had multiple peripheral attempts or PIVs lasting less than 24 hours 02/10/2018  2:27 PM  Exposed Catheter (cm) 0 cm 02/10/2018  2:27 PM  Site Assessment Intact 02/10/2018  2:27 PM  Lumen #1 Status Flushed;Blood return noted 02/10/2018  2:27 PM  Lumen #2 Status Flushed;No blood return 02/10/2018  2:27 PM  Dressing Type Transparent 02/10/2018  2:27 PM  Dressing Status Clean;Dry;Intact;Antimicrobial disc in place 02/10/2018  2:27 PM  Dressing Intervention New dressing 02/10/2018  2:27 PM  Dressing Change Due 02/17/18 02/10/2018  2:27 PM    Verbal consent by patient   Terri Berry 02/10/2018, 3:12 PM

## 2018-02-10 NOTE — Care Management Note (Signed)
Case Management Note  Patient Details  Name: Julyssa Blackler MRN: [id number] Date of Birth: 1975/03/14  Subjective/Objective:    Patient admitted with acute hypoxic respiratory failure requiring intubation.  Patient extubated earlier today and tolerating [place] well.  Patient has a history of pancreatitis.  Patient is from home, reports her father lives with her and he has some dementia, her nephew is staying with him now.  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  Patient's PCP is with [facility] and her prescriptions come from [facility] part of their charity assistance.  Patient does not drive and reports she has not driven in a year because she needs glasses.  Patient reports that her father still drives and provides her transportation.  No needs identified at this time. Robbie Lis RN BSN [phone]               Action/Plan:   Expected Discharge Date:                  Expected Discharge Plan:  Home/Self Care  In-House Referral:     Discharge planning Services  CM Consult  Post Acute Care Choice:    Choice offered to:     DME Arranged:    DME Agency:     HH Arranged:    HH Agency:     Status of Service:  Completed, signed off  If discussed at Long Length of Stay Meetings, dates discussed:    Additional Comments:  Allayne Butcher, RN 02/10/2018, 4:06 PM

## 2018-02-10 NOTE — Progress Notes (Addendum)
Pharmacy Antibiotic Note  Judee Molenaar is a 43 y.o. female admitted on 02/09/2018 with sepsis.  Pharmacy has been consulted for cefepime and vancomycin dosing.  Plan: Cefepime 2 grams q 12 hours ordered  Vancomycin 2 gram lead dose ordered. DW 101 kg  Vd 66L kei 0.039 hr-1  T1/2 18 hours  Vancomycin 1250 mg IV Q 24 hrs. Goal AUC 400-550. Expected AUC: 487 SCr used: 1.67   Height: 5\' 7"  (170.2 cm) Weight: 222 lb 3.6 oz (100.8 kg) IBW/kg (Calculated) : 61.6  Temp (24hrs), Avg:99.5 F (37.5 C), Min:98.7 F (37.1 C), Max:100.1 F (37.8 C)  Recent Labs  Lab 02/09/18 0536 02/09/18 1437 02/10/18 0617 02/10/18 2058  WBC 17.1*  --  13.7* 19.6*  CREATININE 2.05* 1.70* 1.65* 1.67*    Estimated Creatinine Clearance: 52.5 mL/min (A) (by C-G formula based on SCr of 1.67 mg/dL (H)).    Allergies  Allergen Reactions  . Precedex [Dexmedetomidine Hcl In Nacl] Other (See Comments)    Significant Bradycardia  . Cefpodoxime     Tolerates augmentin and Keflex   . Clarithromycin   . Doxycycline   . Ferrous Gluconate   . Lactose Intolerance (Gi)   . Levofloxacin   . Rizatriptan   . Sulfa Antibiotics   . Topiramate   . Venlafaxine   . Zofran [Ondansetron Hcl]     Antimicrobials this admission: Unasyn x1; Metronidazole, cefepime 1/7  >>    >>   Dose adjustments this admission:   Microbiology results: 1/6 MRSA PCR: (-)     1/7 CXR: atelectasis 1/6 UA: (-) Thank you for allowing pharmacy to be a part of this patient's care.  Tangela Dolliver S 02/10/2018 10:01 PM

## 2018-02-10 NOTE — Progress Notes (Signed)
[employer] - [place] at [facility]   PATIENT NAME: Terri Berry    MR#:  [id number]  DATE OF BIRTH:  [birth year]  SUBJECTIVE:  CHIEF COMPLAINT: Patient's initial complaint was abdominal pain.  While in the emergency room patient noted to have become agitated and delirious with copious nasopharyngeal secretions.  Had to be intubated for airway protection.  This morning no new complaints.  Patient appears to be following instruction.  Notified about plans for extubation today by critical care physician.  REVIEW OF SYSTEMS:  Review of Systems  Unable to perform ROS: Medical condition  Patient sedated on the vent  DRUG ALLERGIES:   Allergies  Allergen Reactions  . Precedex [Dexmedetomidine Hcl In Nacl] Other (See Comments)    Significant Bradycardia  . Cefpodoxime     Tolerates augmentin and Keflex   . Clarithromycin   . Doxycycline   . Ferrous Gluconate   . Lactose Intolerance (Gi)   . Levofloxacin   . Rizatriptan   . Sulfa Antibiotics   . Topiramate   . Venlafaxine   . Zofran [Ondansetron Hcl]    VITALS:  Blood pressure 94/70, pulse 85, temperature 98.7 F (37.1 C), resp. rate 13, height 5\' 7"  (1.702 m), weight 100.8 kg, SpO2 95 %. PHYSICAL EXAMINATION:   Physical Exam  Constitutional:  Patient on the vent   HENT:  Head: Normocephalic and atraumatic.  Eyes: Pupils are equal, round, and reactive to light. Right eye exhibits no discharge.  Neck: Normal range of motion. No thyromegaly present.  Cardiovascular: Normal rate and regular rhythm.  Respiratory: Effort normal and breath sounds normal. No respiratory distress.  Patient on the vent  GI: Soft. Bowel sounds are normal. She exhibits no distension.  Musculoskeletal:        General: No edema.  Neurological:  Patient on the vent  Skin: Skin is warm and dry.   LABORATORY PANEL:  Female CBC Recent Labs  Lab 02/10/18 0617  WBC 13.7*  HGB 10.5*  HCT 34.5*  PLT 251    ------------------------------------------------------------------------------------------------------------------ Chemistries  Recent Labs  Lab 02/09/18 0536  02/10/18 0617  NA 138   < > 135  K 4.7   < > 3.8  CL 110   < > 106  CO2 18*   < > 22  GLUCOSE 155*   < > 91  BUN 28*   < > 21*  CREATININE 2.05*   < > 1.65*  CALCIUM 8.8*   < > 8.1*  MG  --   --  1.3*  AST 20  --   --   ALT 14  --   --   ALKPHOS 50  --   --   BILITOT 0.3  --   --    < > = values in this interval not displayed.   RADIOLOGY:  Ct Abdomen Pelvis W Contrast  Result Date: 02/09/2018 CLINICAL DATA:  Abdominal pain EXAM: CT ABDOMEN AND PELVIS WITH CONTRAST TECHNIQUE: Multidetector CT imaging of the abdomen and pelvis was performed using the standard protocol following bolus administration of intravenous contrast. CONTRAST:  75mL OMNIPAQUE IOHEXOL 300 MG/ML  SOLN COMPARISON:  CT abdomen pelvis-12/02/2017; 11/29/2017; abdominal chest radiograph - earlier same day FINDINGS: Lower chest: Limited visualization of the lower thorax demonstrates bibasilar consolidative opacity with associated air bronchograms. No pleural effusion. Cardiomegaly.  No pericardial effusion. Hepatobiliary: Normal hepatic contour. No discrete hepatic lesions. Post cholecystectomy. Very mild centralized intrahepatic biliary duct dilatation, likely the sequela of prior cholecystectomy  and associated biliary reservoir phenomenon. No ascites. Pancreas: There is diffuse ill-defined stranding centered about the pancreatic bed with moderate amount fluid extending along the left pericolic gutter. No definitive definable/drainable fluid collection/pseudocyst. There is homogeneous enhancement of the pancreatic parenchyma. No definitive pancreatic mass or pancreatic ductal dilatation on this non pancreatic protocol CT scan. The adjacent vasculature appears patent, in particular, the splenic artery and vein appear patent without discrete area of vessel irregularity  on this non CTA examination. Spleen: Normal appearance of the spleen. Punctate splenule noted about the splenic hilum. Adrenals/Urinary Tract: There is symmetric enhancement and excretion of the bilateral kidneys. No definite renal stones on this postcontrast examination. No discrete renal lesions. No urinary obstruction or perinephric stranding. Normal appearance of the bilateral adrenal glands. The urinary bladder is decompressed with a Foley catheter. Stomach/Bowel: No evidence of enteric obstruction. Normal appearance of the terminal ileum and retrocecal appendix. Enteric tube tip terminates within the duodenal bulb. Enteric contrast extends to the level of the mid/distal small bowel. No pneumoperitoneum, pneumatosis or portal venous gas. Vascular/Lymphatic: Normal caliber of the abdominal aorta. The major branch vessels appear patent on this non CTA examination. As above, the peripancreatic vessels appear patent with special attention paid to the splenic artery and vein. Scattered porta hepatis and retroperitoneal lymph nodes are numerous though individually not enlarged by size criteria, presumably reactive in etiology. No bulky retroperitoneal, mesenteric, pelvic or inguinal lymphadenopathy. Reproductive: Post hysterectomy. No discrete adnexal lesion. Small amount of fluid in the pelvic cul-de-sac. Other: Minimal amount of body wall edema about the midline of the low back. Musculoskeletal: No definite acute or aggressive osseous abnormalities. 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 IMPRESSION: 1. Findings compatible with recurrent acute extensive though uncomplicated pancreatitis, worse than the 11/29/2017 examination. Moderate amount of fluid about the pancreatic bed and tracking along the left pericolic gutter without definitive definable/drainable fluid collection/pseudocyst. No definitive evidence of pancreatic  necrosis, pancreatic mass or definitive pancreatic ductal dilatation on this non pancreatic protocol CT scan. Post cholecystectomy without definitive radiopaque choledocholithiasis. 2. Consolidative opacities with associated air bronchograms within the imaged bilateral lung bases, potentially atelectasis though infection and/or aspiration could have a similar appearance. Clinical correlation is advised. 3. Enteric tube terminates within the duodenum bulb. Electronically Signed   By: Simonne ComeJohn  Watts M.D.   On: 02/09/2018 18:53   Dg Chest Port 1 View  Result Date: 02/10/2018 CLINICAL DATA:  Fevers EXAM: PORTABLE CHEST 1 VIEW COMPARISON:  02/09/2018 FINDINGS: Endotracheal tube and nasogastric catheter are again seen now in satisfactory position. Cardiac shadow is stable. The overall inspiratory effort is poor with mild bibasilar atelectasis increased from the prior exam. No bony abnormality is noted. IMPRESSION: Mild bibasilar atelectasis new from the prior study. Electronically Signed   By: Alcide CleverMark  Lukens M.D.   On: 02/10/2018 10:31   [place] Ekg Site Rite  Result Date: 02/10/2018 If Site Rite image not attached, placement could not be confirmed due to current cardiac rhythm.  ASSESSMENT AND PLAN:   17F w/ PMHx obesity, T2NIDDM, HTN, HLD, IBS, fibromyalgia, depression, p/w AP/N/V/D, overdose Who initially presented to the emergency room with abdominal pains.  Subsequently documented to have become more confused and delirious.  Was having copious nasopharyngeal secretions from the upper airway and had to be intubated for airway protection.  Subsequently admitted to the intensive care unit.   1.  Acute metabolic encephalopathy Multifactorial.  Patient reported  to have become agitated and delirious in the emergency room.  Noted to have increased secretions with evidence of hypoxic respiratory failure.  Was intubated for airway protection. Being managed by intensivist.  Plans possible extubation today  2.  Acute  kidney injury Renal function gradually improving with IV fluid hydration. Follow-up on renal function in a.m. renal ultrasound done with mild increased renal cortical echogenicity bilaterally consistent with medical renal disease versus acute kidney injury.  No obstructive uropathy.  Avoid nephrotoxic agents.  3.  Diabetes mellitus type 2 Sliding scale insulin coverage for now  4.  Acute pancreatitis  Felt to be secondary to hypertriglyceridemia with triglyceride level of 595 Patient currently n.p.o.  IV fluid hydration.  Noted significant improvement in lipase level this morning.  6.  Leukocytosis.  Improving Felt to be reactive in nature.  Concern for aspiration.  Chest x-ray negative for consolidation. .  Monitor clinically  DVT PPx: Lovenox.  CODE STATUS: Full Code  TOTAL TIME TAKING CARE OF THIS PATIENT: 38 minutes.   More than 50% of the time was spent in counseling/coordination of care: YES  POSSIBLE D/C IN 3 DAYS, DEPENDING ON CLINICAL CONDITION.   Jadien Lehigh M.D on 02/10/2018 at 5:12 PM  Between 7am to 6pm - Pager - [phone]  After 6pm go to [email or web address] - Social research officer, government  [employer] [place] [facility]  Office  [phone]  CC: Primary care physician; Thomes Dinning, MD  Note: This dictation was prepared with Dragon dictation along with smaller phrase technology. Any transcriptional errors that result from this process are unintentional.

## 2018-02-11 LAB — BASIC METABOLIC PANEL
Anion gap: 7 (ref 5–15)
BUN: 19 mg/dL (ref 6–20)
CO2: 24 mmol/L (ref 22–32)
Calcium: 8.2 mg/dL — ABNORMAL LOW (ref 8.9–10.3)
Chloride: 104 mmol/L (ref 98–111)
Creatinine, Ser: 1.46 mg/dL — ABNORMAL HIGH (ref 0.44–1.00)
GFR calc Af Amer: 50 mL/min — ABNORMAL LOW (ref >60)
GFR calc non Af Amer: 43 mL/min — ABNORMAL LOW (ref >60)
Glucose, Bld: 128 mg/dL — ABNORMAL HIGH (ref 70–99)
Potassium: 3.8 mmol/L (ref 3.5–5.1)
Sodium: 135 mmol/L (ref 135–145)

## 2018-02-11 LAB — TRIGLYCERIDES: TRIGLYCERIDES: 344 mg/dL — AB (ref <150)

## 2018-02-11 LAB — GLUCOSE, CAPILLARY
GLUCOSE-CAPILLARY: 113 mg/dL — AB (ref 70–99)
Glucose-Capillary: 117 mg/dL — ABNORMAL HIGH (ref 70–99)
Glucose-Capillary: 110 mg/dL — ABNORMAL HIGH (ref 70–99)
Glucose-Capillary: 113 mg/dL — ABNORMAL HIGH (ref 70–99)

## 2018-02-11 LAB — LIPASE, BLOOD: LIPASE: 113 U/L — AB (ref 11–51)

## 2018-02-11 LAB — CBC
HCT: 29.1 % — ABNORMAL LOW (ref 36.0–46.0)
Hemoglobin: 9 g/dL — ABNORMAL LOW (ref 12.0–15.0)
MCH: 26.8 pg (ref 26.0–34.0)
MCHC: 30.9 g/dL (ref 30.0–36.0)
MCV: 86.6 fL (ref 80.0–100.0)
NRBC: 0 % (ref 0.0–0.2)
Platelets: 224 10*3/uL (ref 150–400)
RBC: 3.36 MIL/uL — ABNORMAL LOW (ref 3.87–5.11)
RDW: 18.1 % — ABNORMAL HIGH (ref 11.5–15.5)
WBC: 19 10*3/uL — ABNORMAL HIGH (ref 4.0–10.5)

## 2018-02-11 MED ORDER — VANCOMYCIN HCL 10 G IV SOLR
2000.0000 mg | Freq: Once | INTRAVENOUS | Status: AC
  Administered 2018-02-11: 2000 mg via INTRAVENOUS
  Filled 2018-02-11: qty 2000

## 2018-02-11 MED ORDER — SIMETHICONE 80 MG PO CHEW
80.0000 mg | CHEWABLE_TABLET | Freq: Four times a day (QID) | ORAL | Status: DC | PRN
  Administered 2018-02-11 – 2018-02-12 (×4): 80 mg via ORAL
  Filled 2018-02-11 (×5): qty 1

## 2018-02-11 MED ORDER — MIRTAZAPINE 15 MG PO TABS
30.0000 mg | ORAL_TABLET | Freq: Every day | ORAL | Status: DC
  Administered 2018-02-11 – 2018-02-15 (×4): 30 mg via ORAL
  Filled 2018-02-11 (×4): qty 2

## 2018-02-11 MED ORDER — HYDROXYZINE HCL 25 MG PO TABS
25.0000 mg | ORAL_TABLET | Freq: Three times a day (TID) | ORAL | Status: DC | PRN
  Administered 2018-02-11: 21:00:00 25 mg via ORAL
  Filled 2018-02-11 (×2): qty 1

## 2018-02-11 MED ORDER — SODIUM CHLORIDE 0.9 % IV SOLN
INTRAVENOUS | Status: DC | PRN
  Administered 2018-02-11 – 2018-02-13 (×3): 250 mL via INTRAVENOUS
  Administered 2018-02-14: 500 mL via INTRAVENOUS

## 2018-02-11 MED ORDER — SODIUM CHLORIDE 0.9 % IV SOLN
3.0000 g | Freq: Four times a day (QID) | INTRAVENOUS | Status: DC
  Administered 2018-02-11 – 2018-02-15 (×14): 3 g via INTRAVENOUS
  Filled 2018-02-11 (×20): qty 3

## 2018-02-11 MED ORDER — VANCOMYCIN HCL 10 G IV SOLR: 1250.0000 mg | INTRAVENOUS | Status: DC

## 2018-02-11 MED ORDER — TRAMADOL HCL 50 MG PO TABS: 50.0000 mg | ORAL_TABLET | Freq: Four times a day (QID) | ORAL | Status: DC | PRN

## 2018-02-11 NOTE — Evaluation (Signed)
Physical Therapy Evaluation Patient Details Name: Terri Berry MRN: [id number] DOB: 1975/06/09 Today's Date: 02/11/2018   History of Present Illness  Pt is 43 yo female presented to ED with abdominal pain, became altered/hypoxic, underwent intubation for airway protection. PMH of HTN, HLD, IBS, fibromyalgia, depression, s/p cholecystectomy, pancreatitis  Clinical Impression  Pt alert, oriented to self, place, situation, disoriented to time, behavior WFLs though very anxious, chattering teeth and max encouragement to participate needed. Pt reported living in mobile home with father, previously independent in gait/ADLs/IADLs.   The patient complained of abdominal pain of 10/10 throughout session. Increased pain with LE exercises, limited ROM L>R pt said due to fibromyalgia. PT and pt spent time discussing importance of mobility and movement in hospital to encourage pt to participate. Pt with groaning, crying, unable to articulate status when supine<>sit EOB with maxAx2, returned to supine and repositioned. PT provided active listening, comfort and cues for deep breathing for pain management/anxiety. At this time pt demonstrated significant limitations in functional activities, mobility, and independence and would benefit from further skilled PT intervention to address these and return pt to PLOF.     Follow Up Recommendations SNF    Equipment Recommendations  Other (comment)(TBD at next venue of care)    Recommendations for Other Services       Precautions / Restrictions Precautions Precautions: Fall Restrictions Weight Bearing Restrictions: No      Mobility  Bed Mobility Overal bed mobility: Needs Assistance Bed Mobility: Supine to Sit;Sit to Supine     Supine to sit: +2 for physical assistance;Max assist Sit to supine: Max assist;+2 for physical assistance      Transfers                 General transfer comment: Deferred at this time. Pt with significant stomach pain,  moaning, unable to verbalize due to pain  Ambulation/Gait                Stairs            Wheelchair Mobility    Modified Rankin (Stroke Patients Only)       Balance Overall balance assessment: Needs assistance Sitting-balance support: No upper extremity supported;Feet unsupported Sitting balance-Leahy Scale: Poor                                       Pertinent Vitals/Pain Pain Assessment: 0-10 Pain Score: 10-Worst pain ever Pain Location: stomach Pain Descriptors / Indicators: Discomfort;Grimacing;Guarding;Moaning Pain Intervention(s): Limited activity within patient's tolerance;Monitored during session    Home Living Family/patient expects to be discharged to:: Private residence Living Arrangements: Parent Available Help at Discharge: Family Type of Home: House Home Access: Stairs to enter Entrance Stairs-Rails: (cannot currently remember, chart review says both L and R) Entrance Stairs-Number of Steps: 4-5 Home Layout: One level Home Equipment: None      Prior Function Level of Independence: Independent               Hand Dominance        Extremity/Trunk Assessment   Upper Extremity Assessment Upper Extremity Assessment: Generalized weakness(R PICC line )    Lower Extremity Assessment Lower Extremity Assessment: Generalized weakness       Communication   Communication: No difficulties  Cognition Arousal/Alertness: Awake/alert Behavior During Therapy: WFL for tasks assessed/performed Overall Cognitive Status: Within Functional Limits for tasks assessed  General Comments      Exercises General Exercises - Lower Extremity Ankle Circles/Pumps: 10 reps;AAROM;Both Heel Slides: AAROM;10 reps;Both Hip ABduction/ADduction: AAROM;Both;10 reps   Assessment/Plan    PT Assessment Patient needs continued PT services  PT Problem List Decreased  strength;Pain;Decreased range of motion;Decreased activity tolerance;Decreased knowledge of use of DME;Decreased balance;Decreased safety awareness;Decreased mobility;Obesity;Decreased knowledge of precautions       PT Treatment Interventions DME instruction;Therapeutic exercise;Gait training;Balance training;Stair training;Neuromuscular re-education;Functional mobility training;Therapeutic activities;Patient/family education    PT Goals (Current goals can be found in the Care Plan section)  Acute Rehab PT Goals Patient Stated Goal: Pt wants stomach pain to decrease PT Goal Formulation: With patient Time For Goal Achievement: 02/25/18 Potential to Achieve Goals: Good    Frequency Min 2X/week   Barriers to discharge        Co-evaluation               AM-PAC PT "6 Clicks" Mobility  Outcome Measure Help needed turning from your back to your side while in a flat bed without using bedrails?: Total Help needed moving from lying on your back to sitting on the side of a flat bed without using bedrails?: Total Help needed moving to and from a bed to a chair (including a wheelchair)?: Total Help needed standing up from a chair using your arms (e.g., wheelchair or bedside chair)?: Total Help needed to walk in hospital room?: Total Help needed climbing 3-5 steps with a railing? : Total 6 Click Score: 6    End of Session Equipment Utilized During Treatment: Gait belt;Oxygen(3L) Activity Tolerance: Patient limited by pain;Patient limited by fatigue Patient left: in bed;with call bell/phone within reach;with bed alarm set Nurse Communication: Mobility status PT Visit Diagnosis: Other abnormalities of gait and mobility (R26.89);Muscle weakness (generalized) (M62.81);Difficulty in walking, not elsewhere classified (R26.2)    Time: [id number] PT Time Calculation (min) (ACUTE ONLY): 40 min   Charges:   PT Evaluation $PT Eval Moderate Complexity: 1 Mod PT Treatments $Therapeutic  Activity: 23-37 mins       Olga Coasteriana Jayceion Lisenby PT, DPT 12:49 PM,02/11/18 [phone]

## 2018-02-11 NOTE — Progress Notes (Signed)
[employer] - [place] at [facility]   PATIENT NAME: Terri Berry    MR#:  [id number]  DATE OF BIRTH:  February 08, 1975  SUBJECTIVE:  CHIEF COMPLAINT: Patient's initial complaint was abdominal pain.  While in the emergency room patient noted to have become agitated and delirious with copious nasopharyngeal secretions.  Had to be intubated for airway protection.  This morning no new complaints.  Patient extubated on 02/10/2018.  Doing well this morning.  Gradually tolerating diet.  REVIEW OF SYSTEMS:  Review of Systems  Constitutional: Negative for chills and fever.  HENT: Negative for hearing loss and tinnitus.   Eyes: Negative for blurred vision and double vision.  Respiratory: Negative for cough and hemoptysis.   Cardiovascular: Negative for chest pain and palpitations.  Gastrointestinal: Negative for heartburn and nausea.       Abdominal pains improved  Genitourinary: Negative for dysuria and urgency.  Musculoskeletal: Negative for myalgias and neck pain.  Skin: Negative for rash.  Neurological: Negative for dizziness and headaches.  Psychiatric/Behavioral: Negative for depression and hallucinations.     DRUG ALLERGIES:   Allergies  Allergen Reactions  . Precedex [Dexmedetomidine Hcl In Nacl] Other (See Comments)    Significant Bradycardia  . Cefpodoxime     Tolerates augmentin and Keflex   . Clarithromycin   . Doxycycline   . Ferrous Gluconate   . Lactose Intolerance (Gi)   . Levofloxacin   . Rizatriptan   . Sulfa Antibiotics   . Topiramate   . Venlafaxine   . Zofran [Ondansetron Hcl]    VITALS:  Blood pressure 103/69, pulse 83, temperature 98.6 F (37 C), temperature source Oral, resp. rate 15, height 5\' 7"  (1.702 m), weight 102.6 kg, SpO2 94 %. PHYSICAL EXAMINATION:   Physical Exam  Constitutional: She is oriented to person, place, and time. She appears well-developed and well-nourished.  HENT:  Head: Normocephalic and atraumatic.  Eyes:  Pupils are equal, round, and reactive to light. Right eye exhibits no discharge.  Neck: Normal range of motion. Neck supple. No thyromegaly present.  Cardiovascular: Normal rate and regular rhythm.  Respiratory: Effort normal and breath sounds normal. No respiratory distress.  GI: Soft. Bowel sounds are normal. She exhibits no distension.  Musculoskeletal:        General: No edema.  Neurological: She is alert and oriented to person, place, and time.  Skin: Skin is warm and dry.  Psychiatric: She has a normal mood and affect. Her behavior is normal.   LABORATORY PANEL:  Female CBC Recent Labs  Lab 02/11/18 0553  WBC 19.0*  HGB 9.0*  HCT 29.1*  PLT 224   ------------------------------------------------------------------------------------------------------------------ Chemistries  Recent Labs  Lab 02/09/18 0536  02/10/18 0617  02/11/18 0553  NA 138   < > 135   < > 135  K 4.7   < > 3.8   < > 3.8  CL 110   < > 106   < > 104  CO2 18*   < > 22   < > 24  GLUCOSE 155*   < > 91   < > 128*  BUN 28*   < > 21*   < > 19  CREATININE 2.05*   < > 1.65*   < > 1.46*  CALCIUM 8.8*   < > 8.1*   < > 8.2*  MG  --   --  1.3*  --   --   AST 20  --   --   --   --  ALT 14  --   --   --   --   ALKPHOS 50  --   --   --   --   BILITOT 0.3  --   --   --   --    < > = values in this interval not displayed.   RADIOLOGY:  Ct Abdomen Pelvis Wo Contrast  Result Date: 02/10/2018 CLINICAL DATA:  History of generalized abdominal pain and recurrent pancreatitis EXAM: CT ABDOMEN AND PELVIS WITHOUT CONTRAST TECHNIQUE: Multidetector CT imaging of the abdomen and pelvis was performed following the standard protocol without IV contrast. COMPARISON:  CT 02/09/2018, 12/02/2017 FINDINGS: Lower chest: Lung bases demonstrate persistent lower lobe consolidations. No significant pleural effusion. Borderline heart size. Hepatobiliary: Status post cholecystectomy. No focal hepatic abnormality. No significant biliary  dilatation Pancreas: Continued extensive peripancreatic inflammatory change and fluid within the pararenal spaces and gutters. More focal fluid collections are seen adjacent to the greater curvature of the stomach measuring 3.5 cm and along the lesser curve of the stomach measuring 4 cm. Spleen: Normal in size without focal abnormality. Adrenals/Urinary Tract: Adrenal glands are unremarkable. Kidneys are normal, without renal calculi, focal lesion, or hydronephrosis. Bladder contains Foley catheter and small amount of air. Stomach/Bowel: Stomach is nonenlarged. No dilated small bowel. No colon wall thickening. Negative appendix Vascular/Lymphatic: Limited evaluation without contrast. Multiple subcentimeter retroperitoneal nodes Reproductive: Status post hysterectomy. Other: No free air.  Moderate free fluid in the pelvis. Musculoskeletal: Moderate severe degenerative change L4-L5 with endplate sclerosis as before. IMPRESSION: 1. Continued extensive peripancreatic inflammatory change and fluid consistent with ongoing pancreatitis. Suspect that there are developing perigastric organizing fluid collections. 2. Persistent bilateral lower lobe consolidations, which may reflect pneumonia and or aspiration 3. Moderate fluid within the pelvis Electronically Signed   By: Jasmine PangKim  Fujinaga M.D.   On: 02/10/2018 23:07   ASSESSMENT AND PLAN:   64F w/ PMHx obesity, T2NIDDM, HTN, HLD, IBS, fibromyalgia, depression, p/w AP/N/V/D, overdose Who initially presented to the emergency room with abdominal pains.  Subsequently documented to have become more confused and delirious.  Was having copious nasopharyngeal secretions from the upper airway and had to be intubated for airway protection.  Subsequently admitted to the intensive care unit.   1.  Acute metabolic encephalopathy Multifactorial.  Patient reported to have become agitated and delirious in the emergency room.  Noted to have increased secretions with evidence of hypoxic  respiratory failure.  Was intubated for airway protection.  Patient was subsequently extubated on 02/10/2018.  Awake and alert and oriented this morning.  2.  Acute kidney injury Renal function gradually improving with IV fluid hydration. Follow-up on renal function in a.m. renal ultrasound done with mild increased renal cortical echogenicity bilaterally consistent with medical renal disease versus acute kidney injury.  No obstructive uropathy.  Avoid nephrotoxic agents.  3.  Diabetes mellitus type 2 Sliding scale insulin coverage for now  4.  Acute pancreatitis  Felt to be secondary to hypertriglyceridemia with triglyceride level of 595 Noted significant improvement in lipase level this morning.  Already started on a diet and patient gradually tolerating.  6.  Leukocytosis.  Still has leukocytosis   Concern for aspiration. Patient currently on empiric antibiotics with vancomycin with pharmacy to dose.  Also Unasyn.  Follow-up on CBC in a.m. Chest x-ray negative for consolidation. .  Monitor clinically  7.  Debility due to multiple medical problems. Seen by physical therapist.  Recommendation is for skilled nursing facility placement on discharge.  DVT  PPx: Lovenox.  CODE STATUS: Full Code  TOTAL TIME TAKING CARE OF THIS PATIENT: 38 minutes.   More than 50% of the time was spent in counseling/coordination of care: YES  POSSIBLE D/C IN 3 DAYS, DEPENDING ON CLINICAL CONDITION.   Rosio Weiss M.D on 02/11/2018 at 3:34 PM  Between 7am to 6pm - Pager - [phone]  After 6pm go to [email or web address] - Social research officer, government  [employer]  [facility]  Office  [phone]  CC: Primary care physician; Thomes Dinning, MD  Note: This dictation was prepared with Dragon dictation along with smaller phrase technology. Any transcriptional errors that result from this process are unintentional.

## 2018-02-11 NOTE — Clinical Social Work Note (Signed)
CSW consulted for potential nursing home placement. PT has not yet assessed patient to make recommendations and patient does not have a payor source for placement and would need to pay out of pocket if that is the PT recommendations.  York Spaniel MSW,LCSW [phone]

## 2018-02-11 NOTE — Progress Notes (Signed)
Pt being transferred to room 127. Report called to Morrie Sheldon, Charity fundraiser. Pt and belongings transferred to room 127 without incident.

## 2018-02-11 NOTE — Treatment Plan (Signed)
Extubated yesterday without sequela.  Multidisciplinary rounds were performed with the ICU team.  No issues overnight.  Patient transferred to MedSurg ward.  She is on Unasyn for aspiration pneumonitis.  Recommend treatment for 7 days may switch to p.o. Augmentin once taking p.o. reliably.  Pulmonary/Critical Care will sign off please reconsult as needed

## 2018-02-12 LAB — CBC
HCT: 28.1 % — ABNORMAL LOW (ref 36.0–46.0)
Hemoglobin: 8.8 g/dL — ABNORMAL LOW (ref 12.0–15.0)
MCH: 27.2 pg (ref 26.0–34.0)
MCHC: 31.3 g/dL (ref 30.0–36.0)
MCV: 87 fL (ref 80.0–100.0)
Platelets: 238 10*3/uL (ref 150–400)
RBC: 3.23 MIL/uL — ABNORMAL LOW (ref 3.87–5.11)
RDW: 17.9 % — ABNORMAL HIGH (ref 11.5–15.5)
WBC: 16.8 10*3/uL — ABNORMAL HIGH (ref 4.0–10.5)
nRBC: 0 % (ref 0.0–0.2)

## 2018-02-12 LAB — BASIC METABOLIC PANEL
Anion gap: 10 (ref 5–15)
BUN: 15 mg/dL (ref 6–20)
CHLORIDE: 99 mmol/L (ref 98–111)
CO2: 26 mmol/L (ref 22–32)
CREATININE: 1.27 mg/dL — AB (ref 0.44–1.00)
Calcium: 8.7 mg/dL — ABNORMAL LOW (ref 8.9–10.3)
GFR calc Af Amer: 59 mL/min — ABNORMAL LOW (ref >60)
GFR calc non Af Amer: 51 mL/min — ABNORMAL LOW (ref >60)
Glucose, Bld: 122 mg/dL — ABNORMAL HIGH (ref 70–99)
Potassium: 3.3 mmol/L — ABNORMAL LOW (ref 3.5–5.1)
Sodium: 135 mmol/L (ref 135–145)

## 2018-02-12 LAB — MAGNESIUM: Magnesium: 2.1 mg/dL (ref 1.7–2.4)

## 2018-02-12 LAB — GLUCOSE, CAPILLARY
Glucose-Capillary: 116 mg/dL — ABNORMAL HIGH (ref 70–99)
Glucose-Capillary: 113 mg/dL — ABNORMAL HIGH (ref 70–99)
Glucose-Capillary: 84 mg/dL (ref 70–99)

## 2018-02-12 MED ORDER — HYDROMORPHONE HCL 1 MG/ML IJ SOLN
0.5000 mg | INTRAMUSCULAR | Status: DC | PRN
  Administered 2018-02-12 – 2018-02-13 (×5): 0.5 mg via INTRAVENOUS
  Filled 2018-02-12 (×5): qty 1

## 2018-02-12 MED ORDER — PREGABALIN 75 MG PO CAPS
200.0000 mg | ORAL_CAPSULE | Freq: Three times a day (TID) | ORAL | Status: DC
  Administered 2018-02-12 – 2018-02-15 (×9): 200 mg via ORAL
  Filled 2018-02-12 (×9): qty 1

## 2018-02-12 MED ORDER — POTASSIUM CHLORIDE CRYS ER 20 MEQ PO TBCR
40.0000 meq | EXTENDED_RELEASE_TABLET | Freq: Once | ORAL | Status: AC
  Administered 2018-02-12: 11:00:00 40 meq via ORAL
  Filled 2018-02-12: qty 2

## 2018-02-12 NOTE — Progress Notes (Signed)
[employer] [facility] - [place] at [facility]   PATIENT NAME: Terri LotDiane Berry    MR#:  [id number]  DATE OF BIRTH:  1975/08/14  SUBJECTIVE:   CHIEF COMPLAINT: Patient's initial complaint was abdominal pain.  While in the emergency room patient noted to have become agitated and delirious with copious nasopharyngeal secretions.  Had to be intubated for airway protection.  Patient was subsequently extubated a few days later in the intensive care unit and transferred to the medical floor.  This morning patient complained of having loose stools this morning.  No fevers.  Abdominal pains improving.   REVIEW OF SYSTEMS:  Review of Systems  Constitutional: Negative for chills and fever.  HENT: Negative for hearing loss and tinnitus.   Eyes: Negative for blurred vision and double vision.  Respiratory: Negative for cough and hemoptysis.   Cardiovascular: Negative for chest pain and palpitations.  Gastrointestinal: Negative for heartburn and nausea.       Abdominal pains improved  Genitourinary: Negative for dysuria and urgency.  Musculoskeletal: Negative for myalgias and neck pain.  Skin: Negative for rash.  Neurological: Negative for dizziness and headaches.  Psychiatric/Behavioral: Negative for depression and hallucinations.     DRUG ALLERGIES:   Allergies  Allergen Reactions  . Precedex [Dexmedetomidine Hcl In Nacl] Other (See Comments)    Significant Bradycardia  . Cefpodoxime     Tolerates augmentin and Keflex   . Clarithromycin   . Doxycycline   . Ferrous Gluconate   . Lactose Intolerance (Gi)   . Levofloxacin   . Rizatriptan   . Sulfa Antibiotics   . Topiramate   . Venlafaxine   . Zofran [Ondansetron Hcl]    VITALS:  Blood pressure (!) 141/101, pulse 79, temperature 97.7 F (36.5 C), temperature source Oral, resp. rate 18, height 5\' 7"  (1.702 m), weight 100.7 kg, SpO2 100 %. PHYSICAL EXAMINATION:   Physical Exam  Constitutional: She is oriented to person,  place, and time. She appears well-developed and well-nourished.  HENT:  Head: Normocephalic and atraumatic.  Eyes: Pupils are equal, round, and reactive to light. Right eye exhibits no discharge.  Neck: Normal range of motion. Neck supple. No thyromegaly present.  Cardiovascular: Normal rate and regular rhythm.  Respiratory: Effort normal and breath sounds normal. No respiratory distress.  GI: Soft. Bowel sounds are normal. She exhibits no distension. There is abdominal tenderness. There is no rebound and no guarding.  Musculoskeletal:        General: No edema.  Neurological: She is alert and oriented to person, place, and time.  Skin: Skin is warm and dry.  Psychiatric: She has a normal mood and affect. Her behavior is normal.   LABORATORY PANEL:  Female CBC Recent Labs  Lab 02/12/18 0426  WBC 16.8*  HGB 8.8*  HCT 28.1*  PLT 238   ------------------------------------------------------------------------------------------------------------------ Chemistries  Recent Labs  Lab 02/09/18 0536  02/12/18 0426  NA 138   < > 135  K 4.7   < > 3.3*  CL 110   < > 99  CO2 18*   < > 26  GLUCOSE 155*   < > 122*  BUN 28*   < > 15  CREATININE 2.05*   < > 1.27*  CALCIUM 8.8*   < > 8.7*  MG  --    < > 2.1  AST 20  --   --   ALT 14  --   --   ALKPHOS 50  --   --  BILITOT 0.3  --   --    < > = values in this interval not displayed.   RADIOLOGY:  No results found. ASSESSMENT AND PLAN:   35F w/ PMHx obesity, T2NIDDM, HTN, HLD, IBS, fibromyalgia, depression, p/w AP/N/V/D, overdose Who initially presented to the emergency room with abdominal pains.  Subsequently documented to have become more confused and delirious.  Was having copious nasopharyngeal secretions from the upper airway and had to be intubated for airway protection.  Subsequently admitted to the intensive care unit.   1.  Acute metabolic encephalopathy Multifactorial.  Patient reported to have become agitated and delirious in  the emergency room.  Noted to have increased secretions with evidence of hypoxic respiratory failure.  Was intubated for airway protection.  Patient was subsequently extubated on 02/10/2018.  Awake and alert and oriented this morning.  2.  Acute kidney injury Renal function gradually improving with IV fluid hydration. Follow-up on renal function in a.m. renal ultrasound done with mild increased renal cortical echogenicity bilaterally consistent with medical renal disease versus acute kidney injury.  No obstructive uropathy.  Avoid nephrotoxic agents.  3.  Diabetes mellitus type 2 Sliding scale insulin coverage for now  4.  Acute pancreatitis  Felt to be secondary to hypertriglyceridemia with triglyceride level of 595 Noted significant improvement in lipase level .   Patient status post previous cholecystectomy.  Currently on clear liquid diet.  There is no wish to have diet advanced today due to some abdominal pains.   PRN IV fentanyl initiated in the ICU discontinued.  Placed on IV Dilaudid 0.5 mg every 4hr as needed.    6.  Leukocytosis.  Leukocytosis continues to improve.   Concern for aspiration. Patient currently on empiric antibiotics with vancomycin with pharmacy to dose.  Also Unasyn.  Follow-up on CBC in a.m. Chest x-ray negative for consolidation. .  Monitor clinically  7.  Debility due to multiple medical problems. Seen by physical therapist.  Recommendation is for skilled nursing facility placement on discharge.  Responding patient refusing placement on discharge.  Prefers to be discharged home on discharge. Physical therapist to reevaluate today.  8.  Diarrhea Started having loose stools this morning.  Noted to be on MiraLAX and senna.  This was discontinued. If diarrhea persist despite discontinuation of laxatives, the request for stool samples for C. difficile.  DVT PPx: Lovenox.  CODE STATUS: Full Code  TOTAL TIME TAKING CARE OF THIS PATIENT: 35 minutes.   More  than 50% of the time was spent in counseling/coordination of care: YES  POSSIBLE D/C IN 2 DAYS, DEPENDING ON CLINICAL CONDITION.   Octavia Velador M.D on 02/12/2018 at 11:58 AM  Between 7am to 6pm - Pager - [phone]  After 6pm go to [email or web address] - Social research officer, governmentpassword EPAS [facility]  [facility] [place] [facility]  Office  [phone]  CC: Primary care physician; Thomes DinningWeeks, Cynthia, MD  Note: This dictation was prepared with Dragon dictation along with smaller phrase technology. Any transcriptional errors that result from this process are unintentional.

## 2018-02-12 NOTE — Progress Notes (Signed)
PT Cancellation Note  Patient Details Name: Terri Berry MRN: [id number] DOB: 07-Mar-1975   Cancelled Treatment:    Reason Eval/Treat Not Completed: Patient declined, no reason specified Pt was sitting up at EOB before lunch on first attempt to see her this AM and she was getting ready to go to the commode.  Attempted to see her later and she was laying in bed, looking uncomfortable, reports that she has had multiple BMs and needs to go again (nursing aware) and she feels that she just is not up to working with PT today given this situation.  Initial PT assessment was limited and pt showed inability to safely go home, appears that she has made significant functional gains and she is eager to show she does not need rehab when she is feeling better and able to participate.   Malachi Pro, DPT 02/12/2018, 4:36 PM

## 2018-02-13 LAB — CBC
HCT: 28.8 % — ABNORMAL LOW (ref 36.0–46.0)
HEMOGLOBIN: 9 g/dL — AB (ref 12.0–15.0)
MCH: 26.8 pg (ref 26.0–34.0)
MCHC: 31.3 g/dL (ref 30.0–36.0)
MCV: 85.7 fL (ref 80.0–100.0)
NRBC: 0 % (ref 0.0–0.2)
Platelets: 284 10*3/uL (ref 150–400)
RBC: 3.36 MIL/uL — ABNORMAL LOW (ref 3.87–5.11)
RDW: 17.3 % — ABNORMAL HIGH (ref 11.5–15.5)
WBC: 14.8 10*3/uL — ABNORMAL HIGH (ref 4.0–10.5)

## 2018-02-13 LAB — GLUCOSE, CAPILLARY
Glucose-Capillary: 101 mg/dL — ABNORMAL HIGH (ref 70–99)
Glucose-Capillary: 129 mg/dL — ABNORMAL HIGH (ref 70–99)
Glucose-Capillary: 120 mg/dL — ABNORMAL HIGH (ref 70–99)
Glucose-Capillary: 109 mg/dL — ABNORMAL HIGH (ref 70–99)

## 2018-02-13 LAB — HEMOGLOBIN A1C
Hgb A1c MFr Bld: 6.1 % — ABNORMAL HIGH (ref 4.8–5.6)
Mean Plasma Glucose: 128.37 mg/dL

## 2018-02-13 LAB — LIPASE, BLOOD: Lipase: 151 U/L — ABNORMAL HIGH (ref 11–51)

## 2018-02-13 LAB — BASIC METABOLIC PANEL
Anion gap: 10 (ref 5–15)
BUN: 13 mg/dL (ref 6–20)
CO2: 29 mmol/L (ref 22–32)
Calcium: 8.9 mg/dL (ref 8.9–10.3)
Chloride: 96 mmol/L — ABNORMAL LOW (ref 98–111)
Creatinine, Ser: 1.05 mg/dL — ABNORMAL HIGH (ref 0.44–1.00)
GFR calc Af Amer: >60 mL/min (ref >60)
GFR calc non Af Amer: >60 mL/min (ref >60)
Glucose, Bld: 114 mg/dL — ABNORMAL HIGH (ref 70–99)
POTASSIUM: 3.4 mmol/L — AB (ref 3.5–5.1)
Sodium: 135 mmol/L (ref 135–145)

## 2018-02-13 LAB — PHOSPHORUS: Phosphorus: 2.1 mg/dL — ABNORMAL LOW (ref 2.5–4.6)

## 2018-02-13 LAB — MAGNESIUM: Magnesium: 1.8 mg/dL (ref 1.7–2.4)

## 2018-02-13 MED ORDER — POTASSIUM CHLORIDE CRYS ER 20 MEQ PO TBCR
40.0000 meq | EXTENDED_RELEASE_TABLET | Freq: Once | ORAL | Status: AC
  Administered 2018-02-13: 40 meq via ORAL
  Filled 2018-02-13: qty 4

## 2018-02-13 MED ORDER — ORAL CARE MOUTH RINSE: 15.0000 mL | Freq: Two times a day (BID) | OROMUCOSAL | Status: DC

## 2018-02-13 MED ORDER — PROCHLORPERAZINE EDISYLATE 10 MG/2ML IJ SOLN
10.0000 mg | Freq: Four times a day (QID) | INTRAMUSCULAR | Status: DC | PRN
  Administered 2018-02-14 – 2018-02-15 (×3): 10 mg via INTRAVENOUS
  Filled 2018-02-13 (×4): qty 2

## 2018-02-13 MED ORDER — CHLORHEXIDINE GLUCONATE 0.12 % MT SOLN
15.0000 mL | Freq: Two times a day (BID) | OROMUCOSAL | Status: DC
  Filled 2018-02-13 (×2): qty 15

## 2018-02-13 NOTE — Clinical Social Work Note (Signed)
CSW met with patient to discuss SNF recommendation. CSW explained that PT has recommended SNF. Patient states that she does not want to go to SNF and would like to return home. CSW notified RNCM of this. CSW signing off. Please re consult if further needs arise.   Dar[place], [place]

## 2018-02-13 NOTE — Progress Notes (Signed)
Physical Therapy Treatment Patient Details Name: Terri Berry MRN: [id number] DOB: 18-May-1975 Today's Date: 02/13/2018    History of Present Illness 43 yo female here with abdominal pain, became altered/hypoxic, underwent intubation for airway protection. PMH of HTN, HLD, IBS, fibromyalgia, depression, s/p cholecystectomy, pancreatitis    PT Comments    Pt was able to ambulate for the first time more than minimal in-room effort.  She was very slow, guarded (abdominal pain) and cautious with 2 bouts of ambulation (60 ft, 30 ft) with considerable reliance on the walker.  She showed good overall effort and though she needed extra time and cuing with all tasks (espeically bed mobility) she did well.  She is very weak with LE exercises and needed AAROM for most tasks, though with great effort (mostly 2/2 pain) she was able to manage some AROM.  Pt needed considerable assist to scoot up in bed, but did most of bed mobility with only light assist and again extra time/effort.  Pt should be safe to go home with HHPT (refusing STR anyway) but remains very weak and far from her baseline.    Follow Up Recommendations        Equipment Recommendations  Rolling walker with 5" wheels    Recommendations for Other Services       Precautions / Restrictions Precautions Precautions: Fall Restrictions Weight Bearing Restrictions: No    Mobility  Bed Mobility Overal bed mobility: Needs Assistance Bed Mobility: Supine to Sit;Sit to Supine     Supine to sit: Min assist;Min guard Sit to supine: Min assist;Min guard   General bed mobility comments: Pt was able to rise to sitting with only light assist.  Very slow and guarded secondary to abdominal pain.  She needed very light assist to get LEs to/off EOB, then getting back into bed needed assist to get square and scooted up in bed.   Transfers Overall transfer level: Modified independent Equipment used: Rolling walker (2 wheeled)              General transfer comment: Again pt very slow and guarded with the effort of rising to standing.  Able to maintain balance with somewhat forward stooped posture and definite use of walker,  Ambulation/Gait Ambulation/Gait assistance: Min guard Gait Distance (Feet): 60 Feet Assistive device: Rolling walker (2 wheeled)       General Gait Details: Pt she able to maintain very slow but steady gait with heavy use of walker and c/o abdominal pain. Pt with some fatigue but on 2 liters O2 her sats remained in the mid 90s.  After seated rest break another bout of ambulation ~30 ft again with labored, but safe execution.     Stairs             Wheelchair Mobility    Modified Rankin (Stroke Patients Only)       Balance Overall balance assessment: Needs assistance Sitting-balance support: No upper extremity supported;Feet unsupported Sitting balance-Leahy Scale: Good     Standing balance support: Bilateral upper extremity supported Standing balance-Leahy Scale: Fair Standing balance comment: heavy use of walker (2/2 pain) but did not have any overt safety/balance issues                            Cognition Arousal/Alertness: Awake/alert Behavior During Therapy: WFL for tasks assessed/performed Overall Cognitive Status: Within Functional Limits for tasks assessed  Exercises General Exercises - Lower Extremity Ankle Circles/Pumps: AROM;10 reps Heel Slides: AAROM;5 reps;Both Hip ABduction/ADduction: AAROM;5 reps;Both Straight Leg Raises: AAROM;5 reps;Both    General Comments        Pertinent Vitals/Pain Pain Assessment: 0-10 Pain Score: 10-Worst pain ever Pain Location: stomach    Home Living                      Prior Function            PT Goals (current goals can now be found in the care plan section) Progress towards PT goals: Progressing toward goals    Frequency    Min  2X/week      PT Plan Discharge plan needs to be updated    Co-evaluation              AM-PAC PT "6 Clicks" Mobility   Outcome Measure  Help needed turning from your back to your side while in a flat bed without using bedrails?: A Little Help needed moving from lying on your back to sitting on the side of a flat bed without using bedrails?: A Little Help needed moving to and from a bed to a chair (including a wheelchair)?: A Little Help needed standing up from a chair using your arms (e.g., wheelchair or bedside chair)?: A Little Help needed to walk in hospital room?: A Little Help needed climbing 3-5 steps with a railing? : A Lot 6 Click Score: 17    End of Session Equipment Utilized During Treatment: Gait belt;Oxygen(2 liters, room air during bed exercises) Activity Tolerance: Patient limited by pain;Patient limited by fatigue Patient left: in bed;with call bell/phone within reach;with bed alarm set Nurse Communication: Mobility status PT Visit Diagnosis: Other abnormalities of gait and mobility (R26.89);Muscle weakness (generalized) (M62.81);Difficulty in walking, not elsewhere classified (R26.2)     Time: 5465-0354 PT Time Calculation (min) (ACUTE ONLY): 62 min  Charges:  $Gait Training: 23-37 mins $Therapeutic Exercise: 8-22 mins $Therapeutic Activity: 8-22 mins                     Malachi Pro, DPT 02/13/2018, 4:09 PM

## 2018-02-13 NOTE — Progress Notes (Addendum)
[employer] [facility] - [place] at [facility]   PATIENT NAME: Terri Berry    MR#:  [id number]  DATE OF BIRTH:  [birth year]  SUBJECTIVE:   CHIEF COMPLAINT: Patient's initial complaint was abdominal pain.  While in the emergency room patient noted to have become agitated and delirious with copious nasopharyngeal secretions.  Had to be intubated for airway protection.  Patient was subsequently extubated a few days later in the intensive care unit and transferred to the medical floor.  Abdominal pain. REVIEW OF SYSTEMS:  Review of Systems  Constitutional: Negative for chills and fever.  HENT: Negative for hearing loss and tinnitus.   Eyes: Negative for blurred vision and double vision.  Respiratory: Negative for cough and hemoptysis.   Cardiovascular: Negative for chest pain and palpitations.  Gastrointestinal: Positive for abdominal pain. Negative for heartburn and nausea.  Genitourinary: Negative for dysuria and urgency.  Musculoskeletal: Negative for myalgias and neck pain.  Skin: Negative for rash.  Neurological: Negative for dizziness and headaches.  Psychiatric/Behavioral: Negative for depression and hallucinations.     DRUG ALLERGIES:   Allergies  Allergen Reactions  . Precedex [Dexmedetomidine Hcl In Nacl] Other (See Comments)    Significant Bradycardia  . Cefpodoxime     Tolerates augmentin and Keflex   . Clarithromycin   . Doxycycline   . Ferrous Gluconate   . Lactose Intolerance (Gi)   . Levofloxacin   . Rizatriptan   . Sulfa Antibiotics   . Topiramate   . Venlafaxine   . Zofran [Ondansetron Hcl]    VITALS:  Blood pressure 131/86, pulse 80, temperature 97.9 F (36.6 C), temperature source Oral, resp. rate 18, height 5\' 7"  (1.702 m), weight 100.7 kg, SpO2 94 %. PHYSICAL EXAMINATION:   Physical Exam  Constitutional: She is oriented to person, place, and time. She appears well-developed and well-nourished.  HENT:  Head: Normocephalic and  atraumatic.  Eyes: Pupils are equal, round, and reactive to light. Right eye exhibits no discharge.  Neck: Normal range of motion. Neck supple. No thyromegaly present.  Cardiovascular: Normal rate and regular rhythm.  Respiratory: Effort normal and breath sounds normal. No respiratory distress.  GI: Soft. Bowel sounds are normal. She exhibits no distension. There is abdominal tenderness. There is no rebound and no guarding.  Musculoskeletal:        General: No edema.  Neurological: She is alert and oriented to person, place, and time.  Skin: Skin is warm and dry.  Psychiatric: She has a normal mood and affect. Her behavior is normal.   LABORATORY PANEL:  Female CBC Recent Labs  Lab 02/13/18 0452  WBC 14.8*  HGB 9.0*  HCT 28.8*  PLT 284   ------------------------------------------------------------------------------------------------------------------ Chemistries  Recent Labs  Lab 02/09/18 0536  02/13/18 0452  NA 138   < > 135  K 4.7   < > 3.4*  CL 110   < > 96*  CO2 18*   < > 29  GLUCOSE 155*   < > 114*  BUN 28*   < > 13  CREATININE 2.05*   < > 1.05*  CALCIUM 8.8*   < > 8.9  MG  --    < > 1.8  AST 20  --   --   ALT 14  --   --   ALKPHOS 50  --   --   BILITOT 0.3  --   --    < > = values in this interval not displayed.   RADIOLOGY:  No results found. ASSESSMENT AND PLAN:   15F w/ PMHx obesity, T2NIDDM, HTN, HLD, IBS, fibromyalgia, depression, p/w AP/N/V/D, overdose Who initially presented to the emergency room with abdominal pains.  Subsequently documented to have become more confused and delirious.  Was having copious nasopharyngeal secretions from the upper airway and had to be intubated for airway protection.  Subsequently admitted to the intensive care unit.   1.  Acute metabolic encephalopathy Multifactorial.  Patient reported to have become agitated and delirious in the emergency room.  Noted to have increased secretions with evidence of hypoxic respiratory  failure.  Was intubated for airway protection.  Patient was subsequently extubated on 02/10/2018.   Improved.  2.  Acute kidney injury Renal function gradually improved with IV fluid hydration. Follow-up on renal function in a.m. renal ultrasound done with mild increased renal cortical echogenicity bilaterally consistent with medical renal disease versus acute kidney injury.  No obstructive uropathy.  Avoid nephrotoxic agents.  3.  Diabetes mellitus type 2 Sliding scale insulin coverage for now   4.  Acute pancreatitis  Felt to be secondary to hypertriglyceridemia with triglyceride level of 595 Noted significant improvement in lipase level .   Patient status post previous cholecystectomy.   PRN IV fentanyl initiated in the ICU discontinued.  Placed on IV Dilaudid 0.5 mg every 4hr as needed.  Lipase decreased to 151. She tolerated clear liquid diet, start full liquid diet.  6.  Leukocytosis.  Leukocytosis continues to improve.   Concern for aspiration. Patient currently on empiric antibiotics with Unasyn.  Vancomycin was discontinued. Follow-up on CBC in a.m. Chest x-ray negative for consolidation.  7.  Debility due to multiple medical problems. Seen by physical therapist.  Recommendation is for skilled nursing facility placement on discharge.  Responding patient refusing placement on discharge.  Prefers to be discharged home on discharge. Ambulate patient.  8.  Diarrhea She was on MiraLAX and senna, which was discontinued. Improved.  Hypokalemia.  Potassium supplement.  Magnesium is normal.  Tobacco abuse.  Smoking cessation was counseled for 3 to 4 minutes.  DVT PPx: Lovenox.  CODE STATUS: Full Code  TOTAL TIME TAKING CARE OF THIS PATIENT: 35 minutes.   More than 50% of the time was spent in counseling/coordination of care: YES  POSSIBLE D/C IN 2 DAYS, DEPENDING ON CLINICAL CONDITION.   Shaune PollackQing Theodis Kinsel M.D on 02/13/2018 at 1:45 PM  Between 7am to 6pm - Pager -  [phone]  After 6pm go to [email or web address] - Social research officer, governmentpassword EPAS [facility]  [facility] [place] [facility]  Office  [phone]  CC: Primary care physician; Thomes DinningWeeks, Cynthia, MD  Note: This dictation was prepared with Dragon dictation along with smaller phrase technology. Any transcriptional errors that result from this process are unintentional.

## 2018-02-13 NOTE — Plan of Care (Signed)
C/o of abdominal pain 10 out of 10, dilaudid given x2 during the shift. Pt asleep on reassessment.  Pt sleeping between care.  Poor appetite.  Ambulated to bathroom with a walker and standby assist.  Currently on 1L O2 per [place].

## 2018-02-14 LAB — CBC
HCT: 30.1 % — ABNORMAL LOW (ref 36.0–46.0)
Hemoglobin: 9.4 g/dL — ABNORMAL LOW (ref 12.0–15.0)
MCH: 27.2 pg (ref 26.0–34.0)
MCHC: 31.2 g/dL (ref 30.0–36.0)
MCV: 87 fL (ref 80.0–100.0)
Platelets: 301 10*3/uL (ref 150–400)
RBC: 3.46 MIL/uL — ABNORMAL LOW (ref 3.87–5.11)
RDW: 17.7 % — AB (ref 11.5–15.5)
WBC: 12.9 10*3/uL — ABNORMAL HIGH (ref 4.0–10.5)
nRBC: 0 % (ref 0.0–0.2)

## 2018-02-14 LAB — BASIC METABOLIC PANEL
Anion gap: 12 (ref 5–15)
BUN: 13 mg/dL (ref 6–20)
CALCIUM: 9.3 mg/dL (ref 8.9–10.3)
CO2: 32 mmol/L (ref 22–32)
Chloride: 94 mmol/L — ABNORMAL LOW (ref 98–111)
Creatinine, Ser: 1.11 mg/dL — ABNORMAL HIGH (ref 0.44–1.00)
GFR calc Af Amer: >60 mL/min (ref >60)
GFR calc non Af Amer: >60 mL/min (ref >60)
Glucose, Bld: 139 mg/dL — ABNORMAL HIGH (ref 70–99)
Potassium: 3.4 mmol/L — ABNORMAL LOW (ref 3.5–5.1)
Sodium: 138 mmol/L (ref 135–145)

## 2018-02-14 LAB — TROPONIN I: Troponin I: <0.03 ng/mL (ref <0.03)

## 2018-02-14 LAB — GLUCOSE, CAPILLARY
Glucose-Capillary: 118 mg/dL — ABNORMAL HIGH (ref 70–99)
Glucose-Capillary: 114 mg/dL — ABNORMAL HIGH (ref 70–99)
Glucose-Capillary: 85 mg/dL (ref 70–99)
Glucose-Capillary: 95 mg/dL (ref 70–99)

## 2018-02-14 LAB — LIPASE, BLOOD: Lipase: 145 U/L — ABNORMAL HIGH (ref 11–51)

## 2018-02-14 MED ORDER — POTASSIUM CHLORIDE CRYS ER 20 MEQ PO TBCR
40.0000 meq | EXTENDED_RELEASE_TABLET | Freq: Once | ORAL | Status: AC
  Administered 2018-02-14: 40 meq via ORAL
  Filled 2018-02-14: qty 2

## 2018-02-14 MED ORDER — NITROGLYCERIN 0.4 MG SL SUBL: 0.4000 mg | SUBLINGUAL_TABLET | SUBLINGUAL | Status: DC | PRN

## 2018-02-14 NOTE — Progress Notes (Addendum)
Overnight pnt complained of chest pain/tightness and R shoulder pain. Vital signs taken which were all WNL, notified Dr. Anne Hahn, ordered stat EKG which was completed and troponin collected.  No abnormalities with any of the interventions performed.Temporarily held meds scheduled for 2200 due to pnts drowsiness. Pnt woke up out of sleep with complaints of chest pain, a few hours beyond when she normally would have taken her Lyrica and Remeron. Gave Lyrica, Remeron, and Oxycodone 5mg  (PO) because the pnt rated pain 10/10. Within an 30-45 of administration of the above meds pnt was sleeping again.   Explained to pnt that she will not go home on Dilaudid so we need to avoid it if at all possible and attempt PO. Dilaudid x's1 and Oxycodone x's 1. Pnt was then content with the one dose of oxycodone she received. No issues otherwise.

## 2018-02-14 NOTE — Progress Notes (Signed)
[facility] - [place] at [facility]   PATIENT NAME: Terri Berry    MR#:  [id number]  DATE OF BIRTH:  06/15/1975  SUBJECTIVE:   CHIEF COMPLAINT: Patient's initial complaint was abdominal pain.  While in the emergency room patient noted to have become agitated and delirious with copious nasopharyngeal secretions.  Had to be intubated for airway protection.  Patient was subsequently extubated a few days later in the intensive care unit and transferred to the medical floor.  The patient complains of body pain all over. REVIEW OF SYSTEMS:  Review of Systems  Constitutional: Negative for chills and fever.  HENT: Negative for hearing loss and tinnitus.   Eyes: Negative for blurred vision and double vision.  Respiratory: Negative for cough and hemoptysis.   Cardiovascular: Negative for chest pain and palpitations.  Gastrointestinal: Positive for abdominal pain. Negative for heartburn and nausea.  Genitourinary: Negative for dysuria and urgency.  Musculoskeletal: Positive for back pain. Negative for myalgias and neck pain.       Shoulder pain, leg pain and arm pain  Skin: Negative for rash.  Neurological: Negative for dizziness and headaches.  Psychiatric/Behavioral: Negative for depression and hallucinations.     DRUG ALLERGIES:   Allergies  Allergen Reactions  . Precedex [Dexmedetomidine Hcl In Nacl] Other (See Comments)    Significant Bradycardia  . Cefpodoxime     Tolerates augmentin and Keflex   . Clarithromycin   . Doxycycline   . Ferrous Gluconate   . Lactose Intolerance (Gi)   . Levofloxacin   . Rizatriptan   . Sulfa Antibiotics   . Topiramate   . Venlafaxine   . Zofran [Ondansetron Hcl]    VITALS:  Blood pressure 112/73, pulse 86, temperature 99.3 F (37.4 C), temperature source Oral, resp. rate 20, height 5\' 7"  (1.702 m), weight 100.7 kg, SpO2 93 %. PHYSICAL EXAMINATION:   Physical Exam  Constitutional: She is oriented to person, place,  and time. She appears well-developed and well-nourished.  HENT:  Head: Normocephalic and atraumatic.  Eyes: Pupils are equal, round, and reactive to light. Right eye exhibits no discharge.  Neck: Normal range of motion. Neck supple. No thyromegaly present.  Cardiovascular: Normal rate and regular rhythm.  Respiratory: Effort normal and breath sounds normal. No respiratory distress.  GI: Soft. Bowel sounds are normal. She exhibits no distension. There is abdominal tenderness. There is no rebound and no guarding.  Musculoskeletal:        General: Tenderness present. No edema.     Comments: Tenderness in right shoulder, arms and legs.  Neurological: She is alert and oriented to person, place, and time.  Skin: Skin is warm and dry.  Psychiatric: She has a normal mood and affect. Her behavior is normal.   LABORATORY PANEL:  Female CBC Recent Labs  Lab 02/14/18 0520  WBC 12.9*  HGB 9.4*  HCT 30.1*  PLT 301   ------------------------------------------------------------------------------------------------------------------ Chemistries  Recent Labs  Lab 02/09/18 0536  02/13/18 0452 02/14/18 0520  NA 138   < > 135 138  K 4.7   < > 3.4* 3.4*  CL 110   < > 96* 94*  CO2 18*   < > 29 32  GLUCOSE 155*   < > 114* 139*  BUN 28*   < > 13 13  CREATININE 2.05*   < > 1.05* 1.11*  CALCIUM 8.8*   < > 8.9 9.3  MG  --    < > 1.8  --  AST 20  --   --   --   ALT 14  --   --   --   ALKPHOS 50  --   --   --   BILITOT 0.3  --   --   --    < > = values in this interval not displayed.   RADIOLOGY:  No results found. ASSESSMENT AND PLAN:   36F w/ PMHx obesity, T2NIDDM, HTN, HLD, IBS, fibromyalgia, depression, p/w AP/N/V/D, overdose Who initially presented to the emergency room with abdominal pains.  Subsequently documented to have become more confused and delirious.  Was having copious nasopharyngeal secretions from the upper airway and had to be intubated for airway protection.  Subsequently  admitted to the intensive care unit.   1.  Acute metabolic encephalopathy Multifactorial.  Patient reported to have become agitated and delirious in the emergency room.  Noted to have increased secretions with evidence of hypoxic respiratory failure.  Was intubated for airway protection.  Patient was subsequently extubated on 02/10/2018.   Improved.  2.  Acute kidney injury Renal function gradually improved with IV fluid hydration. Follow-up on renal function in a.m. renal ultrasound done with mild increased renal cortical echogenicity bilaterally consistent with medical renal disease versus acute kidney injury.  No obstructive uropathy.  Avoid nephrotoxic agents.  3.  Diabetes mellitus type 2 Sliding scale insulin coverage for now   4.  Acute pancreatitis  Felt to be secondary to hypertriglyceridemia with triglyceride level of 595 Noted significant improvement in lipase level .   Patient status post previous cholecystectomy.   PRN IV fentanyl initiated in the ICU discontinued.  Placed on IV Dilaudid 0.5 mg every 4hr as needed.  Lipase decreased to 145. Continue full liquid diet.  6.  Leukocytosis.  Leukocytosis continues to improve.   Concern for aspiration. Patient currently on empiric antibiotics with Unasyn.  Vancomycin was discontinued.  Improving. Chest x-ray negative for consolidation.  7.  Debility due to multiple medical problems. Seen by physical therapist.  Recommendation is for skilled nursing facility placement on discharge.  Responding patient refusing placement on discharge.  Prefers to be discharged home on discharge. Ambulate patient.  8.  Diarrhea She was on MiraLAX and senna, which was discontinued. Improved.  Hypokalemia.  Potassium supplement.  Magnesium is normal.  Tobacco abuse.  Smoking cessation was counseled for 3 to 4 minutes.  Generalized body pain.  Advised the patient avoid narcotics if possible.  DVT PPx: Lovenox.  CODE STATUS: Full  Code  TOTAL TIME TAKING CARE OF THIS PATIENT: 26 minutes.   More than 50% of the time was spent in counseling/coordination of care: YES  POSSIBLE D/C IN 1-2 DAYS, DEPENDING ON CLINICAL CONDITION.   Shaune Pollack M.D on 02/14/2018 at 1:58 PM  Between 7am to 6pm - Pager - [phone]  After 6pm go to [email or web address] - Social research officer, government  [facility]  Office  [phone]  CC: Primary care physician; Thomes Dinning, MD  Note: This dictation was prepared with Dragon dictation along with smaller phrase technology. Any transcriptional errors that result from this process are unintentional.

## 2018-02-15 LAB — GLUCOSE, CAPILLARY: Glucose-Capillary: 125 mg/dL — ABNORMAL HIGH (ref 70–99)

## 2018-02-15 MED ORDER — AMOXICILLIN-POT CLAVULANATE 875-125 MG PO TABS
1.0000 | ORAL_TABLET | Freq: Two times a day (BID) | ORAL | Status: DC
  Administered 2018-02-15: 09:00:00 1 via ORAL
  Filled 2018-02-15: qty 1

## 2018-02-15 MED ORDER — AMOXICILLIN-POT CLAVULANATE 875-125 MG PO TABS: 1.0000 | ORAL_TABLET | Freq: Two times a day (BID) | ORAL | 0 refills | Status: DC

## 2018-02-15 MED ORDER — ALBUTEROL SULFATE HFA 108 (90 BASE) MCG/ACT IN AERS: 2.0000 | INHALATION_SPRAY | Freq: Four times a day (QID) | RESPIRATORY_TRACT | 2 refills | Status: AC | PRN

## 2018-02-15 NOTE — Discharge Summary (Signed)
[facility] - [place] at [facility]   PATIENT NAME: Terri Berry    MR#:  [id number]  DATE OF BIRTH:  [birth year]  DATE OF ADMISSION:  02/09/2018   ADMITTING PHYSICIAN: Barbaraann RondoPrasanna Sridharan, MD  DATE OF DISCHARGE: 02/15/2018 12:35 PM  PRIMARY CARE PHYSICIAN: Thomes DinningWeeks, Cynthia, MD   ADMISSION DIAGNOSIS:  Dehydration [E86.0] AKI (acute kidney injury) (HCC) [N17.9] Acute kidney injury (HCC) [N17.9] Therapeutic misadventure [Y69] Other acute pancreatitis without infection or necrosis [K85.80] DISCHARGE DIAGNOSIS:  Active Problems:   Acute hypoxemic respiratory failure (HCC)  SECONDARY DIAGNOSIS:   Past Medical History:  Diagnosis Date  . Hypertension   . IBS (irritable bowel syndrome)    HOSPITAL COURSE:  42Fw/ PMHxobesity, T2NIDDM, HTN, HLD, IBS, fibromyalgia, depression, p/wAP/N/V/D, overdose Who initially presented to the emergency room with abdominal pains.  Subsequently documented to have become more confused and delirious.  Was having copious nasopharyngeal secretions from the upper airway and had to be intubated for airway protection.  Subsequently admitted to the intensive care unit.   1.  Acute metabolic encephalopathy Multifactorial.  Patient reported to have become agitated and delirious in the emergency room.  Noted to have increased secretions with evidence of hypoxic respiratory failure.  Was intubated for airway protection.  Patient was subsequently extubated on 02/10/2018.   Improved.  2.  Acute kidney injury Renal function gradually improved with IV fluid hydration. Follow-up on renal function in a.m. renal ultrasound done with mild increased renal cortical echogenicity bilaterally consistent with medical renal disease versus acute kidney injury.  No obstructive uropathy.  Avoid nephrotoxic agents.  3.  Diabetes mellitus type 2 Sliding scale insulin coverage for now   4.  Acute pancreatitis  Felt to be secondary to hypertriglyceridemia  with triglyceride level of 595 Noted significant improvement in lipase level .   Patient status post previous cholecystectomy.   PRN IV fentanyl initiated in the ICU discontinued.  Placed on IV Dilaudid 0.5 mg every 4hr as needed.  Lipase decreased to 145. She tolerates diet.  6.  Leukocytosis.  Leukocytosis continues to improve.   Concern for aspiration. Patient currently on empiric antibiotics with Unasyn.  Vancomycin was discontinued.  Improving. Chest x-ray negative for consolidation.  Change to p.o. Augmentin.  7.  Debility due to multiple medical problems. Seen by physical therapist.  Recommendation is for skilled nursing facility placement on discharge.  Responding patient refusing placement on discharge.  Prefers to be discharged home on discharge. Ambulate patient.  8.  Diarrhea She was on MiraLAX and senna, which was discontinued. Improved.  Hypokalemia.  Potassium supplement.  Magnesium is normal.  Tobacco abuse.  Smoking cessation was counseled for 3 to 4 minutes. DISCHARGE CONDITIONS:  Stable, discharged to home with home health. CONSULTS OBTAINED:  Treatment Team:  Barbaraann RondoSridharan, Prasanna, MD DRUG ALLERGIES:   Allergies  Allergen Reactions  . Precedex [Dexmedetomidine Hcl In Nacl] Other (See Comments)    Significant Bradycardia  . Cefpodoxime     Tolerates augmentin and Keflex   . Clarithromycin   . Doxycycline   . Ferrous Gluconate   . Lactose Intolerance (Gi)   . Levofloxacin   . Rizatriptan   . Sulfa Antibiotics   . Topiramate   . Venlafaxine   . Zofran [Ondansetron Hcl]    DISCHARGE MEDICATIONS:   Allergies as of 02/15/2018      Reactions   Precedex [dexmedetomidine Hcl In Nacl] Other (See Comments)   Significant Bradycardia   Cefpodoxime    Tolerates augmentin  and Keflex    Clarithromycin    Doxycycline    Ferrous Gluconate    Lactose Intolerance (gi)    Levofloxacin    Rizatriptan    Sulfa Antibiotics    Topiramate    Venlafaxine     Zofran [ondansetron Hcl]       Medication List    STOP taking these medications   amLODipine 10 MG tablet Commonly known as:  NORVASC   carvedilol 25 MG tablet Commonly known as:  COREG   furosemide 20 MG tablet Commonly known as:  LASIX   lisinopril 20 MG tablet Commonly known as:  PRINIVIL,ZESTRIL     TAKE these medications   albuterol 108 (90 Base) MCG/ACT inhaler Commonly known as:  PROVENTIL HFA;VENTOLIN HFA Inhale 2 puffs into the lungs every 6 (six) hours as needed for wheezing or shortness of breath.   amoxicillin-clavulanate 875-125 MG tablet Commonly known as:  AUGMENTIN Take 1 tablet by mouth every 12 (twelve) hours.   atorvastatin 40 MG tablet Commonly known as:  LIPITOR Take 40 mg by mouth daily.   clonazePAM 1 MG tablet Commonly known as:  KLONOPIN Take 1 mg by mouth 2 (two) times daily as needed for anxiety.   fenofibrate 160 MG tablet Take 160 mg by mouth daily.   fluticasone 50 MCG/ACT nasal spray Commonly known as:  FLONASE Place 2 sprays into both nostrils 2 (two) times daily.   ibuprofen 200 MG tablet Commonly known as:  ADVIL,MOTRIN Take 200 mg by mouth every 6 (six) hours as needed.   mirtazapine 30 MG tablet Commonly known as:  REMERON Take 30 mg by mouth at bedtime.   omeprazole 20 MG capsule Commonly known as:  PRILOSEC Take 40 mg by mouth 2 (two) times daily before a meal.   Pancrelipase (Lip-Prot-Amyl) 24000-76000 units Cpep Take 24,000-48,000 Units by mouth 3 (three) times daily before meals. Take 2 capsules by mouth three times daily with meals and 1 capsule by mouth with snacks.   polyethylene glycol packet Commonly known as:  MIRALAX / GLYCOLAX Take 17 g by mouth 2 (two) times daily.   pregabalin 200 MG capsule Commonly known as:  LYRICA Take 200 mg by mouth 3 (three) times daily.   promethazine 12.5 MG tablet Commonly known as:  PHENERGAN Take 1 tablet (12.5 mg total) by mouth every 6 (six) hours as needed for nausea  or vomiting.   senna-docusate 8.6-50 MG tablet Commonly known as:  Senokot-S Take 2 tablets by mouth 2 (two) times daily.        DISCHARGE INSTRUCTIONS:  See AVS.  If you experience worsening of your admission symptoms, develop shortness of breath, life threatening emergency, suicidal or homicidal thoughts you must seek medical attention immediately by calling 911 or calling your MD immediately  if symptoms less severe.  You Must read complete instructions/literature along with all the possible adverse reactions/side effects for all the Medicines you take and that have been prescribed to you. Take any new Medicines after you have completely understood and accpet all the possible adverse reactions/side effects.   Please note  You were cared for by a hospitalist during your hospital stay. If you have any questions about your discharge medications or the care you received while you were in the hospital after you are discharged, you can call the unit and asked to speak with the hospitalist on call if the hospitalist that took care of you is not available. Once you are discharged, your primary care physician  will handle any further medical issues. Please note that NO REFILLS for any discharge medications will be authorized once you are discharged, as it is imperative that you return to your primary care physician (or establish a relationship with a primary care physician if you do not have one) for your aftercare needs so that they can reassess your need for medications and monitor your lab values.    On the day of Discharge:  VITAL SIGNS:  Blood pressure 109/83, pulse 99, temperature (!) 97.3 F (36.3 C), temperature source Oral, resp. rate 18, height 5\' 7"  (1.702 m), weight 94.4 kg, SpO2 97 %. PHYSICAL EXAMINATION:  GENERAL:  43 y.o.-year-old patient lying in the bed with no acute distress.  Obesity. EYES: Pupils equal, round, reactive to light and accommodation. No scleral icterus.  Extraocular muscles intact.  HEENT: Head atraumatic, normocephalic. Oropharynx and nasopharynx clear.  NECK:  Supple, no jugular venous distention. No thyroid enlargement, no tenderness.  LUNGS: Normal breath sounds bilaterally, no wheezing, rales,rhonchi or crepitation. No use of accessory muscles of respiration.  CARDIOVASCULAR: S1, S2 normal. No murmurs, rubs, or gallops.  ABDOMEN: Soft, non-tender, non-distended. Bowel sounds present. No organomegaly or mass.  EXTREMITIES: No pedal edema, cyanosis, or clubbing.  NEUROLOGIC: Cranial nerves II through XII are intact. Muscle strength 5/5 in all extremities. Sensation intact. Gait not checked.  PSYCHIATRIC: The patient is alert and oriented x 3.  SKIN: No obvious rash, lesion, or ulcer.  DATA REVIEW:   CBC Recent Labs  Lab 02/14/18 0520  WBC 12.9*  HGB 9.4*  HCT 30.1*  PLT 301    Chemistries  Recent Labs  Lab 02/09/18 0536  02/13/18 0452 02/14/18 0520  NA 138   < > 135 138  K 4.7   < > 3.4* 3.4*  CL 110   < > 96* 94*  CO2 18*   < > 29 32  GLUCOSE 155*   < > 114* 139*  BUN 28*   < > 13 13  CREATININE 2.05*   < > 1.05* 1.11*  CALCIUM 8.8*   < > 8.9 9.3  MG  --    < > 1.8  --   AST 20  --   --   --   ALT 14  --   --   --   ALKPHOS 50  --   --   --   BILITOT 0.3  --   --   --    < > = values in this interval not displayed.     Microbiology Results  Results for orders placed or performed during the hospital encounter of 02/09/18  MRSA PCR Screening     Status: None   Collection Time: 02/09/18  9:26 AM  Result Value Ref Range Status   MRSA by PCR NEGATIVE NEGATIVE Final    Comment:        The GeneXpert MRSA Assay (FDA approved for NASAL specimens only), is one component of a comprehensive MRSA colonization surveillance program. It is not intended to diagnose MRSA infection nor to guide or monitor treatment for MRSA infections. Performed at [facility], [address] [place], [place] [address]      RADIOLOGY:  No results found.   Management plans discussed with the patient, family and they are in agreement.  CODE STATUS: Full Code   TOTAL TIME TAKING CARE OF THIS PATIENT: 32 minutes.    Shaune PollackQing Gervase Colberg M.D on 02/15/2018 at 1:19 PM  Between 7am to 6pm -  Pager - [phone]  After 6pm go to [email or web address] - Social research officer, government  [facility]  Office  [phone]  CC: Primary care physician; Thomes Dinning, MD   Note: This dictation was prepared with Dragon dictation along with smaller phrase technology. Any transcriptional errors that result from this process are unintentional.

## 2018-02-15 NOTE — Progress Notes (Signed)
Pt D/C to home with father. PICC line removed intact. VSS. Belongings sent with pt. Education completed. All questions answered.

## 2018-02-15 NOTE — Discharge Instructions (Signed)
Avoid narcotics. Smoking cessation. HHPT

## 2018-02-15 NOTE — Care Management (Signed)
Patient no longer requiring acute O2. Patient receives charity cervices through [facility] [facility] I do not have their contact number and have not had success reaching the appropriate person through the main number line. If there are issues obtaining resumption patient knows to collaborate with PCP to resume charity care.

## 2018-02-26 ENCOUNTER — Ambulatory Visit: Payer: Self-pay

## 2018-03-05 ENCOUNTER — Encounter: Payer: Self-pay | Admitting: Gerontology

## 2018-03-05 ENCOUNTER — Ambulatory Visit: Payer: Self-pay | Admitting: Gerontology

## 2018-03-05 ENCOUNTER — Other Ambulatory Visit: Payer: Self-pay

## 2018-03-05 VITALS — BP 117/82 | HR 74 | Ht 66.0 in | Wt 210.6 lb

## 2018-03-05 DIAGNOSIS — K0889 Other specified disorders of teeth and supporting structures: Secondary | ICD-10-CM

## 2018-03-05 DIAGNOSIS — I10 Essential (primary) hypertension: Secondary | ICD-10-CM

## 2018-03-05 DIAGNOSIS — Z7689 Persons encountering health services in other specified circumstances: Secondary | ICD-10-CM

## 2018-03-05 DIAGNOSIS — Z8659 Personal history of other mental and behavioral disorders: Secondary | ICD-10-CM

## 2018-03-05 DIAGNOSIS — K861 Other chronic pancreatitis: Secondary | ICD-10-CM

## 2018-03-05 DIAGNOSIS — Z8669 Personal history of other diseases of the nervous system and sense organs: Secondary | ICD-10-CM

## 2018-03-05 NOTE — Progress Notes (Signed)
Patient ID: Terri Berry, female   DOB: 1975/12/05, 43 y.o.   MRN: [id number]  Chief Complaint  Patient presents with  . New Patient (Initial Visit)    establish care; pancreatitis     HPI Terri Berry is a 43 y.o. female presents for initial visit. She reports that she was discharged from [facility]  on 12/17/18 and was treated for pancreatitis. She states she has history of chronic pancreatitis and takes creon and fenofibrate daily. She continues to c/o constant 10/10 right upper abdominal pain that radiates to her right mid back. She reports taking 1000 mg tylenol every 6 hours as needed with minimal relief and uses heating pad. She states that she was a patient of Dr Terri Berry at [facility].  She reports that she takes Prozac 40 mg and klonopin 1 mg bid for panic attacks and anxiety and also takes Albuterol for shortness of breath with panic attacks. She reports that her anxiety is stable and has not seen a Psychiatrist in 2 years and requests mental health follow up.  She reports that she takes Lyrica 200 mg tid for Fibromyalgia and was diagnosed at [facility] and medication helps relieve her symptoms.  She reports having persistent nausea and takes Phenergan every 6 hours as needed and Prochlorperazine 10 mg every evening as needed for nausea.  She has has a history of blood pressure and takes lisinopril 20 mg, chlorthalidone 50 mg and amlodipine 10 mg daily, and she doesn't check her blood pressure. She denies chest pain, palpitation, shortness of breath. She requested Dr Terri Berry to prescribe furosemide 20 mg for peripheral edema and she states that she takes it as needed. And requested neomycin powder for yeast underneath her breast.   She reports taking Omeprazole for acid reflux with relief  Currently she reports that an old filling to her lower left tooth came out and a portion of her tooth fell off 4 days ago while she was chewing . She reports  that she's being having tooth ache.  She denies fever nor chills.  She also states that she uses bifocal lenses, but it was broken and she reports that her eyes hurt when she reads and drives. She states that she has not had any ophthalmology exam in 2 years.   Past Medical History:  Diagnosis Date  . Allergy   . Anxiety   . Depression   . GERD (gastroesophageal reflux disease)   . Hypertension   . IBS (irritable bowel syndrome)     Past Surgical History:  Procedure Laterality Date  . ABDOMINAL HYSTERECTOMY    . GALLBLADDER SURGERY      Family History  Problem Relation Age of Onset  . Diabetes Father   . Heart disease Father   . Kidney disease Father   . COPD Father   . Cancer Paternal Aunt   . Diabetes Paternal Uncle   . Heart disease Paternal Uncle     Social History Social History   Tobacco Use  . Smoking status: Current Every Day Smoker  . Smokeless tobacco: Never Used  Substance Use Topics  . Alcohol use: Not Currently  . Drug use: Not Currently    Allergies  Allergen Reactions  . Precedex [Dexmedetomidine Hcl In Nacl] Other (See Comments)    Significant Bradycardia  . Cefpodoxime     Tolerates augmentin and Keflex   . Clarithromycin   . Doxycycline   . Ferrous Gluconate   . Lactose Intolerance (Gi)   . Levofloxacin   .  Rizatriptan   . Sulfa Antibiotics   . Topiramate   . Venlafaxine   . Zofran [Ondansetron Hcl]     Current Outpatient Medications  Medication Sig Dispense Refill  . albuterol (PROVENTIL HFA;VENTOLIN HFA) 108 (90 Base) MCG/ACT inhaler Inhale 2 puffs into the lungs every 6 (six) hours as needed for wheezing or shortness of breath. 1 Inhaler 2  . atorvastatin (LIPITOR) 40 MG tablet Take 40 mg by mouth daily.    . clonazePAM (KLONOPIN) 1 MG tablet Take 1 mg by mouth 2 (two) times daily as needed for anxiety.     . fenofibrate 160 MG tablet Take 160 mg by mouth daily.    Marland Kitchen FLUoxetine (PROZAC) 40 MG capsule Take 40 mg by mouth daily.    . fluticasone (FLONASE) 50 MCG/ACT  nasal spray Place 2 sprays into both nostrils 2 (two) times daily.    . furosemide (LASIX) 20 MG tablet Take by mouth.    Marland Kitchen ibuprofen (ADVIL,MOTRIN) 200 MG tablet Take 200 mg by mouth every 6 (six) hours as needed.    . mirtazapine (REMERON) 30 MG tablet Take 30 mg by mouth at bedtime.    Marland Kitchen nystatin (MYCOSTATIN/NYSTOP) powder Apply topically.    Marland Kitchen omeprazole (PRILOSEC) 20 MG capsule Take 40 mg by mouth 2 (two) times daily before a meal.    . Pancrelipase, Lip-Prot-Amyl, 24000-76000 units CPEP Take 24,000-48,000 Units by mouth 3 (three) times daily before meals. Take 2 capsules by mouth three times daily with meals and 1 capsule by mouth with snacks.    . polyethylene glycol (MIRALAX / GLYCOLAX) packet Take 17 g by mouth 2 (two) times daily.    . pregabalin (LYRICA) 200 MG capsule Take 200 mg by mouth 3 (three) times daily.    . prochlorperazine (COMPAZINE) 10 MG tablet Take by mouth.    . promethazine (PHENERGAN) 12.5 MG tablet Take 1 tablet (12.5 mg total) by mouth every 6 (six) hours as needed for nausea or vomiting. 30 tablet 0  . senna-docusate (SENOKOT-S) 8.6-50 MG tablet Take 2 tablets by mouth 2 (two) times daily. 120 tablet 0   No current facility-administered medications for this visit.     Review of Systems Review of Systems  Constitutional: Negative.   HENT: Negative.   Eyes: Positive for visual disturbance (pain to eyes).  Respiratory: Negative.   Cardiovascular: Positive for leg swelling (peripheral edema).  Gastrointestinal: Positive for abdominal pain (RUQ and middle back) and nausea.  Genitourinary: Negative.   Musculoskeletal: Negative.  Negative for back pain.  Skin: Negative.   Neurological: Positive for light-headedness (with abdominal pain).  Psychiatric/Behavioral: The patient is nervous/anxious (abdominal pain).     Blood pressure 117/82, pulse 74, height 5' 6"  (1.676 m), weight 210 lb 9.6 oz (95.5 kg), SpO2 99 %.  Physical Exam Physical Exam Constitutional:       Appearance: Normal appearance. She is obese.  HENT:     Head: Normocephalic and atraumatic.     Mouth/Throat:     Mouth: Mucous membranes are moist.   Eyes:     Pupils: Pupils are equal, round, and reactive to light.     Comments: She reports pain during EOM  Neck:     Musculoskeletal: Normal range of motion.  Cardiovascular:     Rate and Rhythm: Normal rate and regular rhythm.     Pulses: Normal pulses.     Heart sounds: Normal heart sounds.  Pulmonary:     Effort: Pulmonary effort is normal.  Abdominal:     General: Bowel sounds are normal.     Tenderness: There is abdominal tenderness (palpation to RUQ).  Musculoskeletal: Normal range of motion.     Right lower leg: Edema (trace edema) present.     Left lower leg: Edema (trace edema) present.  Skin:    General: Skin is warm.  Neurological:     General: No focal deficit present.     Mental Status: She is alert and oriented to person, place, and time.  Psychiatric:        Mood and Affect: Mood normal.        Behavior: Behavior normal.        Thought Content: Thought content normal.        Judgment: Judgment normal.     Data Reviewed Medications and past medical history Routine labs ordered.  Assessment and Plan    1. Encounter to establish care - CBC w/Diff - Comp Met (CMET) - TSH - HgB A1c - Lipid panel - Urine Microalbumin w/creat. ratio - Urinalysis - She was advised to elevate legs while sitting and use compression stockings. 2. Tooth ache - Dental referral  3. History of eye pain - Ophthalmology referral  4. Chronic pancreatitis, unspecified pancreatitis type ([place]) - She was advised to take Ibuprofen 200 mg every 6 hours as needed and to go to the nearest ED with worsening pain.  5. Essential hypertension- - She will continue on current treatment regimen, continue on low salt diet  6. Personal history of anxiety disorder - She will follow up with Ms. Simpson H   7. Follow up in 1  month           Milo Schreier E Shilo Pauwels 03/05/2018, 3:00 PM

## 2018-03-10 ENCOUNTER — Ambulatory Visit: Payer: Self-pay | Admitting: Licensed Clinical Social Worker

## 2018-03-10 ENCOUNTER — Other Ambulatory Visit: Payer: Self-pay

## 2018-03-10 DIAGNOSIS — K861 Other chronic pancreatitis: Secondary | ICD-10-CM

## 2018-03-10 DIAGNOSIS — F331 Major depressive disorder, recurrent, moderate: Secondary | ICD-10-CM

## 2018-03-10 DIAGNOSIS — F410 Panic disorder [episodic paroxysmal anxiety] without agoraphobia: Secondary | ICD-10-CM

## 2018-03-10 NOTE — BH Specialist Note (Signed)
Integrated Behavioral Health Comprehensive Clinical Assessment  MRN: [id number] Name: Terri Berry  Type of Service: Integrated Behavioral Health-Individual Interpretor: No. Interpretor Name and Language: Not applicable.   PRESENTING CONCERNS: Terri Berry is a 43 y.o. female accompanied by herself.[facility] mental health by Nurse Practitioner Hurman HornElizabeth Chioma at [facility].  Previous mental health services Have you ever been treated for a mental health problem? Yes If "Yes", when were you treated and whom did you see? [name] was previously [facility] a [facility] two years and then transferred [facility] primary [facility] [place] where she was prescribed Klonopin 1 mg three times a day as needed for panic attacks and Prozac 40 mg daily. She previously saw therapist for years once a week in [place] until she stabilized. She has been on these same psychotropic medications since she was 43 years old until present. Have you ever been hospitalized for mental health treatment? Yes [name] was hospitalized once for approximately one week at [facility] for anxiety and panic attacks. Have you ever been treated for any of the following? Past Psychiatric History/Hospitalization(s): Anxiety: Yes Bipolar Disorder: No Depression: Yes Mania: Negative Psychosis: Negative Schizophrenia: Negative Personality Disorder: Negative Hospitalization for psychiatric illness: Yes History of Electroconvulsive Shock Therapy: Negative Prior Suicide Attempts: Negative Have you ever had thoughts of harming yourself or others or attempted suicide? No plan to harm self or others  Medical history  has a past medical history of Allergy, Anxiety, Depression, GERD (gastroesophageal reflux disease), Hypertension, and IBS (irritable bowel syndrome). Primary Care Physician: Thomes DinningWeeks, Cynthia, MD Date of last physical  exam: a week ago. Allergies:  Allergies  Allergen Reactions  . Precedex [Dexmedetomidine Hcl In Nacl] Other (See Comments)    Significant Bradycardia  . Cefpodoxime     Tolerates augmentin and Keflex   . Clarithromycin   . Doxycycline   . Ferrous Gluconate   . Lactose Intolerance (Gi)   . Levofloxacin   . Rizatriptan   . Sulfa Antibiotics   . Topiramate   . Venlafaxine   . Zofran [Ondansetron Hcl]    Current medications:  Outpatient Encounter Medications as of 03/10/2018  Medication Sig  . albuterol (PROVENTIL HFA;VENTOLIN HFA) 108 (90 Base) MCG/ACT inhaler Inhale 2 puffs into the lungs every 6 (six) hours as needed for wheezing or shortness of breath.  Marland Kitchen. atorvastatin (LIPITOR) 40 MG tablet Take 40 mg by mouth daily.  . clonazePAM (KLONOPIN) 1 MG tablet Take 1 mg by mouth 2 (two) times daily as needed for anxiety.   . fenofibrate 160 MG tablet Take 160 mg by mouth daily.  Marland Kitchen. FLUoxetine (PROZAC) 40 MG capsule Take 40 mg by mouth daily.  . fluticasone (FLONASE) 50 MCG/ACT nasal spray Place 2 sprays into both nostrils 2 (two) times daily.  . furosemide (LASIX) 20 MG tablet Take by mouth.  Marland Kitchen. ibuprofen (ADVIL,MOTRIN) 200 MG tablet Take 200 mg by mouth every 6 (six) hours as needed.  . mirtazapine (REMERON) 30 MG tablet Take 30 mg by mouth at bedtime.  Marland Kitchen. nystatin (MYCOSTATIN/NYSTOP) powder Apply topically.  Marland Kitchen. omeprazole (PRILOSEC) 20 MG capsule Take 40 mg by mouth 2 (two) times daily before a meal.  . Pancrelipase, Lip-Prot-Amyl, 24000-76000 units CPEP Take 24,000-48,000 Units by mouth 3 (three) times daily before meals. Take 2 capsules by mouth three times daily with meals and 1 capsule by mouth with snacks.  . polyethylene  glycol (MIRALAX / GLYCOLAX) packet Take 17 g by mouth 2 (two) times daily.  . pregabalin (LYRICA) 200 MG capsule Take 200 mg by mouth 3 (three) times daily.  . prochlorperazine (COMPAZINE) 10 MG tablet Take by mouth.  . promethazine (PHENERGAN) 12.5 MG tablet Take 1  tablet (12.5 mg total) by mouth every 6 (six) hours as needed for nausea or vomiting.  . senna-docusate (SENOKOT-S) 8.6-50 MG tablet Take 2 tablets by mouth 2 (two) times daily.   No facility-administered encounter medications on file as of 03/10/2018.    Have you ever had any serious medication reactions? No Is there any history of mental health problems or substance abuse in your family? Yes- [name] reports that her mom carries a diagnosis of schizophrenia and lives in a group home in [place]. She reports that her twin sister was diagnosed with Schiozphrenia at the age of 71 and lives in a group home in [place]. She reports that her dad is in the early stages of dementia.  Has anyone in your family been hospitalized for mental health treatment? Yes- [name] reports that her sister was hospitalized mutliple times for mental illness between the age of 34 to present. She reports that she did not find out till she was in her 61's about her mom's diagnosis of Schizophrenia and did not meet her till was in her 32's.  Social/family history Who lives in your current household? [name] lives with her father in a rented house. What is your family of origin, childhood history? [name] was born in [place]. Where were you born? See above. Where did you grow up? [name] grew up in [place] and [place]. Her family moved to [place] in 1993.  How many different homes have you lived in? Ms. Sturm has lived in a handful of homes. Describe your childhood: [name] reports that her childhood was good. She explains that she was happy and close with her siblings. She was raised by her dad and her mom was not in the picture.  Do you have siblings, step/half siblings? Yes- [name] has a twin sister and a brother.  What are their names, relation, sex, age? She did not disclose this information. Are your parents separated or divorced? Yes- [name] reports that she is  not sure if her parents were ever officially married. Her dad did not dislose her mother's mental illness and why she wasn't present in their lives. She did not discover that her mom carries a diagnosis of schizoprehnia till her sister was hospitalized at 47 and made phone calls to extended family members.  What are your social supports? [name] has her family for social support.  Education How many grades have you completed? 8th grade Did you have any problems in school? Yes- Ms. Hieronymus was homeschooled by her father until she was in middle school.  Employment/financial issues 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 She was unable to work after she was diagnosed with panic attacks and anxiety. She has applied for disability.   Sleep Usual bedtime varies. Sleeping arrangements: alone Problems with snoring: No Obstructive sleep apnea is not a concern. Problems with nightmares: No Problems with night terrors: No Problems with sleepwalking: No  Trauma/Abuse history Have you ever experienced or been exposed to any form of abuse? No Have you ever experienced or been exposed to something traumatic?  No  Substance use Do you use alcohol, nicotine or caffeine? no alcohol use How old were you when you first tasted alcohol? [name] denies ever using or abusing alcohol or other drugs.  Have you ever used illicit drugs or abused prescription medications? Negative.   Mental status General appearance/Behavior: Disheveled Eye contact: Good Motor behavior: Normal Speech: Normal Level of consciousness: Alert Mood: Euthymic Affect: Appropriate Anxiety level: Minimal Thought process: Coherent Thought content: WNL Perception: Normal Judgment: Good Insight: Present  Diagnosis No diagnosis found.  GOALS ADDRESSED: Patient will reduce symptoms of: anxiety, depression and stress and increase  knowledge and/or ability of: coping skills, healthy habits and self-management skills and also: Increase healthy adjustment to current life circumstances              INTERVENTIONS: Interventions utilized: Link to [employer] Standardized Assessments completed: GAD-7 and PHQ 9 Her anxiety is at a 15 and her depression is at a 16.  ASSESSMENT/OUTCOME:  Alyeska Boyette is a 43 year old Caucasian female who presents today for a menta health assessment and was referred by Hurman Horn NP at [facility]. [name] reports that she was diagnosed with anxiety and panic attacks at the age of 89. She was hospitalized for approximately one week at [facility] for mental illness at the age of 24. She previously saw a psychiatrist at [facility] for two years and transferred to a primary care doctor at a [facility] clinic in [place], [place]. She explains that she was previously prescribed Xanax 1 mg three times a day as needed for panic attacks and Prozac 40 mg for depression. She explains that her primary care doctor at [facility] decreased her Xanax to 1 mg twice a day and decided to leave the clinic. She previously saw a therapist once a week for years until she stabilized in [facility]. She denies ever being in substance abuse treatment.   [name] is a newly established patient at [facility]. Per the patient, she has a history of pancreatitis where she was hospitalized two weeks ago. She has a history of acute hypoxemic respiratory failure. She has a history of adverse drug reactions to Zofran, Sulfa antibiotics, Topiramate, Venlafaxine, Rizatriptan, Doxycycline, Cefpodoxime, and Precedex.   [name] is unemployed and is applying for disability. She lives with her father in a rented house. She recently moved from [facility] to [facility] She has a twin sister and a brother. 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 She is on food stamps. She relies on her family for support.  Ms. Dishong has a history of mental illness in the family. Both her mom and sister carry a diagnosis of schizophrenia. Her sister has been hospitalized multiple times for mental illness starting at the age of 20 until present. Her sister lives in group home in [place]. Her mom lives in a group home in [place].   PLAN: Case consultation with Dr. Mare Ferrari, MD, Psychiatric Consultant recommended that [name] be referred to [facility] to see a psychiatrist who can continue prescribing her Xanax due to [facility] Policy on not prescribing controlled substances.    Scheduled next visit: No follow up to be scheduled.   Althia Forts Clinical Social Work

## 2018-03-11 LAB — URINALYSIS
Bilirubin, UA: NEGATIVE
Glucose, UA: NEGATIVE
Ketones, UA: NEGATIVE
Leukocytes, UA: NEGATIVE
Nitrite, UA: NEGATIVE
Protein, UA: NEGATIVE
RBC, UA: NEGATIVE
Specific Gravity, UA: 1.018 (ref 1.005–1.030)
Urobilinogen, Ur: 0.2 mg/dL (ref 0.2–1.0)
pH, UA: 5.5 (ref 5.0–7.5)

## 2018-03-11 LAB — CBC WITH DIFFERENTIAL/PLATELET
BASOS: 1 %
Basophils Absolute: 0.1 10*3/uL (ref 0.0–0.2)
EOS (ABSOLUTE): 0.4 10*3/uL (ref 0.0–0.4)
Eos: 4 %
Hematocrit: 31.6 % — ABNORMAL LOW (ref 34.0–46.6)
Hemoglobin: 10.5 g/dL — ABNORMAL LOW (ref 11.1–15.9)
Immature Grans (Abs): 0 10*3/uL (ref 0.0–0.1)
Immature Granulocytes: 0 %
Lymphocytes Absolute: 3.8 10*3/uL — ABNORMAL HIGH (ref 0.7–3.1)
Lymphs: 42 %
MCH: 27 pg (ref 26.6–33.0)
MCHC: 33.2 g/dL (ref 31.5–35.7)
MCV: 81 fL (ref 79–97)
Monocytes Absolute: 0.7 10*3/uL (ref 0.1–0.9)
Monocytes: 7 %
NEUTROS PCT: 46 %
Neutrophils Absolute: 4 10*3/uL (ref 1.4–7.0)
Platelets: 328 10*3/uL (ref 150–450)
RBC: 3.89 x10E6/uL (ref 3.77–5.28)
RDW: 14.1 % (ref 11.7–15.4)
WBC: 9.1 10*3/uL (ref 3.4–10.8)

## 2018-03-11 LAB — COMPREHENSIVE METABOLIC PANEL
ALT: 20 IU/L (ref 0–32)
AST: 19 IU/L (ref 0–40)
Albumin/Globulin Ratio: 1.6 (ref 1.2–2.2)
Albumin: 4.4 g/dL (ref 3.8–4.8)
Alkaline Phosphatase: 65 IU/L (ref 39–117)
BUN/Creatinine Ratio: 11 (ref 9–23)
BUN: 13 mg/dL (ref 6–24)
Bilirubin Total: <0.2 mg/dL (ref 0.0–1.2)
CO2: 23 mmol/L (ref 20–29)
Calcium: 9.1 mg/dL (ref 8.7–10.2)
Chloride: 99 mmol/L (ref 96–106)
Creatinine, Ser: 1.16 mg/dL — ABNORMAL HIGH (ref 0.57–1.00)
GFR calc Af Amer: 66 mL/min/{1.73_m2} (ref >59)
GFR calc non Af Amer: 57 mL/min/{1.73_m2} — ABNORMAL LOW (ref >59)
Globulin, Total: 2.7 g/dL (ref 1.5–4.5)
Glucose: 111 mg/dL — ABNORMAL HIGH (ref 65–99)
Potassium: 3.7 mmol/L (ref 3.5–5.2)
Sodium: 140 mmol/L (ref 134–144)
Total Protein: 7.1 g/dL (ref 6.0–8.5)

## 2018-03-11 LAB — MICROALBUMIN / CREATININE URINE RATIO
Creatinine, Urine: 99.1 mg/dL
Microalb/Creat Ratio: 14 mg/g creat (ref 0–29)
Microalbumin, Urine: 14.1 ug/mL

## 2018-03-11 LAB — HEMOGLOBIN A1C
Est. average glucose Bld gHb Est-mCnc: 128 mg/dL
Hgb A1c MFr Bld: 6.1 % — ABNORMAL HIGH (ref 4.8–5.6)

## 2018-03-11 LAB — LIPID PANEL
CHOLESTEROL TOTAL: 209 mg/dL — AB (ref 100–199)
Chol/HDL Ratio: 8.7 ratio — ABNORMAL HIGH (ref 0.0–4.4)
HDL: 24 mg/dL — ABNORMAL LOW (ref >39)
Triglycerides: 490 mg/dL — ABNORMAL HIGH (ref 0–149)

## 2018-03-11 LAB — TSH: TSH: 0.603 u[IU]/mL (ref 0.450–4.500)

## 2018-03-12 ENCOUNTER — Other Ambulatory Visit: Payer: Self-pay

## 2018-03-12 ENCOUNTER — Ambulatory Visit: Payer: Self-pay

## 2018-03-18 ENCOUNTER — Encounter: Payer: Self-pay | Admitting: Emergency Medicine

## 2018-03-18 ENCOUNTER — Other Ambulatory Visit: Payer: Self-pay

## 2018-03-18 ENCOUNTER — Emergency Department: Payer: Medicaid Other

## 2018-03-18 ENCOUNTER — Inpatient Hospital Stay
Admission: EM | Admit: 2018-03-18 | Discharge: 2018-03-27 | DRG: 438 | Disposition: A | Discharge status: Other Acute Inpatient Hospital | Payer: Medicaid Other | Attending: Internal Medicine | Admitting: Internal Medicine

## 2018-03-18 DIAGNOSIS — E781 Pure hyperglyceridemia: Secondary | ICD-10-CM | POA: Diagnosis present

## 2018-03-18 DIAGNOSIS — K55069 Acute infarction of intestine, part and extent unspecified: Secondary | ICD-10-CM | POA: Diagnosis present

## 2018-03-18 DIAGNOSIS — Z7951 Long term (current) use of inhaled steroids: Secondary | ICD-10-CM

## 2018-03-18 DIAGNOSIS — K760 Fatty (change of) liver, not elsewhere classified: Secondary | ICD-10-CM | POA: Diagnosis present

## 2018-03-18 DIAGNOSIS — Z716 Tobacco abuse counseling: Secondary | ICD-10-CM

## 2018-03-18 DIAGNOSIS — Z9049 Acquired absence of other specified parts of digestive tract: Secondary | ICD-10-CM

## 2018-03-18 DIAGNOSIS — E876 Hypokalemia: Secondary | ICD-10-CM | POA: Diagnosis present

## 2018-03-18 DIAGNOSIS — K219 Gastro-esophageal reflux disease without esophagitis: Secondary | ICD-10-CM | POA: Diagnosis present

## 2018-03-18 DIAGNOSIS — Z881 Allergy status to other antibiotic agents status: Secondary | ICD-10-CM

## 2018-03-18 DIAGNOSIS — I10 Essential (primary) hypertension: Secondary | ICD-10-CM | POA: Diagnosis present

## 2018-03-18 DIAGNOSIS — K859 Acute pancreatitis without necrosis or infection, unspecified: Secondary | ICD-10-CM | POA: Diagnosis present

## 2018-03-18 DIAGNOSIS — Z4659 Encounter for fitting and adjustment of other gastrointestinal appliance and device: Secondary | ICD-10-CM

## 2018-03-18 DIAGNOSIS — Z0189 Encounter for other specified special examinations: Secondary | ICD-10-CM

## 2018-03-18 DIAGNOSIS — M797 Fibromyalgia: Secondary | ICD-10-CM | POA: Diagnosis present

## 2018-03-18 DIAGNOSIS — F329 Major depressive disorder, single episode, unspecified: Secondary | ICD-10-CM | POA: Diagnosis present

## 2018-03-18 DIAGNOSIS — E119 Type 2 diabetes mellitus without complications: Secondary | ICD-10-CM | POA: Diagnosis present

## 2018-03-18 DIAGNOSIS — Z90710 Acquired absence of both cervix and uterus: Secondary | ICD-10-CM

## 2018-03-18 DIAGNOSIS — J9811 Atelectasis: Secondary | ICD-10-CM | POA: Diagnosis present

## 2018-03-18 DIAGNOSIS — Z7141 Alcohol abuse counseling and surveillance of alcoholic: Secondary | ICD-10-CM

## 2018-03-18 DIAGNOSIS — J9601 Acute respiratory failure with hypoxia: Secondary | ICD-10-CM | POA: Diagnosis not present

## 2018-03-18 DIAGNOSIS — K567 Ileus, unspecified: Secondary | ICD-10-CM | POA: Diagnosis not present

## 2018-03-18 DIAGNOSIS — F17210 Nicotine dependence, cigarettes, uncomplicated: Secondary | ICD-10-CM | POA: Diagnosis present

## 2018-03-18 DIAGNOSIS — J90 Pleural effusion, not elsewhere classified: Secondary | ICD-10-CM | POA: Diagnosis present

## 2018-03-18 DIAGNOSIS — K589 Irritable bowel syndrome without diarrhea: Secondary | ICD-10-CM | POA: Diagnosis present

## 2018-03-18 DIAGNOSIS — E860 Dehydration: Secondary | ICD-10-CM | POA: Diagnosis present

## 2018-03-18 DIAGNOSIS — R140 Abdominal distension (gaseous): Secondary | ICD-10-CM

## 2018-03-18 DIAGNOSIS — R109 Unspecified abdominal pain: Secondary | ICD-10-CM

## 2018-03-18 DIAGNOSIS — F419 Anxiety disorder, unspecified: Secondary | ICD-10-CM | POA: Diagnosis present

## 2018-03-18 DIAGNOSIS — D72829 Elevated white blood cell count, unspecified: Secondary | ICD-10-CM

## 2018-03-18 DIAGNOSIS — N179 Acute kidney failure, unspecified: Secondary | ICD-10-CM | POA: Diagnosis not present

## 2018-03-18 DIAGNOSIS — Z8249 Family history of ischemic heart disease and other diseases of the circulatory system: Secondary | ICD-10-CM

## 2018-03-18 DIAGNOSIS — K863 Pseudocyst of pancreas: Secondary | ICD-10-CM | POA: Diagnosis present

## 2018-03-18 DIAGNOSIS — R0602 Shortness of breath: Secondary | ICD-10-CM

## 2018-03-18 DIAGNOSIS — K861 Other chronic pancreatitis: Secondary | ICD-10-CM | POA: Diagnosis present

## 2018-03-18 DIAGNOSIS — Z79899 Other long term (current) drug therapy: Secondary | ICD-10-CM

## 2018-03-18 DIAGNOSIS — Z888 Allergy status to other drugs, medicaments and biological substances status: Secondary | ICD-10-CM

## 2018-03-18 DIAGNOSIS — G894 Chronic pain syndrome: Secondary | ICD-10-CM | POA: Diagnosis present

## 2018-03-18 DIAGNOSIS — Z91011 Allergy to milk products: Secondary | ICD-10-CM

## 2018-03-18 DIAGNOSIS — Z882 Allergy status to sulfonamides status: Secondary | ICD-10-CM

## 2018-03-18 DIAGNOSIS — E872 Acidosis: Secondary | ICD-10-CM | POA: Diagnosis present

## 2018-03-18 DIAGNOSIS — J302 Other seasonal allergic rhinitis: Secondary | ICD-10-CM | POA: Diagnosis present

## 2018-03-18 LAB — CBC
HCT: 44.4 % (ref 36.0–46.0)
Hemoglobin: 13.6 g/dL (ref 12.0–15.0)
MCH: 26 pg (ref 26.0–34.0)
MCHC: 30.6 g/dL (ref 30.0–36.0)
MCV: 84.7 fL (ref 80.0–100.0)
Platelets: 466 10*3/uL — ABNORMAL HIGH (ref 150–400)
RBC: 5.24 MIL/uL — AB (ref 3.87–5.11)
RDW: 14.1 % (ref 11.5–15.5)
WBC: 23.6 10*3/uL — ABNORMAL HIGH (ref 4.0–10.5)
nRBC: 0 % (ref 0.0–0.2)

## 2018-03-18 LAB — TRIGLYCERIDES: Triglycerides: 552 mg/dL — ABNORMAL HIGH (ref <150)

## 2018-03-18 LAB — COMPREHENSIVE METABOLIC PANEL
ALT: 36 U/L (ref 0–44)
AST: 86 U/L — AB (ref 15–41)
Albumin: 4.2 g/dL (ref 3.5–5.0)
Alkaline Phosphatase: 61 U/L (ref 38–126)
Anion gap: 14 (ref 5–15)
BUN: 14 mg/dL (ref 6–20)
CO2: 24 mmol/L (ref 22–32)
Calcium: 9.2 mg/dL (ref 8.9–10.3)
Chloride: 97 mmol/L — ABNORMAL LOW (ref 98–111)
Creatinine, Ser: 0.98 mg/dL (ref 0.44–1.00)
GFR calc Af Amer: >60 mL/min (ref >60)
Glucose, Bld: 187 mg/dL — ABNORMAL HIGH (ref 70–99)
Potassium: 3.2 mmol/L — ABNORMAL LOW (ref 3.5–5.1)
Sodium: 135 mmol/L (ref 135–145)
Total Bilirubin: 0.8 mg/dL (ref 0.3–1.2)
Total Protein: 8.3 g/dL — ABNORMAL HIGH (ref 6.5–8.1)

## 2018-03-18 LAB — LIPASE, BLOOD: Lipase: 1044 U/L — ABNORMAL HIGH (ref 11–51)

## 2018-03-18 MED ORDER — MORPHINE SULFATE 15 MG PO TABS
15.0000 mg | ORAL_TABLET | Freq: Four times a day (QID) | ORAL | Status: DC | PRN
  Administered 2018-03-18 – 2018-03-19 (×2): 15 mg via ORAL
  Filled 2018-03-18 (×2): qty 1

## 2018-03-18 MED ORDER — POTASSIUM CHLORIDE 10 MEQ/100ML IV SOLN
10.0000 meq | INTRAVENOUS | Status: AC
  Administered 2018-03-18 (×2): 10 meq via INTRAVENOUS
  Filled 2018-03-18: qty 100

## 2018-03-18 MED ORDER — POLYETHYLENE GLYCOL 3350 17 G PO PACK: 17.0000 g | PACK | Freq: Two times a day (BID) | ORAL | Status: DC

## 2018-03-18 MED ORDER — SODIUM CHLORIDE 0.9% FLUSH
3.0000 mL | Freq: Two times a day (BID) | INTRAVENOUS | Status: DC
  Administered 2018-03-18 – 2018-03-27 (×16): 3 mL via INTRAVENOUS

## 2018-03-18 MED ORDER — POLYETHYLENE GLYCOL 3350 17 G PO PACK: 17.0000 g | PACK | Freq: Every day | ORAL | Status: DC | PRN

## 2018-03-18 MED ORDER — FLUTICASONE PROPIONATE 50 MCG/ACT NA SUSP
2.0000 | Freq: Two times a day (BID) | NASAL | Status: DC
  Administered 2018-03-18 – 2018-03-27 (×9): 2 via NASAL
  Filled 2018-03-18 (×3): qty 16

## 2018-03-18 MED ORDER — FENOFIBRATE 160 MG PO TABS
160.0000 mg | ORAL_TABLET | Freq: Every day | ORAL | Status: DC
  Administered 2018-03-18 – 2018-03-26 (×5): 160 mg via ORAL
  Filled 2018-03-18 (×12): qty 1

## 2018-03-18 MED ORDER — SODIUM CHLORIDE 0.9 % IV BOLUS
500.0000 mL | Freq: Once | INTRAVENOUS | Status: AC
  Administered 2018-03-18: 500 mL via INTRAVENOUS

## 2018-03-18 MED ORDER — HYDROCODONE-ACETAMINOPHEN 5-325 MG PO TABS: 1.0000 | ORAL_TABLET | ORAL | Status: DC | PRN

## 2018-03-18 MED ORDER — PREGABALIN 50 MG PO CAPS
200.0000 mg | ORAL_CAPSULE | Freq: Three times a day (TID) | ORAL | Status: DC
  Administered 2018-03-18: 200 mg via ORAL
  Filled 2018-03-18: qty 4

## 2018-03-18 MED ORDER — ACETAMINOPHEN 650 MG RE SUPP: 650.0000 mg | Freq: Four times a day (QID) | RECTAL | Status: DC | PRN

## 2018-03-18 MED ORDER — ALBUTEROL SULFATE (2.5 MG/3ML) 0.083% IN NEBU
2.5000 mg | INHALATION_SOLUTION | RESPIRATORY_TRACT | Status: DC | PRN
  Administered 2018-03-26: 2.5 mg via RESPIRATORY_TRACT
  Filled 2018-03-18: qty 3

## 2018-03-18 MED ORDER — ENOXAPARIN SODIUM 40 MG/0.4ML ~~LOC~~ SOLN
40.0000 mg | SUBCUTANEOUS | Status: DC
  Administered 2018-03-19: 40 mg via SUBCUTANEOUS
  Filled 2018-03-18 (×2): qty 0.4

## 2018-03-18 MED ORDER — CLONAZEPAM 1 MG PO TABS
1.0000 mg | ORAL_TABLET | Freq: Two times a day (BID) | ORAL | Status: DC | PRN
  Administered 2018-03-18 – 2018-03-21 (×3): 1 mg via ORAL
  Filled 2018-03-18: qty 1
  Filled 2018-03-18: qty 2
  Filled 2018-03-18: qty 1

## 2018-03-18 MED ORDER — SODIUM CHLORIDE 0.9 % IV BOLUS: 1000.0000 mL | Freq: Once | INTRAVENOUS | Status: DC

## 2018-03-18 MED ORDER — PROMETHAZINE HCL 25 MG/ML IJ SOLN
12.5000 mg | Freq: Four times a day (QID) | INTRAMUSCULAR | Status: DC | PRN
  Administered 2018-03-18 – 2018-03-27 (×19): 12.5 mg via INTRAVENOUS
  Filled 2018-03-18 (×21): qty 1

## 2018-03-18 MED ORDER — KETOROLAC TROMETHAMINE 30 MG/ML IJ SOLN
15.0000 mg | Freq: Once | INTRAMUSCULAR | Status: AC
  Administered 2018-03-18: 15 mg via INTRAVENOUS
  Filled 2018-03-18: qty 1

## 2018-03-18 MED ORDER — MIRTAZAPINE 15 MG PO TABS
30.0000 mg | ORAL_TABLET | Freq: Every day | ORAL | Status: DC
  Administered 2018-03-18: 22:00:00 30 mg via ORAL
  Filled 2018-03-18: qty 2

## 2018-03-18 MED ORDER — ATORVASTATIN CALCIUM 20 MG PO TABS
40.0000 mg | ORAL_TABLET | Freq: Every day | ORAL | Status: DC
  Administered 2018-03-18 – 2018-03-25 (×5): 40 mg via ORAL
  Filled 2018-03-18 (×7): qty 2

## 2018-03-18 MED ORDER — MORPHINE SULFATE (PF) 2 MG/ML IV SOLN
2.0000 mg | INTRAVENOUS | Status: DC | PRN
  Administered 2018-03-19 – 2018-03-20 (×4): 2 mg via INTRAVENOUS
  Filled 2018-03-18 (×4): qty 1

## 2018-03-18 MED ORDER — FLUOXETINE HCL 20 MG PO CAPS
40.0000 mg | ORAL_CAPSULE | Freq: Every day | ORAL | Status: DC
  Administered 2018-03-18 – 2018-03-26 (×5): 40 mg via ORAL
  Filled 2018-03-18 (×12): qty 2

## 2018-03-18 MED ORDER — IOHEXOL 300 MG/ML  SOLN
100.0000 mL | Freq: Once | INTRAMUSCULAR | Status: AC | PRN
  Administered 2018-03-18: 100 mL via INTRAVENOUS

## 2018-03-18 MED ORDER — MORPHINE SULFATE (PF) 4 MG/ML IV SOLN
4.0000 mg | INTRAVENOUS | Status: DC | PRN
  Administered 2018-03-18: 4 mg via INTRAVENOUS
  Filled 2018-03-18: qty 1

## 2018-03-18 MED ORDER — ACETAMINOPHEN 325 MG PO TABS
650.0000 mg | ORAL_TABLET | Freq: Four times a day (QID) | ORAL | Status: DC | PRN
  Administered 2018-03-18 – 2018-03-25 (×5): 650 mg via ORAL
  Filled 2018-03-18 (×5): qty 2

## 2018-03-18 MED ORDER — METOCLOPRAMIDE HCL 5 MG/ML IJ SOLN
10.0000 mg | Freq: Four times a day (QID) | INTRAMUSCULAR | Status: DC | PRN
  Administered 2018-03-20 – 2018-03-25 (×13): 10 mg via INTRAVENOUS
  Filled 2018-03-18 (×14): qty 2

## 2018-03-18 MED ORDER — SENNOSIDES-DOCUSATE SODIUM 8.6-50 MG PO TABS
2.0000 | ORAL_TABLET | Freq: Two times a day (BID) | ORAL | Status: DC
  Administered 2018-03-18 – 2018-03-26 (×8): 2 via ORAL
  Filled 2018-03-18 (×10): qty 2

## 2018-03-18 MED ORDER — POTASSIUM CHLORIDE 10 MEQ/100ML IV SOLN
10.0000 meq | INTRAVENOUS | Status: AC
  Filled 2018-03-18: qty 100

## 2018-03-18 MED ORDER — HALOPERIDOL LACTATE 5 MG/ML IJ SOLN
5.0000 mg | Freq: Once | INTRAMUSCULAR | Status: AC
  Administered 2018-03-18: 5 mg via INTRAVENOUS
  Filled 2018-03-18: qty 1

## 2018-03-18 MED ORDER — PANTOPRAZOLE SODIUM 40 MG PO TBEC
40.0000 mg | DELAYED_RELEASE_TABLET | Freq: Every day | ORAL | Status: DC
  Administered 2018-03-18 – 2018-03-26 (×6): 40 mg via ORAL
  Filled 2018-03-18 (×7): qty 1

## 2018-03-18 MED ORDER — POTASSIUM CHLORIDE IN NACL 20-0.9 MEQ/L-% IV SOLN
INTRAVENOUS | Status: DC
  Administered 2018-03-18 – 2018-03-19 (×2): via INTRAVENOUS
  Filled 2018-03-18 (×7): qty 1000

## 2018-03-18 NOTE — ED Notes (Signed)
Patient complaining of dry mouth. Informed she could not have anything to drink until CT scan resulted. Patient given swabs to wipe mouth out with.

## 2018-03-18 NOTE — H&P (Signed)
[employer] - [place] at [facility]   PATIENT NAME: Terri Berry    MR#:  [id number]  DATE OF BIRTH:  January 08, 1976  DATE OF ADMISSION:  03/18/2018  PRIMARY CARE PHYSICIAN: Thomes Dinning, MD   REQUESTING/REFERRING PHYSICIAN: Dr. Roxan Hockey  CHIEF COMPLAINT:   Chief Complaint  Patient presents with  . Abdominal Pain    HISTORY OF PRESENT ILLNESS:  Terri Berry  is a 43 y.o. female with a known history of hypertension, chronic pain, depression, hypertriglyceridemia, history of pancreatitis presents to the emergency room complaining of worsening chronic abdominal pain and vomiting.  Patient has chronic abdominal pain which is significantly worse over the last 2 days.  Multiple episodes of vomiting.  Her dad brought her to the emergency room.  Here her lipase has been found to be 1044.  CT scan of the abdomen and pelvis shows diffuse inflammation of the pancreas with no pseudocyst.  Patient has history of cholecystectomy.  At this time patient is drowsy and in distress with the pain.  Unable to give history.  History obtained from father at bedside and reviewing old records and discussing with ED staff.  PAST MEDICAL HISTORY:   Past Medical History:  Diagnosis Date  . Allergy   . Anxiety   . Depression   . GERD (gastroesophageal reflux disease)   . Hypertension   . IBS (irritable bowel syndrome)     PAST SURGICAL HISTORY:   Past Surgical History:  Procedure Laterality Date  . ABDOMINAL HYSTERECTOMY    . GALLBLADDER SURGERY      SOCIAL HISTORY:   Social History   Tobacco Use  . Smoking status: Current Every Day Smoker    Packs/day: 0.50    Types: Cigarettes  . Smokeless tobacco: Never Used  Substance Use Topics  . Alcohol use: Not Currently    FAMILY HISTORY:   Family History  Problem Relation Age of Onset  . Diabetes Father   . Heart disease Father   . Kidney disease Father   . COPD Father   . Cancer Paternal Aunt   . Diabetes Paternal Uncle    . Heart disease Paternal Uncle     DRUG ALLERGIES:   Allergies  Allergen Reactions  . Precedex [Dexmedetomidine Hcl In Nacl] Other (See Comments)    Significant Bradycardia  . Cefpodoxime     Tolerates augmentin and Keflex   . Clarithromycin   . Doxycycline   . Ferrous Gluconate   . Lactose Intolerance (Gi)   . Levofloxacin   . Rizatriptan   . Sulfa Antibiotics   . Topiramate   . Venlafaxine   . Zofran [Ondansetron Hcl]     REVIEW OF SYSTEMS:   Review of Systems  Unable to perform ROS: Mental status change    MEDICATIONS AT HOME:   Prior to Admission medications   Medication Sig Start Date End Date Taking? Authorizing Provider  albuterol (PROVENTIL HFA;VENTOLIN HFA) 108 (90 Base) MCG/ACT inhaler Inhale 2 puffs into the lungs every 6 (six) hours as needed for wheezing or shortness of breath. 02/15/18  Yes Shaune Pollack, MD  atorvastatin (LIPITOR) 40 MG tablet Take 40 mg by mouth daily.   Yes [provider]  fenofibrate 160 MG tablet Take 160 mg by mouth daily. 12/25/17 04/07/18 Yes [provider]  FLUoxetine (PROZAC) 40 MG capsule Take 40 mg by mouth daily.   Yes [provider]  fluticasone (FLONASE) 50 MCG/ACT nasal spray Place 2 sprays into both nostrils 2 (two) times  daily.   Yes [provider]  furosemide (LASIX) 20 MG tablet Take 20 mg by mouth daily.  03/05/18 03/05/19 Yes [provider]  ibuprofen (ADVIL,MOTRIN) 200 MG tablet Take 200 mg by mouth every 6 (six) hours as needed.   Yes [provider]  mirtazapine (REMERON) 30 MG tablet Take 30 mg by mouth at bedtime.   Yes [provider]  morphine (MSIR) 15 MG tablet Take 15 mg by mouth every 6 (six) hours as needed. 03/15/18 03/20/18 Yes [provider]  nystatin (MYCOSTATIN/NYSTOP) powder Apply topically 2 (two) times daily.  03/05/18 03/20/18 Yes [provider]  omeprazole (PRILOSEC) 20 MG capsule Take 40 mg by mouth 2 (two) times daily  before a meal.   Yes [provider]  Pancrelipase, Lip-Prot-Amyl, 24000-76000 units CPEP Take 24,000-48,000 Units by mouth 3 (three) times daily before meals. Take 2 capsules by mouth three times daily with meals and 1 capsule by mouth with snacks.   Yes [provider]  polyethylene glycol (MIRALAX / GLYCOLAX) packet Take 17 g by mouth 2 (two) times daily.   Yes [provider]  pregabalin (LYRICA) 200 MG capsule Take 200 mg by mouth 3 (three) times daily.   Yes [provider]  prochlorperazine (COMPAZINE) 10 MG tablet Take 10 mg by mouth every 6 (six) hours as needed.  02/18/18  Yes [provider]  promethazine (PHENERGAN) 12.5 MG tablet Take 1 tablet (12.5 mg total) by mouth every 6 (six) hours as needed for nausea or vomiting. 12/04/17  Yes Mayo, Allyn Kenner, MD  senna-docusate (SENOKOT-S) 8.6-50 MG tablet Take 2 tablets by mouth 2 (two) times daily. Patient taking differently: Take 4 tablets by mouth at bedtime.  12/04/17  Yes Mayo, Allyn Kenner, MD  clonazePAM (KLONOPIN) 1 MG tablet Take 1 mg by mouth 2 (two) times daily as needed for anxiety.     [provider]     VITAL SIGNS:  Blood pressure (!) 135/101, pulse (!) 114, temperature 98.2 F (36.8 C), temperature source Oral, resp. rate 18, height 5\' 7"  (1.702 m), weight 97.5 kg, SpO2 94 %.  PHYSICAL EXAMINATION:  Physical Exam  GENERAL:  43 y.o.-year-old patient lying in the bed with acute distress due to pain EYES: Pupils equal, round, reactive to light and accommodation. No scleral icterus. Extraocular muscles intact.  HEENT: Head atraumatic, normocephalic. Oropharynx and nasopharynx clear. No oropharyngeal erythema, moist oral mucosa  NECK:  Supple, no jugular venous distention. No thyroid enlargement, no tenderness.  LUNGS: Normal breath sounds bilaterally, no wheezing, rales, rhonchi. No use of accessory muscles of respiration.  CARDIOVASCULAR: S1, S2 normal. No murmurs, rubs, or  gallops.  ABDOMEN: Soft, diffuse tenderness, nondistended. Bowel sounds absent. No organomegaly or mass.  EXTREMITIES: No pedal edema, cyanosis, or clubbing. + 2 pedal & radial pulses b/l.   NEUROLOGIC: Cranial nerves II through XII are intact. No focal Motor or sensory deficits appreciated b/l PSYCHIATRIC: The patient is alert and oriented x 3. Good affect.  SKIN: No obvious rash, lesion, or ulcer.   LABORATORY PANEL:   CBC Recent Labs  Lab 03/18/18 1140  WBC 23.6*  HGB 13.6  HCT 44.4  PLT 466*   ------------------------------------------------------------------------------------------------------------------  Chemistries  Recent Labs  Lab 03/18/18 1140  NA 135  K 3.2*  CL 97*  CO2 24  GLUCOSE 187*  BUN 14  CREATININE 0.98  CALCIUM 9.2  AST 86*  ALT 36  ALKPHOS 61  BILITOT 0.8   ------------------------------------------------------------------------------------------------------------------  Cardiac Enzymes No results for input(s): TROPONINI in the last 168 hours. ------------------------------------------------------------------------------------------------------------------  RADIOLOGY:  Ct Abdomen Pelvis W Contrast  Result Date: 03/18/2018 CLINICAL DATA:  Abdominal pain with vomiting. History of pancreatitis. EXAM: CT ABDOMEN AND PELVIS WITH CONTRAST TECHNIQUE: Multidetector CT imaging of the abdomen and pelvis was performed using the standard protocol following bolus administration of intravenous contrast. CONTRAST:  100mL OMNIPAQUE IOHEXOL 300 MG/ML  SOLN COMPARISON:  Abdominopelvic CT 02/10/2018 and 02/09/2018. FINDINGS: Lower chest: Bibasilar atelectasis has mildly improved. There is no significant pleural or pericardial effusion. Hepatobiliary: The hepatic density is within normal limits and no focal lesion identified. No significant biliary dilatation post cholecystectomy. Pancreas: The pancreas remains diffusely enlarged, although appears homogeneous without  evidence of necrosis or acute hemorrhage. There is increasing peripancreatic inflammation and ill-defined fluid. No pancreatic ductal dilatation. Spleen: Normal in size without focal abnormality. Adrenals/Urinary Tract: Both adrenal glands appear normal. There are tiny cysts within the upper poles of both kidneys. No evidence of renal mass, urinary tract calculus or hydronephrosis. The bladder appears normal. Stomach/Bowel: No evidence of bowel wall thickening, distention or surrounding inflammatory change. The appendix appears normal. Moderate stool in the proximal colon. Vascular/Lymphatic: There are no enlarged abdominal or pelvic lymph nodes. Stable 12 mm portacaval node on image 35/2, likely reactive. The superior mesenteric and splenic veins are attenuated by the pancreatic inflammatory changes, although remain patent. No evidence of large vessel occlusion. Reproductive: Hysterectomy.  No evidence of adnexal mass. Other: Volume of pelvic ascites has not significantly increased. As above, there is slightly greater inflammatory change and ill-defined fluid surrounding the pancreas and extending into both pericolic gutters. No focal fluid collections are identified. Musculoskeletal: No acute or significant osseous findings. Severe discogenic sclerosis at L4-5. IMPRESSION: 1. Slight progression of extensive peripancreatic inflammatory changes with ill-defined fluid. No evidence of pancreatic hemorrhage, necrosis or focal fluid collection. 2. Attenuation of the splenic and superior mesenteric veins without evidence of large vessel occlusion. 3. Improved bibasilar atelectasis. Electronically Signed   By: Carey BullocksWilliam  Veazey M.D.   On: 03/18/2018 12:59     IMPRESSION AND PLAN:   *Acute on chronic pancreatitis.  Etiology unclear.  Patient does have hypertriglyceridemia but levels have been in the 100s in the past.  Will repeat.  Does not drink alcohol.  History of cholecystectomy.  At this time treat  symptomatically.  N.p.o.  Pain medications as needed nausea medications.  We will bolus normal saline now and continue maintenance fluids.  Repeat lipase in the morning. Patient does not have any bowel sounds at this time.  CT scan does not show ileus or bowel obstruction but raises concern.  N.p.o. at this time.  Follow closely.  Patient takes laxatives at home which have continued.  *Hypertension.  Continue home medications.  Likely elevated due to pain.  *Hypokalemia.  Replace through IV.  *Chronic pain syndrome.  Continue patient's morphine from home.  IV morphine added for severe pain.  *DVT prophylaxis with Lovenox  All the records are reviewed and case discussed with ED provider. Management plans discussed with the patient, family and they are in agreement.  CODE STATUS: Full code  TOTAL TIME TAKING CARE OF THIS PATIENT: 40 minutes.   Molinda BailiffSrikar R Miron Marxen M.D on 03/18/2018 at 2:25 PM  Between 7am to 6pm - Pager - [phone]  After 6pm go to [email or web address] - password [email or web address] [facility]  [facility] [place] [facility]  Office  [phone]  CC: Primary care physician; Thomes DinningWeeks, Cynthia, MD  Note: This  dictation was prepared with Dragon dictation along with smaller phrase technology. Any transcriptional errors that result from this process are unintentional.

## 2018-03-18 NOTE — ED Provider Notes (Signed)
[facility] Emergency Department Provider Note    First MD Initiated Contact with Patient 03/18/18 1153     (approximate)  I have reviewed the triage vital signs and the nursing notes.   HISTORY  Chief Complaint Abdominal Pain    HPI Terri Berry is a 43 y.o. female below listed past medical history as well as well-known history of pancreatitis and chronic abdominal pain presents to the ER for severe severe worsening of her abdominal pain nausea vomiting chills as well.  Is not been able to keep anything down for the past day or so.  Feels severely dehydrated.  Has been having chills.  Denies any chest pain or shortness of breath.  [facility].  Is not able to keep any medications down at home.    Past Medical History:  Diagnosis Date  . Allergy   . Anxiety   . Depression   . GERD (gastroesophageal reflux disease)   . Hypertension   . IBS (irritable bowel syndrome)    Family History  Problem Relation Age of Onset  . Diabetes Father   . Heart disease Father   . Kidney disease Father   . COPD Father   . Cancer Paternal Aunt   . Diabetes Paternal Uncle   . Heart disease Paternal Uncle    Past Surgical History:  Procedure Laterality Date  . ABDOMINAL HYSTERECTOMY    . GALLBLADDER SURGERY     Patient Active Problem List   Diagnosis Date Noted  . Acute pancreatitis 03/18/2018  . Acute hypoxemic respiratory failure (HCC) 02/09/2018  . Overdose 11/28/2017      Prior to Admission medications   Medication Sig Start Date End Date Taking? Authorizing Provider  albuterol (PROVENTIL HFA;VENTOLIN HFA) 108 (90 Base) MCG/ACT inhaler Inhale 2 puffs into the lungs every 6 (six) hours as needed for wheezing or shortness of breath. 02/15/18  Yes Shaune Pollackhen, Qing, MD  atorvastatin (LIPITOR) 40 MG tablet Take 40 mg by mouth daily.   Yes [provider]  fenofibrate 160 MG tablet Take 160 mg by mouth  daily. 12/25/17 04/07/18 Yes [provider]  FLUoxetine (PROZAC) 40 MG capsule Take 40 mg by mouth daily.   Yes [provider]  fluticasone (FLONASE) 50 MCG/ACT nasal spray Place 2 sprays into both nostrils 2 (two) times daily.   Yes [provider]  furosemide (LASIX) 20 MG tablet Take 20 mg by mouth daily.  03/05/18 03/05/19 Yes [provider]  ibuprofen (ADVIL,MOTRIN) 200 MG tablet Take 200 mg by mouth every 6 (six) hours as needed.   Yes [provider]  mirtazapine (REMERON) 30 MG tablet Take 30 mg by mouth at bedtime.   Yes [provider]  morphine (MSIR) 15 MG tablet Take 15 mg by mouth every 6 (six) hours as needed. 03/15/18 03/20/18 Yes [provider]  nystatin (MYCOSTATIN/NYSTOP) powder Apply topically 2 (two) times daily.  03/05/18 03/20/18 Yes [provider]  omeprazole (PRILOSEC) 20 MG capsule Take 40 mg by mouth 2 (two) times daily before a meal.   Yes [provider]  Pancrelipase, Lip-Prot-Amyl, 24000-76000 units CPEP Take 24,000-48,000 Units by mouth 3 (three) times daily before meals. Take 2 capsules by mouth three times daily with meals and 1 capsule by mouth with snacks.   Yes [provider]  polyethylene glycol (MIRALAX / GLYCOLAX) packet Take 17 g by mouth 2 (two) times daily.   Yes [provider]  pregabalin (LYRICA) 200 MG capsule Take 200 mg by mouth 3 (three) times daily.   Yes [provider]  prochlorperazine (COMPAZINE) 10 MG tablet Take 10 mg by mouth every 6 (six) hours as needed.  02/18/18  Yes [provider]  promethazine (PHENERGAN) 12.5 MG tablet Take 1 tablet (12.5 mg total) by mouth every 6 (six) hours as needed for nausea or vomiting. 12/04/17  Yes Mayo, Allyn Kenner, MD  senna-docusate (SENOKOT-S) 8.6-50 MG tablet Take 2 tablets by mouth 2 (two) times daily. Patient taking differently: Take 4 tablets by mouth at bedtime.  12/04/17  Yes Mayo, Allyn Kenner,  MD  clonazePAM (KLONOPIN) 1 MG tablet Take 1 mg by mouth 2 (two) times daily as needed for anxiety.     [provider]    Allergies Precedex [dexmedetomidine hcl in nacl]; Cefpodoxime; Clarithromycin; Doxycycline; Ferrous gluconate; Lactose intolerance (gi); Levofloxacin; Rizatriptan; Sulfa antibiotics; Topiramate; Venlafaxine; and Zofran Frazier Richards hcl]    Social History Social History   Tobacco Use  . Smoking status: Current Every Day Smoker    Packs/day: 0.50    Types: Cigarettes  . Smokeless tobacco: Never Used  Substance Use Topics  . Alcohol use: Not Currently  . Drug use: Not Currently    Review of Systems Patient denies headaches, rhinorrhea, blurry vision, numbness, shortness of breath, chest pain, edema, cough, abdominal pain, nausea, vomiting, diarrhea, dysuria, fevers, rashes or hallucinations unless otherwise stated above in HPI. ____________________________________________   PHYSICAL EXAM:  VITAL SIGNS: Vitals:   03/18/18 1430 03/18/18 1509  BP: (!) 137/100 (!) 132/95  Pulse: (!) 119 (!) 114  Resp: 18 20  Temp:  97.6 F (36.4 C)  SpO2: 94% 92%    Constitutional: Alert and oriented. Chronically ill appearing, dehydrated Eyes: Conjunctivae are normal.  Head: Atraumatic. Nose: No congestion/rhinnorhea. Mouth/Throat: Mucous membranes are dry  Neck: No stridor. Painless ROM.  Cardiovascular: Normal rate, regular rhythm. Grossly normal heart sounds.  Good peripheral circulation. Respiratory: Normal respiratory effort.  No retractions. Lungs CTAB. Gastrointestinal: Soft , obese, diffusely ttp No distention. No abdominal bruits. No CVA tenderness. Genitourinary: deferred Musculoskeletal: No lower extremity tenderness nor edema.  No joint effusions. Neurologic:  Normal speech and language. No gross focal neurologic deficits are appreciated. No facial droop Skin:  Skin is warm, dry and intact. No rash noted. Psychiatric: Mood and affect are  anxious. Speech and behavior are normal.  ____________________________________________   LABS (all labs ordered are listed, but only abnormal results are displayed)  Results for orders placed or performed during the hospital encounter of 03/18/18 (from the past 24 hour(s))  Lipase, blood     Status: Abnormal   Collection Time: 03/18/18 11:40 AM  Result Value Ref Range   Lipase 1,044 (H) 11 - 51 U/L  Comprehensive metabolic panel     Status: Abnormal   Collection Time: 03/18/18 11:40 AM  Result Value Ref Range   Sodium 135 135 - 145 mmol/L   Potassium 3.2 (L) 3.5 - 5.1 mmol/L   Chloride 97 (L) 98 - 111 mmol/L   CO2 24 22 - 32 mmol/L   Glucose, Bld 187 (H) 70 - 99 mg/dL   BUN 14 6 - 20 mg/dL   Creatinine, Ser 3.01 0.44 - 1.00 mg/dL   Calcium 9.2 8.9 - 60.1 mg/dL   Total Protein 8.3 (H) 6.5 - 8.1 g/dL   Albumin 4.2 3.5 - 5.0 g/dL   AST 86 (H) 15 - 41 U/L   ALT 36 0 -  44 U/L   Alkaline Phosphatase 61 38 - 126 U/L   Total Bilirubin 0.8 0.3 - 1.2 mg/dL   GFR calc non Af Amer >60 >60 mL/min   GFR calc Af Amer >60 >60 mL/min   Anion gap 14 5 - 15  CBC     Status: Abnormal   Collection Time: 03/18/18 11:40 AM  Result Value Ref Range   WBC 23.6 (H) 4.0 - 10.5 K/uL   RBC 5.24 (H) 3.87 - 5.11 MIL/uL   Hemoglobin 13.6 12.0 - 15.0 g/dL   HCT 21.9 75.8 - 83.2 %   MCV 84.7 80.0 - 100.0 fL   MCH 26.0 26.0 - 34.0 pg   MCHC 30.6 30.0 - 36.0 g/dL   RDW 54.9 82.6 - 41.5 %   Platelets 466 (H) 150 - 400 K/uL   nRBC 0.0 0.0 - 0.2 %  Triglycerides     Status: Abnormal   Collection Time: 03/18/18 11:40 AM  Result Value Ref Range   Triglycerides 552 (H) <150 mg/dL   ____________________________________________   ____________________________________________  RADIOLOGY  I personally reviewed all radiographic images ordered to evaluate for the above acute complaints and reviewed radiology reports and findings.  These findings were personally discussed with the patient.  Please see medical  record for radiology report.  ____________________________________________   PROCEDURES  Procedure(s) performed:  Procedures    Critical Care performed: no ____________________________________________   INITIAL IMPRESSION / ASSESSMENT AND PLAN / ED COURSE  Pertinent labs & imaging results that were available during my care of the patient were reviewed by me and considered in my medical decision making (see chart for details).   DDX: pancreatitis, dehydration, colitis, diverticulitis, perforation  Solanch Richoux is a 43 y.o. who presents to the ED with history and presentation as listed above.  Bladder does show elevation of lipase.  Will order CT imaging given her acute pain to exclude other pathology she is reporting more left-sided pain.  She did have discomfort that want to rule out perforation or other acute surgical emergency.  Will provide IV fluids as well as IV pain medication.  Clinical Course as of Mar 18 1598  Wed Mar 18, 2018  1341 CT imaging does show evidence of acute pancreatitis.  Given her persistent pain elevated lipase, white count and severe discomfort do believe patient would benefit from hospitalization for IV fluids, pain control and bowel rest.   [PR]    Clinical Course User Index [PR] Willy Eddy, MD     As part of my medical decision making, I reviewed the following data within the electronic MEDICAL RECORD NUMBER Nursing notes reviewed and incorporated, Labs reviewed, notes from prior ED visits and [place] Controlled Substance Database   ____________________________________________   FINAL CLINICAL IMPRESSION(S) / ED DIAGNOSES  Final diagnoses:  Acute pancreatitis without infection or necrosis, unspecified pancreatitis type      NEW MEDICATIONS STARTED DURING THIS VISIT:  Current Discharge Medication List       Note:  This document was prepared using Dragon voice recognition software and may include unintentional dictation errors.      Willy Eddy, MD 03/18/18 1600

## 2018-03-18 NOTE — Progress Notes (Signed)
Advance care planning  Purpose of Encounter Pancreatitis, code status  Parties in Attendance Patient and fatther at bedside  Patients Decisional capacity Patient drowsy with pain medications and distress.  Unable to make medical decisions.  Discussed with patient's father at bedside who is the next of kin and make medical decisions at this time.  We discussed regarding patient's acute illness and need for inpatient treatment.  All questions answered.  During prior admissions patient had to be intubated due to encephalopathy.  Presently further requests aggressive care with full scope of treatment.  Orders entered  FULL CODE  Time spent - 17 minutes

## 2018-03-18 NOTE — ED Triage Notes (Addendum)
Pt in via [facility] with complaints abdominal pain, emesis x 2.  Pt with recent diagnosis Pancreatitis per [facility].  Fentanyl given per EMS.  Pt A/Ox4 upon arrival, NAD noted at this time.

## 2018-03-18 NOTE — ED Notes (Signed)
Pt resting in bed with eyes closed, rise and fall of chest noted. NAD.

## 2018-03-18 NOTE — ED Notes (Signed)
ED TO INPATIENT HANDOFF REPORT  ED Nurse Name and Phone #:  Vernona RiegerLaura [phone]  Name/Age/Gender Terri Berry 43 y.o. female Room/Bed: ED05A/ED05A  Code Status   Code Status: Full Code  Home/SNF/Other home A&O x4 Is this baseline? yes  Triage Complete: Triage complete  Chief Complaint abd pain  Triage Note Pt in via [facility] with complaints abdominal pain, emesis x 2.  [facility].  75mcg Fentanyl given per EMS.  Pt A/Ox4 upon arrival, NAD noted at this time.   Allergies Allergies  Allergen Reactions  . Precedex [Dexmedetomidine Hcl In Nacl] Other (See Comments)    Significant Bradycardia  . Cefpodoxime     Tolerates augmentin and Keflex   . Clarithromycin   . Doxycycline   . Ferrous Gluconate   . Lactose Intolerance (Gi)   . Levofloxacin   . Rizatriptan   . Sulfa Antibiotics   . Topiramate   . Venlafaxine   . Zofran [Ondansetron Hcl]     Level of Care/Admitting Diagnosis ED Disposition    ED Disposition Condition Comment   Admit  [facility] [[id number]]  Level of Care: Med-Surg [16]  Diagnosis: Acute pancreatitis [577.0.ICD-9-CM]  Admitting Physician: Milagros LollSUDINI, SRIKAR [[id number]]  Attending Physician: Milagros LollSUDINI, SRIKAR [[id number]]  Estimated length of stay: past midnight tomorrow  Certification:: I certify this patient will need inpatient services for at least 2 midnights  PT Class (Do Not Modify): Inpatient [101]  PT Acc Code (Do Not Modify): Private [1]       Medical/Surgery History Past Medical History:  Diagnosis Date  . Allergy   . Anxiety   . Depression   . GERD (gastroesophageal reflux disease)   . Hypertension   . IBS (irritable bowel syndrome)    Past Surgical History:  Procedure Laterality Date  . ABDOMINAL HYSTERECTOMY    . GALLBLADDER SURGERY       IV Location/Drains/Wounds Patient Lines/Drains/Airways Status   Active Line/Drains/Airways    Name:   Placement date:   Placement time:    Site:   Days:   Peripheral IV 03/18/18 Right Wrist   03/18/18    1140    Wrist   less than 1          Intake/Output Last 24 hours  Intake/Output Summary (Last 24 hours) at 03/18/2018 1428 Last data filed at 03/18/2018 1336 Gross per 24 hour  Intake 500 ml  Output -  Net 500 ml    Labs/Imaging Results for orders placed or performed during the hospital encounter of 03/18/18 (from the past 48 hour(s))  Lipase, blood     Status: Abnormal   Collection Time: 03/18/18 11:40 AM  Result Value Ref Range   Lipase 1,044 (H) 11 - 51 U/L    Comment: RESULT CONFIRMED BY MANUAL DILUTION Lyda Kalata/HKP Performed at [facility], [address] [place], [place] [address]   Comprehensive metabolic panel     Status: Abnormal   Collection Time: 03/18/18 11:40 AM  Result Value Ref Range   Sodium 135 135 - 145 mmol/L   Potassium 3.2 (L) 3.5 - 5.1 mmol/L   Chloride 97 (L) 98 - 111 mmol/L   CO2 24 22 - 32 mmol/L   Glucose, Bld 187 (H) 70 - 99 mg/dL   BUN 14 6 - 20 mg/dL   Creatinine, Ser 8.460.98 0.44 - 1.00 mg/dL   Calcium 9.2 8.9 - 96.210.3 mg/dL   Total Protein 8.3 (H) 6.5 - 8.1 g/dL  Albumin 4.2 3.5 - 5.0 g/dL   AST 86 (H) 15 - 41 U/L   ALT 36 0 - 44 U/L   Alkaline Phosphatase 61 38 - 126 U/L   Total Bilirubin 0.8 0.3 - 1.2 mg/dL   GFR calc non Af Amer >60 >60 mL/min   GFR calc Af Amer >60 >60 mL/min   Anion gap 14 5 - 15    Comment: Performed at [facility], [address] [place], [place] [address]  CBC     Status: Abnormal   Collection Time: 03/18/18 11:40 AM  Result Value Ref Range   WBC 23.6 (H) 4.0 - 10.5 K/uL   RBC 5.24 (H) 3.87 - 5.11 MIL/uL   Hemoglobin 13.6 12.0 - 15.0 g/dL   HCT 02.744.4 25.336.0 - 66.446.0 %   MCV 84.7 80.0 - 100.0 fL   MCH 26.0 26.0 - 34.0 pg   MCHC 30.6 30.0 - 36.0 g/dL   RDW 40.314.1 47.411.5 - 25.915.5 %   Platelets 466 (H) 150 - 400 K/uL   nRBC 0.0 0.0 - 0.2 %    Comment: Performed at [facility], [address] [place], [place] [address]   Triglycerides     Status: Abnormal   Collection Time: 03/18/18 11:40 AM  Result Value Ref Range   Triglycerides 552 (H) <150 mg/dL    Comment: Performed at [facility], [address] [place], [place] [address]   Ct Abdomen Pelvis W Contrast  Result Date: 03/18/2018 CLINICAL DATA:  Abdominal pain with vomiting. History of pancreatitis. EXAM: CT ABDOMEN AND PELVIS WITH CONTRAST TECHNIQUE: Multidetector CT imaging of the abdomen and pelvis was performed using the standard protocol following bolus administration of intravenous contrast. CONTRAST:  100mL OMNIPAQUE IOHEXOL 300 MG/ML  SOLN COMPARISON:  Abdominopelvic CT 02/10/2018 and 02/09/2018. FINDINGS: Lower chest: Bibasilar atelectasis has mildly improved. There is no significant pleural or pericardial effusion. Hepatobiliary: The hepatic density is within normal limits and no focal lesion identified. No significant biliary dilatation post cholecystectomy. Pancreas: The pancreas remains diffusely enlarged, although appears homogeneous without evidence of necrosis or acute hemorrhage. There is increasing peripancreatic inflammation and ill-defined fluid. No pancreatic ductal dilatation. Spleen: Normal in size without focal abnormality. Adrenals/Urinary Tract: Both adrenal glands appear normal. There are tiny cysts within the upper poles of both kidneys. No evidence of renal mass, urinary tract calculus or hydronephrosis. The bladder appears normal. Stomach/Bowel: No evidence of bowel wall thickening, distention or surrounding inflammatory change. The appendix appears normal. Moderate stool in the proximal colon. Vascular/Lymphatic: There are no enlarged abdominal or pelvic lymph nodes. Stable 12 mm portacaval node on image 35/2, likely reactive. The superior mesenteric and splenic veins are attenuated by the pancreatic inflammatory changes, although remain patent. No evidence of large vessel occlusion. Reproductive: Hysterectomy.  No evidence of  adnexal mass. Other: Volume of pelvic ascites has not significantly increased. As above, there is slightly greater inflammatory change and ill-defined fluid surrounding the pancreas and extending into both pericolic gutters. No focal fluid collections are identified. Musculoskeletal: No acute or significant osseous findings. Severe discogenic sclerosis at L4-5. IMPRESSION: 1. Slight progression of extensive peripancreatic inflammatory changes with ill-defined fluid. No evidence of pancreatic hemorrhage, necrosis or focal fluid collection. 2. Attenuation of the splenic and superior mesenteric veins without evidence of large vessel occlusion. 3. Improved bibasilar atelectasis. Electronically Signed   By: Carey BullocksWilliam  Veazey M.D.   On: 03/18/2018 12:59    Pending Labs Unresulted Labs (From admission, onward)  Start     Ordered   03/25/18 0500  Creatinine, serum  (enoxaparin (LOVENOX)    CrCl >/= 30 ml/min)  Weekly,   STAT    Comments:  while on enoxaparin therapy    03/18/18 1409   03/19/18 0500  Basic metabolic panel  Tomorrow morning,   STAT     03/18/18 1409   03/19/18 0500  CBC  Tomorrow morning,   STAT     03/18/18 1409   03/19/18 0500  Lipase, blood  Tomorrow morning,   STAT     03/18/18 1416   03/18/18 1416  Urine Drug Screen, Qualitative ([facility] only)  Add-on,   AD     03/18/18 1415   03/18/18 1134  Urinalysis, Complete w Microscopic  ONCE - STAT,   STAT     03/18/18 1134          Vitals/Pain Today's Vitals   03/18/18 1348 03/18/18 1355 03/18/18 1415 03/18/18 1422  BP:   (!) 149/104 (!) 135/101  Pulse:   (!) 114 (!) 114  Resp:   18   Temp:      TempSrc:      SpO2:   95% 94%  Weight:      Height:      PainSc: 10-Worst pain ever Asleep      Isolation Precautions No active isolations  Medications Medications  promethazine (PHENERGAN) injection 12.5 mg (12.5 mg Intravenous Given 03/18/18 1233)  sodium chloride 0.9 % bolus 1,000 mL (has no administration in time range)   enoxaparin (LOVENOX) injection 40 mg (has no administration in time range)  sodium chloride flush (NS) 0.9 % injection 3 mL (has no administration in time range)  acetaminophen (TYLENOL) tablet 650 mg (has no administration in time range)    Or  acetaminophen (TYLENOL) suppository 650 mg (has no administration in time range)  0.9 % NaCl with KCl 20 mEq/ L  infusion (has no administration in time range)  polyethylene glycol (MIRALAX / GLYCOLAX) packet 17 g (has no administration in time range)  albuterol (PROVENTIL) (2.5 MG/3ML) 0.083% nebulizer solution 2.5 mg (has no administration in time range)  morphine 2 MG/ML injection 2 mg (has no administration in time range)  potassium chloride 10 mEq in 100 mL IVPB (has no administration in time range)  metoCLOPramide (REGLAN) injection 10 mg (has no administration in time range)  sodium chloride 0.9 % bolus 500 mL (0 mLs Intravenous Stopped 03/18/18 1336)  haloperidol lactate (HALDOL) injection 5 mg (5 mg Intravenous Given 03/18/18 1232)  iohexol (OMNIPAQUE) 300 MG/ML solution 100 mL (100 mLs Intravenous Contrast Given 03/18/18 1220)  ketorolac (TORADOL) 30 MG/ML injection 15 mg (15 mg Intravenous Given 03/18/18 1349)    Mobility walks with person assist Low fall risk   Focused Assessments Dx with pancreatitis at [facility] this week. Here for worsening pain and emesis   Recommendations: See Admitting Provider Note  Report given to:   Additional Notes:

## 2018-03-19 ENCOUNTER — Inpatient Hospital Stay: Payer: Medicaid Other

## 2018-03-19 ENCOUNTER — Ambulatory Visit: Payer: Self-pay | Admitting: Ophthalmology

## 2018-03-19 DIAGNOSIS — E781 Pure hyperglyceridemia: Secondary | ICD-10-CM

## 2018-03-19 DIAGNOSIS — Z881 Allergy status to other antibiotic agents status: Secondary | ICD-10-CM

## 2018-03-19 DIAGNOSIS — Z888 Allergy status to other drugs, medicaments and biological substances status: Secondary | ICD-10-CM

## 2018-03-19 DIAGNOSIS — K859 Acute pancreatitis without necrosis or infection, unspecified: Principal | ICD-10-CM

## 2018-03-19 DIAGNOSIS — R509 Fever, unspecified: Secondary | ICD-10-CM

## 2018-03-19 DIAGNOSIS — K567 Ileus, unspecified: Secondary | ICD-10-CM

## 2018-03-19 DIAGNOSIS — E739 Lactose intolerance, unspecified: Secondary | ICD-10-CM

## 2018-03-19 DIAGNOSIS — R0603 Acute respiratory distress: Secondary | ICD-10-CM

## 2018-03-19 DIAGNOSIS — R140 Abdominal distension (gaseous): Secondary | ICD-10-CM

## 2018-03-19 DIAGNOSIS — Z8719 Personal history of other diseases of the digestive system: Secondary | ICD-10-CM

## 2018-03-19 DIAGNOSIS — K5900 Constipation, unspecified: Secondary | ICD-10-CM

## 2018-03-19 DIAGNOSIS — K8500 Idiopathic acute pancreatitis without necrosis or infection: Secondary | ICD-10-CM

## 2018-03-19 DIAGNOSIS — D72829 Elevated white blood cell count, unspecified: Secondary | ICD-10-CM

## 2018-03-19 DIAGNOSIS — N179 Acute kidney failure, unspecified: Secondary | ICD-10-CM

## 2018-03-19 DIAGNOSIS — J90 Pleural effusion, not elsewhere classified: Secondary | ICD-10-CM

## 2018-03-19 DIAGNOSIS — J9811 Atelectasis: Secondary | ICD-10-CM

## 2018-03-19 DIAGNOSIS — R0602 Shortness of breath: Secondary | ICD-10-CM

## 2018-03-19 DIAGNOSIS — K861 Other chronic pancreatitis: Secondary | ICD-10-CM

## 2018-03-19 DIAGNOSIS — F329 Major depressive disorder, single episode, unspecified: Secondary | ICD-10-CM

## 2018-03-19 LAB — URINALYSIS, COMPLETE (UACMP) WITH MICROSCOPIC
BACTERIA UA: NONE SEEN
BILIRUBIN URINE: NEGATIVE
Bacteria, UA: NONE SEEN
Bilirubin Urine: NEGATIVE
GLUCOSE, UA: NEGATIVE mg/dL
Glucose, UA: NEGATIVE mg/dL
Hgb urine dipstick: NEGATIVE
Hgb urine dipstick: NEGATIVE
KETONES UR: NEGATIVE mg/dL
Ketones, ur: NEGATIVE mg/dL
Leukocytes,Ua: NEGATIVE
Leukocytes,Ua: NEGATIVE
NITRITE: NEGATIVE
Nitrite: NEGATIVE
Protein, ur: NEGATIVE mg/dL
Protein, ur: NEGATIVE mg/dL
Specific Gravity, Urine: 1.021 (ref 1.005–1.030)
Specific Gravity, Urine: 1.023 (ref 1.005–1.030)
WBC, UA: NONE SEEN WBC/hpf (ref 0–5)
pH: 5 (ref 5.0–8.0)
pH: 5 (ref 5.0–8.0)

## 2018-03-19 LAB — BLOOD GAS, ARTERIAL
Acid-base deficit: 5.6 mmol/L — ABNORMAL HIGH (ref 0.0–2.0)
Bicarbonate: 19.3 mmol/L — ABNORMAL LOW (ref 20.0–28.0)
FIO2: 0.36
O2 SAT: 90.9 %
PATIENT TEMPERATURE: 39
pCO2 arterial: 35 mmHg (ref 32.0–48.0)
pH, Arterial: 7.32 — ABNORMAL LOW (ref 7.350–7.450)
pO2, Arterial: 73 mmHg — ABNORMAL LOW (ref 83.0–108.0)

## 2018-03-19 LAB — TROPONIN I
Troponin I: 0.07 ng/mL (ref <0.03)
Troponin I: 0.06 ng/mL (ref <0.03)
Troponin I: 0.06 ng/mL (ref <0.03)

## 2018-03-19 LAB — BASIC METABOLIC PANEL
Anion gap: 14 (ref 5–15)
BUN: 24 mg/dL — ABNORMAL HIGH (ref 6–20)
CO2: 21 mmol/L — ABNORMAL LOW (ref 22–32)
Calcium: 8.6 mg/dL — ABNORMAL LOW (ref 8.9–10.3)
Chloride: 103 mmol/L (ref 98–111)
Creatinine, Ser: 2.07 mg/dL — ABNORMAL HIGH (ref 0.44–1.00)
GFR calc Af Amer: 33 mL/min — ABNORMAL LOW (ref >60)
GFR calc non Af Amer: 28 mL/min — ABNORMAL LOW (ref >60)
Glucose, Bld: 156 mg/dL — ABNORMAL HIGH (ref 70–99)
Potassium: 4.6 mmol/L (ref 3.5–5.1)
Sodium: 138 mmol/L (ref 135–145)

## 2018-03-19 LAB — LIPASE, BLOOD: Lipase: 888 U/L — ABNORMAL HIGH (ref 11–51)

## 2018-03-19 LAB — LACTIC ACID, PLASMA
LACTIC ACID, VENOUS: 2.3 mmol/L — AB (ref 0.5–1.9)
Lactic Acid, Venous: 2.6 mmol/L (ref 0.5–1.9)

## 2018-03-19 LAB — URINE DRUG SCREEN, QUALITATIVE (ARMC ONLY)
Amphetamines, Ur Screen: NOT DETECTED
Barbiturates, Ur Screen: NOT DETECTED
Benzodiazepine, Ur Scrn: NOT DETECTED
Cannabinoid 50 Ng, Ur ~~LOC~~: NOT DETECTED
Cocaine Metabolite,Ur ~~LOC~~: NOT DETECTED
MDMA (Ecstasy)Ur Screen: NOT DETECTED
Methadone Scn, Ur: NOT DETECTED
Opiate, Ur Screen: POSITIVE — AB
Phencyclidine (PCP) Ur S: NOT DETECTED
Tricyclic, Ur Screen: NOT DETECTED

## 2018-03-19 LAB — CBC
HCT: 46.6 % — ABNORMAL HIGH (ref 36.0–46.0)
Hemoglobin: 14.3 g/dL (ref 12.0–15.0)
MCH: 26.1 pg (ref 26.0–34.0)
MCHC: 30.7 g/dL (ref 30.0–36.0)
MCV: 85.2 fL (ref 80.0–100.0)
PLATELETS: 476 10*3/uL — AB (ref 150–400)
RBC: 5.47 MIL/uL — ABNORMAL HIGH (ref 3.87–5.11)
RDW: 14.5 % (ref 11.5–15.5)
WBC: 28.2 10*3/uL — AB (ref 4.0–10.5)
nRBC: 0 % (ref 0.0–0.2)

## 2018-03-19 LAB — MRSA PCR SCREENING: MRSA by PCR: NEGATIVE

## 2018-03-19 LAB — TRIGLYCERIDES: TRIGLYCERIDES: 322 mg/dL — AB (ref <150)

## 2018-03-19 LAB — PROCALCITONIN: Procalcitonin: 18.99 ng/mL

## 2018-03-19 LAB — INFLUENZA PANEL BY PCR (TYPE A & B)
Influenza A By PCR: NEGATIVE
Influenza B By PCR: NEGATIVE

## 2018-03-19 LAB — GLUCOSE, CAPILLARY: Glucose-Capillary: 176 mg/dL — ABNORMAL HIGH (ref 70–99)

## 2018-03-19 MED ORDER — PIPERACILLIN-TAZOBACTAM 3.375 G IVPB
3.3750 g | Freq: Three times a day (TID) | INTRAVENOUS | Status: DC
  Administered 2018-03-19 – 2018-03-27 (×26): 3.375 g via INTRAVENOUS
  Filled 2018-03-19 (×26): qty 50

## 2018-03-19 MED ORDER — BISACODYL 10 MG RE SUPP
10.0000 mg | Freq: Once | RECTAL | Status: DC
  Filled 2018-03-19: qty 1

## 2018-03-19 MED ORDER — POLYETHYLENE GLYCOL 3350 17 G PO PACK
17.0000 g | PACK | Freq: Every day | ORAL | Status: DC
  Administered 2018-03-20 – 2018-03-26 (×4): 17 g via ORAL
  Filled 2018-03-19 (×5): qty 1

## 2018-03-19 MED ORDER — CHLORHEXIDINE GLUCONATE 0.12 % MT SOLN
15.0000 mL | Freq: Two times a day (BID) | OROMUCOSAL | Status: DC
  Administered 2018-03-19 – 2018-03-27 (×14): 15 mL via OROMUCOSAL
  Filled 2018-03-19 (×16): qty 15

## 2018-03-19 MED ORDER — LACTATED RINGERS IV SOLN
INTRAVENOUS | Status: DC
  Administered 2018-03-19: 22:00:00 via INTRAVENOUS
  Administered 2018-03-20: 75 mL/h via INTRAVENOUS
  Administered 2018-03-21 – 2018-03-23 (×4): via INTRAVENOUS

## 2018-03-19 MED ORDER — ORAL CARE MOUTH RINSE
15.0000 mL | Freq: Two times a day (BID) | OROMUCOSAL | Status: DC
  Administered 2018-03-21 – 2018-03-27 (×9): 15 mL via OROMUCOSAL

## 2018-03-19 MED ORDER — LACTATED RINGERS IV BOLUS
1000.0000 mL | Freq: Once | INTRAVENOUS | Status: AC
  Administered 2018-03-19: 1000 mL via INTRAVENOUS

## 2018-03-19 MED ORDER — SODIUM CHLORIDE 0.9 % IV SOLN
INTRAVENOUS | Status: DC | PRN
  Administered 2018-03-19: 09:00:00 500 mL via INTRAVENOUS
  Administered 2018-03-23: 250 mL via INTRAVENOUS

## 2018-03-19 NOTE — Progress Notes (Signed)
[employer] [facility] [place] [facility]                                                                                                                                                                                  Patient Demographics   Terri Berry, is a 43 y.o. female, DOB - [birth year], TML:[id number]  Admit date - 03/18/2018   Admitting Physician Milagros Loll, MD  Outpatient Primary MD for the patient is Thomes Dinning, MD   LOS - 1  Subjective: Patient continues to have worsening shortness of breath, now on 5 L oxygen sats at 90%, patient feels short of breath tachypneic    Review of Systems:   CONSTITUTIONAL: No documented fever. No fatigue, weakness. No weight gain, no weight loss.  EYES: No blurry or double vision.  ENT: No tinnitus. No postnasal drip. No redness of the oropharynx.  RESPIRATORY: No cough, no wheeze, no hemoptysis.  Positive dyspnea.  CARDIOVASCULAR: No chest pain. No orthopnea. No palpitations. No syncope.  GASTROINTESTINAL: No nausea, no vomiting or diarrhea. No abdominal pain. No melena or hematochezia.  GENITOURINARY: No dysuria or hematuria.  ENDOCRINE: No polyuria or nocturia. No heat or cold intolerance.  HEMATOLOGY: No anemia. No bruising. No bleeding.  INTEGUMENTARY: No rashes. No lesions.  MUSCULOSKELETAL: No arthritis. No swelling. No gout.  NEUROLOGIC: No numbness, tingling, or ataxia. No seizure-type activity.  PSYCHIATRIC: No anxiety. No insomnia. No ADD.    Vitals:   Vitals:   03/19/18 1100 03/19/18 1258 03/19/18 1419 03/19/18 1500  BP: 96/67 93/68 97/73  95/76  Pulse: (!) 132 (!) 130 (!) 131 (!) 130  Resp: 20 19 19    Temp:  (!) 100.9 F (38.3 C) 98 F (36.7 C) 98.3 F (36.8 C)  TempSrc:  Oral Oral Oral  SpO2: 97% 91% 91% 91%  Weight:      Height:        Wt Readings from Last 3 Encounters:  03/19/18 99 kg  03/05/18 95.5 kg  02/15/18 94.4 kg     Intake/Output Summary (Last 24 hours) at 03/19/2018 1525 Last  data filed at 03/19/2018 1300 Gross per 24 hour  Intake 2211.75 ml  Output 500 ml  Net 1711.75 ml    Physical Exam:   GENERAL: Critically ill-appearing HEAD, EYES, EARS, NOSE AND THROAT: Atraumatic, normocephalic. Extraocular muscles are intact. Pupils equal and reactive to light. Sclerae anicteric. No conjunctival injection. No oro-pharyngeal erythema.  NECK: Supple. There is no jugular venous distention. No bruits, no lymphadenopathy, no thyromegaly.  HEART: Regular rate and rhythm,. No murmurs, no rubs, no clicks.  LUNGS: Positive accessory muscle usage.  ABDOMEN: Soft, distended EXTREMITIES: No evidence of any cyanosis,  clubbing, or peripheral edema.  +2 pedal and radial pulses bilaterally.  NEUROLOGIC: The patient is alert, awake, and oriented x3 with no focal motor or sensory deficits appreciated bilaterally.  SKIN: Moist and warm with no rashes appreciated.  Psych: Not anxious, depressed LN: No inguinal LN enlargement    Antibiotics   Anti-infectives (From admission, onward)   Start     Dose/Rate Route Frequency Ordered Stop   03/19/18 0845  piperacillin-tazobactam (ZOSYN) IVPB 3.375 g     3.375 g 12.5 mL/hr over 240 Minutes Intravenous Every 8 hours 03/19/18 0835        Medications   Scheduled Meds: . atorvastatin  40 mg Oral Daily  . enoxaparin (LOVENOX) injection  40 mg Subcutaneous Q24H  . fenofibrate  160 mg Oral Daily  . FLUoxetine  40 mg Oral Daily  . fluticasone  2 spray Each Nare BID  . pantoprazole  40 mg Oral Daily  . senna-docusate  2 tablet Oral BID  . sodium chloride flush  3 mL Intravenous Q12H   Continuous Infusions: . sodium chloride 500 mL (03/19/18 0913)  . 0.9 % NaCl with KCl 20 mEq / L 125 mL/hr at 03/19/18 1145  . piperacillin-tazobactam (ZOSYN)  IV 3.375 g (03/19/18 1419)  . sodium chloride     PRN Meds:.sodium chloride, acetaminophen **OR** acetaminophen, albuterol, clonazePAM, metoCLOPramide (REGLAN) injection, morphine, morphine  injection, promethazine   Data Review:   Micro Results No results found for this or any previous visit (from the past 240 hour(s)).  Radiology Reports Dg Abd 1 View  Result Date: 03/19/2018 CLINICAL DATA:  History of influenza.  Abdominal infection. EXAM: ABDOMEN - 1 VIEW COMPARISON:  CT abdomen pelvis 03/18/2018 FINDINGS: Bibasilar heterogeneous opacities. Contrast material within the urinary bladder. Gaseous distended loops of small bowel within the central abdomen. Gas within the colon. No free intraperitoneal air. IMPRESSION: Findings suggestive of ileus. Electronically Signed   By: Annia Belt M.D.   On: 03/19/2018 09:10   Ct Abdomen Pelvis W Contrast  Result Date: 03/18/2018 CLINICAL DATA:  Abdominal pain with vomiting. History of pancreatitis. EXAM: CT ABDOMEN AND PELVIS WITH CONTRAST TECHNIQUE: Multidetector CT imaging of the abdomen and pelvis was performed using the standard protocol following bolus administration of intravenous contrast. CONTRAST:  OMNIPAQUE IOHEXOL 300 MG/ML  SOLN COMPARISON:  Abdominopelvic CT 02/10/2018 and 02/09/2018. FINDINGS: Lower chest: Bibasilar atelectasis has mildly improved. There is no significant pleural or pericardial effusion. Hepatobiliary: The hepatic density is within normal limits and no focal lesion identified. No significant biliary dilatation post cholecystectomy. Pancreas: The pancreas remains diffusely enlarged, although appears homogeneous without evidence of necrosis or acute hemorrhage. There is increasing peripancreatic inflammation and ill-defined fluid. No pancreatic ductal dilatation. Spleen: Normal in size without focal abnormality. Adrenals/Urinary Tract: Both adrenal glands appear normal. There are tiny cysts within the upper poles of both kidneys. No evidence of renal mass, urinary tract calculus or hydronephrosis. The bladder appears normal. Stomach/Bowel: No evidence of bowel wall thickening, distention or surrounding inflammatory  change. The appendix appears normal. Moderate stool in the proximal colon. Vascular/Lymphatic: There are no enlarged abdominal or pelvic lymph nodes. Stable 12 mm portacaval node on image 35/2, likely reactive. The superior mesenteric and splenic veins are attenuated by the pancreatic inflammatory changes, although remain patent. No evidence of large vessel occlusion. Reproductive: Hysterectomy.  No evidence of adnexal mass. Other: Volume of pelvic ascites has not significantly increased. As above, there is slightly greater inflammatory change and ill-defined fluid surrounding  the pancreas and extending into both pericolic gutters. No focal fluid collections are identified. Musculoskeletal: No acute or significant osseous findings. Severe discogenic sclerosis at L4-5. IMPRESSION: 1. Slight progression of extensive peripancreatic inflammatory changes with ill-defined fluid. No evidence of pancreatic hemorrhage, necrosis or focal fluid collection. 2. Attenuation of the splenic and superior mesenteric veins without evidence of large vessel occlusion. 3. Improved bibasilar atelectasis. Electronically Signed   By: Carey Bullocks M.D.   On: 03/18/2018 12:59   Dg Chest Port 1 View  Result Date: 03/19/2018 CLINICAL DATA:  Shortness of breath EXAM: PORTABLE CHEST 1 VIEW COMPARISON:  02/10/2018 FINDINGS: Low volume chest with streaky densities at the bases correlating with atelectasis by abdominal CT yesterday. No edema, effusion, or pneumothorax. Normal heart size and mediastinal contours. IMPRESSION: Low volume chest with bilateral lower lobe atelectasis. Electronically Signed   By: Marnee Spring M.D.   On: 03/19/2018 09:10     CBC Recent Labs  Lab 03/18/18 1140 03/19/18 0330  WBC 23.6* 28.2*  HGB 13.6 14.3  HCT 44.4 46.6*  PLT 466* 476*  MCV 84.7 85.2  MCH 26.0 26.1  MCHC 30.6 30.7  RDW 14.1 14.5    Chemistries  Recent Labs  Lab 03/18/18 1140 03/19/18 0330  NA 135 138  K 3.2* 4.6  CL 97* 103   CO2 24 21*  GLUCOSE 187* 156*  BUN 14 24*  CREATININE 0.98 2.07*  CALCIUM 9.2 8.6*  AST 86*  --   ALT 36  --   ALKPHOS 61  --   BILITOT 0.8  --    ------------------------------------------------------------------------------------------------------------------ estimated creatinine clearance is 41.9 mL/min (A) (by C-G formula based on SCr of 2.07 mg/dL (H)). ------------------------------------------------------------------------------------------------------------------ No results for input(s): HGBA1C in the last 72 hours. ------------------------------------------------------------------------------------------------------------------ Recent Labs    03/18/18 1140  TRIG 552*   ------------------------------------------------------------------------------------------------------------------ No results for input(s): TSH, T4TOTAL, T3FREE, THYROIDAB in the last 72 hours.  Invalid input(s): FREET3 ------------------------------------------------------------------------------------------------------------------ No results for input(s): VITAMINB12, FOLATE, FERRITIN, TIBC, IRON, RETICCTPCT in the last 72 hours.  Coagulation profile No results for input(s): INR, PROTIME in the last 168 hours.  No results for input(s): DDIMER in the last 72 hours.  Cardiac Enzymes Recent Labs  Lab 03/19/18 0916 03/19/18 1402  TROPONINI 0.07* 0.06*   ------------------------------------------------------------------------------------------------------------------ Invalid input(s): POCBNP    Assessment & Plan   Patient is 43 year old admitted with acute pancreatitis  1.  Acute respiratory failure in setting of acute pancreatitis Suspect this is due to ARDS I have discussed the case with Dr. Belia Heman of pulmonary Patient will be transferred to the ICU for closer monitoring may need BiPAP/intubation Obtain a stat CT without contrast.       Code Status Orders  (From admission, onward)          Start     Ordered   03/18/18 1407  Full code  Continuous     03/18/18 1409        Code Status History    Date Active Date Inactive Code Status Order ID Comments User Context   02/09/2018 0921 02/15/2018 1549 Full Code [id number]  Barbaraann Rondo, MD Inpatient   11/28/2017 0352 12/04/2017 2009 Full Code [id number]  Barbaraann Rondo, MD Inpatient           Consults critical care   DVT Prophylaxis  Lovenox   Lab Results  Component Value Date   PLT 476 (H) 03/19/2018     Time Spent in minutes   45 minutes additional critical care  time spent  Greater than 50% of time spent in care coordination and counseling patient regarding the condition and plan of care.   Auburn BilberryShreyang Makhya Arave M.D on 03/19/2018 at 3:25 PM  Between 7am to 6pm - Pager - [phone]  After 6pm go to [email or web address] - Social research officer, governmentpassword [email or web address]  [employer]   Office  [phone]

## 2018-03-19 NOTE — Progress Notes (Signed)
Dana PA notified of troponin. No additional orders given.

## 2018-03-19 NOTE — Consult Note (Signed)
Terri Miniumarren Hatice Bubel, MD [facility]  [address] [place], [place] [address] Phone: [phone] Fax : [phone]  Consultation  Referring Provider:     Dr. Allena Berry Primary Care Physician:  Thomes Berry, Cynthia, MD Primary Gastroenterologist:  Dr. Allegra Berry Reason for Consultation:     Pancreatitis  Date of Admission:  03/18/2018 Date of Consultation:  03/19/2018         HPI:   Terri LotDiane Berry is a 43 y.o. female Who is admitted with a lipase elevation of 1044 but has had pancreatitis in the past.  The patient's last episode appears to have peaked in January of this year with a lipase of 4582.  The patient's pancreatic enzymes have never gone back to normal and always around the 150 level. The patient was thought to be from increased triglycerides. During her CT scan back in January of this year there was mild intrahepatic dilatation that was thought to be the sequelae or prior cholecystectomy. Patient had another CT scan in the next day due to increasing pain and extensive peripancreatic inflammation was seen. The patient's CT scan yesterday showed slight progression of extensive peripancreatic inflammation with ill-defined fluid no evidence of pancreatic hemorrhage necrosis or any focal fluid collection. The patient now reports that she is been having severe abdominal pain with abdominal distention. In October 2019 the patient also had a CT scan showing pancreatitis.  The patient is not reporting any nausea or vomiting but continues to have abdominal pain.  She also reports that she is been short of breath.  Past Medical History:  Diagnosis Date  . Allergy   . Anxiety   . Depression   . GERD (gastroesophageal reflux disease)   . Hypertension   . IBS (irritable bowel syndrome)     Past Surgical History:  Procedure Laterality Date  . ABDOMINAL HYSTERECTOMY    . GALLBLADDER SURGERY      Prior to Admission medications   Medication Sig Start Date End Date Taking? Authorizing Provider  albuterol  (PROVENTIL HFA;VENTOLIN HFA) 108 (90 Base) MCG/ACT inhaler Inhale 2 puffs into the lungs every 6 (six) hours as needed for wheezing or shortness of breath. 02/15/18  Yes Terri Berry, Qing, MD  atorvastatin (LIPITOR) 40 MG tablet Take 40 mg by mouth daily.   Yes [provider]  fenofibrate 160 MG tablet Take 160 mg by mouth daily. 12/25/17 04/07/18 Yes [provider]  FLUoxetine (PROZAC) 40 MG capsule Take 40 mg by mouth daily.   Yes [provider]  fluticasone (FLONASE) 50 MCG/ACT nasal spray Place 2 sprays into both nostrils 2 (two) times daily.   Yes [provider]  furosemide (LASIX) 20 MG tablet Take 20 mg by mouth daily.  03/05/18 03/05/19 Yes [provider]  ibuprofen (ADVIL,MOTRIN) 200 MG tablet Take 200 mg by mouth every 6 (six) hours as needed.   Yes [provider]  mirtazapine (REMERON) 30 MG tablet Take 30 mg by mouth at bedtime.   Yes [provider]  morphine (MSIR) 15 MG tablet Take 15 mg by mouth every 6 (six) hours as needed. 03/15/18 03/20/18 Yes [provider]  nystatin (MYCOSTATIN/NYSTOP) powder Apply topically 2 (two) times daily.  03/05/18 03/20/18 Yes [provider]  omeprazole (PRILOSEC) 20 MG capsule Take 40 mg by mouth 2 (two) times daily before a meal.   Yes [provider]  Pancrelipase, Lip-Prot-Amyl, 24000-76000 units CPEP Take 24,000-48,000 Units by mouth 3 (three) times daily before meals. Take 2 capsules by mouth  three times daily with meals and 1 capsule by mouth with snacks.   Yes [provider]  polyethylene glycol (MIRALAX / GLYCOLAX) packet Take 17 g by mouth 2 (two) times daily.   Yes [provider]  pregabalin (LYRICA) 200 MG capsule Take 200 mg by mouth 3 (three) times daily.   Yes [provider]  prochlorperazine (COMPAZINE) 10 MG tablet Take 10 mg by mouth every 6 (six) hours as needed.  02/18/18  Yes [provider]  promethazine (PHENERGAN)  12.5 MG tablet Take 1 tablet (12.5 mg total) by mouth every 6 (six) hours as needed for nausea or vomiting. 12/04/17  Yes Terri, Allyn KennerKaty Dodd, MD  senna-docusate (SENOKOT-S) 8.6-50 MG tablet Take 2 tablets by mouth 2 (two) times daily. Patient taking differently: Take 4 tablets by mouth at bedtime.  12/04/17  Yes Terri, Allyn KennerKaty Dodd, MD  clonazePAM (KLONOPIN) 1 MG tablet Take 1 mg by mouth 2 (two) times daily as needed for anxiety.     [provider]    Family History  Problem Relation Age of Onset  . Diabetes Father   . Heart disease Father   . Kidney disease Father   . COPD Father   . Cancer Paternal Aunt   . Diabetes Paternal Uncle   . Heart disease Paternal Uncle      Social History   Tobacco Use  . Smoking status: Current Every Day Smoker    Packs/day: 0.50    Types: Cigarettes  . Smokeless tobacco: Never Used  Substance Use Topics  . Alcohol use: Not Currently  . Drug use: Not Currently    Allergies as of 03/18/2018 - Review Complete 03/18/2018  Allergen Reaction Noted  . Precedex [dexmedetomidine hcl in nacl] Other (See Comments) 02/09/2018  . Cefpodoxime  10/31/2017  . Clarithromycin  10/31/2017  . Doxycycline  10/31/2017  . Ferrous gluconate  10/31/2017  . Lactose intolerance (gi)  12/02/2017  . Levofloxacin  10/31/2017  . Rizatriptan  10/31/2017  . Sulfa antibiotics  10/31/2017  . Topiramate  10/31/2017  . Venlafaxine  10/31/2017  . Zofran [ondansetron hcl]  10/31/2017    Review of Systems:    All systems reviewed and negative except where noted in HPI.   Physical Exam:  Vital signs in last 24 hours: Temp:  [97.8 F (36.6 C)-102.3 F (39.1 C)] 99.2 F (37.3 C) (02/13 1932) Pulse Rate:  [120-134] 122 (02/13 1800) Resp:  [17-23] 17 (02/13 1800) BP: (90-135)/(57-98) 98/68 (02/13 1932) SpO2:  [84 %-99 %] 92 % (02/13 1919) FiO2 (%):  [50 %] 50 % (02/13 1932) Weight:  [98.1 kg-99 kg] 98.1 kg (02/13 1627) Last BM Date: 03/17/18 General:   Pleasant,  cooperative in NAD Head:  Normocephalic and atraumatic. Eyes:   No icterus.   Conjunctiva pink. PERRLA. Ears:  Normal auditory acuity. Neck:  Supple; no masses or thyroidomegaly Lungs: Increased respirations. Lungs clear to auscultation bilaterally.   No wheezes, crackles, or rhonchi.  Heart:  Regular rate and rhythm;  Without murmur, clicks, rubs or gallops Abdomen:  Soft, nondistended, Positive tenderness diffusely with tympany and decreased bowel sounds. No appreciable masses or hepatomegaly.  No rebound or guarding.  Rectal:  Not performed. Msk:  Symmetrical without gross deformities.   Extremities:  Without edema, cyanosis or clubbing. Neurologic:  Alert and oriented x3;  grossly normal neurologically. Skin:  Intact without significant lesions or rashes. Cervical Nodes:  No significant cervical adenopathy. Psych:  Alert and cooperative. Normal affect.  LAB  RESULTS: Recent Labs    03/18/18 1140 03/19/18 0330  WBC 23.6* 28.2*  HGB 13.6 14.3  HCT 44.4 46.6*  PLT 466* 476*   BMET Recent Labs    03/18/18 1140 03/19/18 0330  NA 135 138  K 3.2* 4.6  CL 97* 103  CO2 24 21*  GLUCOSE 187* 156*  BUN 14 24*  CREATININE 0.98 2.07*  CALCIUM 9.2 8.6*   LFT Recent Labs    03/18/18 1140  PROT 8.3*  ALBUMIN 4.2  AST 86*  ALT 36  ALKPHOS 61  BILITOT 0.8   PT/INR No results for input(s): LABPROT, INR in the last 72 hours.  STUDIES: Dg Abd 1 View  Result Date: 03/19/2018 CLINICAL DATA:  Distended abdomen. EXAM: ABDOMEN - 1 VIEW COMPARISON:  03/19/2018 FINDINGS: Mild gaseous distention of the colon. There is no small bowel dilation to suggest obstruction. Soft tissues are unremarkable.  No acute skeletal abnormality. IMPRESSION: 1. No evidence of bowel obstruction.  No acute finding. 2. Mild gaseous distention of the colon. Electronically Signed   By: Amie Portland M.D.   On: 03/19/2018 16:37   Dg Abd 1 View  Result Date: 03/19/2018 CLINICAL DATA:  History of influenza.   Abdominal infection. EXAM: ABDOMEN - 1 VIEW COMPARISON:  CT abdomen pelvis 03/18/2018 FINDINGS: Bibasilar heterogeneous opacities. Contrast material within the urinary bladder. Gaseous distended loops of small bowel within the central abdomen. Gas within the colon. No free intraperitoneal air. IMPRESSION: Findings suggestive of ileus. Electronically Signed   By: Annia Belt M.D.   On: 03/19/2018 09:10   Ct Chest Wo Contrast  Result Date: 03/19/2018 CLINICAL DATA:  43 y/o F; acute respiratory failure in the setting of acute pancreatitis. EXAM: CT CHEST WITHOUT CONTRAST TECHNIQUE: Multidetector CT imaging of the chest was performed following the standard protocol without IV contrast. COMPARISON:  03/19/2018 chest radiograph. 03/18/2018 CT abdomen and pelvis. FINDINGS: Cardiovascular: No significant vascular findings. Normal heart size. No pericardial effusion. Mediastinum/Nodes: No enlarged mediastinal or axillary lymph nodes. Thyroid gland, trachea, and esophagus demonstrate no significant findings. Lungs/Pleura: Low lung volumes. Small left pleural effusion. Bibasilar consolidations. No findings of pulmonary edema. No pneumothorax. Upper Abdomen: Partially visualized edema within the upper abdomen. Musculoskeletal: No fracture is seen. IMPRESSION: 1. Progressed small left pleural effusion, low lung volumes, and bibasilar consolidations which probably represent atelectasis, less likely pneumonia. Findings atypical for ARDS. 2. Stable partially visualized edema within the upper abdomen. Electronically Signed   By: Mitzi Hansen M.D.   On: 03/19/2018 16:23   Ct Abdomen Pelvis W Contrast  Result Date: 03/18/2018 CLINICAL DATA:  Abdominal pain with vomiting. History of pancreatitis. EXAM: CT ABDOMEN AND PELVIS WITH CONTRAST TECHNIQUE: Multidetector CT imaging of the abdomen and pelvis was performed using the standard protocol following bolus administration of intravenous contrast. CONTRAST:   OMNIPAQUE IOHEXOL 300 MG/ML  SOLN COMPARISON:  Abdominopelvic CT 02/10/2018 and 02/09/2018. FINDINGS: Lower chest: Bibasilar atelectasis has mildly improved. There is no significant pleural or pericardial effusion. Hepatobiliary: The hepatic density is within normal limits and no focal lesion identified. No significant biliary dilatation post cholecystectomy. Pancreas: The pancreas remains diffusely enlarged, although appears homogeneous without evidence of necrosis or acute hemorrhage. There is increasing peripancreatic inflammation and ill-defined fluid. No pancreatic ductal dilatation. Spleen: Normal in size without focal abnormality. Adrenals/Urinary Tract: Both adrenal glands appear normal. There are tiny cysts within the upper poles of both kidneys. No evidence of renal mass, urinary tract calculus or hydronephrosis. The bladder appears  normal. Stomach/Bowel: No evidence of bowel wall thickening, distention or surrounding inflammatory change. The appendix appears normal. Moderate stool in the proximal colon. Vascular/Lymphatic: There are no enlarged abdominal or pelvic lymph nodes. Stable 12 mm portacaval node on image 35/2, likely reactive. The superior mesenteric and splenic veins are attenuated by the pancreatic inflammatory changes, although remain patent. No evidence of large vessel occlusion. Reproductive: Hysterectomy.  No evidence of adnexal mass. Other: Volume of pelvic ascites has not significantly increased. As above, there is slightly greater inflammatory change and ill-defined fluid surrounding the pancreas and extending into both pericolic gutters. No focal fluid collections are identified. Musculoskeletal: No acute or significant osseous findings. Severe discogenic sclerosis at L4-5. IMPRESSION: 1. Slight progression of extensive peripancreatic inflammatory changes with ill-defined fluid. No evidence of pancreatic hemorrhage, necrosis or focal fluid collection. 2. Attenuation of the splenic and  superior mesenteric veins without evidence of large vessel occlusion. 3. Improved bibasilar atelectasis. Electronically Signed   By: Carey Bullocks M.D.   On: 03/18/2018 12:59   Dg Chest Port 1 View  Result Date: 03/19/2018 CLINICAL DATA:  Shortness of breath EXAM: PORTABLE CHEST 1 VIEW COMPARISON:  02/10/2018 FINDINGS: Low volume chest with streaky densities at the bases correlating with atelectasis by abdominal CT yesterday. No edema, effusion, or pneumothorax. Normal heart size and mediastinal contours. IMPRESSION: Low volume chest with bilateral lower lobe atelectasis. Electronically Signed   By: Marnee Spring M.D.   On: 03/19/2018 09:10   Dg Abd Portable 1v  Result Date: 03/19/2018 CLINICAL DATA:  Post NG tube placement EXAM: PORTABLE ABDOMEN - 1 VIEW COMPARISON:  03/19/2018 FINDINGS: Interval placement of nasogastric tube which decompresses the stomach. Nasogastric tube tip overlies the level of the mid stomach. Persistent gaseous distension of bowel loops noted. IMPRESSION: NG tube tip is in the stomach. Electronically Signed   By: Norva Pavlov M.D.   On: 03/19/2018 17:53      Impression / Plan:   Assessment: Active Problems:   Acute pancreatitis   Louvinia Kamber is a 43 y.o. y/o female with what appears to be recurrent pancreatitis.  The patient has never had an MRCP to rule out a bile duct stone as the cause of her recurrent pancreatitis. It has been blamed on her triglycerides which have remained elevated.  The MRCP may also tell if the patient has pancreatic divisum.  Due to the patient's abdominal distention and decreased bowel sounds a KUB was ordered stat.  This showed the patient to have an ileus.  Due to progressive shortness of breath the patient was transferred to the ICU.  Plan:  The patient has ileus with pancreatitis presumed to be caused by hypertriglyceridemia.  When the patient is doing better I suggest that she undergo an MRCP to better delineate the pancreas and  rule out pancreatic divisum versus a common bile duct stone versus pancreatic strictures as the cause of her recurrent pancreatitis.  For now I would recommend treating the patient symptomatically with IV fluids and pain medication.  Thank you for involving me in the care of this patient.      LOS: 1 day   Terri Minium, MD  03/19/2018, 8:01 PM    Note: This dictation was prepared with Dragon dictation along with smaller phrase technology. Any transcriptional errors that result from this process are unintentional.

## 2018-03-19 NOTE — Progress Notes (Signed)
Pharmacy Antibiotic Note  Terri Berry is a 43 y.o. female admitted on 03/18/2018 with intra-abdominal infection.  Pharmacy has been consulted for Zosyn dosing.  Plan: Zosyn 3.375g IV q8h (4 hour infusion).  Height: 5\' 7"  (170.2 cm) Weight: 218 lb 4.8 oz (99 kg) IBW/kg (Calculated) : 61.6  Temp (24hrs), Avg:99.3 F (37.4 C), Min:97.6 F (36.4 C), Max:101.8 F (38.8 C)  Recent Labs  Lab 03/18/18 1140 03/19/18 0330  WBC 23.6* 28.2*  CREATININE 0.98 2.07*    Estimated Creatinine Clearance: 41.9 mL/min (A) (by C-G formula based on SCr of 2.07 mg/dL (H)).    Allergies  Allergen Reactions  . Precedex [Dexmedetomidine Hcl In Nacl] Other (See Comments)    Significant Bradycardia  . Cefpodoxime     Tolerates augmentin and Keflex   . Clarithromycin   . Doxycycline   . Ferrous Gluconate   . Lactose Intolerance (Gi)   . Levofloxacin   . Rizatriptan   . Sulfa Antibiotics   . Topiramate   . Venlafaxine   . Zofran [Ondansetron Hcl]     Antimicrobials this admission: Zosyn 2/13 >>   Thank you for allowing pharmacy to be a part of this patient's care.  Clovia Cuff, PharmD, BCPS 03/19/2018 8:37 AM

## 2018-03-19 NOTE — Progress Notes (Addendum)
CRITICAL VALUE ALERT  Critical Value:  0.07  Date & Time Notied:  03/19/18 1015  Provider Notified: Dr. Allena Katz   Orders Received/Actions taken: none at this time  Reported VS

## 2018-03-19 NOTE — Consult Note (Signed)
NAME: Terri Berry  DOB: Apr 27, 1975  MRN: [id number]  Date/Time: 03/19/2018 4:21 PM  REQUESTING PROVIDER: Dr. Allena Katz Subjective:  REASON FOR CONSULT: Fever ?Chart reviewed.  Patient gives minimal history because of distress secondary to abdominal pain and having NG tube and oxygen   Terri Berry is a 43 y.o. female with a history of pancreatitis, hypertriglyceridemia, depression presents to the hospital via [facility] on 03/18/2018 with abdominal pain and emesis x2.   Patient was initially diagnosed with acute pancreatitis in September 2019 when she was admitted to [facility] and since then has had multiple flareups and has baseline abdominal pain .  The etiology was initially unclear but hypertriglyceridemia has been questioned as the source of the pancreatitis.  She does not drink any alcohol.  She is undergone cholecystectomy few years ago.   patient was recently in [facility] in January 2020 for acute pancreatitis and during that hospitalization she was intubated for a short period of time.  After discharge home she was continuing to have abdominal pain intermittently with no relation to food or activity.    She did go to [facility] on 03/14/2018 with abdominal pain.  Her lipase then was 912, creatinine was 1.01, WBC 9.5, Hb 11.2.  Her vitals were stable.  She was given Toradol and Haldol.  She was tolerating p.o. during her entire emergency department stay and ate a whole bag of chips and drank soda as per the ED note.  So they sent her home on pain medication and close outpatient follow-up.  In the ED yesterday she had a temperature of 98.2, blood pressure of 137/91.  WBC was 23.6, hemoglobin 13.6 and platelet was 466, creatinine was 0.98 and lipase was 1044.  She had a CT abdomen with contrast and it showed  extensive peripancreatic inflammatory changes with ill-defined fluid.  No evidence of pancreatic hemorrhage necrosis or focal fluid collection She started on IV fluids.  She then started developing fever with  T-max of 102.3 at 10 AM this morning.  She was also getting short of breath and her abdomen was tight.  She was started on Zosyn and she was transferred to ICU.  I am asked to see the patient as she has developed fever in the hospital.  She had a CT of the chest today and that revealed a small left pleural effusion and low lung volumes and bibasilar consolidation which probably represents atelectasis and less likely to be pneumonia.  And x-ray of the abdomen revealed gaseous distention of the bowel loops. She has an NG tube and it is draining bilious fluid.  She is also on high flow oxygen  Past Medical History:  Diagnosis Date  . Allergy   . Anxiety   . Depression   . GERD (gastroesophageal reflux disease)   . Hypertension   . IBS (irritable bowel syndrome)     Past Surgical History:  Procedure Laterality Date  . ABDOMINAL HYSTERECTOMY    . GALLBLADDER SURGERY    Social history Lives with her father Non-smoker does not drink alcohol Family History  Problem Relation Age of Onset  . Diabetes Father   . Heart disease Father   . Kidney disease Father   . COPD Father   . Cancer Paternal Aunt   . Diabetes Paternal Uncle   . Heart disease Paternal Uncle    Allergies  Allergen Reactions  . Precedex [Dexmedetomidine Hcl In Nacl] Other (See Comments)    Significant Bradycardia  . Cefpodoxime     Tolerates  augmentin and Keflex   . Clarithromycin   . Doxycycline   . Ferrous Gluconate   . Lactose Intolerance (Gi)   . Levofloxacin   . Rizatriptan   . Sulfa Antibiotics   . Topiramate   . Venlafaxine   . Zofran [Ondansetron Hcl]    ? Current Facility-Administered Medications  Medication Dose Route Frequency Provider Last Rate Last Dose  . 0.9 %  sodium chloride infusion   Intravenous PRN Auburn BilberryPatel, Shreyang, MD 10 mL/hr at 03/19/18 0913 500 mL at 03/19/18 0913  . 0.9 % NaCl with KCl 20 mEq/ L  infusion   Intravenous Continuous Auburn BilberryPatel, Shreyang, MD 125 mL/hr at 03/19/18 1145    .  acetaminophen (TYLENOL) tablet 650 mg  650 mg Oral Q6H PRN Milagros LollSudini, Srikar, MD   650 mg at 03/19/18 1143   Or  . acetaminophen (TYLENOL) suppository 650 mg  650 mg Rectal Q6H PRN Sudini, Wardell HeathSrikar, MD      . albuterol (PROVENTIL) (2.5 MG/3ML) 0.083% nebulizer solution 2.5 mg  2.5 mg Nebulization Q2H PRN Sudini, Srikar, MD      . atorvastatin (LIPITOR) tablet 40 mg  40 mg Oral Daily Milagros LollSudini, Srikar, MD   40 mg at 03/18/18 2154  . chlorhexidine (PERIDEX) 0.12 % solution 15 mL  15 mL Mouth Rinse BID Auburn BilberryPatel, Shreyang, MD      . clonazePAM Scarlette Calico(KLONOPIN) tablet 1 mg  1 mg Oral BID PRN Milagros LollSudini, Srikar, MD   1 mg at 03/18/18 1814  . enoxaparin (LOVENOX) injection 40 mg  40 mg Subcutaneous Q24H Sudini, Srikar, MD      . fenofibrate tablet 160 mg  160 mg Oral Daily Milagros LollSudini, Srikar, MD   160 mg at 03/19/18 1143  . FLUoxetine (PROZAC) capsule 40 mg  40 mg Oral Daily Milagros LollSudini, Srikar, MD   40 mg at 03/19/18 1143  . fluticasone (FLONASE) 50 MCG/ACT nasal spray 2 spray  2 spray Each Nare BID Milagros LollSudini, Srikar, MD   2 spray at 03/19/18 1144  . MEDLINE mouth rinse  15 mL Mouth Rinse q12n4p Auburn BilberryPatel, Shreyang, MD      . metoCLOPramide (REGLAN) injection 10 mg  10 mg Intravenous Q6H PRN Sudini, Wardell HeathSrikar, MD      . morphine (MSIR) tablet 15 mg  15 mg Oral Q6H PRN Milagros LollSudini, Srikar, MD   15 mg at 03/19/18 0646  . morphine 2 MG/ML injection 2 mg  2 mg Intravenous Q4H PRN Milagros LollSudini, Srikar, MD   2 mg at 03/19/18 0854  . pantoprazole (PROTONIX) EC tablet 40 mg  40 mg Oral Daily Milagros LollSudini, Srikar, MD   40 mg at 03/19/18 1143  . piperacillin-tazobactam (ZOSYN) IVPB 3.375 g  3.375 g Intravenous Q8H Auburn BilberryPatel, Shreyang, MD 12.5 mL/hr at 03/19/18 1419 3.375 g at 03/19/18 1419  . promethazine (PHENERGAN) injection 12.5 mg  12.5 mg Intravenous Q6H PRN Willy Eddyobinson, Patrick, MD   12.5 mg at 03/19/18 1144  . senna-docusate (Senokot-S) tablet 2 tablet  2 tablet Oral BID Milagros LollSudini, Srikar, MD   2 tablet at 03/18/18 2153  . sodium chloride 0.9 % bolus 1,000 mL  1,000 mL  Intravenous Once Sudini, Srikar, MD      . sodium chloride flush (NS) 0.9 % injection 3 mL  3 mL Intravenous Q12H Sudini, Srikar, MD   3 mL at 03/18/18 2200     Abtx:  Anti-infectives (From admission, onward)   Start     Dose/Rate Route Frequency Ordered Stop   03/19/18 0845  piperacillin-tazobactam (ZOSYN) IVPB 3.375  g     3.375 g 12.5 mL/hr over 240 Minutes Intravenous Every 8 hours 03/19/18 0835        REVIEW OF SYSTEMS:  Because of NG tube and high oxygen and also in abdominal distress patient is not able to give a very detailed history or review of system. She says she has abdominal pain She is constipated  She did not have fever at home She does not have any cough but is short of breath now She has no chest pain  Objective:  VITALS:  BP 95/76 (BP Location: Right Arm)   Pulse (!) 130   Temp 98.3 F (36.8 C) (Oral)   Resp 19   Ht 5\' 7"  (1.702 m)   Wt 99 kg   SpO2 90%   BMI 34.19 kg/m  PHYSICAL EXAM:  General: Awake, sitting in the bed, high flow oxygen, NG tube, in distress, oriented x5, looks ill Head: Normocephalic, without obvious abnormality, atraumatic. Eyes: Conjunctivae clear, anicteric sclerae. Pupils are equal ENT Nares normal. No drainage or sinus tenderness. Lips, mucosa, and tongue normal. No Thrush Neck: Supple, symmetrical, no adenopathy, thyroid: non tender no carotid bruit and no JVD. Back: Did not examine properly Lungs: Bilateral air entry, decreased in the bases Heart: S1-S2 tachycardia Abdomen: Distended, bowel sounds exaggerated   extremities: Did not examine in detail  skin: Limited examination Neurologic: Grossly non-focal Pertinent Labs Lab Results CBC CBC Latest Ref Rng & Units 03/19/2018 03/18/2018 03/10/2018  WBC 4.0 - 10.5 K/uL 28.2(H) 23.6(H) 9.1  Hemoglobin 12.0 - 15.0 g/dL 57.814.3 46.913.6 10.5(L)  Hematocrit 36.0 - 46.0 % 46.6(H) 44.4 31.6(L)  Platelets 150 - 400 K/uL 476(H) 466(H) 328    CMP Latest Ref Rng & Units 03/19/2018  03/18/2018 03/10/2018  Glucose 70 - 99 mg/dL 629(B156(H) 284(X187(H) 324(M111(H)  BUN 6 - 20 mg/dL 01(U24(H) 14 13  Creatinine 0.44 - 1.00 mg/dL 2.72(Z2.07(H) 3.660.98 4.40(H1.16(H)  Sodium 135 - 145 mmol/L 138 135 140  Potassium 3.5 - 5.1 mmol/L 4.6 3.2(L) 3.7  Chloride 98 - 111 mmol/L 103 97(L) 99  CO2 22 - 32 mmol/L 21(L) 24 23  Calcium 8.9 - 10.3 mg/dL 4.7(Q8.6(L) 9.2 9.1  Total Protein 6.5 - 8.1 g/dL - 8.3(H) 7.1  Total Bilirubin 0.3 - 1.2 mg/dL - 0.8 <2.5<0.2  Alkaline Phos 38 - 126 U/L - 61 65  AST 15 - 41 U/L - 86(H) 19  ALT 0 - 44 U/L - 36 20        Component Value Date/Time   WBC 28.2 (H) 03/19/2018 0330   RBC 5.47 (H) 03/19/2018 0330   HGB 14.3 03/19/2018 0330   HGB 10.5 (L) 03/10/2018 1827   HCT 46.6 (H) 03/19/2018 0330   HCT 31.6 (L) 03/10/2018 1827   PLT 476 (H) 03/19/2018 0330   PLT 328 03/10/2018 1827   MCV 85.2 03/19/2018 0330   MCV 81 03/10/2018 1827   MCH 26.1 03/19/2018 0330   MCHC 30.7 03/19/2018 0330   RDW 14.5 03/19/2018 0330   RDW 14.1 03/10/2018 1827   LYMPHSABS 3.8 (H) 03/10/2018 1827   MONOABS 2.1 (H) 02/10/2018 2058   EOSABS 0.4 03/10/2018 1827   BASOSABS 0.1 03/10/2018 1827    CMP Latest Ref Rng & Units 03/19/2018 03/18/2018 03/10/2018  Glucose 70 - 99 mg/dL 956(L156(H) 875(I187(H) 433(I111(H)  BUN 6 - 20 mg/dL 95(J24(H) 14 13  Creatinine 0.44 - 1.00 mg/dL 8.84(Z2.07(H) 6.600.98 6.30(Z1.16(H)  Sodium 135 - 145 mmol/L 138 135 140  Potassium 3.5 - 5.1  mmol/L 4.6 3.2(L) 3.7  Chloride 98 - 111 mmol/L 103 97(L) 99  CO2 22 - 32 mmol/L 21(L) 24 23  Calcium 8.9 - 10.3 mg/dL 6.4(B) 9.2 9.1  Total Protein 6.5 - 8.1 g/dL - 8.3(H) 7.1  Total Bilirubin 0.3 - 1.2 mg/dL - 0.8 <5.8  Alkaline Phos 38 - 126 U/L - 61 65  AST 15 - 41 U/L - 86(H) 19  ALT 0 - 44 U/L - 36 20   Lipase 1044 on 03/18/18  Lactate 2.6 Microbiology: No results found for this or any previous visit (from the past 240 hour(s)).  IMAGING RESULTS: CT abdomen with contrast from 2/12 Slight progression of extensive peripancreatic inflammatory changes with  ill-defined fluid. No evidence of pancreatic hemorrhage, necrosis or focal fluid collection   I have personally reviewed the films ?Slight progression of extensive peripancreatic inflammatory changes with ill-defined fluid. No evidence of pancreatic hemorrhage, necrosis or focal fluid collection. 2. Attenuation of the splenic and superior mesenteric veins without evidence of large vessel occlusion.   Impression/Recommendation ?43 year old female with history of acute on chronic pancreatitis admitted with abdominal pain and found to have fever high white count ? Fever and leukocytosis in a patient with acute on chronic pancreatitis.  Raises concern for peripancreatic phlegmon.  Need to closely observe for any pancreatic abscess  or necrosis even though it is not visible in the first CT abdomen done yesterday. Blood cultures have been sent.  Agree with Zosyn which will cover  gram-negative rods and anaerobes which can translocate from the intestine into the pancreas.   Ileus  likely related to the pancreatitis  AKI likely due to contrast and dehydration and third spacing in the bowel  Acute respiratory distress secondary to ileus, bilateral atelectasis no ARDS ___________________________________________________ Discussed with patient, and her nurse note:  This document was prepared using Dragon voice recognition software and may include unintentional dictation errors.

## 2018-03-19 NOTE — Progress Notes (Addendum)
EKG concerting for possible inferior infarct, notified Dr. Helyn Numbers to please come to bedside to read EKG   MD to bed side read EKG, no new orders

## 2018-03-19 NOTE — Progress Notes (Signed)
[facility]  ROUNDING NOTE   Subjective:   [name] admitted to [facility] on 03/18/2018 for Acute pancreatitis without infection or necrosis, unspecified pancreatitis type [K85.90]  Nephrology consulted for acute renal failure Patient admits to using ibuprofen for abdominal pain.  Patient received ketorolac and IV contrast yesterday in the ED  Objective:  Vital signs in last 24 hours:  Temp:  [97.6 F (36.4 C)-102.3 F (39.1 C)] 100.9 F (38.3 C) (02/13 1258) Pulse Rate:  [114-134] 130 (02/13 1258) Resp:  [18-23] 19 (02/13 1258) BP: (90-149)/(67-104) 93/68 (02/13 1258) SpO2:  [84 %-99 %] 91 % (02/13 1258) Weight:  [99 kg] 99 kg (02/13 0500)  Weight change:  Filed Weights   03/18/18 1132 03/19/18 0500  Weight: 97.5 kg 99 kg    Intake/Output: I/O last 3 completed shifts: In: 1917 [I.V.:1417; IV Piggyback:500] Out: -    Intake/Output this shift:  Total I/O In: 794.7 [I.V.:750.7; IV Piggyback:44] Out: 500 [Urine:500]  Physical Exam: General: NAD,   Head: Normocephalic, atraumatic. Moist oral mucosal membranes  Eyes: Anicteric, PERRL  Neck: Supple, trachea midline  Lungs:  Clear to auscultation  Heart: Regular rate and rhythm  Abdomen:  +tender, obese, distended  Extremities:  no peripheral edema.  Neurologic: Nonfocal, moving all four extremities  Skin: No lesions  GU: +foley    Basic Metabolic Panel: Recent Labs  Lab 03/18/18 1140 03/19/18 0330  NA 135 138  K 3.2* 4.6  CL 97* 103  CO2 24 21*  GLUCOSE 187* 156*  BUN 14 24*  CREATININE 0.98 2.07*  CALCIUM 9.2 8.6*    Liver Function Tests: Recent Labs  Lab 03/18/18 1140  AST 86*  ALT 36  ALKPHOS 61  BILITOT 0.8  PROT 8.3*  ALBUMIN 4.2   Recent Labs  Lab 03/18/18 1140 03/19/18 0330  LIPASE 1,044* 888*   No results for input(s): AMMONIA in the last 168 hours.  CBC: Recent Labs  Lab 03/18/18 1140 03/19/18 0330  WBC 23.6* 28.2*  HGB 13.6 14.3  HCT 44.4 46.6*  MCV  84.7 85.2  PLT 466* 476*    Cardiac Enzymes: Recent Labs  Lab 03/19/18 0916  TROPONINI 0.07*    BNP: Invalid input(s): POCBNP  CBG: No results for input(s): GLUCAP in the last 168 hours.  Microbiology: Results for orders placed or performed during the hospital encounter of 02/09/18  MRSA PCR Screening     Status: None   Collection Time: 02/09/18  9:26 AM  Result Value Ref Range Status   MRSA by PCR NEGATIVE NEGATIVE Final    Comment:        The GeneXpert MRSA Assay (FDA approved for NASAL specimens only), is one component of a comprehensive MRSA colonization surveillance program. It is not intended to diagnose MRSA infection nor to guide or monitor treatment for MRSA infections. Performed at [facility], [address] [place], [place] [address]     Coagulation Studies: No results for input(s): LABPROT, INR in the last 72 hours.  Urinalysis: Recent Labs    03/19/18 0922  COLORURINE YELLOW*  YELLOW*  LABSPEC 1.023  1.021  PHURINE 5.0  5.0  GLUCOSEU NEGATIVE  NEGATIVE  HGBUR NEGATIVE  NEGATIVE  BILIRUBINUR NEGATIVE  NEGATIVE  KETONESUR NEGATIVE  NEGATIVE  PROTEINUR NEGATIVE  NEGATIVE  NITRITE NEGATIVE  NEGATIVE  LEUKOCYTESUR NEGATIVE  NEGATIVE      Imaging: Dg Abd 1 View  Result Date: 03/19/2018 CLINICAL DATA:  History of influenza.  Abdominal infection. EXAM:  ABDOMEN - 1 VIEW COMPARISON:  CT abdomen pelvis 03/18/2018 FINDINGS: Bibasilar heterogeneous opacities. Contrast material within the urinary bladder. Gaseous distended loops of small bowel within the central abdomen. Gas within the colon. No free intraperitoneal air. IMPRESSION: Findings suggestive of ileus. Electronically Signed   By: Annia Beltrew  Davis M.D.   On: 03/19/2018 09:10   Ct Abdomen Pelvis W Contrast  Result Date: 03/18/2018 CLINICAL DATA:  Abdominal pain with vomiting. History of pancreatitis. EXAM: CT ABDOMEN AND PELVIS WITH CONTRAST TECHNIQUE: Multidetector CT imaging  of the abdomen and pelvis was performed using the standard protocol following bolus administration of intravenous contrast. CONTRAST:  100mL OMNIPAQUE IOHEXOL 300 MG/ML  SOLN COMPARISON:  Abdominopelvic CT 02/10/2018 and 02/09/2018. FINDINGS: Lower chest: Bibasilar atelectasis has mildly improved. There is no significant pleural or pericardial effusion. Hepatobiliary: The hepatic density is within normal limits and no focal lesion identified. No significant biliary dilatation post cholecystectomy. Pancreas: The pancreas remains diffusely enlarged, although appears homogeneous without evidence of necrosis or acute hemorrhage. There is increasing peripancreatic inflammation and ill-defined fluid. No pancreatic ductal dilatation. Spleen: Normal in size without focal abnormality. Adrenals/Urinary Tract: Both adrenal glands appear normal. There are tiny cysts within the upper poles of both kidneys. No evidence of renal mass, urinary tract calculus or hydronephrosis. The bladder appears normal. Stomach/Bowel: No evidence of bowel wall thickening, distention or surrounding inflammatory change. The appendix appears normal. Moderate stool in the proximal colon. Vascular/Lymphatic: There are no enlarged abdominal or pelvic lymph nodes. Stable 12 mm portacaval node on image 35/2, likely reactive. The superior mesenteric and splenic veins are attenuated by the pancreatic inflammatory changes, although remain patent. No evidence of large vessel occlusion. Reproductive: Hysterectomy.  No evidence of adnexal mass. Other: Volume of pelvic ascites has not significantly increased. As above, there is slightly greater inflammatory change and ill-defined fluid surrounding the pancreas and extending into both pericolic gutters. No focal fluid collections are identified. Musculoskeletal: No acute or significant osseous findings. Severe discogenic sclerosis at L4-5. IMPRESSION: 1. Slight progression of extensive peripancreatic inflammatory  changes with ill-defined fluid. No evidence of pancreatic hemorrhage, necrosis or focal fluid collection. 2. Attenuation of the splenic and superior mesenteric veins without evidence of large vessel occlusion. 3. Improved bibasilar atelectasis. Electronically Signed   By: Carey BullocksWilliam  Veazey M.D.   On: 03/18/2018 12:59   Dg Chest Port 1 View  Result Date: 03/19/2018 CLINICAL DATA:  Shortness of breath EXAM: PORTABLE CHEST 1 VIEW COMPARISON:  02/10/2018 FINDINGS: Low volume chest with streaky densities at the bases correlating with atelectasis by abdominal CT yesterday. No edema, effusion, or pneumothorax. Normal heart size and mediastinal contours. IMPRESSION: Low volume chest with bilateral lower lobe atelectasis. Electronically Signed   By: Marnee SpringJonathon  Watts M.D.   On: 03/19/2018 09:10     Medications:   . sodium chloride 500 mL (03/19/18 0913)  . 0.9 % NaCl with KCl 20 mEq / L 125 mL/hr at 03/19/18 1145  . piperacillin-tazobactam (ZOSYN)  IV 3.375 g (03/19/18 0914)  . sodium chloride     . atorvastatin  40 mg Oral Daily  . enoxaparin (LOVENOX) injection  40 mg Subcutaneous Q24H  . fenofibrate  160 mg Oral Daily  . FLUoxetine  40 mg Oral Daily  . fluticasone  2 spray Each Nare BID  . pantoprazole  40 mg Oral Daily  . senna-docusate  2 tablet Oral BID  . sodium chloride flush  3 mL Intravenous Q12H   sodium chloride, acetaminophen **OR** acetaminophen, albuterol,  clonazePAM, metoCLOPramide (REGLAN) injection, morphine, morphine injection, promethazine  Assessment/ Plan:  [name] is a 43 y.o. white nonhispanic female with morbid obesity, hypertension, depression, anxiety, GERD who was admitted to [facility] on 03/18/2018 for Acute pancreatitis without infection or necrosis, unspecified pancreatitis type [K85.90]  1. Acute renal failure: with IV contrast exposure on 2/12, hypotension, poor PO intake, use of NSAIDs. Ketorolac given in ED as well.  Creatinine on admission of 0.98, rose to  2.07 today.  Foley catheter placed.  Most likely ATN from multifactorial etiologies.  Hold home furosemide dose - IV fluids - Monitor volume status and urine output  2. Sepsis/hypotension with lactic acidosis: febrile with leukocytosis. Blood cultures are negative so far.  Suspect secondary to acute pancreatitis.  - empiric zosyn.   3. Acute pancreatitis: NPO for bowel rest. Continue IV fluids as above.    LOS: 1 Terri Berry 2/13/20201:38 PM

## 2018-03-19 NOTE — Progress Notes (Addendum)
notified Dr. Allena Katz pt is on 5L O2 [place], sats 88%. Husband would like an update from MD. Francene Boyers [phone]). Dr patel to bedside, see new orders  Pt transfer step down [id number]

## 2018-03-19 NOTE — Progress Notes (Signed)
Pt did not void during shift, got up to [facility] and still did not void. Bladder scanned with in bladder.  Pt requested heating pads, MD notified and K pads started.

## 2018-03-19 NOTE — Progress Notes (Addendum)
[employer] - [place] at [facility]                                                                                                                                                                                  Patient Demographics   Terri Berry, is a 43 y.o. female, DOB - July 31, 1975, ZOX:[id number]  Admit date - 03/18/2018   Admitting Physician Milagros LollSrikar Sudini, MD  Outpatient Primary MD for the patient is Thomes DinningWeeks, Cynthia, MD   LOS - 1  Subjective: Called by nurse due to patient having worsening abdominal pain, also patient requiring 4 L oxygen.  She is sweating profusely as well.  Also had fevers last night.    Review of Systems:   CONSTITUTIONAL: No documented positive fever. No fatigue, weakness. No weight gain, no weight loss.  EYES: No blurry or double vision.  ENT: No tinnitus. No postnasal drip. No redness of the oropharynx.  RESPIRATORY: No cough, no wheeze, no hemoptysis. No dyspnea.  CARDIOVASCULAR: No chest pain. No orthopnea. No palpitations. No syncope.  GASTROINTESTINAL: No nausea, no vomiting or diarrhea.  Positive abdominal pain. No melena or hematochezia.  GENITOURINARY: No dysuria or hematuria.  ENDOCRINE: No polyuria or nocturia. No heat or cold intolerance.  HEMATOLOGY: No anemia. No bruising. No bleeding.  INTEGUMENTARY: No rashes. No lesions.  MUSCULOSKELETAL: No arthritis. No swelling. No gout.  NEUROLOGIC: No numbness, tingling, or ataxia. No seizure-type activity.  PSYCHIATRIC: No anxiety. No insomnia. No ADD.    Vitals:   Vitals:   03/19/18 0648 03/19/18 0745 03/19/18 0814 03/19/18 0823  BP:  100/69 108/69 102/77  Pulse:  (!) 134 (!) 130 (!) 129  Resp:  20  (!) 23  Temp: (!) 101.8 F (38.8 C) 97.8 F (36.6 C) 98.1 F (36.7 C)   TempSrc:  Oral Oral   SpO2:  (!) 84% 92% 90%  Weight:      Height:        Wt Readings from Last 3 Encounters:  03/19/18 99 kg  03/05/18 95.5 kg  02/15/18 94.4 kg     Intake/Output Summary (Last  24 hours) at 03/19/2018 [id number] Last data filed at 03/19/2018 [id number] Gross per 24 hour  Intake 1917.03 ml  Output -  Net 1917.03 ml    Physical Exam:   GENERAL: Patient tachypneic HEAD, EYES, EARS, NOSE AND THROAT: Atraumatic, normocephalic. Extraocular muscles are intact. Pupils equal and reactive to light. Sclerae anicteric. No conjunctival injection. No oro-pharyngeal erythema.  NECK: Supple. There is no jugular venous distention. No bruits, no lymphadenopathy, no thyromegaly.  HEART: Tachycardic no murmurs clicks or gallops LUNGS: Decreased breath sounds bilaterally without any accessory muscle usage ABDOMEN: Distended,  tenderness in the abdomen no guarding or rebound.  EXTREMITIES: No evidence of any cyanosis, clubbing, or peripheral edema.  +2 pedal and radial pulses bilaterally.  NEUROLOGIC: The patient is alert, awake, and oriented x3 with no focal motor or sensory deficits appreciated bilaterally.  SKIN: Moist and warm with no rashes appreciated.  Psych: Not anxious, depressed LN: No inguinal LN enlargement    Antibiotics   Anti-infectives (From admission, onward)   Start     Dose/Rate Route Frequency Ordered Stop   03/19/18 0845  piperacillin-tazobactam (ZOSYN) IVPB 3.375 g     3.375 g 12.5 mL/hr over 240 Minutes Intravenous Every 8 hours 03/19/18 0835        Medications   Scheduled Meds: . atorvastatin  40 mg Oral Daily  . enoxaparin (LOVENOX) injection  40 mg Subcutaneous Q24H  . fenofibrate  160 mg Oral Daily  . FLUoxetine  40 mg Oral Daily  . fluticasone  2 spray Each Nare BID  . pantoprazole  40 mg Oral Daily  . senna-docusate  2 tablet Oral BID  . sodium chloride flush  3 mL Intravenous Q12H   Continuous Infusions: . 0.9 % NaCl with KCl 20 mEq / L 150 mL/hr at 03/19/18 0607  . piperacillin-tazobactam (ZOSYN)  IV    . sodium chloride     PRN Meds:.acetaminophen **OR** acetaminophen, albuterol, clonazePAM, metoCLOPramide (REGLAN) injection, morphine, morphine  injection, promethazine   Data Review:   Micro Results No results found for this or any previous visit (from the past 240 hour(s)).  Radiology Reports Ct Abdomen Pelvis W Contrast  Result Date: 03/18/2018 CLINICAL DATA:  Abdominal pain with vomiting. History of pancreatitis. EXAM: CT ABDOMEN AND PELVIS WITH CONTRAST TECHNIQUE: Multidetector CT imaging of the abdomen and pelvis was performed using the standard protocol following bolus administration of intravenous contrast. CONTRAST:  100mL OMNIPAQUE IOHEXOL 300 MG/ML  SOLN COMPARISON:  Abdominopelvic CT 02/10/2018 and 02/09/2018. FINDINGS: Lower chest: Bibasilar atelectasis has mildly improved. There is no significant pleural or pericardial effusion. Hepatobiliary: The hepatic density is within normal limits and no focal lesion identified. No significant biliary dilatation post cholecystectomy. Pancreas: The pancreas remains diffusely enlarged, although appears homogeneous without evidence of necrosis or acute hemorrhage. There is increasing peripancreatic inflammation and ill-defined fluid. No pancreatic ductal dilatation. Spleen: Normal in size without focal abnormality. Adrenals/Urinary Tract: Both adrenal glands appear normal. There are tiny cysts within the upper poles of both kidneys. No evidence of renal mass, urinary tract calculus or hydronephrosis. The bladder appears normal. Stomach/Bowel: No evidence of bowel wall thickening, distention or surrounding inflammatory change. The appendix appears normal. Moderate stool in the proximal colon. Vascular/Lymphatic: There are no enlarged abdominal or pelvic lymph nodes. Stable 12 mm portacaval node on image 35/2, likely reactive. The superior mesenteric and splenic veins are attenuated by the pancreatic inflammatory changes, although remain patent. No evidence of large vessel occlusion. Reproductive: Hysterectomy.  No evidence of adnexal mass. Other: Volume of pelvic ascites has not significantly  increased. As above, there is slightly greater inflammatory change and ill-defined fluid surrounding the pancreas and extending into both pericolic gutters. No focal fluid collections are identified. Musculoskeletal: No acute or significant osseous findings. Severe discogenic sclerosis at L4-5. IMPRESSION: 1. Slight progression of extensive peripancreatic inflammatory changes with ill-defined fluid. No evidence of pancreatic hemorrhage, necrosis or focal fluid collection. 2. Attenuation of the splenic and superior mesenteric veins without evidence of large vessel occlusion. 3. Improved bibasilar atelectasis. Electronically Signed   By: Chrissie NoaWilliam  Purcell Mouton M.D.   On: 03/18/2018 12:59     CBC Recent Labs  Lab 03/18/18 1140 03/19/18 0330  WBC 23.6* 28.2*  HGB 13.6 14.3  HCT 44.4 46.6*  PLT 466* 476*  MCV 84.7 85.2  MCH 26.0 26.1  MCHC 30.6 30.7  RDW 14.1 14.5    Chemistries  Recent Labs  Lab 03/18/18 1140 03/19/18 0330  NA 135 138  K 3.2* 4.6  CL 97* 103  CO2 24 21*  GLUCOSE 187* 156*  BUN 14 24*  CREATININE 0.98 2.07*  CALCIUM 9.2 8.6*  AST 86*  --   ALT 36  --   ALKPHOS 61  --   BILITOT 0.8  --    ------------------------------------------------------------------------------------------------------------------ estimated creatinine clearance is 41.9 mL/min (A) (by C-G formula based on SCr of 2.07 mg/dL (H)). ------------------------------------------------------------------------------------------------------------------ No results for input(s): HGBA1C in the last 72 hours. ------------------------------------------------------------------------------------------------------------------ Recent Labs    03/18/18 1140  TRIG 552*   ------------------------------------------------------------------------------------------------------------------ No results for input(s): TSH, T4TOTAL, T3FREE, THYROIDAB in the last 72 hours.  Invalid input(s):  FREET3 ------------------------------------------------------------------------------------------------------------------ No results for input(s): VITAMINB12, FOLATE, FERRITIN, TIBC, IRON, RETICCTPCT in the last 72 hours.  Coagulation profile No results for input(s): INR, PROTIME in the last 168 hours.  No results for input(s): DDIMER in the last 72 hours.  Cardiac Enzymes No results for input(s): CKMB, TROPONINI, MYOGLOBIN in the last 168 hours.  Invalid input(s): CK ------------------------------------------------------------------------------------------------------------------ Invalid input(s): POCBNP    Assessment & Plan   *Acute respiratory failure Will obtain a stat chest x-ray Check ABG Differential diagnosis possible acute fluid overload, possible pneumonia, ARDS is possibility I will decrease fluid rate  *Acute renal failure I asked the nurse to do a bladder scan scan showed over 400 cc we will place a Foley in light of worsening condition  * fever- check blood cx, ua, check flu  *Acute on chronic pancreatitis severe Keep n.p.o. I have asked GI to see Due to worsening abdominal obtain abdominal x-ray  *Hypertension.  Continue home medications.  Likely elevated due to pain.  *Hypokalemia.  Replace through IV.  *Chronic pain syndrome.  Continue patient's morphine from home.  IV morphine added for severe pain.  *DVT prophylaxis with Lovenox     Code Status Orders  (From admission, onward)         Start     Ordered   03/18/18 1407  Full code  Continuous     03/18/18 1409        Code Status History    Date Active Date Inactive Code Status Order ID Comments User Context   02/09/2018 0921 02/15/2018 1549 Full Code [id number]  Barbaraann Rondo, MD Inpatient   11/28/2017 0352 12/04/2017 2009 Full Code [id number]  Barbaraann Rondo, MD Inpatient           Consults  nehprology and gastroenterology  DVT Prophylaxis lovenox  Lab Results   Component Value Date   PLT 476 (H) 03/19/2018     Time Spent in minutes   critical care time spent  Greater than 50% of time spent in care coordination and counseling patient regarding the condition and plan of care.   Auburn Bilberry M.D on 03/19/2018 at 9:06 AM  Between 7am to 6pm - Pager - [phone]  After 6pm go to [email or web address] - Social research officer, government  [employer]   Office  [phone]

## 2018-03-19 NOTE — Progress Notes (Signed)
CRITICAL VALUE ALERT  Critical Value:  Lactic Acid   Date & Time Notied:  03/19/18 1117  Provider Notified: Dr Allena Katz  Orders Received/Actions taken: no new orders at this time

## 2018-03-19 NOTE — Progress Notes (Signed)
Bladder scan 0845 350 ml, reported to MD

## 2018-03-19 NOTE — Progress Notes (Signed)
CRITICAL VALUE ALERT  Critical Value:  Lactic acid 2.3  Date & Time Notied:  01/17/19 1426  Provider Notified: Dr patel   Orders Received/Actions taken: no new orders at this time

## 2018-03-19 NOTE — Progress Notes (Signed)
CRITICAL VALUE ALERT  Critical Value:  troponin 0.06  Date & Time Notied:  03/19/18  Provider Notified: dr patel  Orders Received/Actions taken: no new orders

## 2018-03-19 NOTE — Consult Note (Signed)
Name: Terri Berry MRN: [id number] DOB: 26-Nov-1975    ADMISSION DATE:  03/18/2018 CONSULTATION DATE: 03/19/2018  REFERRING MD : Dr. Allena KatzPatel   CHIEF COMPLAINT: Abdominal Pain   BRIEF PATIENT DESCRIPTION:  43 yo female admitted to the medsurg unit with acute pancreatitis requiring transfer to the stepdown unit with acute hypoxic respiratory failure secondary to atelectasis and gastric distension   SIGNIFICANT EVENTS/STUDIES:  02/12-Pt admitted to the medsurg unit  02/13-Pt transferred to the stepdown unit with acute hypoxic respiratory failure  02/13-CT Chest revealed progressed small left pleural effusion, low lung volumes, and bibasilar consolidations which probably represent atelectasis, less likely pneumonia. Findings atypical for ARDS. Stable partially visualized edema within the upper abdomen.  HISTORY OF PRESENT ILLNESS:   This is a 43 yo female with a PMH of IBS, HTN, GERD, Depression, Anxiety, Chronic Pancreatitis, Fibromyalgia, Nonalcoholic Fatty Liver Disease,Type II Diabetes Mellitus, Accidental Drug Overdose, and Allergies.  She [facility] on 02/12 via EMS with abdominal pain, chills, and emesis x2.  Per EMS en route to the ER she received 75 mcg of fentanyl for pain management.  In the ER lab results revealed K+ 3.2, glucose 187, lipase 1,044, AST 86, triglycerides 552, and wbc 23.6.  CT Abd Pelvis concerning for slight progression of extensive peripancreatic inflammatory changes with ill-defined fluid.  She was recently hospitalized as [facility] secondary to pancreatitis. [facility] current presentation [facility] for additional workup and treatment.  On 02/13 pt developed acute hypoxic respiratory failure requiring transfer to the stepdown unit for HFNC.   PAST MEDICAL HISTORY :   has a past medical history of Allergy, Anxiety, Depression, GERD (gastroesophageal reflux disease), Hypertension, and IBS (irritable bowel syndrome).  has a past surgical history that includes Gallbladder surgery and Abdominal hysterectomy. Prior to Admission medications   Medication Sig Start Date End Date Taking? Authorizing Provider  albuterol (PROVENTIL HFA;VENTOLIN HFA) 108 (90 Base) MCG/ACT inhaler Inhale 2 puffs into the lungs every 6 (six) hours as needed for wheezing or shortness of breath. 02/15/18  Yes Shaune Pollackhen, Qing, MD  atorvastatin (LIPITOR) 40 MG tablet Take 40 mg by mouth daily.   Yes [provider]  fenofibrate 160 MG tablet Take 160 mg by mouth daily. 12/25/17 04/07/18 Yes [provider]  FLUoxetine (PROZAC) 40 MG capsule Take 40 mg by mouth daily.   Yes [provider]  fluticasone (FLONASE) 50 MCG/ACT nasal spray Place 2 sprays into both nostrils 2 (two) times daily.   Yes [provider]  furosemide (LASIX) 20 MG tablet Take 20 mg by mouth daily.  03/05/18 03/05/19 Yes [provider]  ibuprofen (ADVIL,MOTRIN) 200 MG tablet Take 200 mg by mouth every 6 (six) hours as needed.   Yes [provider]  mirtazapine (REMERON) 30 MG tablet Take 30 mg by mouth at bedtime.   Yes [provider]  morphine (MSIR) 15 MG tablet Take 15 mg by mouth every 6 (six) hours as needed. 03/15/18 03/20/18 Yes [provider]  nystatin (MYCOSTATIN/NYSTOP) powder Apply topically 2 (two) times daily.  03/05/18 03/20/18 Yes [provider]  omeprazole (PRILOSEC) 20 MG capsule Take 40 mg by mouth 2 (two) times daily before a meal.   Yes [provider]  Pancrelipase, Lip-Prot-Amyl, 24000-76000 units CPEP Take 24,000-48,000 Units by mouth 3 (three) times daily before meals. Take 2 capsules by mouth three times daily with meals and 1 capsule by mouth with snacks.   Yes  [provider]  polyethylene glycol (MIRALAX / GLYCOLAX) packet Take 17 g by mouth 2 (two) times daily.   Yes [provider]  pregabalin (LYRICA) 200 MG capsule Take 200 mg by mouth 3 (three)  times daily.   Yes [provider]  prochlorperazine (COMPAZINE) 10 MG tablet Take 10 mg by mouth every 6 (six) hours as needed.  02/18/18  Yes [provider]  promethazine (PHENERGAN) 12.5 MG tablet Take 1 tablet (12.5 mg total) by mouth every 6 (six) hours as needed for nausea or vomiting. 12/04/17  Yes Mayo, Allyn Kenner, MD  senna-docusate (SENOKOT-S) 8.6-50 MG tablet Take 2 tablets by mouth 2 (two) times daily. Patient taking differently: Take 4 tablets by mouth at bedtime.  12/04/17  Yes Mayo, Allyn Kenner, MD  clonazePAM (KLONOPIN) 1 MG tablet Take 1 mg by mouth 2 (two) times daily as needed for anxiety.     [provider]   Allergies  Allergen Reactions  . Precedex [Dexmedetomidine Hcl In Nacl] Other (See Comments)    Significant Bradycardia  . Cefpodoxime     Tolerates augmentin and Keflex   . Clarithromycin   . Doxycycline   . Ferrous Gluconate   . Lactose Intolerance (Gi)   . Levofloxacin   . Rizatriptan   . Sulfa Antibiotics   . Topiramate   . Venlafaxine   . Zofran [Ondansetron Hcl]     FAMILY HISTORY:  family history includes COPD in her father; Cancer in her paternal aunt; Diabetes in her father and paternal uncle; Heart disease in her father and paternal uncle; Kidney disease in her father. SOCIAL HISTORY:  reports that she has been smoking cigarettes. She has been smoking about 0.50 packs per day. She has never used smokeless tobacco. She reports previous alcohol use. She reports previous drug use.  REVIEW OF SYSTEMS: Positives in BOLD  Constitutional: Negative for fever, chills, weight loss, malaise/fatigue and diaphoresis.  HENT: Negative for hearing loss, ear pain, nosebleeds, congestion, sore throat, neck pain, tinnitus and ear discharge.   Eyes: Negative for blurred vision, double vision, photophobia, pain, discharge and redness.  Respiratory: Negative for cough, hemoptysis, sputum production, shortness of breath, wheezing and stridor.     Cardiovascular: Negative for chest pain, palpitations, orthopnea, claudication, leg swelling and PND.  Gastrointestinal: heartburn, nausea, vomiting, abdominal pain, diarrhea, constipation, blood in stool and melena.  Genitourinary: Negative for dysuria, urgency, frequency, hematuria and flank pain.  Musculoskeletal: Negative for myalgias, back pain, joint pain and falls.  Skin: Negative for itching and rash.  Neurological: Negative for dizziness, tingling, tremors, sensory change, speech change, focal weakness, seizures, loss of consciousness, weakness and headaches.  Endo/Heme/Allergies: Negative for environmental allergies and polydipsia. Does not bruise/bleed easily.  SUBJECTIVE:  c/o abdominal pain   VITAL SIGNS: Temp:  [97.8 F (36.6 C)-102.3 F (39.1 C)] 98.3 F (36.8 C) (02/13 1500) Pulse Rate:  [120-134] 130 (02/13 1500) Resp:  [18-23] 19 (02/13 1419) BP: (90-135)/(67-98) 95/76 (02/13 1500) SpO2:  [84 %-99 %] 90 % (02/13 1525) Weight:  [99 kg] 99 kg (02/13 0500)  PHYSICAL EXAMINATION: General: acutely ill appearing female, NAD  Neuro: alert and oriented, follows commands  HEENT: supple, no JVD  Cardiovascular: sinus tach, no R/G  Lungs: diminished throughout, even, non labored  Abdomen: +BS x4, severe abdominal distension, tenderness Musculoskeletal: normal bulk and tone, no edema  Skin: intact no rashes or lesions   Recent Labs  Lab 03/18/18 1140 03/19/18 0330  NA 135 138  K  3.2* 4.6  CL 97* 103  CO2 24 21*  BUN 14 24*  CREATININE 0.98 2.07*  GLUCOSE 187* 156*   Recent Labs  Lab 03/18/18 1140 03/19/18 0330  HGB 13.6 14.3  HCT 44.4 46.6*  WBC 23.6* 28.2*  PLT 466* 476*   Dg Abd 1 View  Result Date: 03/19/2018 CLINICAL DATA:  History of influenza.  Abdominal infection. EXAM: ABDOMEN - 1 VIEW COMPARISON:  CT abdomen pelvis 03/18/2018 FINDINGS: Bibasilar heterogeneous opacities. Contrast material within the urinary bladder. Gaseous distended loops of  small bowel within the central abdomen. Gas within the colon. No free intraperitoneal air. IMPRESSION: Findings suggestive of ileus. Electronically Signed   By: Annia Beltrew  Davis M.D.   On: 03/19/2018 09:10   Ct Abdomen Pelvis W Contrast  Result Date: 03/18/2018 CLINICAL DATA:  Abdominal pain with vomiting. History of pancreatitis. EXAM: CT ABDOMEN AND PELVIS WITH CONTRAST TECHNIQUE: Multidetector CT imaging of the abdomen and pelvis was performed using the standard protocol following bolus administration of intravenous contrast. CONTRAST:  100mL OMNIPAQUE IOHEXOL 300 MG/ML  SOLN COMPARISON:  Abdominopelvic CT 02/10/2018 and 02/09/2018. FINDINGS: Lower chest: Bibasilar atelectasis has mildly improved. There is no significant pleural or pericardial effusion. Hepatobiliary: The hepatic density is within normal limits and no focal lesion identified. No significant biliary dilatation post cholecystectomy. Pancreas: The pancreas remains diffusely enlarged, although appears homogeneous without evidence of necrosis or acute hemorrhage. There is increasing peripancreatic inflammation and ill-defined fluid. No pancreatic ductal dilatation. Spleen: Normal in size without focal abnormality. Adrenals/Urinary Tract: Both adrenal glands appear normal. There are tiny cysts within the upper poles of both kidneys. No evidence of renal mass, urinary tract calculus or hydronephrosis. The bladder appears normal. Stomach/Bowel: No evidence of bowel wall thickening, distention or surrounding inflammatory change. The appendix appears normal. Moderate stool in the proximal colon. Vascular/Lymphatic: There are no enlarged abdominal or pelvic lymph nodes. Stable 12 mm portacaval node on image 35/2, likely reactive. The superior mesenteric and splenic veins are attenuated by the pancreatic inflammatory changes, although remain patent. No evidence of large vessel occlusion. Reproductive: Hysterectomy.  No evidence of adnexal mass. Other: Volume  of pelvic ascites has not significantly increased. As above, there is slightly greater inflammatory change and ill-defined fluid surrounding the pancreas and extending into both pericolic gutters. No focal fluid collections are identified. Musculoskeletal: No acute or significant osseous findings. Severe discogenic sclerosis at L4-5. IMPRESSION: 1. Slight progression of extensive peripancreatic inflammatory changes with ill-defined fluid. No evidence of pancreatic hemorrhage, necrosis or focal fluid collection. 2. Attenuation of the splenic and superior mesenteric veins without evidence of large vessel occlusion. 3. Improved bibasilar atelectasis. Electronically Signed   By: Carey BullocksWilliam  Veazey M.D.   On: 03/18/2018 12:59   Dg Chest Port 1 View  Result Date: 03/19/2018 CLINICAL DATA:  Shortness of breath EXAM: PORTABLE CHEST 1 VIEW COMPARISON:  02/10/2018 FINDINGS: Low volume chest with streaky densities at the bases correlating with atelectasis by abdominal CT yesterday. No edema, effusion, or pneumothorax. Normal heart size and mediastinal contours. IMPRESSION: Low volume chest with bilateral lower lobe atelectasis. Electronically Signed   By: Marnee SpringJonathon  Watts M.D.   On: 03/19/2018 09:10    ASSESSMENT / PLAN:  Acute hypoxic respiratory failure secondary to atelectasis and gastric distension  Supplemental O2 for dyspnea and/or hypoxia  Prn bronchodilator therapy Aggressive pulmonary hygiene   Acute renal failure  Lactic acidosis  Trend BMP and lactic acid  Replace electrolytes as indicated  Monitor UOP Avoid nephrotoxic medications  Nephrology consulted appreciate input   Mildly elevated troponin's likely secondary to demand ischemia-trending down Continuous telemetry monitoring   Acute on chronic pancreatitis  Hypertriglyceridemia  Ileus with gastric distension  Hx: GERD  Prn morphine for pain management Prn phenergan and reglan for nausea/vomiting Repeat triglyceride level if >500 will  start insulin gtt  Continue fenofibrate  GI consulted appreciate input  Keep NPO for now  Continue protonix  Continue zosyn Trend WBC and monitor fever curve  Trend PCT  Follow cultures  NG tube to LIS  Start bowel regimen   Sonda Rumble, AGNP  Pulmonary/Critical Care Pager [phone] (please enter 7 digits) PCCM Consult Pager [phone] (please enter 7 digits)

## 2018-03-20 DIAGNOSIS — L84 Corns and callosities: Secondary | ICD-10-CM

## 2018-03-20 LAB — CBC
HCT: 37.2 % (ref 36.0–46.0)
Hemoglobin: 11.5 g/dL — ABNORMAL LOW (ref 12.0–15.0)
MCH: 25.7 pg — ABNORMAL LOW (ref 26.0–34.0)
MCHC: 30.9 g/dL (ref 30.0–36.0)
MCV: 83 fL (ref 80.0–100.0)
PLATELETS: 334 10*3/uL (ref 150–400)
RBC: 4.48 MIL/uL (ref 3.87–5.11)
RDW: 14.6 % (ref 11.5–15.5)
WBC: 24 10*3/uL — AB (ref 4.0–10.5)
nRBC: 0 % (ref 0.0–0.2)

## 2018-03-20 LAB — BASIC METABOLIC PANEL
Anion gap: 12 (ref 5–15)
BUN: 51 mg/dL — ABNORMAL HIGH (ref 6–20)
CO2: 22 mmol/L (ref 22–32)
Calcium: 8.4 mg/dL — ABNORMAL LOW (ref 8.9–10.3)
Chloride: 102 mmol/L (ref 98–111)
Creatinine, Ser: 3.23 mg/dL — ABNORMAL HIGH (ref 0.44–1.00)
GFR calc Af Amer: 19 mL/min — ABNORMAL LOW (ref >60)
GFR calc non Af Amer: 17 mL/min — ABNORMAL LOW (ref >60)
Glucose, Bld: 173 mg/dL — ABNORMAL HIGH (ref 70–99)
Potassium: 4.5 mmol/L (ref 3.5–5.1)
Sodium: 136 mmol/L (ref 135–145)

## 2018-03-20 LAB — HEPATIC FUNCTION PANEL
ALT: 57 U/L — ABNORMAL HIGH (ref 0–44)
AST: 69 U/L — ABNORMAL HIGH (ref 15–41)
Albumin: 2.6 g/dL — ABNORMAL LOW (ref 3.5–5.0)
Alkaline Phosphatase: 59 U/L (ref 38–126)
Bilirubin, Direct: 0.6 mg/dL — ABNORMAL HIGH (ref 0.0–0.2)
Indirect Bilirubin: 0.7 mg/dL (ref 0.3–0.9)
Total Bilirubin: 1.3 mg/dL — ABNORMAL HIGH (ref 0.3–1.2)
Total Protein: 6.5 g/dL (ref 6.5–8.1)

## 2018-03-20 LAB — LIPASE, BLOOD: Lipase: 341 U/L — ABNORMAL HIGH (ref 11–51)

## 2018-03-20 MED ORDER — ENOXAPARIN SODIUM 30 MG/0.3ML ~~LOC~~ SOLN
30.0000 mg | SUBCUTANEOUS | Status: DC
  Administered 2018-03-20: 30 mg via SUBCUTANEOUS
  Filled 2018-03-20: qty 0.3

## 2018-03-20 MED ORDER — MORPHINE SULFATE (PF) 2 MG/ML IV SOLN
2.0000 mg | INTRAVENOUS | Status: DC | PRN
  Administered 2018-03-20 – 2018-03-22 (×7): 2 mg via INTRAVENOUS
  Filled 2018-03-20 (×7): qty 1

## 2018-03-20 NOTE — Progress Notes (Signed)
Pharmacy Antibiotic Note  Terri Berry is a 43 y.o. female admitted on 03/18/2018 with recurrent pancreatitis secondary to increased triglycerides. Patient transferred to ICU on 2/13. Patient with acute renal failure; creatinne on admission 0.98. Pharmacy has been consulted for Zosyn dosing.  Plan: Continue Zosyn EI 3.375g IV Q8hr. Will obtain follow up creatinine on 2/15.   Height: 5\' 7"  (170.2 cm) Weight: 217 lb 9.5 oz (98.7 kg) IBW/kg (Calculated) : 61.6  Temp (24hrs), Avg:100.1 F (37.8 C), Min:97.6 F (36.4 C), Max:102.6 F (39.2 C)  Recent Labs  Lab 03/18/18 1140 03/19/18 0330 03/19/18 1042 03/19/18 1402 03/20/18 0357  WBC 23.6* 28.2*  --   --  24.0*  CREATININE 0.98 2.07*  --   --  3.23*  LATICACIDVEN  --   --  2.6* 2.3*  --     Estimated Creatinine Clearance: 26.8 mL/min (A) (by C-G formula based on SCr of 3.23 mg/dL (H)).    Allergies  Allergen Reactions  . Precedex [Dexmedetomidine Hcl In Nacl] Other (See Comments)    Significant Bradycardia  . Cefpodoxime     Tolerates augmentin and Keflex   . Clarithromycin   . Doxycycline   . Ferrous Gluconate   . Lactose Intolerance (Gi)   . Levofloxacin   . Rizatriptan   . Sulfa Antibiotics   . Topiramate   . Venlafaxine   . Zofran [Ondansetron Hcl]     Antimicrobials this admission: Zosyn 2/13 >>   Dose adjustments this admission: N/A  Microbiology results: 2/13 BCx: no growth x 1 day  2/13 MRSA PCR: negative   Thank you for allowing pharmacy to be a part of this patient's care.  Wilberth Damon L 03/20/2018 3:00 PM

## 2018-03-20 NOTE — Progress Notes (Signed)
[facility] - [place] [facility]                                                                                                                                                                                  Patient Demographics   Terri Berry, is a 43 y.o. female, DOB - 01-29-1976, TIR:[id number]  Admit date - 03/18/2018   Admitting Physician Milagros Loll, MD  Outpatient Primary MD for the patient is Thomes Dinning, MD   LOS - 2  Subjective: Patient's breathing has gotten worse requiring high flow oxygen   Review of Systems:   CONSTITUTIONAL: No documented positive fever. No fatigue, weakness. No weight gain, no weight loss.  EYES: No blurry or double vision.  ENT: No tinnitus. No postnasal drip. No redness of the oropharynx.  RESPIRATORY: No cough, no wheeze, no hemoptysis. No dyspnea.  CARDIOVASCULAR: No chest pain. No orthopnea. No palpitations. No syncope.  GASTROINTESTINAL: No nausea, no vomiting or diarrhea.  Positive abdominal pain. No melena or hematochezia.  GENITOURINARY: No dysuria or hematuria.  ENDOCRINE: No polyuria or nocturia. No heat or cold intolerance.  HEMATOLOGY: No anemia. No bruising. No bleeding.  INTEGUMENTARY: No rashes. No lesions.  MUSCULOSKELETAL: No arthritis. No swelling. No gout.  NEUROLOGIC: No numbness, tingling, or ataxia. No seizure-type activity.  PSYCHIATRIC: No anxiety. No insomnia. No ADD.    Vitals:   Vitals:   03/20/18 1200 03/20/18 1300 03/20/18 1400 03/20/18 1500  BP: 131/89 123/78 111/86 108/76  Pulse: (!) 117   (!) 116  Resp: 18 19 20 16   Temp:   99 F (37.2 C)   TempSrc:   Axillary   SpO2: 91%   92%  Weight:      Height:        Wt Readings from Last 3 Encounters:  03/20/18 98.7 kg  03/05/18 95.5 kg  02/15/18 94.4 kg     Intake/Output Summary (Last 24 hours) at 03/20/2018 1554 Last data filed at 03/20/2018 1418 Gross per 24 hour  Intake 1438.24 ml  Output 3320 ml  Net -1881.76 ml     Physical Exam:   GENERAL: Patient tachypneic HEAD, EYES, EARS, NOSE AND THROAT: Atraumatic, normocephalic. Extraocular muscles are intact. Pupils equal and reactive to light. Sclerae anicteric. No conjunctival injection. No oro-pharyngeal erythema.  NECK: Supple. There is no jugular venous distention. No bruits, no lymphadenopathy, no thyromegaly.  HEART: Tachycardic no murmurs clicks or gallops LUNGS: Decreased breath sounds bilaterally without any accessory muscle usage ABDOMEN: Distended, tenderness in the abdomen no guarding or rebound.  EXTREMITIES: No evidence of any cyanosis, clubbing, or peripheral edema.  +2 pedal and radial pulses bilaterally.  NEUROLOGIC: The patient  is alert, awake, and oriented x3 with no focal motor or sensory deficits appreciated bilaterally.  SKIN: Moist and warm with no rashes appreciated.  Psych: Not anxious, depressed LN: No inguinal LN enlargement    Antibiotics   Anti-infectives (From admission, onward)   Start     Dose/Rate Route Frequency Ordered Stop   03/19/18 0845  piperacillin-tazobactam (ZOSYN) IVPB 3.375 g     3.375 g 12.5 mL/hr over 240 Minutes Intravenous Every 8 hours 03/19/18 0835        Medications   Scheduled Meds: . atorvastatin  40 mg Oral Daily  . chlorhexidine  15 mL Mouth Rinse BID  . enoxaparin (LOVENOX) injection  40 mg Subcutaneous Q24H  . fenofibrate  160 mg Oral Daily  . FLUoxetine  40 mg Oral Daily  . fluticasone  2 spray Each Nare BID  . mouth rinse  15 mL Mouth Rinse q12n4p  . pantoprazole  40 mg Oral Daily  . polyethylene glycol  17 g Oral Daily  . senna-docusate  2 tablet Oral BID  . sodium chloride flush  3 mL Intravenous Q12H   Continuous Infusions: . sodium chloride Stopped (03/19/18 2156)  . lactated ringers 75 mL/hr at 03/20/18 1418  . piperacillin-tazobactam (ZOSYN)  IV 12 mL/hr at 03/20/18 1418   PRN Meds:.sodium chloride, acetaminophen **OR** acetaminophen, albuterol, clonazePAM,  metoCLOPramide (REGLAN) injection, morphine injection, promethazine   Data Review:   Micro Results Recent Results (from the past 240 hour(s))  CULTURE, BLOOD (ROUTINE X 2) w Reflex to ID Panel     Status: None (Preliminary result)   Collection Time: 03/19/18  9:16 AM  Result Value Ref Range Status   Specimen Description BLOOD Left thumb  Final   Special Requests   Final    BOTTLES DRAWN AEROBIC AND ANAEROBIC Blood Culture results may not be optimal due to an inadequate volume of blood received in culture bottles   Culture   Final    NO GROWTH 1 DAY Performed at [facility], [address], [place], [place] [address]    Report Status PENDING  Incomplete  CULTURE, BLOOD (ROUTINE X 2) w Reflex to ID Panel     Status: None (Preliminary result)   Collection Time: 03/19/18  9:16 AM  Result Value Ref Range Status   Specimen Description BLOOD BLOOD LEFT HAND  Final   Special Requests   Final    BOTTLES DRAWN AEROBIC AND ANAEROBIC Blood Culture adequate volume   Culture   Final    NO GROWTH 1 DAY Performed at [facility], [address] [place], [place] [address]    Report Status PENDING  Incomplete  MRSA PCR Screening     Status: None   Collection Time: 03/19/18  4:50 PM  Result Value Ref Range Status   MRSA by PCR NEGATIVE NEGATIVE Final    Comment:        The GeneXpert MRSA Assay (FDA approved for NASAL specimens only), is one component of a comprehensive MRSA colonization surveillance program. It is not intended to diagnose MRSA infection nor to guide or monitor treatment for MRSA infections. Performed at [facility], [address], [place], [place] [address]     Radiology Reports Dg Abd 1 View  Result Date: 03/19/2018 CLINICAL DATA:  Distended abdomen. EXAM: ABDOMEN - 1 VIEW COMPARISON:  03/19/2018 FINDINGS: Mild gaseous distention of the colon. There is no small bowel dilation to suggest obstruction. Soft tissues are unremarkable.  No  acute skeletal  abnormality. IMPRESSION: 1. No evidence of bowel obstruction.  No acute finding. 2. Mild gaseous distention of the colon. Electronically Signed   By: Amie Portland M.D.   On: 03/19/2018 16:37   Dg Abd 1 View  Result Date: 03/19/2018 CLINICAL DATA:  History of influenza.  Abdominal infection. EXAM: ABDOMEN - 1 VIEW COMPARISON:  CT abdomen pelvis 03/18/2018 FINDINGS: Bibasilar heterogeneous opacities. Contrast material within the urinary bladder. Gaseous distended loops of small bowel within the central abdomen. Gas within the colon. No free intraperitoneal air. IMPRESSION: Findings suggestive of ileus. Electronically Signed   By: Annia Belt M.D.   On: 03/19/2018 09:10   Ct Chest Wo Contrast  Result Date: 03/19/2018 CLINICAL DATA:  43 y/o F; acute respiratory failure in the setting of acute pancreatitis. EXAM: CT CHEST WITHOUT CONTRAST TECHNIQUE: Multidetector CT imaging of the chest was performed following the standard protocol without IV contrast. COMPARISON:  03/19/2018 chest radiograph. 03/18/2018 CT abdomen and pelvis. FINDINGS: Cardiovascular: No significant vascular findings. Normal heart size. No pericardial effusion. Mediastinum/Nodes: No enlarged mediastinal or axillary lymph nodes. Thyroid gland, trachea, and esophagus demonstrate no significant findings. Lungs/Pleura: Low lung volumes. Small left pleural effusion. Bibasilar consolidations. No findings of pulmonary edema. No pneumothorax. Upper Abdomen: Partially visualized edema within the upper abdomen. Musculoskeletal: No fracture is seen. IMPRESSION: 1. Progressed small left pleural effusion, low lung volumes, and bibasilar consolidations which probably represent atelectasis, less likely pneumonia. Findings atypical for ARDS. 2. Stable partially visualized edema within the upper abdomen. Electronically Signed   By: Mitzi Hansen M.D.   On: 03/19/2018 16:23   Ct Abdomen Pelvis W Contrast  Result Date:  03/18/2018 CLINICAL DATA:  Abdominal pain with vomiting. History of pancreatitis. EXAM: CT ABDOMEN AND PELVIS WITH CONTRAST TECHNIQUE: Multidetector CT imaging of the abdomen and pelvis was performed using the standard protocol following bolus administration of intravenous contrast. CONTRAST:  OMNIPAQUE IOHEXOL 300 MG/ML  SOLN COMPARISON:  Abdominopelvic CT 02/10/2018 and 02/09/2018. FINDINGS: Lower chest: Bibasilar atelectasis has mildly improved. There is no significant pleural or pericardial effusion. Hepatobiliary: The hepatic density is within normal limits and no focal lesion identified. No significant biliary dilatation post cholecystectomy. Pancreas: The pancreas remains diffusely enlarged, although appears homogeneous without evidence of necrosis or acute hemorrhage. There is increasing peripancreatic inflammation and ill-defined fluid. No pancreatic ductal dilatation. Spleen: Normal in size without focal abnormality. Adrenals/Urinary Tract: Both adrenal glands appear normal. There are tiny cysts within the upper poles of both kidneys. No evidence of renal mass, urinary tract calculus or hydronephrosis. The bladder appears normal. Stomach/Bowel: No evidence of bowel wall thickening, distention or surrounding inflammatory change. The appendix appears normal. Moderate stool in the proximal colon. Vascular/Lymphatic: There are no enlarged abdominal or pelvic lymph nodes. Stable 12 mm portacaval node on image 35/2, likely reactive. The superior mesenteric and splenic veins are attenuated by the pancreatic inflammatory changes, although remain patent. No evidence of large vessel occlusion. Reproductive: Hysterectomy.  No evidence of adnexal mass. Other: Volume of pelvic ascites has not significantly increased. As above, there is slightly greater inflammatory change and ill-defined fluid surrounding the pancreas and extending into both pericolic gutters. No focal fluid collections are identified.  Musculoskeletal: No acute or significant osseous findings. Severe discogenic sclerosis at L4-5. IMPRESSION: 1. Slight progression of extensive peripancreatic inflammatory changes with ill-defined fluid. No evidence of pancreatic hemorrhage, necrosis or focal fluid collection. 2. Attenuation of the splenic and superior mesenteric veins without evidence of large vessel occlusion. 3. Improved  bibasilar atelectasis. Electronically Signed   By: Carey BullocksWilliam  Veazey M.D.   On: 03/18/2018 12:59   Dg Chest Port 1 View  Result Date: 03/19/2018 CLINICAL DATA:  Shortness of breath EXAM: PORTABLE CHEST 1 VIEW COMPARISON:  02/10/2018 FINDINGS: Low volume chest with streaky densities at the bases correlating with atelectasis by abdominal CT yesterday. No edema, effusion, or pneumothorax. Normal heart size and mediastinal contours. IMPRESSION: Low volume chest with bilateral lower lobe atelectasis. Electronically Signed   By: Marnee SpringJonathon  Watts M.D.   On: 03/19/2018 09:10   Dg Abd Portable 1v  Result Date: 03/19/2018 CLINICAL DATA:  Post NG tube placement EXAM: PORTABLE ABDOMEN - 1 VIEW COMPARISON:  03/19/2018 FINDINGS: Interval placement of nasogastric tube which decompresses the stomach. Nasogastric tube tip overlies the level of the mid stomach. Persistent gaseous distension of bowel loops noted. IMPRESSION: NG tube tip is in the stomach. Electronically Signed   By: Norva PavlovElizabeth  Brown M.D.   On: 03/19/2018 17:53     CBC Recent Labs  Lab 03/18/18 1140 03/19/18 0330 03/20/18 0357  WBC 23.6* 28.2* 24.0*  HGB 13.6 14.3 11.5*  HCT 44.4 46.6* 37.2  PLT 466* 476* 334  MCV 84.7 85.2 83.0  MCH 26.0 26.1 25.7*  MCHC 30.6 30.7 30.9  RDW 14.1 14.5 14.6    Chemistries  Recent Labs  Lab 03/18/18 1140 03/19/18 0330 03/20/18 0357  NA 135 138 136  K 3.2* 4.6 4.5  CL 97* 103 102  CO2 24 21* 22  GLUCOSE 187* 156* 173*  BUN 14 24* 51*  CREATININE 0.98 2.07* 3.23*  CALCIUM 9.2 8.6* 8.4*  AST 86*  --  69*  ALT 36  --   57*  ALKPHOS 61  --  59  BILITOT 0.8  --  1.3*   ------------------------------------------------------------------------------------------------------------------ estimated creatinine clearance is 26.8 mL/min (A) (by C-G formula based on SCr of 3.23 mg/dL (H)). ------------------------------------------------------------------------------------------------------------------ No results for input(s): HGBA1C in the last 72 hours. ------------------------------------------------------------------------------------------------------------------ Recent Labs    03/18/18 1140 03/19/18 1643  TRIG 552* 322*   ------------------------------------------------------------------------------------------------------------------ No results for input(s): TSH, T4TOTAL, T3FREE, THYROIDAB in the last 72 hours.  Invalid input(s): FREET3 ------------------------------------------------------------------------------------------------------------------ No results for input(s): VITAMINB12, FOLATE, FERRITIN, TIBC, IRON, RETICCTPCT in the last 72 hours.  Coagulation profile No results for input(s): INR, PROTIME in the last 168 hours.  No results for input(s): DDIMER in the last 72 hours.  Cardiac Enzymes Recent Labs  Lab 03/19/18 0916 03/19/18 1402 03/19/18 2040  TROPONINI 0.07* 0.06* 0.06*   ------------------------------------------------------------------------------------------------------------------ Invalid input(s): POCBNP    Assessment & Plan   *Acute respiratory failure Continue high flow oxygen  *Acute renal failure possibly ATN Continue IV fluids  * fever-continue IV antibiotics appreciate ID input  *Acute on chronic pancreatitis severe Keep n.p.o. I MRCP once patient stable  *Ileus  *Hypertension.  Continue home medications.  Likely elevated due to pain.  *Hypokalemia.  Replaced  *Chronic pain syndrome.  Continue patient's morphine from home.  IV morphine added for  severe pain.  *DVT prophylaxis with Lovenox     Code Status Orders  (From admission, onward)         Start     Ordered   03/18/18 1407  Full code  Continuous     03/18/18 1409        Code Status History    Date Active Date Inactive Code Status Order ID Comments User Context   02/09/2018 0921 02/15/2018 1549 Full Code [id number]  Barbaraann RondoSridharan, Prasanna, MD Inpatient  11/28/2017 0352 12/04/2017 2009 Full Code [id number]  Barbaraann Rondo, MD Inpatient           Consults  nehprology and gastroenterology  DVT Prophylaxis lovenox  Lab Results  Component Value Date   PLT 334 03/20/2018     Time Spent in minutes   35 minutes  Greater than 50% of time spent in care coordination and counseling patient regarding the condition and plan of care.   Auburn Bilberry M.D on 03/20/2018 at 3:54 PM  Between 7am to 6pm - Pager - [phone]  After 6pm go to [email or web address] - Social research officer, government  [employer] [facility]   Office  [phone]

## 2018-03-20 NOTE — Progress Notes (Signed)
Terri Minium, MD [facility]   [address] [place], [place] [address] Phone: [phone] Fax : [phone]   Subjective: This patient was transferred to the ICU due to shortness of breath slightly caused by her severe pancreatitis and ileus with abdominal distention.  The patient still reports that she has a lot of abdominal pain.  The patient's lipase has gone down from 888 to 341.  The patient has had an NG tube placed with decompression.   Objective: Vital signs in last 24 hours: Vitals:   03/20/18 0610 03/20/18 0736 03/20/18 0800 03/20/18 0900  BP:   126/85 116/78  Pulse:    (!) 118  Resp:   17 17  Temp: 100.2 F (37.9 C)  97.6 F (36.4 C)   TempSrc: Axillary  Axillary   SpO2:  95%  92%  Weight:      Height:       Weight change: 0.577 kg  Intake/Output Summary (Last 24 hours) at 03/20/2018 0959 Last data filed at 03/20/2018 5170 Gross per 24 hour  Intake 2103.66 ml  Output 2970 ml  Net -866.34 ml     Exam: Heart:: Tachycardia S1-S2 Lungs: normal and clear to auscultation and percussion Abdomen: Distended tender without rebound without guarding markedly diminished bowel sounds.   Lab Results: @LABTEST2 @ Micro Results: Recent Results (from the past 240 hour(s))  MRSA PCR Screening     Status: None   Collection Time: 03/19/18  4:50 PM  Result Value Ref Range Status   MRSA by PCR NEGATIVE NEGATIVE Final    Comment:        The GeneXpert MRSA Assay (FDA approved for NASAL specimens only), is one component of a comprehensive MRSA colonization surveillance program. It is not intended to diagnose MRSA infection nor to guide or monitor treatment for MRSA infections. Performed at [facility], [address] [place], [place] [address]    Studies/Results: Dg Abd 1 View  Result Date: 03/19/2018 CLINICAL DATA:  Distended abdomen. EXAM: ABDOMEN - 1 VIEW COMPARISON:  03/19/2018 FINDINGS: Mild gaseous distention of the colon. There is no small  bowel dilation to suggest obstruction. Soft tissues are unremarkable.  No acute skeletal abnormality. IMPRESSION: 1. No evidence of bowel obstruction.  No acute finding. 2. Mild gaseous distention of the colon. Electronically Signed   By: Amie Portland M.D.   On: 03/19/2018 16:37   Dg Abd 1 View  Result Date: 03/19/2018 CLINICAL DATA:  History of influenza.  Abdominal infection. EXAM: ABDOMEN - 1 VIEW COMPARISON:  CT abdomen pelvis 03/18/2018 FINDINGS: Bibasilar heterogeneous opacities. Contrast material within the urinary bladder. Gaseous distended loops of small bowel within the central abdomen. Gas within the colon. No free intraperitoneal air. IMPRESSION: Findings suggestive of ileus. Electronically Signed   By: Annia Belt M.D.   On: 03/19/2018 09:10   Ct Chest Wo Contrast  Result Date: 03/19/2018 CLINICAL DATA:  43 y/o F; acute respiratory failure in the setting of acute pancreatitis. EXAM: CT CHEST WITHOUT CONTRAST TECHNIQUE: Multidetector CT imaging of the chest was performed following the standard protocol without IV contrast. COMPARISON:  03/19/2018 chest radiograph. 03/18/2018 CT abdomen and pelvis. FINDINGS: Cardiovascular: No significant vascular findings. Normal heart size. No pericardial effusion. Mediastinum/Nodes: No enlarged mediastinal or axillary lymph nodes. Thyroid gland, trachea, and esophagus demonstrate no significant findings. Lungs/Pleura: Low lung volumes. Small left pleural effusion. Bibasilar consolidations. No findings of pulmonary edema. No pneumothorax. Upper Abdomen: Partially visualized edema within the upper abdomen. Musculoskeletal: No fracture is  seen. IMPRESSION: 1. Progressed small left pleural effusion, low lung volumes, and bibasilar consolidations which probably represent atelectasis, less likely pneumonia. Findings atypical for ARDS. 2. Stable partially visualized edema within the upper abdomen. Electronically Signed   By: Mitzi HansenLance  Furusawa-Stratton M.D.   On:  03/19/2018 16:23   Ct Abdomen Pelvis W Contrast  Result Date: 03/18/2018 CLINICAL DATA:  Abdominal pain with vomiting. History of pancreatitis. EXAM: CT ABDOMEN AND PELVIS WITH CONTRAST TECHNIQUE: Multidetector CT imaging of the abdomen and pelvis was performed using the standard protocol following bolus administration of intravenous contrast. CONTRAST:  100mL OMNIPAQUE IOHEXOL 300 MG/ML  SOLN COMPARISON:  Abdominopelvic CT 02/10/2018 and 02/09/2018. FINDINGS: Lower chest: Bibasilar atelectasis has mildly improved. There is no significant pleural or pericardial effusion. Hepatobiliary: The hepatic density is within normal limits and no focal lesion identified. No significant biliary dilatation post cholecystectomy. Pancreas: The pancreas remains diffusely enlarged, although appears homogeneous without evidence of necrosis or acute hemorrhage. There is increasing peripancreatic inflammation and ill-defined fluid. No pancreatic ductal dilatation. Spleen: Normal in size without focal abnormality. Adrenals/Urinary Tract: Both adrenal glands appear normal. There are tiny cysts within the upper poles of both kidneys. No evidence of renal mass, urinary tract calculus or hydronephrosis. The bladder appears normal. Stomach/Bowel: No evidence of bowel wall thickening, distention or surrounding inflammatory change. The appendix appears normal. Moderate stool in the proximal colon. Vascular/Lymphatic: There are no enlarged abdominal or pelvic lymph nodes. Stable 12 mm portacaval node on image 35/2, likely reactive. The superior mesenteric and splenic veins are attenuated by the pancreatic inflammatory changes, although remain patent. No evidence of large vessel occlusion. Reproductive: Hysterectomy.  No evidence of adnexal mass. Other: Volume of pelvic ascites has not significantly increased. As above, there is slightly greater inflammatory change and ill-defined fluid surrounding the pancreas and extending into both  pericolic gutters. No focal fluid collections are identified. Musculoskeletal: No acute or significant osseous findings. Severe discogenic sclerosis at L4-5. IMPRESSION: 1. Slight progression of extensive peripancreatic inflammatory changes with ill-defined fluid. No evidence of pancreatic hemorrhage, necrosis or focal fluid collection. 2. Attenuation of the splenic and superior mesenteric veins without evidence of large vessel occlusion. 3. Improved bibasilar atelectasis. Electronically Signed   By: Carey BullocksWilliam  Veazey M.D.   On: 03/18/2018 12:59   Dg Chest Port 1 View  Result Date: 03/19/2018 CLINICAL DATA:  Shortness of breath EXAM: PORTABLE CHEST 1 VIEW COMPARISON:  02/10/2018 FINDINGS: Low volume chest with streaky densities at the bases correlating with atelectasis by abdominal CT yesterday. No edema, effusion, or pneumothorax. Normal heart size and mediastinal contours. IMPRESSION: Low volume chest with bilateral lower lobe atelectasis. Electronically Signed   By: Marnee SpringJonathon  Watts M.D.   On: 03/19/2018 09:10   Dg Abd Portable 1v  Result Date: 03/19/2018 CLINICAL DATA:  Post NG tube placement EXAM: PORTABLE ABDOMEN - 1 VIEW COMPARISON:  03/19/2018 FINDINGS: Interval placement of nasogastric tube which decompresses the stomach. Nasogastric tube tip overlies the level of the mid stomach. Persistent gaseous distension of bowel loops noted. IMPRESSION: NG tube tip is in the stomach. Electronically Signed   By: Norva PavlovElizabeth  Brown M.D.   On: 03/19/2018 17:53   Medications: I have reviewed the patient's current medications. Scheduled Meds: . atorvastatin  40 mg Oral Daily  . bisacodyl  10 mg Rectal Once  . chlorhexidine  15 mL Mouth Rinse BID  . enoxaparin (LOVENOX) injection  40 mg Subcutaneous Q24H  . fenofibrate  160 mg Oral Daily  . FLUoxetine  40 mg Oral Daily  . fluticasone  2 spray Each Nare BID  . mouth rinse  15 mL Mouth Rinse q12n4p  . pantoprazole  40 mg Oral Daily  . polyethylene glycol  17  g Oral Daily  . senna-docusate  2 tablet Oral BID  . sodium chloride flush  3 mL Intravenous Q12H   Continuous Infusions: . sodium chloride Stopped (03/19/18 2156)  . lactated ringers 75 mL/hr (03/20/18 0959)  . piperacillin-tazobactam (ZOSYN)  IV 12.5 mL/hr at 03/20/18 0907   PRN Meds:.sodium chloride, acetaminophen **OR** acetaminophen, albuterol, clonazePAM, metoCLOPramide (REGLAN) injection, morphine, morphine injection, promethazine   Assessment: Active Problems:   Acute pancreatitis   Distended abdomen    Plan: This patient has recurrent pancreatitis thought to be from increased triglycerides.  The patient has not had a complete work-up and should at some point have an MRCP when she is more stable.  The patient now is having ileus with abdominal distention and shortness of breath likely due to her abdominal distention and severe pancreatitis.  The patient will be followed by Dr. Norma Fredrickson over the weekend.   LOS: 2 days   Terri Berry 03/20/2018, 9:59 AM

## 2018-03-20 NOTE — Progress Notes (Signed)
[facility]  ROUNDING NOTE   Subjective:   Transferred to ICU due to shortness of breath.   LR at 75  UOP 1375 NGT 1500  Objective:  Vital signs in last 24 hours:  Temp:  [97.6 F (36.4 C)-102.6 F (39.2 C)] 97.6 F (36.4 C) (02/14 0800) Pulse Rate:  [111-131] 112 (02/14 1000) Resp:  [12-26] 18 (02/14 1000) BP: (72-135)/(57-87) 135/87 (02/14 1000) SpO2:  [88 %-95 %] 95 % (02/14 1123) FiO2 (%):  [35 %-50 %] 40 % (02/14 1123) Weight:  [98.1 kg-98.7 kg] 98.7 kg (02/14 0238)  Weight change: 0.577 kg Filed Weights   03/19/18 0500 03/19/18 1627 03/20/18 0238  Weight: 99 kg 98.1 kg 98.7 kg    Intake/Output: I/O last 3 completed shifts: In: 3245 [I.V.:3044.5; IV Piggyback:200.5] Out: 2875 [Urine:1375; Emesis/NG output:1500]   Intake/Output this shift:  Total I/O In: 359.9 [I.V.:319.1; IV Piggyback:40.7] Out: 945 [Urine:495; Emesis/NG output:450]  Physical Exam: General: NAD,   Head: Normocephalic, atraumatic. Moist oral mucosal membranes  Eyes: Anicteric, PERRL  Neck: Supple, trachea midline  Lungs:  Clear to auscultation  Heart: Regular rate and rhythm  Abdomen:  +tender, obese, distended  Extremities:  no peripheral edema.  Neurologic: Nonfocal, moving all four extremities  Skin: No lesions  GU: +foley    Basic Metabolic Panel: Recent Labs  Lab 03/18/18 1140 03/19/18 0330 03/20/18 0357  NA 135 138 136  K 3.2* 4.6 4.5  CL 97* 103 102  CO2 24 21* 22  GLUCOSE 187* 156* 173*  BUN 14 24* 51*  CREATININE 0.98 2.07* 3.23*  CALCIUM 9.2 8.6* 8.4*    Liver Function Tests: Recent Labs  Lab 03/18/18 1140 03/20/18 0357  AST 86* 69*  ALT 36 57*  ALKPHOS 61 59  BILITOT 0.8 1.3*  PROT 8.3* 6.5  ALBUMIN 4.2 2.6*   Recent Labs  Lab 03/18/18 1140 03/19/18 0330 03/20/18 0357  LIPASE 1,044* 888* 341*   No results for input(s): AMMONIA in the last 168 hours.  CBC: Recent Labs  Lab 03/18/18 1140 03/19/18 0330 03/20/18 0357  WBC 23.6*  28.2* 24.0*  HGB 13.6 14.3 11.5*  HCT 44.4 46.6* 37.2  MCV 84.7 85.2 83.0  PLT 466* 476* 334    Cardiac Enzymes: Recent Labs  Lab 03/19/18 0916 03/19/18 1402 03/19/18 2040  TROPONINI 0.07* 0.06* 0.06*    BNP: Invalid input(s): POCBNP  CBG: Recent Labs  Lab 03/19/18 1639  GLUCAP 176*    Microbiology: Results for orders placed or performed during the hospital encounter of 03/18/18  CULTURE, BLOOD (ROUTINE X 2) w Reflex to ID Panel     Status: None (Preliminary result)   Collection Time: 03/19/18  9:16 AM  Result Value Ref Range Status   Specimen Description BLOOD Left thumb  Final   Special Requests   Final    BOTTLES DRAWN AEROBIC AND ANAEROBIC Blood Culture results may not be optimal due to an inadequate volume of blood received in culture bottles   Culture   Final    NO GROWTH 1 DAY Performed at [facility], [address], [place], [place] [address]    Report Status PENDING  Incomplete  CULTURE, BLOOD (ROUTINE X 2) w Reflex to ID Panel     Status: None (Preliminary result)   Collection Time: 03/19/18  9:16 AM  Result Value Ref Range Status   Specimen Description BLOOD BLOOD LEFT HAND  Final   Special Requests   Final    BOTTLES DRAWN AEROBIC  AND ANAEROBIC Blood Culture adequate volume   Culture   Final    NO GROWTH 1 DAY Performed at [facility], [address] [place], [place] [address]    Report Status PENDING  Incomplete  MRSA PCR Screening     Status: None   Collection Time: 03/19/18  4:50 PM  Result Value Ref Range Status   MRSA by PCR NEGATIVE NEGATIVE Final    Comment:        The GeneXpert MRSA Assay (FDA approved for NASAL specimens only), is one component of a comprehensive MRSA colonization surveillance program. It is not intended to diagnose MRSA infection nor to guide or monitor treatment for MRSA infections. Performed at [facility], [address] [place], [place] [address]     Coagulation  Studies: No results for input(s): LABPROT, INR in the last 72 hours.  Urinalysis: Recent Labs    03/19/18 0922  COLORURINE YELLOW*  YELLOW*  LABSPEC 1.023  1.021  PHURINE 5.0  5.0  GLUCOSEU NEGATIVE  NEGATIVE  HGBUR NEGATIVE  NEGATIVE  BILIRUBINUR NEGATIVE  NEGATIVE  KETONESUR NEGATIVE  NEGATIVE  PROTEINUR NEGATIVE  NEGATIVE  NITRITE NEGATIVE  NEGATIVE  LEUKOCYTESUR NEGATIVE  NEGATIVE      Imaging: Dg Abd 1 View  Result Date: 03/19/2018 CLINICAL DATA:  Distended abdomen. EXAM: ABDOMEN - 1 VIEW COMPARISON:  03/19/2018 FINDINGS: Mild gaseous distention of the colon. There is no small bowel dilation to suggest obstruction. Soft tissues are unremarkable.  No acute skeletal abnormality. IMPRESSION: 1. No evidence of bowel obstruction.  No acute finding. 2. Mild gaseous distention of the colon. Electronically Signed   By: Amie Portland M.D.   On: 03/19/2018 16:37   Dg Abd 1 View  Result Date: 03/19/2018 CLINICAL DATA:  History of influenza.  Abdominal infection. EXAM: ABDOMEN - 1 VIEW COMPARISON:  CT abdomen pelvis 03/18/2018 FINDINGS: Bibasilar heterogeneous opacities. Contrast material within the urinary bladder. Gaseous distended loops of small bowel within the central abdomen. Gas within the colon. No free intraperitoneal air. IMPRESSION: Findings suggestive of ileus. Electronically Signed   By: Annia Belt M.D.   On: 03/19/2018 09:10   Ct Chest Wo Contrast  Result Date: 03/19/2018 CLINICAL DATA:  43 y/o F; acute respiratory failure in the setting of acute pancreatitis. EXAM: CT CHEST WITHOUT CONTRAST TECHNIQUE: Multidetector CT imaging of the chest was performed following the standard protocol without IV contrast. COMPARISON:  03/19/2018 chest radiograph. 03/18/2018 CT abdomen and pelvis. FINDINGS: Cardiovascular: No significant vascular findings. Normal heart size. No pericardial effusion. Mediastinum/Nodes: No enlarged mediastinal or axillary lymph nodes. Thyroid gland,  trachea, and esophagus demonstrate no significant findings. Lungs/Pleura: Low lung volumes. Small left pleural effusion. Bibasilar consolidations. No findings of pulmonary edema. No pneumothorax. Upper Abdomen: Partially visualized edema within the upper abdomen. Musculoskeletal: No fracture is seen. IMPRESSION: 1. Progressed small left pleural effusion, low lung volumes, and bibasilar consolidations which probably represent atelectasis, less likely pneumonia. Findings atypical for ARDS. 2. Stable partially visualized edema within the upper abdomen. Electronically Signed   By: Mitzi Hansen M.D.   On: 03/19/2018 16:23   Dg Chest Port 1 View  Result Date: 03/19/2018 CLINICAL DATA:  Shortness of breath EXAM: PORTABLE CHEST 1 VIEW COMPARISON:  02/10/2018 FINDINGS: Low volume chest with streaky densities at the bases correlating with atelectasis by abdominal CT yesterday. No edema, effusion, or pneumothorax. Normal heart size and mediastinal contours. IMPRESSION: Low volume chest with bilateral lower lobe atelectasis. Electronically Signed   By:  Marnee SpringJonathon  Watts M.D.   On: 03/19/2018 09:10   Dg Abd Portable 1v  Result Date: 03/19/2018 CLINICAL DATA:  Post NG tube placement EXAM: PORTABLE ABDOMEN - 1 VIEW COMPARISON:  03/19/2018 FINDINGS: Interval placement of nasogastric tube which decompresses the stomach. Nasogastric tube tip overlies the level of the mid stomach. Persistent gaseous distension of bowel loops noted. IMPRESSION: NG tube tip is in the stomach. Electronically Signed   By: Norva PavlovElizabeth  Brown M.D.   On: 03/19/2018 17:53     Medications:   . sodium chloride Stopped (03/19/18 2156)  . lactated ringers 75 mL/hr at 03/20/18 1013  . piperacillin-tazobactam (ZOSYN)  IV 3.375 g (03/20/18 1325)   . atorvastatin  40 mg Oral Daily  . bisacodyl  10 mg Rectal Once  . chlorhexidine  15 mL Mouth Rinse BID  . enoxaparin (LOVENOX) injection  40 mg Subcutaneous Q24H  . fenofibrate  160 mg Oral  Daily  . FLUoxetine  40 mg Oral Daily  . fluticasone  2 spray Each Nare BID  . mouth rinse  15 mL Mouth Rinse q12n4p  . pantoprazole  40 mg Oral Daily  . polyethylene glycol  17 g Oral Daily  . senna-docusate  2 tablet Oral BID  . sodium chloride flush  3 mL Intravenous Q12H   sodium chloride, acetaminophen **OR** acetaminophen, albuterol, clonazePAM, metoCLOPramide (REGLAN) injection, morphine, morphine injection, promethazine  Assessment/ Plan:  Ms. Thomasene LotDiane Berry is a 43 y.o. white nonhispanic female with morbid obesity, hypertension, depression, anxiety, GERD who [facility] for Acute pancreatitis without infection or necrosis, unspecified pancreatitis type [K85.90]  1. Acute renal failure: with IV contrast exposure on 2/12, hypotension, poor PO intake, use of NSAIDs. Ketorolac given in ED as well.  Creatinine on admission of 0.98, acute rise. Nonoliguric.  Foley catheter placed.  Most likely ATN from multifactorial etiologies.  Hold home furosemide dose - IV fluids - Monitor volume status and urine output  2. Sepsis/hypotension with lactic acidosis: febrile with leukocytosis. Blood cultures are negative so far.  Suspect secondary to acute pancreatitis.  - empiric zosyn.  - Appreciate ID input.   3. Acute pancreatitis: NPO for bowel rest. Continue IV fluids as above.    LOS: 2 Nahsir Venezia 2/14/20202:00 PM

## 2018-03-20 NOTE — Progress Notes (Signed)
Patient ID: Terri Berry, female   DOB: [birth year], 43 y.o.   MRN: [id number] Multidisciplinary rounds were performed.  She is being evaluated by multiple specialists.  Discussed the case with Dr. Rivka Safer and Dr. Wynelle Link.  Currently not requiring oxygen.  Have reviewed GI recommendations.  The patient will need MRCP. She is no longer requiring oxygen.  Pain control is better.  Currently she is MedSurg status.

## 2018-03-20 NOTE — Care Management Note (Signed)
Case Management Note  Patient Details  Name: Terri Berry MRN: [id number] Date of Birth: 10/07/1975  Subjective/Objective:  Patient admitted with Pancreatitis, patient has frequent admissions 3 in the past 6 months and 2 ED visits.  RNCM attempted to complete assessment but patient is medicated for pain and anxiety and unable to talk at this time.  RNCM has met with the patient during a past admission, at that time the patient was living with her father and receiving PCP services from [facility].  RNCM will need to verify that information.   RN BSN  [phone]                   Action/Plan:   Expected Discharge Date:                  Expected Discharge Plan:     In-House Referral:     Discharge planning Services     Post Acute Care Choice:    Choice offered to:     DME Arranged:    DME Agency:     HH Arranged:    HH Agency:     Status of Service:     If discussed at Long Length of Stay Meetings, dates discussed:    Additional Comments:   M , RN 03/20/2018, 11:39 AM  

## 2018-03-20 NOTE — Progress Notes (Signed)
Date of Admission:  03/18/2018    ID: Terri Berry is a 43 y.o. female Active Problems:   Acute pancreatitis   Distended abdomen    Subjective: Sedated with pain meds and sleeping Not much history available  Medications:  . atorvastatin  40 mg Oral Daily  . bisacodyl  10 mg Rectal Once  . chlorhexidine  15 mL Mouth Rinse BID  . enoxaparin (LOVENOX) injection  40 mg Subcutaneous Q24H  . fenofibrate  160 mg Oral Daily  . FLUoxetine  40 mg Oral Daily  . fluticasone  2 spray Each Nare BID  . mouth rinse  15 mL Mouth Rinse q12n4p  . pantoprazole  40 mg Oral Daily  . polyethylene glycol  17 g Oral Daily  . senna-docusate  2 tablet Oral BID  . sodium chloride flush  3 mL Intravenous Q12H    Objective: Vital signs in last 24 hours: Temp:  [97.6 F (36.4 C)-102.6 F (39.2 C)] 97.6 F (36.4 C) (02/14 0800) Pulse Rate:  [111-132] 112 (02/14 1000) Resp:  [12-26] 18 (02/14 1000) BP: (72-135)/(57-87) 135/87 (02/14 1000) SpO2:  [88 %-99 %] 94 % (02/14 1000) FiO2 (%):  [50 %] 50 % (02/14 0736) Weight:  [98.1 kg-98.7 kg] 98.7 kg (02/14 0238)  PHYSICAL EXAM:  General: sleeping/sedated-on calling her name she responded to questions appropriately and went back to sleep Lungs: b/l air entry- decreased bases Heart: s1s2 tachycardia  Abdomen: distended. Tender Extremities: dry skin both heels- 2 cm superficila callus /ulcer on the left 5th MTP area- not grossly infected     Dry wound on the dorsal aspect . Neurologic: did not examine  Lab Results Recent Labs    03/19/18 0330 03/20/18 0357  WBC 28.2* 24.0*  HGB 14.3 11.5*  HCT 46.6* 37.2  NA 138 136  K 4.6 4.5  CL 103 102  CO2 21* 22  BUN 24* 51*  CREATININE 2.07* 3.23*   Liver Panel Recent Labs    03/18/18 1140 03/20/18 0357  PROT 8.3* 6.5  ALBUMIN 4.2 2.6*  AST 86* 69*  ALT 36 57*  ALKPHOS 61 59  BILITOT 0.8 1.3*  BILIDIR  --  0.6*  IBILI  --  0.7   Sedimentation Rate No results for input(s):  ESRSEDRATE in the last 72 hours. C-Reactive Protein No results for input(s): CRP in the last 72 hours.  Microbiology:  Studies/Results: Dg Abd 1 View  Result Date: 03/19/2018 CLINICAL DATA:  Distended abdomen. EXAM: ABDOMEN - 1 VIEW COMPARISON:  03/19/2018 FINDINGS: Mild gaseous distention of the colon. There is no small bowel dilation to suggest obstruction. Soft tissues are unremarkable.  No acute skeletal abnormality. IMPRESSION: 1. No evidence of bowel obstruction.  No acute finding. 2. Mild gaseous distention of the colon. Electronically Signed   By: Amie Portland M.D.   On: 03/19/2018 16:37   Dg Abd 1 View  Result Date: 03/19/2018 CLINICAL DATA:  History of influenza.  Abdominal infection. EXAM: ABDOMEN - 1 VIEW COMPARISON:  CT abdomen pelvis 03/18/2018 FINDINGS: Bibasilar heterogeneous opacities. Contrast material within the urinary bladder. Gaseous distended loops of small bowel within the central abdomen. Gas within the colon. No free intraperitoneal air. IMPRESSION: Findings suggestive of ileus. Electronically Signed   By: Annia Belt M.D.   On: 03/19/2018 09:10   Ct Chest Wo Contrast  Result Date: 03/19/2018 CLINICAL DATA:  43 y/o F; acute respiratory failure in the setting of acute pancreatitis. EXAM: CT CHEST WITHOUT CONTRAST TECHNIQUE: Multidetector CT  imaging of the chest was performed following the standard protocol without IV contrast. COMPARISON:  03/19/2018 chest radiograph. 03/18/2018 CT abdomen and pelvis. FINDINGS: Cardiovascular: No significant vascular findings. Normal heart size. No pericardial effusion. Mediastinum/Nodes: No enlarged mediastinal or axillary lymph nodes. Thyroid gland, trachea, and esophagus demonstrate no significant findings. Lungs/Pleura: Low lung volumes. Small left pleural effusion. Bibasilar consolidations. No findings of pulmonary edema. No pneumothorax. Upper Abdomen: Partially visualized edema within the upper abdomen. Musculoskeletal: No fracture  is seen. IMPRESSION: 1. Progressed small left pleural effusion, low lung volumes, and bibasilar consolidations which probably represent atelectasis, less likely pneumonia. Findings atypical for ARDS. 2. Stable partially visualized edema within the upper abdomen. Electronically Signed   By: Mitzi HansenLance  Furusawa-Stratton M.D.   On: 03/19/2018 16:23   Ct Abdomen Pelvis W Contrast  Result Date: 03/18/2018 CLINICAL DATA:  Abdominal pain with vomiting. History of pancreatitis. EXAM: CT ABDOMEN AND PELVIS WITH CONTRAST TECHNIQUE: Multidetector CT imaging of the abdomen and pelvis was performed using the standard protocol following bolus administration of intravenous contrast. CONTRAST:  100mL OMNIPAQUE IOHEXOL 300 MG/ML  SOLN COMPARISON:  Abdominopelvic CT 02/10/2018 and 02/09/2018. FINDINGS: Lower chest: Bibasilar atelectasis has mildly improved. There is no significant pleural or pericardial effusion. Hepatobiliary: The hepatic density is within normal limits and no focal lesion identified. No significant biliary dilatation post cholecystectomy. Pancreas: The pancreas remains diffusely enlarged, although appears homogeneous without evidence of necrosis or acute hemorrhage. There is increasing peripancreatic inflammation and ill-defined fluid. No pancreatic ductal dilatation. Spleen: Normal in size without focal abnormality. Adrenals/Urinary Tract: Both adrenal glands appear normal. There are tiny cysts within the upper poles of both kidneys. No evidence of renal mass, urinary tract calculus or hydronephrosis. The bladder appears normal. Stomach/Bowel: No evidence of bowel wall thickening, distention or surrounding inflammatory change. The appendix appears normal. Moderate stool in the proximal colon. Vascular/Lymphatic: There are no enlarged abdominal or pelvic lymph nodes. Stable 12 mm portacaval node on image 35/2, likely reactive. The superior mesenteric and splenic veins are attenuated by the pancreatic inflammatory  changes, although remain patent. No evidence of large vessel occlusion. Reproductive: Hysterectomy.  No evidence of adnexal mass. Other: Volume of pelvic ascites has not significantly increased. As above, there is slightly greater inflammatory change and ill-defined fluid surrounding the pancreas and extending into both pericolic gutters. No focal fluid collections are identified. Musculoskeletal: No acute or significant osseous findings. Severe discogenic sclerosis at L4-5. IMPRESSION: 1. Slight progression of extensive peripancreatic inflammatory changes with ill-defined fluid. No evidence of pancreatic hemorrhage, necrosis or focal fluid collection. 2. Attenuation of the splenic and superior mesenteric veins without evidence of large vessel occlusion. 3. Improved bibasilar atelectasis. Electronically Signed   By: Carey BullocksWilliam  Veazey M.D.   On: 03/18/2018 12:59   Dg Chest Port 1 View  Result Date: 03/19/2018 CLINICAL DATA:  Shortness of breath EXAM: PORTABLE CHEST 1 VIEW COMPARISON:  02/10/2018 FINDINGS: Low volume chest with streaky densities at the bases correlating with atelectasis by abdominal CT yesterday. No edema, effusion, or pneumothorax. Normal heart size and mediastinal contours. IMPRESSION: Low volume chest with bilateral lower lobe atelectasis. Electronically Signed   By: Marnee SpringJonathon  Watts M.D.   On: 03/19/2018 09:10   Dg Abd Portable 1v  Result Date: 03/19/2018 CLINICAL DATA:  Post NG tube placement EXAM: PORTABLE ABDOMEN - 1 VIEW COMPARISON:  03/19/2018 FINDINGS: Interval placement of nasogastric tube which decompresses the stomach. Nasogastric tube tip overlies the level of the mid stomach. Persistent gaseous distension of bowel  loops noted. IMPRESSION: NG tube tip is in the stomach. Electronically Signed   By: Norva Pavlov M.D.   On: 03/19/2018 17:53     Assessment/Plan:  43 year old female with history of acute on chronic pancreatitis admitted with abdominal pain and found to have  fever high white count ? Fever and leukocytosis improving   acute on chronic pancreatitis.  Raises concern for peripancreatic phlegmon.  Need to closely observe for any pancreatic abscess  or necrosis even though it is not visible in the first CT abdomen done on 03/18/18 Blood cultures have been sent.  Agree with Zosyn which will cover  gram-negative rods and anaerobes which can translocate from the intestine into the pancreas. Low threshold for starting antifungal if her condition changes  Ileus related to the pancreatitis  AKI: due to prerenal, ileus, ATN- contrast less likely   Acute respiratory distress secondary to ileus, bilateral atelectasis no ARDS  Discussed with intensivist ID will follow her peripherally this weekend- call if needed

## 2018-03-21 LAB — COMPREHENSIVE METABOLIC PANEL
ALT: 35 U/L (ref 0–44)
AST: 34 U/L (ref 15–41)
Albumin: 2.4 g/dL — ABNORMAL LOW (ref 3.5–5.0)
Alkaline Phosphatase: 67 U/L (ref 38–126)
Anion gap: 9 (ref 5–15)
BUN: 51 mg/dL — ABNORMAL HIGH (ref 6–20)
CO2: 26 mmol/L (ref 22–32)
Calcium: 8.3 mg/dL — ABNORMAL LOW (ref 8.9–10.3)
Chloride: 99 mmol/L (ref 98–111)
Creatinine, Ser: 2.46 mg/dL — ABNORMAL HIGH (ref 0.44–1.00)
GFR calc Af Amer: 27 mL/min — ABNORMAL LOW (ref >60)
GFR calc non Af Amer: 23 mL/min — ABNORMAL LOW (ref >60)
Glucose, Bld: 188 mg/dL — ABNORMAL HIGH (ref 70–99)
POTASSIUM: 3.9 mmol/L (ref 3.5–5.1)
Sodium: 134 mmol/L — ABNORMAL LOW (ref 135–145)
TOTAL PROTEIN: 6.5 g/dL (ref 6.5–8.1)
Total Bilirubin: 0.9 mg/dL (ref 0.3–1.2)

## 2018-03-21 LAB — CBC
HCT: 29.4 % — ABNORMAL LOW (ref 36.0–46.0)
Hemoglobin: 9.1 g/dL — ABNORMAL LOW (ref 12.0–15.0)
MCH: 26.3 pg (ref 26.0–34.0)
MCHC: 31 g/dL (ref 30.0–36.0)
MCV: 85 fL (ref 80.0–100.0)
Platelets: 295 10*3/uL (ref 150–400)
RBC: 3.46 MIL/uL — ABNORMAL LOW (ref 3.87–5.11)
RDW: 14.8 % (ref 11.5–15.5)
WBC: 20.8 10*3/uL — ABNORMAL HIGH (ref 4.0–10.5)
nRBC: 0.1 % (ref 0.0–0.2)

## 2018-03-21 LAB — MAGNESIUM: Magnesium: 1.9 mg/dL (ref 1.7–2.4)

## 2018-03-21 LAB — PHOSPHORUS: Phosphorus: 3.3 mg/dL (ref 2.5–4.6)

## 2018-03-21 LAB — LIPASE, BLOOD: Lipase: 76 U/L — ABNORMAL HIGH (ref 11–51)

## 2018-03-21 MED ORDER — ENOXAPARIN SODIUM 40 MG/0.4ML ~~LOC~~ SOLN
40.0000 mg | SUBCUTANEOUS | Status: DC
  Administered 2018-03-21 – 2018-03-23 (×2): 40 mg via SUBCUTANEOUS
  Filled 2018-03-21 (×2): qty 0.4

## 2018-03-21 NOTE — Progress Notes (Signed)
[facility]  ROUNDING NOTE   Subjective:   Transferred to 2C from ICU.  UOP 1900 NGT 2100  LR at 6075mL/hr  Creatinine 2.46 (3.23)  Hemoglobin 9.1 (11.5)  Started on clears this morning.   Objective:  Vital signs in last 24 hours:  Temp:  [97.8 F (36.6 C)-99 F (37.2 C)] 97.8 F (36.6 C) (02/15 0529) Pulse Rate:  [90-116] 99 (02/15 0529) Resp:  [14-20] 20 (02/15 0529) BP: (99-123)/(65-86) 114/82 (02/15 0529) SpO2:  [91 %-96 %] 95 % (02/15 0529) FiO2 (%):  [40 %] 40 % (02/14 2033)  Weight change:  Filed Weights   03/19/18 0500 03/19/18 1627 03/20/18 0238  Weight: 99 kg 98.1 kg 98.7 kg    Intake/Output: I/O last 3 completed shifts: In: 1441.2 [I.V.:1246.5; IV Piggyback:194.8] Out: 5775 [Urine:2775; Emesis/NG output:3000]   Intake/Output this shift:  Total I/O In: 1238.5 [I.V.:1101; IV Piggyback:137.5] Out: 850 [Urine:850]  Physical Exam: General: NAD,   Head: NGT  Eyes: Anicteric, PERRL  Neck: Supple, trachea midline  Lungs:  Clear to auscultation  Heart: Regular rate and rhythm  Abdomen:  +tender, obese, distended  Extremities:  no peripheral edema.  Neurologic: Nonfocal, moving all four extremities  Skin: No lesions  GU: +foley    Basic Metabolic Panel: Recent Labs  Lab 03/18/18 1140 03/19/18 0330 03/20/18 0357 03/21/18 0614  NA 135 138 136 134*  K 3.2* 4.6 4.5 3.9  CL 97* 103 102 99  CO2 24 21* 22 26  GLUCOSE 187* 156* 173* 188*  BUN 14 24* 51* 51*  CREATININE 0.98 2.07* 3.23* 2.46*  CALCIUM 9.2 8.6* 8.4* 8.3*  MG  --   --   --  1.9  PHOS  --   --   --  3.3    Liver Function Tests: Recent Labs  Lab 03/18/18 1140 03/20/18 0357 03/21/18 0614  AST 86* 69* 34  ALT 36 57* 35  ALKPHOS 61 59 67  BILITOT 0.8 1.3* 0.9  PROT 8.3* 6.5 6.5  ALBUMIN 4.2 2.6* 2.4*   Recent Labs  Lab 03/18/18 1140 03/19/18 0330 03/20/18 0357 03/21/18 0614  LIPASE 1,044* 888* 341* 76*   No results for input(s): AMMONIA in the last 168  hours.  CBC: Recent Labs  Lab 03/18/18 1140 03/19/18 0330 03/20/18 0357 03/21/18 0614  WBC 23.6* 28.2* 24.0* 20.8*  HGB 13.6 14.3 11.5* 9.1*  HCT 44.4 46.6* 37.2 29.4*  MCV 84.7 85.2 83.0 85.0  PLT 466* 476* 334 295    Cardiac Enzymes: Recent Labs  Lab 03/19/18 0916 03/19/18 1402 03/19/18 2040  TROPONINI 0.07* 0.06* 0.06*    BNP: Invalid input(s): POCBNP  CBG: Recent Labs  Lab 03/19/18 1639  GLUCAP 176*    Microbiology: Results for orders placed or performed during the hospital encounter of 03/18/18  CULTURE, BLOOD (ROUTINE X 2) w Reflex to ID Panel     Status: None (Preliminary result)   Collection Time: 03/19/18  9:16 AM  Result Value Ref Range Status   Specimen Description BLOOD Left thumb  Final   Special Requests   Final    BOTTLES DRAWN AEROBIC AND ANAEROBIC Blood Culture results may not be optimal due to an inadequate volume of blood received in culture bottles   Culture   Final    NO GROWTH 2 DAYS Performed at [facility], [address] [place], [place] [address]    Report Status PENDING  Incomplete  CULTURE, BLOOD (ROUTINE X 2) w Reflex to ID Panel  Status: None (Preliminary result)   Collection Time: 03/19/18  9:16 AM  Result Value Ref Range Status   Specimen Description BLOOD BLOOD LEFT HAND  Final   Special Requests   Final    BOTTLES DRAWN AEROBIC AND ANAEROBIC Blood Culture adequate volume   Culture   Final    NO GROWTH 2 DAYS Performed at [facility], [address], [place], [place] [address]    Report Status PENDING  Incomplete  MRSA PCR Screening     Status: None   Collection Time: 03/19/18  4:50 PM  Result Value Ref Range Status   MRSA by PCR NEGATIVE NEGATIVE Final    Comment:        The GeneXpert MRSA Assay (FDA approved for NASAL specimens only), is one component of a comprehensive MRSA colonization surveillance program. It is not intended to diagnose MRSA infection nor to guide or monitor  treatment for MRSA infections. Performed at [facility], [address] [place], [place] [address]     Coagulation Studies: No results for input(s): LABPROT, INR in the last 72 hours.  Urinalysis: Recent Labs    03/19/18 0922  COLORURINE YELLOW*  YELLOW*  LABSPEC 1.023  1.021  PHURINE 5.0  5.0  GLUCOSEU NEGATIVE  NEGATIVE  HGBUR NEGATIVE  NEGATIVE  BILIRUBINUR NEGATIVE  NEGATIVE  KETONESUR NEGATIVE  NEGATIVE  PROTEINUR NEGATIVE  NEGATIVE  NITRITE NEGATIVE  NEGATIVE  LEUKOCYTESUR NEGATIVE  NEGATIVE      Imaging: Dg Abd 1 View  Result Date: 03/19/2018 CLINICAL DATA:  Distended abdomen. EXAM: ABDOMEN - 1 VIEW COMPARISON:  03/19/2018 FINDINGS: Mild gaseous distention of the colon. There is no small bowel dilation to suggest obstruction. Soft tissues are unremarkable.  No acute skeletal abnormality. IMPRESSION: 1. No evidence of bowel obstruction.  No acute finding. 2. Mild gaseous distention of the colon. Electronically Signed   By: Amie Portland M.D.   On: 03/19/2018 16:37   Ct Chest Wo Contrast  Result Date: 03/19/2018 CLINICAL DATA:  43 y/o F; acute respiratory failure in the setting of acute pancreatitis. EXAM: CT CHEST WITHOUT CONTRAST TECHNIQUE: Multidetector CT imaging of the chest was performed following the standard protocol without IV contrast. COMPARISON:  03/19/2018 chest radiograph. 03/18/2018 CT abdomen and pelvis. FINDINGS: Cardiovascular: No significant vascular findings. Normal heart size. No pericardial effusion. Mediastinum/Nodes: No enlarged mediastinal or axillary lymph nodes. Thyroid gland, trachea, and esophagus demonstrate no significant findings. Lungs/Pleura: Low lung volumes. Small left pleural effusion. Bibasilar consolidations. No findings of pulmonary edema. No pneumothorax. Upper Abdomen: Partially visualized edema within the upper abdomen. Musculoskeletal: No fracture is seen. IMPRESSION: 1. Progressed small left pleural effusion, low  lung volumes, and bibasilar consolidations which probably represent atelectasis, less likely pneumonia. Findings atypical for ARDS. 2. Stable partially visualized edema within the upper abdomen. Electronically Signed   By: Mitzi Hansen M.D.   On: 03/19/2018 16:23   Dg Abd Portable 1v  Result Date: 03/19/2018 CLINICAL DATA:  Post NG tube placement EXAM: PORTABLE ABDOMEN - 1 VIEW COMPARISON:  03/19/2018 FINDINGS: Interval placement of nasogastric tube which decompresses the stomach. Nasogastric tube tip overlies the level of the mid stomach. Persistent gaseous distension of bowel loops noted. IMPRESSION: NG tube tip is in the stomach. Electronically Signed   By: Norva Pavlov M.D.   On: 03/19/2018 17:53     Medications:   . sodium chloride Stopped (03/19/18 2156)  . lactated ringers Stopped (03/21/18 0505)  . piperacillin-tazobactam (ZOSYN)  IV Stopped (03/21/18  1051)   . atorvastatin  40 mg Oral Daily  . chlorhexidine  15 mL Mouth Rinse BID  . enoxaparin (LOVENOX) injection  40 mg Subcutaneous Q24H  . fenofibrate  160 mg Oral Daily  . FLUoxetine  40 mg Oral Daily  . fluticasone  2 spray Each Nare BID  . mouth rinse  15 mL Mouth Rinse q12n4p  . pantoprazole  40 mg Oral Daily  . polyethylene glycol  17 g Oral Daily  . senna-docusate  2 tablet Oral BID  . sodium chloride flush  3 mL Intravenous Q12H   sodium chloride, acetaminophen **OR** acetaminophen, albuterol, clonazePAM, metoCLOPramide (REGLAN) injection, morphine injection, promethazine  Assessment/ Plan:  Ms. Sayda Wentzell is a 43 y.o. white nonhispanic female with morbid obesity, hypertension, depression, anxiety, GERD who was admitted to [facility] on 03/18/2018 for acute pancreatitis.   1. Acute renal failure: with IV contrast exposure on 2/12, hypotension, poor PO intake, use of NSAIDs. Ketorolac given in ED as well.  Creatinine peaked at 3.23.  Creatinine on admission of 0.98, acute rise. Nonoliguric.  Foley catheter  placed.  Most likely ATN from multifactorial etiologies. Creatinine trending down.  Hold home furosemide dose - IV fluids - Monitor volume status and urine output  2. Sepsis/hypotension with lactic acidosis: febrile with leukocytosis. Blood cultures are negative so far.  Secondary to acute pancreatitis.  - empiric zosyn.  - Appreciate ID input.   3. Acute pancreatitis: Continue supportive care.    LOS: 3 Terri Berry 2/15/202012:37 PM

## 2018-03-21 NOTE — Progress Notes (Signed)
[employer] - [place] at [facility]                                                                                                                                                                                  Patient Demographics   Terri Berry, is a 43 y.o. female, DOB - 1975/02/24, YNW:[id number]  Admit date - 03/18/2018   Admitting Physician Milagros LollSrikar Sudini, MD  Outpatient Primary MD for the patient is Thomes DinningWeeks, Cynthia, MD   LOS - 3  Subjective: Breathing is little bit better this morning.  Stable on 5L O2 by nasal cannula this morning.  Still endorses some severe abdominal pain.  She has not had a bowel movement and is not passing gas.  She states that her mouth is very dry and she would like something to drink.   Review of Systems:   CONSTITUTIONAL: No documented fever. No fatigue, weakness. No weight gain, no weight loss.  EYES: No blurry or double vision.  ENT: No tinnitus. No postnasal drip. No redness of the oropharynx.  RESPIRATORY: No cough, no wheeze, no hemoptysis. No dyspnea.  CARDIOVASCULAR: No chest pain. No orthopnea. No palpitations. No syncope.  GASTROINTESTINAL: No nausea, no vomiting or diarrhea.  Positive abdominal pain. No melena or hematochezia.  GENITOURINARY: No dysuria or hematuria.  ENDOCRINE: No polyuria or nocturia. No heat or cold intolerance.  HEMATOLOGY: No anemia. No bruising. No bleeding.  INTEGUMENTARY: No rashes. No lesions.  MUSCULOSKELETAL: No arthritis. No swelling. No gout.  NEUROLOGIC: No numbness, tingling, or ataxia. No seizure-type activity.  PSYCHIATRIC: No anxiety. No insomnia. No ADD.    Vitals:   Vitals:   03/21/18 0330 03/21/18 0400 03/21/18 0430 03/21/18 0529  BP: 111/76 104/77 110/82 114/82  Pulse: 98 (!) 102 90 99  Resp: 14 14 16 20   Temp:    97.8 F (36.6 C)  TempSrc:    Oral  SpO2: 92% 93% 93% 95%  Weight:      Height:        Wt Readings from Last 3 Encounters:  03/20/18 98.7 kg  03/05/18 95.5 kg   02/15/18 94.4 kg     Intake/Output Summary (Last 24 hours) at 03/21/2018 1312 Last data filed at 03/21/2018 1200 Gross per 24 hour  Intake 1558.13 ml  Output 4605 ml  Net -3046.87 ml    Physical Exam:   GENERAL: Sitting up in chair, appears mildly uncomfortable HEENT: Atraumatic, normocephalic. Extraocular muscles are intact. Pupils equal and reactive to light. Sclerae anicteric. No conjunctival injection. No oro-pharyngeal erythema.  +NG tube in place. NECK: Supple. There is no jugular venous distention. No bruits, no lymphadenopathy, no thyromegaly.  HEART: Tachycardic, regular  rhythm, no murmurs clicks or gallops LUNGS: Decreased breath sounds bilaterally without any accessory muscle usage.  Nasal cannula in place. ABDOMEN: + Distention, + tenderness to palpation throughout the entire abdomen, no guarding or rebound.  EXTREMITIES: No evidence of any cyanosis, clubbing, or peripheral edema.  +2 pedal and radial pulses bilaterally.  NEUROLOGIC: The patient is alert, awake, and oriented x3 with no focal motor or sensory deficits appreciated bilaterally.  SKIN: Moist and warm with no rashes appreciated.  Psych: Not anxious, depressed  Antibiotics   Anti-infectives (From admission, onward)   Start     Dose/Rate Route Frequency Ordered Stop   03/19/18 0845  piperacillin-tazobactam (ZOSYN) IVPB 3.375 g     3.375 g 12.5 mL/hr over 240 Minutes Intravenous Every 8 hours 03/19/18 0835        Medications   Scheduled Meds: . atorvastatin  40 mg Oral Daily  . chlorhexidine  15 mL Mouth Rinse BID  . enoxaparin (LOVENOX) injection  40 mg Subcutaneous Q24H  . fenofibrate  160 mg Oral Daily  . FLUoxetine  40 mg Oral Daily  . fluticasone  2 spray Each Nare BID  . mouth rinse  15 mL Mouth Rinse q12n4p  . pantoprazole  40 mg Oral Daily  . polyethylene glycol  17 g Oral Daily  . senna-docusate  2 tablet Oral BID  . sodium chloride flush  3 mL Intravenous Q12H   Continuous Infusions: .  sodium chloride Stopped (03/19/18 2156)  . lactated ringers Stopped (03/21/18 0505)  . piperacillin-tazobactam (ZOSYN)  IV Stopped (03/21/18 1051)   PRN Meds:.sodium chloride, acetaminophen **OR** acetaminophen, albuterol, clonazePAM, metoCLOPramide (REGLAN) injection, morphine injection, promethazine   Data Review:   Micro Results Recent Results (from the past 240 hour(s))  CULTURE, BLOOD (ROUTINE X 2) w Reflex to ID Panel     Status: None (Preliminary result)   Collection Time: 03/19/18  9:16 AM  Result Value Ref Range Status   Specimen Description BLOOD Left thumb  Final   Special Requests   Final    BOTTLES DRAWN AEROBIC AND ANAEROBIC Blood Culture results may not be optimal due to an inadequate volume of blood received in culture bottles   Culture   Final    NO GROWTH 2 DAYS Performed at [facility], [address] [place], [place] [address]    Report Status PENDING  Incomplete  CULTURE, BLOOD (ROUTINE X 2) w Reflex to ID Panel     Status: None (Preliminary result)   Collection Time: 03/19/18  9:16 AM  Result Value Ref Range Status   Specimen Description BLOOD BLOOD LEFT HAND  Final   Special Requests   Final    BOTTLES DRAWN AEROBIC AND ANAEROBIC Blood Culture adequate volume   Culture   Final    NO GROWTH 2 DAYS Performed at [facility], [address] [place], [place] [address]    Report Status PENDING  Incomplete  MRSA PCR Screening     Status: None   Collection Time: 03/19/18  4:50 PM  Result Value Ref Range Status   MRSA by PCR NEGATIVE NEGATIVE Final    Comment:        The GeneXpert MRSA Assay (FDA approved for NASAL specimens only), is one component of a comprehensive MRSA colonization surveillance program. It is not intended to diagnose MRSA infection nor to guide or monitor treatment for MRSA infections. Performed at [facility], [address] [place], [place] [address]     Radiology  Reports Dg Abd 1  View  Result Date: 03/19/2018 CLINICAL DATA:  Distended abdomen. EXAM: ABDOMEN - 1 VIEW COMPARISON:  03/19/2018 FINDINGS: Mild gaseous distention of the colon. There is no small bowel dilation to suggest obstruction. Soft tissues are unremarkable.  No acute skeletal abnormality. IMPRESSION: 1. No evidence of bowel obstruction.  No acute finding. 2. Mild gaseous distention of the colon. Electronically Signed   By: Amie Portland M.D.   On: 03/19/2018 16:37   Dg Abd 1 View  Result Date: 03/19/2018 CLINICAL DATA:  History of influenza.  Abdominal infection. EXAM: ABDOMEN - 1 VIEW COMPARISON:  CT abdomen pelvis 03/18/2018 FINDINGS: Bibasilar heterogeneous opacities. Contrast material within the urinary bladder. Gaseous distended loops of small bowel within the central abdomen. Gas within the colon. No free intraperitoneal air. IMPRESSION: Findings suggestive of ileus. Electronically Signed   By: Annia Belt M.D.   On: 03/19/2018 09:10   Ct Chest Wo Contrast  Result Date: 03/19/2018 CLINICAL DATA:  43 y/o F; acute respiratory failure in the setting of acute pancreatitis. EXAM: CT CHEST WITHOUT CONTRAST TECHNIQUE: Multidetector CT imaging of the chest was performed following the standard protocol without IV contrast. COMPARISON:  03/19/2018 chest radiograph. 03/18/2018 CT abdomen and pelvis. FINDINGS: Cardiovascular: No significant vascular findings. Normal heart size. No pericardial effusion. Mediastinum/Nodes: No enlarged mediastinal or axillary lymph nodes. Thyroid gland, trachea, and esophagus demonstrate no significant findings. Lungs/Pleura: Low lung volumes. Small left pleural effusion. Bibasilar consolidations. No findings of pulmonary edema. No pneumothorax. Upper Abdomen: Partially visualized edema within the upper abdomen. Musculoskeletal: No fracture is seen. IMPRESSION: 1. Progressed small left pleural effusion, low lung volumes, and bibasilar consolidations which probably represent atelectasis, less  likely pneumonia. Findings atypical for ARDS. 2. Stable partially visualized edema within the upper abdomen. Electronically Signed   By: Mitzi Hansen M.D.   On: 03/19/2018 16:23   Ct Abdomen Pelvis W Contrast  Result Date: 03/18/2018 CLINICAL DATA:  Abdominal pain with vomiting. History of pancreatitis. EXAM: CT ABDOMEN AND PELVIS WITH CONTRAST TECHNIQUE: Multidetector CT imaging of the abdomen and pelvis was performed using the standard protocol following bolus administration of intravenous contrast. CONTRAST:  OMNIPAQUE IOHEXOL 300 MG/ML  SOLN COMPARISON:  Abdominopelvic CT 02/10/2018 and 02/09/2018. FINDINGS: Lower chest: Bibasilar atelectasis has mildly improved. There is no significant pleural or pericardial effusion. Hepatobiliary: The hepatic density is within normal limits and no focal lesion identified. No significant biliary dilatation post cholecystectomy. Pancreas: The pancreas remains diffusely enlarged, although appears homogeneous without evidence of necrosis or acute hemorrhage. There is increasing peripancreatic inflammation and ill-defined fluid. No pancreatic ductal dilatation. Spleen: Normal in size without focal abnormality. Adrenals/Urinary Tract: Both adrenal glands appear normal. There are tiny cysts within the upper poles of both kidneys. No evidence of renal mass, urinary tract calculus or hydronephrosis. The bladder appears normal. Stomach/Bowel: No evidence of bowel wall thickening, distention or surrounding inflammatory change. The appendix appears normal. Moderate stool in the proximal colon. Vascular/Lymphatic: There are no enlarged abdominal or pelvic lymph nodes. Stable 12 mm portacaval node on image 35/2, likely reactive. The superior mesenteric and splenic veins are attenuated by the pancreatic inflammatory changes, although remain patent. No evidence of large vessel occlusion. Reproductive: Hysterectomy.  No evidence of adnexal mass. Other: Volume of pelvic  ascites has not significantly increased. As above, there is slightly greater inflammatory change and ill-defined fluid surrounding the pancreas and extending into both pericolic gutters. No focal fluid collections are identified. Musculoskeletal: No acute or  significant osseous findings. Severe discogenic sclerosis at L4-5. IMPRESSION: 1. Slight progression of extensive peripancreatic inflammatory changes with ill-defined fluid. No evidence of pancreatic hemorrhage, necrosis or focal fluid collection. 2. Attenuation of the splenic and superior mesenteric veins without evidence of large vessel occlusion. 3. Improved bibasilar atelectasis. Electronically Signed   By: Carey Bullocks M.D.   On: 03/18/2018 12:59   Dg Chest Port 1 View  Result Date: 03/19/2018 CLINICAL DATA:  Shortness of breath EXAM: PORTABLE CHEST 1 VIEW COMPARISON:  02/10/2018 FINDINGS: Low volume chest with streaky densities at the bases correlating with atelectasis by abdominal CT yesterday. No edema, effusion, or pneumothorax. Normal heart size and mediastinal contours. IMPRESSION: Low volume chest with bilateral lower lobe atelectasis. Electronically Signed   By: Marnee Spring M.D.   On: 03/19/2018 09:10   Dg Abd Portable 1v  Result Date: 03/19/2018 CLINICAL DATA:  Post NG tube placement EXAM: PORTABLE ABDOMEN - 1 VIEW COMPARISON:  03/19/2018 FINDINGS: Interval placement of nasogastric tube which decompresses the stomach. Nasogastric tube tip overlies the level of the mid stomach. Persistent gaseous distension of bowel loops noted. IMPRESSION: NG tube tip is in the stomach. Electronically Signed   By: Norva Pavlov M.D.   On: 03/19/2018 17:53     CBC Recent Labs  Lab 03/18/18 1140 03/19/18 0330 03/20/18 0357 03/21/18 0614  WBC 23.6* 28.2* 24.0* 20.8*  HGB 13.6 14.3 11.5* 9.1*  HCT 44.4 46.6* 37.2 29.4*  PLT 466* 476* 334 295  MCV 84.7 85.2 83.0 85.0  MCH 26.0 26.1 25.7* 26.3  MCHC 30.6 30.7 30.9 31.0  RDW 14.1 14.5  14.6 14.8    Chemistries  Recent Labs  Lab 03/18/18 1140 03/19/18 0330 03/20/18 0357 03/21/18 0614  NA 135 138 136 134*  K 3.2* 4.6 4.5 3.9  CL 97* 103 102 99  CO2 24 21* 22 26  GLUCOSE 187* 156* 173* 188*  BUN 14 24* 51* 51*  CREATININE 0.98 2.07* 3.23* 2.46*  CALCIUM 9.2 8.6* 8.4* 8.3*  MG  --   --   --  1.9  AST 86*  --  69* 34  ALT 36  --  57* 35  ALKPHOS 61  --  59 67  BILITOT 0.8  --  1.3* 0.9   ------------------------------------------------------------------------------------------------------------------ estimated creatinine clearance is 35.2 mL/min (A) (by C-G formula based on SCr of 2.46 mg/dL (H)). ------------------------------------------------------------------------------------------------------------------ No results for input(s): HGBA1C in the last 72 hours. ------------------------------------------------------------------------------------------------------------------ Recent Labs    03/19/18 1643  TRIG 322*   ------------------------------------------------------------------------------------------------------------------ No results for input(s): TSH, T4TOTAL, T3FREE, THYROIDAB in the last 72 hours.  Invalid input(s): FREET3 ------------------------------------------------------------------------------------------------------------------ No results for input(s): VITAMINB12, FOLATE, FERRITIN, TIBC, IRON, RETICCTPCT in the last 72 hours.  Coagulation profile No results for input(s): INR, PROTIME in the last 168 hours.  No results for input(s): DDIMER in the last 72 hours.  Cardiac Enzymes Recent Labs  Lab 03/19/18 0916 03/19/18 1402 03/19/18 2040  TROPONINI 0.07* 0.06* 0.06*   ------------------------------------------------------------------------------------------------------------------ Invalid input(s): POCBNP    Assessment & Plan   Acute hypoxic respiratory failure- likely due to gastric distention from pancreatitis with  atelectasis.  No signs of ARDS.  Currently on 5 L O2 by nasal cannula. -CT chest 2/13 with small left pleural effusion, low lung volumes, and bibasilar consolidations probably atelectasis. -Wean oxygen as able  Severe acute on chronic pancreatitis- abdominal pain is a little bit improved today. -GI following- recommend keeping patient n.p.o. and starting TPN, repeat CT abdomen when able  to assess for pancreatic necrosis -Will obtain MRCP when patient is a bit more stable, possibly tomorrow  Acute renal failure- due to IV contrast, hypotension, poor p.o. intake.  Creatinine continues to improve. -Continue IV fluids -Neurology following -Holding home Lasix  Fever- resolved. Last fever was 2/14 at 0400.  Leukocytosis improving slowly. -Seen by ID- felt fever was likely related to acute on chronic pancreatitis.  Recommended Zosyn for now.  Ileus- persistent.  Patient has not had flatus or a bowel movement. -N.p.o. -Continue NG tube -Start TPN  Hypertension- BPs well-controlled -Monitor  Chronic pain syndrome -Continue IV morphine while n.p.o.  DVT prophylaxis- lovenox     Code Status Orders  (From admission, onward)         Start     Ordered   03/18/18 1407  Full code  Continuous     03/18/18 1409        Code Status History    Date Active Date Inactive Code Status Order ID Comments User Context   02/09/2018 0921 02/15/2018 1549 Full Code [id number]  Barbaraann Rondo, MD Inpatient   11/28/2017 0352 12/04/2017 2009 Full Code [id number]  Barbaraann Rondo, MD Inpatient      Consults  Nephrology, gastroenterology, ID  DVT Prophylaxis lovenox  Lab Results  Component Value Date   PLT 295 03/21/2018     Time Spent in minutes   45 minutes  Greater than 50% of time spent in care coordination and counseling patient regarding the condition and plan of care.   Jinny Blossom  M.D on 03/21/2018 at 1:12 PM  Between 7am to 6pm - Pager - [phone]  After 6pm go to  [email or web address] - Social research officer, government  [employer]   Office  [phone]

## 2018-03-21 NOTE — Progress Notes (Signed)
[facility] Gastroenterology Inpatient Progress Note  Subjective: Patient seen for f/u acute on chronic pancreatitis. Patient still with ileus, denying passing flatus. Reports she has not had a bm since hospital admission on 03/18/2018.  Objective: Vital signs in last 24 hours: Temp:  [97.8 F (36.6 C)-99 F (37.2 C)] 97.8 F (36.6 C) (02/15 0529) Pulse Rate:  [90-116] 99 (02/15 0529) Resp:  [14-20] 20 (02/15 0529) BP: (99-114)/(65-86) 114/82 (02/15 0529) SpO2:  [91 %-96 %] 95 % (02/15 0529) FiO2 (%):  [40 %] 40 % (02/14 2033) Blood pressure 114/82, pulse 99, temperature 97.8 F (36.6 C), temperature source Oral, resp. rate 20, height 5\' 7"  (1.702 m), weight 98.7 kg, SpO2 95 %.    Intake/Output from previous day: 02/14 0701 - 02/15 0700 In: 679.5 [I.V.:628.3; IV Piggyback:51.2] Out: 4000 [Urine:1900; Emesis/NG output:2100]  Intake/Output this shift: Total I/O In: 1238.5 [I.V.:1101; IV Piggyback:137.5] Out: 1050 [Urine:850; Emesis/NG output:200]   General appearance:  Ill-appearing, NG in.  Resp: Coarse breath sounds bilaterally.  Cardio: slightly tachy. NO gallop. GI:  Soft, distended, BS absent. Extremities:  Trace edema.   Lab Results: Results for orders placed or performed during the hospital encounter of 03/18/18 (from the past 24 hour(s))  CBC     Status: Abnormal   Collection Time: 03/21/18  6:14 AM  Result Value Ref Range   WBC 20.8 (H) 4.0 - 10.5 K/uL   RBC 3.46 (L) 3.87 - 5.11 MIL/uL   Hemoglobin 9.1 (L) 12.0 - 15.0 g/dL   HCT 76.1 (L) 60.7 - 37.1 %   MCV 85.0 80.0 - 100.0 fL   MCH 26.3 26.0 - 34.0 pg   MCHC 31.0 30.0 - 36.0 g/dL   RDW 06.2 69.4 - 85.4 %   Platelets 295 150 - 400 K/uL   nRBC 0.1 0.0 - 0.2 %  Comprehensive metabolic panel     Status: Abnormal   Collection Time: 03/21/18  6:14 AM  Result Value Ref Range   Sodium 134 (L) 135 - 145 mmol/L   Potassium 3.9 3.5 - 5.1 mmol/L   Chloride 99 98 - 111 mmol/L   CO2 26 22 - 32 mmol/L    Glucose, Bld 188 (H) 70 - 99 mg/dL   BUN 51 (H) 6 - 20 mg/dL   Creatinine, Ser 6.27 (H) 0.44 - 1.00 mg/dL   Calcium 8.3 (L) 8.9 - 10.3 mg/dL   Total Protein 6.5 6.5 - 8.1 g/dL   Albumin 2.4 (L) 3.5 - 5.0 g/dL   AST 34 15 - 41 U/L   ALT 35 0 - 44 U/L   Alkaline Phosphatase 67 38 - 126 U/L   Total Bilirubin 0.9 0.3 - 1.2 mg/dL   GFR calc non Af Amer 23 (L) >60 mL/min   GFR calc Af Amer 27 (L) >60 mL/min   Anion gap 9 5 - 15  Lipase, blood     Status: Abnormal   Collection Time: 03/21/18  6:14 AM  Result Value Ref Range   Lipase 76 (H) 11 - 51 U/L  Magnesium     Status: None   Collection Time: 03/21/18  6:14 AM  Result Value Ref Range   Magnesium 1.9 1.7 - 2.4 mg/dL  Phosphorus     Status: None   Collection Time: 03/21/18  6:14 AM  Result Value Ref Range   Phosphorus 3.3 2.5 - 4.6 mg/dL     Recent Labs    03/50/09 0330 03/20/18 0357 03/21/18 0614  WBC 28.2* 24.0* 20.8*  HGB 14.3 11.5* 9.1*  HCT 46.6* 37.2 29.4*  PLT 476* 334 295   BMET Recent Labs    03/19/18 0330 03/20/18 0357 03/21/18 0614  NA 138 136 134*  K 4.6 4.5 3.9  CL 103 102 99  CO2 21* 22 26  GLUCOSE 156* 173* 188*  BUN 24* 51* 51*  CREATININE 2.07* 3.23* 2.46*  CALCIUM 8.6* 8.4* 8.3*   LFT Recent Labs    03/20/18 0357 03/21/18 0614  PROT 6.5 6.5  ALBUMIN 2.6* 2.4*  AST 69* 34  ALT 57* 35  ALKPHOS 59 67  BILITOT 1.3* 0.9  BILIDIR 0.6*  --   IBILI 0.7  --    PT/INR No results for input(s): LABPROT, INR in the last 72 hours. Hepatitis Panel No results for input(s): HEPBSAG, HCVAB, HEPAIGM, HEPBIGM in the last 72 hours. C-Diff No results for input(s): CDIFFTOX in the last 72 hours. No results for input(s): CDIFFPCR in the last 72 hours.   Studies/Results: Dg Abd 1 View  Result Date: 03/19/2018 CLINICAL DATA:  Distended abdomen. EXAM: ABDOMEN - 1 VIEW COMPARISON:  03/19/2018 FINDINGS: Mild gaseous distention of the colon. There is no small bowel dilation to suggest obstruction. Soft  tissues are unremarkable.  No acute skeletal abnormality. IMPRESSION: 1. No evidence of bowel obstruction.  No acute finding. 2. Mild gaseous distention of the colon. Electronically Signed   By: Amie Portland M.D.   On: 03/19/2018 16:37   Ct Chest Wo Contrast  Result Date: 03/19/2018 CLINICAL DATA:  42 y/o F; acute respiratory failure in the setting of acute pancreatitis. EXAM: CT CHEST WITHOUT CONTRAST TECHNIQUE: Multidetector CT imaging of the chest was performed following the standard protocol without IV contrast. COMPARISON:  03/19/2018 chest radiograph. 03/18/2018 CT abdomen and pelvis. FINDINGS: Cardiovascular: No significant vascular findings. Normal heart size. No pericardial effusion. Mediastinum/Nodes: No enlarged mediastinal or axillary lymph nodes. Thyroid gland, trachea, and esophagus demonstrate no significant findings. Lungs/Pleura: Low lung volumes. Small left pleural effusion. Bibasilar consolidations. No findings of pulmonary edema. No pneumothorax. Upper Abdomen: Partially visualized edema within the upper abdomen. Musculoskeletal: No fracture is seen. IMPRESSION: 1. Progressed small left pleural effusion, low lung volumes, and bibasilar consolidations which probably represent atelectasis, less likely pneumonia. Findings atypical for ARDS. 2. Stable partially visualized edema within the upper abdomen. Electronically Signed   By: Mitzi Hansen M.D.   On: 03/19/2018 16:23   Dg Abd Portable 1v  Result Date: 03/19/2018 CLINICAL DATA:  Post NG tube placement EXAM: PORTABLE ABDOMEN - 1 VIEW COMPARISON:  03/19/2018 FINDINGS: Interval placement of nasogastric tube which decompresses the stomach. Nasogastric tube tip overlies the level of the mid stomach. Persistent gaseous distension of bowel loops noted. IMPRESSION: NG tube tip is in the stomach. Electronically Signed   By: Norva Pavlov M.D.   On: 03/19/2018 17:53    Scheduled Inpatient Medications:   . atorvastatin  40 mg  Oral Daily  . chlorhexidine  15 mL Mouth Rinse BID  . enoxaparin (LOVENOX) injection  40 mg Subcutaneous Q24H  . fenofibrate  160 mg Oral Daily  . FLUoxetine  40 mg Oral Daily  . fluticasone  2 spray Each Nare BID  . mouth rinse  15 mL Mouth Rinse q12n4p  . pantoprazole  40 mg Oral Daily  . polyethylene glycol  17 g Oral Daily  . senna-docusate  2 tablet Oral BID  . sodium chloride flush  3 mL Intravenous Q12H    Continuous Inpatient Infusions:   .  sodium chloride Stopped (03/19/18 2156)  . lactated ringers Stopped (03/21/18 0505)  . piperacillin-tazobactam (ZOSYN)  IV Stopped (03/21/18 1051)    PRN Inpatient Medications:  sodium chloride, acetaminophen **OR** acetaminophen, albuterol, clonazePAM, metoCLOPramide (REGLAN) injection, morphine injection, promethazine  Miscellaneous: N/A  Assessment:  1. Acute on chronic pancreatitis with leukocytosis and CT on 03/18/2018 showing progression in the severity of the pancreatitis. This patient has a clear liquid diet ordered in the setting of adynamic ileus. This is not helping the patient.   2. ARDS picture on CT of chest with increased left pleural effusion.  3. Chronic pain syndrome.   4. Sepsis - ID believes secondary to acute on chronic pancreatitis. Needs followup to assess for pancreatic necrosis. CT with IV contrast apparently caused ATN and ARF. CT not likely feasible to reorder.    Plan:  1. Strict NPO. 2. Obtain dietician recommendations for TPN. Begin today if possible.  3. Repeat CT when feasible to assess for pancreatic necrosis. 4. Switch feedings to DHT beyond ligament of Treitz once ileus resolves.  5. Following.   Cinque Begley K. Norma Fredricksonoledo, M.D. 03/21/2018, 1:03 PM

## 2018-03-21 NOTE — Progress Notes (Signed)
Anticoagulation monitoring(Lovenox):  42yo  F ordered Lovenox 30 mg Q24h  Filed Weights   03/19/18 0500 03/19/18 1627 03/20/18 0238  Weight: 218 lb 4.8 oz (99 kg) 216 lb 4.3 oz (98.1 kg) 217 lb 9.5 oz (98.7 kg)   BMI 34   Lab Results  Component Value Date   CREATININE 2.46 (H) 03/21/2018   CREATININE 3.23 (H) 03/20/2018   CREATININE 2.07 (H) 03/19/2018   Estimated Creatinine Clearance: 35.2 mL/min (A) (by C-G formula based on SCr of 2.46 mg/dL (H)). Hemoglobin & Hematocrit     Component Value Date/Time   HGB 9.1 (L) 03/21/2018 0614   HGB 10.5 (L) 03/10/2018 1827   HCT 29.4 (L) 03/21/2018 0614   HCT 31.6 (L) 03/10/2018 1827     Per Protocol for Patient with estCrcl now > 30 ml/min and BMI < 40, will transition to Lovenox 40 mg Q24h      Bari Mantis PharmD Clinical Pharmacist 03/21/2018

## 2018-03-22 ENCOUNTER — Inpatient Hospital Stay: Payer: Self-pay

## 2018-03-22 LAB — BASIC METABOLIC PANEL
Anion gap: 10 (ref 5–15)
BUN: 35 mg/dL — ABNORMAL HIGH (ref 6–20)
CO2: 29 mmol/L (ref 22–32)
Calcium: 8.4 mg/dL — ABNORMAL LOW (ref 8.9–10.3)
Chloride: 97 mmol/L — ABNORMAL LOW (ref 98–111)
Creatinine, Ser: 1.65 mg/dL — ABNORMAL HIGH (ref 0.44–1.00)
GFR calc Af Amer: 43 mL/min — ABNORMAL LOW (ref >60)
GFR calc non Af Amer: 37 mL/min — ABNORMAL LOW (ref >60)
GLUCOSE: 168 mg/dL — AB (ref 70–99)
Potassium: 3.6 mmol/L (ref 3.5–5.1)
Sodium: 136 mmol/L (ref 135–145)

## 2018-03-22 LAB — CBC
HCT: 30.3 % — ABNORMAL LOW (ref 36.0–46.0)
Hemoglobin: 9.3 g/dL — ABNORMAL LOW (ref 12.0–15.0)
MCH: 25.5 pg — ABNORMAL LOW (ref 26.0–34.0)
MCHC: 30.7 g/dL (ref 30.0–36.0)
MCV: 83 fL (ref 80.0–100.0)
Platelets: 316 10*3/uL (ref 150–400)
RBC: 3.65 MIL/uL — ABNORMAL LOW (ref 3.87–5.11)
RDW: 14.6 % (ref 11.5–15.5)
WBC: 20.9 10*3/uL — ABNORMAL HIGH (ref 4.0–10.5)
nRBC: 0.2 % (ref 0.0–0.2)

## 2018-03-22 LAB — GLUCOSE, CAPILLARY
Glucose-Capillary: 132 mg/dL — ABNORMAL HIGH (ref 70–99)
Glucose-Capillary: 153 mg/dL — ABNORMAL HIGH (ref 70–99)

## 2018-03-22 MED ORDER — SODIUM CHLORIDE 0.9% FLUSH
10.0000 mL | Freq: Two times a day (BID) | INTRAVENOUS | Status: DC
  Administered 2018-03-23 – 2018-03-27 (×7): 10 mL

## 2018-03-22 MED ORDER — MORPHINE SULFATE (PF) 2 MG/ML IV SOLN
2.0000 mg | INTRAVENOUS | Status: DC | PRN
  Administered 2018-03-22 – 2018-03-27 (×23): 2 mg via INTRAVENOUS
  Filled 2018-03-22 (×23): qty 1

## 2018-03-22 MED ORDER — SODIUM CHLORIDE 0.9% FLUSH: 10.0000 mL | INTRAVENOUS | Status: DC | PRN

## 2018-03-22 MED ORDER — PHENOL 1.4 % MT LIQD
1.0000 | OROMUCOSAL | Status: DC | PRN
  Filled 2018-03-22: qty 177

## 2018-03-22 MED ORDER — FAT EMULSION PLANT BASED 20 % IV EMUL
250.0000 mL | INTRAVENOUS | Status: AC
  Administered 2018-03-22: 250 mL via INTRAVENOUS
  Filled 2018-03-22: qty 250

## 2018-03-22 MED ORDER — TRACE MINERALS CR-CU-MN-SE-ZN 10-1000-500-60 MCG/ML IV SOLN
INTRAVENOUS | Status: AC
  Administered 2018-03-22: 19:00:00 via INTRAVENOUS
  Filled 2018-03-22: qty 960

## 2018-03-22 MED ORDER — INSULIN ASPART 100 UNIT/ML ~~LOC~~ SOLN
0.0000 [IU] | SUBCUTANEOUS | Status: DC
  Administered 2018-03-22: 1 [IU] via SUBCUTANEOUS
  Administered 2018-03-22: 2 [IU] via SUBCUTANEOUS
  Administered 2018-03-23: 7 [IU] via SUBCUTANEOUS
  Administered 2018-03-23: 5 [IU] via SUBCUTANEOUS
  Administered 2018-03-23 (×2): 3 [IU] via SUBCUTANEOUS
  Administered 2018-03-23: 5 [IU] via SUBCUTANEOUS
  Administered 2018-03-23: 3 [IU] via SUBCUTANEOUS
  Administered 2018-03-24 (×2): 9 [IU] via SUBCUTANEOUS
  Administered 2018-03-24: 3 [IU] via SUBCUTANEOUS
  Administered 2018-03-24: 5 [IU] via SUBCUTANEOUS
  Filled 2018-03-22 (×12): qty 1

## 2018-03-22 NOTE — Progress Notes (Signed)
[facility]  ROUNDING NOTE   Subjective:   Patient placed back on NPO. Plan on TPN. PICC line scheduled.   Patient states she is still having abdominal pain. Endorses flatus this morning.  Creatinine 1.65 (2.46)  UOP (1900)  Objective:  Vital signs in last 24 hours:  Temp:  [98 F (36.7 C)-99.9 F (37.7 C)] 99.9 F (37.7 C) (02/16 1138) Pulse Rate:  [81-99] 85 (02/16 1138) Resp:  [18-24] 18 (02/16 1138) BP: (109-132)/(62-90) 119/62 (02/16 1138) SpO2:  [96 %-99 %] 99 % (02/16 1138) Weight:  [98.2 kg] 98.2 kg (02/16 0423)  Weight change:  Filed Weights   03/19/18 1627 03/20/18 0238 03/22/18 0423  Weight: 98.1 kg 98.7 kg 98.2 kg    Intake/Output: I/O last 3 completed shifts: In: 1241.5 [I.V.:1104; IV Piggyback:137.5] Out: 5850 [Urine:3850; Emesis/NG output:2000]   Intake/Output this shift:  Total I/O In: -  Out: 850 [Urine:850]  Physical Exam: General: NAD,   Head: NGT  Eyes: Anicteric, PERRL  Neck: Supple, trachea midline  Lungs:  Clear to auscultation  Heart: Regular rate and rhythm  Abdomen:  +tender, obese, distended  Extremities:  no peripheral edema.  Neurologic: Nonfocal, moving all four extremities  Skin: No lesions  GU: +foley    Basic Metabolic Panel: Recent Labs  Lab 03/18/18 1140 03/19/18 0330 03/20/18 0357 03/21/18 0614 03/22/18 0353  NA 135 138 136 134* 136  K 3.2* 4.6 4.5 3.9 3.6  CL 97* 103 102 99 97*  CO2 24 21* 22 26 29   GLUCOSE 187* 156* 173* 188* 168*  BUN 14 24* 51* 51* 35*  CREATININE 0.98 2.07* 3.23* 2.46* 1.65*  CALCIUM 9.2 8.6* 8.4* 8.3* 8.4*  MG  --   --   --  1.9  --   PHOS  --   --   --  3.3  --     Liver Function Tests: Recent Labs  Lab 03/18/18 1140 03/20/18 0357 03/21/18 0614  AST 86* 69* 34  ALT 36 57* 35  ALKPHOS 61 59 67  BILITOT 0.8 1.3* 0.9  PROT 8.3* 6.5 6.5  ALBUMIN 4.2 2.6* 2.4*   Recent Labs  Lab 03/18/18 1140 03/19/18 0330 03/20/18 0357 03/21/18 0614  LIPASE 1,044*  888* 341* 76*   No results for input(s): AMMONIA in the last 168 hours.  CBC: Recent Labs  Lab 03/18/18 1140 03/19/18 0330 03/20/18 0357 03/21/18 0614 03/22/18 0353  WBC 23.6* 28.2* 24.0* 20.8* 20.9*  HGB 13.6 14.3 11.5* 9.1* 9.3*  HCT 44.4 46.6* 37.2 29.4* 30.3*  MCV 84.7 85.2 83.0 85.0 83.0  PLT 466* 476* 334 295 316    Cardiac Enzymes: Recent Labs  Lab 03/19/18 0916 03/19/18 1402 03/19/18 2040  TROPONINI 0.07* 0.06* 0.06*    BNP: Invalid input(s): POCBNP  CBG: Recent Labs  Lab 03/19/18 1639  GLUCAP 176*    Microbiology: Results for orders placed or performed during the hospital encounter of 03/18/18  CULTURE, BLOOD (ROUTINE X 2) w Reflex to ID Panel     Status: None (Preliminary result)   Collection Time: 03/19/18  9:16 AM  Result Value Ref Range Status   Specimen Description BLOOD Left thumb  Final   Special Requests   Final    BOTTLES DRAWN AEROBIC AND ANAEROBIC Blood Culture results may not be optimal due to an inadequate volume of blood received in culture bottles   Culture   Final    NO GROWTH 3 DAYS Performed at [facility], [address] [place], [place] [address]    Report Status PENDING  Incomplete  CULTURE, BLOOD (ROUTINE X 2) w Reflex to ID Panel     Status: None (Preliminary result)   Collection Time: 03/19/18  9:16 AM  Result Value Ref Range Status   Specimen Description BLOOD BLOOD LEFT HAND  Final   Special Requests   Final    BOTTLES DRAWN AEROBIC AND ANAEROBIC Blood Culture adequate volume   Culture   Final    NO GROWTH 3 DAYS Performed at [facility], [address] [place], [place] [address]    Report Status PENDING  Incomplete  MRSA PCR Screening     Status: None   Collection Time: 03/19/18  4:50 PM  Result Value Ref Range Status   MRSA by PCR NEGATIVE NEGATIVE Final    Comment:        The GeneXpert MRSA Assay (FDA approved for NASAL specimens only), is one component of a comprehensive MRSA  colonization surveillance program. It is not intended to diagnose MRSA infection nor to guide or monitor treatment for MRSA infections. Performed at [facility], [address] [place], [place] [address]     Coagulation Studies: No results for input(s): LABPROT, INR in the last 72 hours.  Urinalysis: No results for input(s): COLORURINE, LABSPEC, PHURINE, GLUCOSEU, HGBUR, BILIRUBINUR, KETONESUR, PROTEINUR, UROBILINOGEN, NITRITE, LEUKOCYTESUR in the last 72 hours.  Invalid input(s): APPERANCEUR    Imaging: [employer] Site Rite  Result Date: 03/22/2018 If Site Rite image not attached, placement could not be confirmed due to current cardiac rhythm.    Medications:   . sodium chloride Stopped (03/19/18 2156)  . Marland Kitchen.TPN (CLINIMIX-E) Adult     And  . Fat emulsion    . lactated ringers 75 mL/hr at 03/22/18 0536  . piperacillin-tazobactam (ZOSYN)  IV 3.375 g (03/22/18 0536)   . atorvastatin  40 mg Oral Daily  . chlorhexidine  15 mL Mouth Rinse BID  . enoxaparin (LOVENOX) injection  40 mg Subcutaneous Q24H  . fenofibrate  160 mg Oral Daily  . FLUoxetine  40 mg Oral Daily  . fluticasone  2 spray Each Nare BID  . insulin aspart  0-9 Units Subcutaneous Q4H  . mouth rinse  15 mL Mouth Rinse q12n4p  . pantoprazole  40 mg Oral Daily  . polyethylene glycol  17 g Oral Daily  . senna-docusate  2 tablet Oral BID  . sodium chloride flush  3 mL Intravenous Q12H   sodium chloride, acetaminophen **OR** acetaminophen, albuterol, clonazePAM, metoCLOPramide (REGLAN) injection, morphine injection, phenol, promethazine  Assessment/ Plan:  Terri Berry is a 43 y.o. white nonhispanic female with morbid obesity, hypertension, depression, anxiety, GERD who was [facility] 03/18/2018 for acute pancreatitis.   1. Acute renal failure: with IV contrast exposure on 2/12, hypotension, poor PO intake, use of NSAIDs. Ketorolac given in ED as well.  Creatinine peaked at 3.23.   Creatinine on admission of 0.98, acute rise. Nonoliguric.  Foley catheter placed.  Most likely ATN from multifactorial etiologies. Creatinine trending down.  Hold home furosemide dose - IV fluids - Monitor volume status and urine output  2. Sepsis/hypotension with lactic acidosis: febrile with leukocytosis. Blood cultures are negative so far.  Secondary to acute pancreatitis.  - empiric zosyn.  - Appreciate ID input.   3. Acute pancreatitis: Continue supportive care. Patient is cleared for PICC placement.  - TPN ordered - monitor electrolytes.  - Appreciate GI input.  LOS: 4 Cristo Ausburn 2/16/202012:18 PM

## 2018-03-22 NOTE — Plan of Care (Signed)
Patient continues to be NPO, still has extensive abdominal distension, NG tube in place. Pain remains in spite of administration of pain medications. Has not had a bowel movement, but is passing gas.

## 2018-03-22 NOTE — Progress Notes (Signed)
PHARMACY - ADULT TOTAL PARENTERAL NUTRITION CONSULT NOTE   Pharmacy Consult for TPN Indication: Prolonged ileus and severe acute pancreatitis        Patient Measurements: Height: 5\' 7"  (170.2 cm) Weight: 213 lb 10 oz (96.9 kg) IBW/kg (Calculated) : 61.6 TPN AdjBW (KG): 70.7 Body mass index is 33.46 kg/m. Usual Weight:    Assessment:   GI: NPO Endo:  Insulin requirements in the past 24 hours: started SSI Lytes:WNL Renal:Scr 1.65 Pulm: Cards:  Hepatobil: Neuro: TD:SKAJG  TPN Access: to get PICC today 2/16? TPN start date: ?2/16 Nutritional Goals (per RD recommendation on ):initiate Clinimix E 5/20 at 40 mL/hr over 24 hours + 20% ILE at 20 mL/hr over 12 hours.  After 24 hours if electrolytes, CBGs, and triglycerides are within acceptable range, advance to goal TPN regimen of Clinimix E 5/20 at 83 mL/hr over 24 hours 2233 kcal, 100 grams of protein, 1992 mL fluid   Goal TPN rate is 83 ml/hr   Current Nutrition: NPO  Plan:  Clinimix E 5/20 at 40 mL/hr  TPN  Electrolytes in TPN: yes but no additonal than what comes premade Add MVI, trace elements  to TPN SSI- adjust as needed LR IVMF (D5, NS, etc.) at 75 ml/hr Monitor TPN labs,   F/U in am  Ruble Pumphrey A 03/22/2018,2:42 PM

## 2018-03-22 NOTE — Progress Notes (Signed)
[facility] Gastroenterology Inpatient Progress Note  Subjective: Patient seen for f/u severe acute pancreatitis. Patient feels "worse". Claims to have had some flatus today. Still very distended and uncomfortable. NG not returning much per RN, Sue Lush.  Appreciate RD consultation and recommendations. PICC ordered by Dr. Nancy Marus.   Objective: Vital signs in last 24 hours: Temp:  [98 F (36.7 C)-99.9 F (37.7 C)] 99.9 F (37.7 C) (02/16 1138) Pulse Rate:  [81-97] 85 (02/16 1138) Resp:  [18-24] 18 (02/16 1138) BP: (109-132)/(62-90) 119/62 (02/16 1138) SpO2:  [96 %-99 %] 99 % (02/16 1138) Weight:  [96.9 kg-98.2 kg] 96.9 kg (02/16 1112) Blood pressure 119/62, pulse 85, temperature 99.9 F (37.7 C), temperature source Oral, resp. rate 18, height 5\' 7"  (1.702 m), weight 96.9 kg, SpO2 99 %.    Intake/Output from previous day: 02/15 0701 - 02/16 0700 In: 1238.5 [I.V.:1101; IV Piggyback:137.5] Out: 3750 [Urine:3000; Emesis/NG output:750]  Intake/Output this shift: Total I/O In: -  Out: 1105 [Urine:1050; Emesis/NG output:55]   General appearance:  Ill and toxic appearing in mild distress, uncomfortable, tachypneic. Resp:  Decreased breath sounds in bases. Cardio:  Sl tachy, no gallop GI:  Markedly distended, diffusely tender with mild guarding, no rebound. BS hypoactive. Some bowel sounds are present. Extremities:  1-2 + edema.   Lab Results: Results for orders placed or performed during the hospital encounter of 03/18/18 (from the past 24 hour(s))  Basic metabolic panel     Status: Abnormal   Collection Time: 03/22/18  3:53 AM  Result Value Ref Range   Sodium 136 135 - 145 mmol/L   Potassium 3.6 3.5 - 5.1 mmol/L   Chloride 97 (L) 98 - 111 mmol/L   CO2 29 22 - 32 mmol/L   Glucose, Bld 168 (H) 70 - 99 mg/dL   BUN 35 (H) 6 - 20 mg/dL   Creatinine, Ser 5.59 (H) 0.44 - 1.00 mg/dL   Calcium 8.4 (L) 8.9 - 10.3 mg/dL   GFR calc non Af Amer 37 (L) >60 mL/min   GFR calc Af Amer 43  (L) >60 mL/min   Anion gap 10 5 - 15  CBC     Status: Abnormal   Collection Time: 03/22/18  3:53 AM  Result Value Ref Range   WBC 20.9 (H) 4.0 - 10.5 K/uL   RBC 3.65 (L) 3.87 - 5.11 MIL/uL   Hemoglobin 9.3 (L) 12.0 - 15.0 g/dL   HCT 74.1 (L) 63.8 - 45.3 %   MCV 83.0 80.0 - 100.0 fL   MCH 25.5 (L) 26.0 - 34.0 pg   MCHC 30.7 30.0 - 36.0 g/dL   RDW 64.6 80.3 - 21.2 %   Platelets 316 150 - 400 K/uL   nRBC 0.2 0.0 - 0.2 %     Recent Labs    03/20/18 0357 03/21/18 0614 03/22/18 0353  WBC 24.0* 20.8* 20.9*  HGB 11.5* 9.1* 9.3*  HCT 37.2 29.4* 30.3*  PLT 334 295 316   BMET Recent Labs    03/20/18 0357 03/21/18 0614 03/22/18 0353  NA 136 134* 136  K 4.5 3.9 3.6  CL 102 99 97*  CO2 22 26 29   GLUCOSE 173* 188* 168*  BUN 51* 51* 35*  CREATININE 3.23* 2.46* 1.65*  CALCIUM 8.4* 8.3* 8.4*   LFT Recent Labs    03/20/18 0357 03/21/18 0614  PROT 6.5 6.5  ALBUMIN 2.6* 2.4*  AST 69* 34  ALT 57* 35  ALKPHOS 59 67  BILITOT 1.3* 0.9  BILIDIR 0.6*  --  IBILI 0.7  --    PT/INR No results for input(s): LABPROT, INR in the last 72 hours. Hepatitis Panel No results for input(s): HEPBSAG, HCVAB, HEPAIGM, HEPBIGM in the last 72 hours. C-Diff No results for input(s): CDIFFTOX in the last 72 hours. No results for input(s): CDIFFPCR in the last 72 hours.   Studies/Results: [place] [employer]  Result Date: 03/22/2018 If [employer] not attached, placement could not be confirmed due to current cardiac rhythm.   Scheduled Inpatient Medications:   . atorvastatin  40 mg Oral Daily  . chlorhexidine  15 mL Mouth Rinse BID  . enoxaparin (LOVENOX) injection  40 mg Subcutaneous Q24H  . fenofibrate  160 mg Oral Daily  . FLUoxetine  40 mg Oral Daily  . fluticasone  2 spray Each Nare BID  . insulin aspart  0-9 Units Subcutaneous Q4H  . mouth rinse  15 mL Mouth Rinse q12n4p  . pantoprazole  40 mg Oral Daily  . polyethylene glycol  17 g Oral Daily  . senna-docusate  2 tablet  Oral BID  . sodium chloride flush  3 mL Intravenous Q12H    Continuous Inpatient Infusions:   . sodium chloride Stopped (03/19/18 2156)  . Marland KitchenTPN (CLINIMIX-E) Adult     And  . Fat emulsion    . lactated ringers 75 mL/hr at 03/22/18 0536  . piperacillin-tazobactam (ZOSYN)  IV 3.375 g (03/22/18 0536)    PRN Inpatient Medications:  sodium chloride, acetaminophen **OR** acetaminophen, albuterol, clonazePAM, metoCLOPramide (REGLAN) injection, morphine injection, phenol, promethazine  Assessment:  1. Acute on chronic pancreatitis with leukocytosis and CT on 03/18/2018 showing progression in the severity of the pancreatitis. Patient made NPO by me yesterday. TPN ordered.  2. ARDS picture on CT of chest with increased left pleural effusion.  3. Chronic pain syndrome.   4. Sepsis - ID believes secondary to acute on chronic pancreatitis. Needs followup to assess for pancreatic necrosis. CT with IV contrast apparently caused ATN and ARF. CT may hopefully be ordered in next 24 hours if Creatinine comes down further, per Nephrologist Dr. Wynelle Link (per Dr. Nancy Marus).    Plan:  1. Proceed with TPN as planned. 2. Maintain NPO status. 3. Follow up CT with pancreas protocol with contrast when clinically feasible. Awaiting Nephrology approval. 4. Switch feedings to DHT beyond ligament of Treitz once ileus convincingly resolves.  5. I shared my concern with Dr. Nancy Marus this patient appears to be declining and may need to return to ICU soon if does not turn around soon clinically. 6. Patient Aunt and Father have requested patient to be transferred to [facility] - per Dr. Nancy Marus the attending physician, beds are not currently available. I advised family to discuss further with Dr. Nancy Marus or Attending physician of the day.  7. Following closely.  Kortlyn Koltz K. Norma Fredrickson, M.D. 03/22/2018, 2:03 PM

## 2018-03-22 NOTE — Progress Notes (Signed)
Initial Nutrition Assessment  DOCUMENTATION CODES:   Obesity unspecified  INTERVENTION:  Once PICC placed and confirmed, initiate Clinimix E 5/20 at 40 mL/hr over 24 hours + 20% ILE at 20 mL/hr over 12 hours.  After 24 hours if electrolytes, CBGs, and triglycerides are within acceptable range, advance to goal TPN regimen of Clinimix E 5/20 at 83 mL/hr over 24 hours + 20% ILE at 20 mL/hr over 12 hours. Provides 2233 kcal, 100 grams of protein, 1992 mL fluid from Clinimix and 240 mL fluid from lipids daily.  Provide adult MVI and trace elements as daily TPN additives.  Consider decreasing LR fluids with initiation and advancement of TPN.  Agree with GI recommendation that once ileus resolves the best way to feed patient with severe acute pancreatitis will be to replace large-bore NGT with a small-bore, flexible NGT (Dobbhoff) that terminates at or beyond ligament of Treitz (will likely need to be placed by IR) for initiation of enteral nutrition. Enteral nutrition in pancreatitis helps attenuate severity of disease.  NUTRITION DIAGNOSIS:   Inadequate oral intake related to inability to eat as evidenced by NPO status(ileus).  GOAL:   Patient will meet greater than or equal to 90% of their needs  MONITOR:   Diet advancement, Labs, Weight trends, Skin, I & O's  REASON FOR ASSESSMENT:   Consult Assessment of nutrition requirement/status, New TPN/TNA  ASSESSMENT:   43 year old female with PMHx of anxiety, depression, GERD, HTN, IBS, hx cholecystectomy, hx abdominal hysterectomy admitted with severe acute on chronic pancreatitis, acute hypoxic respiratory failure, acute renal failure, also with persistent ileus.   Met with patient at bedside. Abdomen distended and taut. She reports she her last real meal was on 2/10. She sipped on liquids 2/11 but could not tolerate very well and was admitted on 2/12. She endorses abdominal pain, nausea, distention. Her last BM was PTA. She did pass  some flatus this AM. Discussed nutrition plan for now is for TPN until it is safe to feed enterally again. Discussed what TPN is and how it works.   Patient reports she is weight-stable and has not lost any weight. Currently 98.2 kg (216.49 lbs).  IV Access: pending order for PICC placement  Enteral Access: 18 Fr. NGT placed 03/19/2018; terminates in mid-stomach per abdominal x-ray 2/13; 60 cm at right nare; currently to LIS; 550 mL output present at time of RD assessment  Medications reviewed and include: fenofibrate 160 mg daily, pantoprazole, Miralax, senna-docusate, LR @ 75 mL/hr, Zosyn.  Labs reviewed: Chloride 97, BUN 35, Creatinine 1.65.  I/O: 3000 mL UOP yesterday (1.3 mL/kg/hr); 750 mL output from NGT yesterday  Patient does not meet criteria for malnutrition at this time. She has been 6 days without nutrition now, so she is at risk for development of acute malnutrition without initiation of nutrition support at this point.  Discussed with RN.  NUTRITION - FOCUSED PHYSICAL EXAM:    Most Recent Value  Orbital Region  No depletion  Upper Arm Region  No depletion  Thoracic and Lumbar Region  Unable to assess [abdomen distended]  Buccal Region  No depletion  Temple Region  No depletion  Clavicle Bone Region  No depletion  Clavicle and Acromion Bone Region  No depletion  Scapular Bone Region  No depletion  Dorsal Hand  No depletion  Patellar Region  No depletion  Anterior Thigh Region  No depletion  Posterior Calf Region  No depletion  Edema (RD Assessment)  None  Hair  Reviewed  Eyes  Reviewed  Mouth  Reviewed  Skin  Reviewed  Nails  Reviewed     Diet Order:   Diet Order            Diet NPO time specified  Diet effective now             EDUCATION NEEDS:   No education needs have been identified at this time  Skin:  Skin Assessment: Reviewed RN Assessment  Last BM:  PTA (03/17/2018 per chart)  Height:   Ht Readings from Last 1 Encounters:  03/19/18 5' 7"   (1.702 m)   Weight:   Wt Readings from Last 1 Encounters:  03/22/18 98.2 kg   Ideal Body Weight:  61.4 kg  BMI:  Body mass index is 33.91 kg/m.  Estimated Nutritional Needs:   Kcal:  2000-2330 (MSJ x 1.2-1.4)  Protein:  100-120 grams (1-1.2 grams/kg)  Fluid:  1.8-2.1 L/day (30-35 mL/kg IBW)  Willey Blade, MS, RD, LDN Office: [phone] Pager: [phone] After Hours/Weekend Pager: [phone]

## 2018-03-22 NOTE — Progress Notes (Signed)
[employer] - [place] [facility]                                                                                                                                                                                  Patient Demographics   Terri Berry, is a 43 y.o. female, DOB - [birth year], WUJ:[id number]  Admit date - 03/18/2018   Admitting Physician Milagros Loll, MD  Outpatient Primary MD for the patient is Thomes Dinning, MD   LOS - 4  Subjective: Patient states that her breathing is okay.  She did have some flatus yesterday afternoon.  No bowel movement.  She again is concerned that her mouth is very dry and she is worried she is going to develop mouth sores.  She also endorses headache.   Review of Systems:   CONSTITUTIONAL: No documented fever. No fatigue, weakness. No weight gain, no weight loss.  EYES: No blurry or double vision.  ENT: No tinnitus. No postnasal drip. No redness of the oropharynx.  RESPIRATORY: No cough, no wheeze, no hemoptysis. No dyspnea.  CARDIOVASCULAR: No chest pain. No orthopnea. No palpitations. No syncope.  GASTROINTESTINAL: No nausea, no vomiting or diarrhea. +abdominal pain. No melena or hematochezia.  GENITOURINARY: No dysuria or hematuria.  ENDOCRINE: No polyuria or nocturia. No heat or cold intolerance.  HEMATOLOGY: No anemia. No bruising. No bleeding.  INTEGUMENTARY: No rashes. No lesions.  MUSCULOSKELETAL: No arthritis. No swelling. No gout.  NEUROLOGIC: No numbness, tingling, or ataxia. No seizure-type activity. +headache PSYCHIATRIC: No anxiety. No insomnia. No ADD.   Vitals:   Vitals:   03/21/18 2016 03/22/18 0423 03/22/18 1112 03/22/18 1138  BP: 127/87 132/90  119/62  Pulse: 81 95  85  Resp: 18 (!) 24  18  Temp: 98.1 F (36.7 C) 98.6 F (37 C)  99.9 F (37.7 C)  TempSrc: Oral Oral  Oral  SpO2: 97% 97%  99%  Weight:  98.2 kg 96.9 kg   Height:        Wt Readings from Last 3 Encounters:  03/22/18 96.9 kg   03/05/18 95.5 kg  02/15/18 94.4 kg     Intake/Output Summary (Last 24 hours) at 03/22/2018 1415 Last data filed at 03/22/2018 1352 Gross per 24 hour  Intake -  Output 3805 ml  Net -3805 ml    Physical Exam:   GENERAL: Sitting up in bed, in no acute distress HEENT: Atraumatic, normocephalic. Extraocular muscles are intact. Pupils equal and reactive to light. Sclerae anicteric. No conjunctival injection. No oro-pharyngeal erythema.  +NG tube in place. NECK: Supple. There is no jugular venous distention. No bruits, no lymphadenopathy, no thyromegaly.  HEART: RRR, no murmurs clicks  or gallops LUNGS: Decreased breath sounds bilaterally without any accessory muscle usage.  Nasal cannula in place. ABDOMEN: + Distention, + tenderness to palpation throughout the entire abdomen, no guarding or rebound. +hypoactive bowel sounds EXTREMITIES: No evidence of any cyanosis, clubbing, or peripheral edema.  +2 pedal and radial pulses bilaterally.  NEUROLOGIC: The patient is alert, awake, and oriented x3 with no focal motor or sensory deficits appreciated bilaterally.  SKIN: Moist and warm with no rashes appreciated. +two ulcers present on left foot, one on the dorsal surface, and one on the plantar surface, no erythema or drainage. Psych: Not anxious, depressed  Antibiotics   Anti-infectives (From admission, onward)   Start     Dose/Rate Route Frequency Ordered Stop   03/19/18 0845  piperacillin-tazobactam (ZOSYN) IVPB 3.375 g     3.375 g 12.5 mL/hr over 240 Minutes Intravenous Every 8 hours 03/19/18 0835        Medications   Scheduled Meds: . atorvastatin  40 mg Oral Daily  . chlorhexidine  15 mL Mouth Rinse BID  . enoxaparin (LOVENOX) injection  40 mg Subcutaneous Q24H  . fenofibrate  160 mg Oral Daily  . FLUoxetine  40 mg Oral Daily  . fluticasone  2 spray Each Nare BID  . insulin aspart  0-9 Units Subcutaneous Q4H  . mouth rinse  15 mL Mouth Rinse q12n4p  . pantoprazole  40 mg Oral  Daily  . polyethylene glycol  17 g Oral Daily  . senna-docusate  2 tablet Oral BID  . sodium chloride flush  3 mL Intravenous Q12H   Continuous Infusions: . sodium chloride Stopped (03/19/18 2156)  . Marland KitchenTPN (CLINIMIX-E) Adult     And  . Fat emulsion    . lactated ringers 75 mL/hr at 03/22/18 0536  . piperacillin-tazobactam (ZOSYN)  IV 3.375 g (03/22/18 0536)   PRN Meds:.sodium chloride, acetaminophen **OR** acetaminophen, albuterol, clonazePAM, metoCLOPramide (REGLAN) injection, morphine injection, phenol, promethazine   Data Review:   Micro Results Recent Results (from the past 240 hour(s))  CULTURE, BLOOD (ROUTINE X 2) w Reflex to ID Panel     Status: None (Preliminary result)   Collection Time: 03/19/18  9:16 AM  Result Value Ref Range Status   Specimen Description BLOOD Left thumb  Final   Special Requests   Final    BOTTLES DRAWN AEROBIC AND ANAEROBIC Blood Culture results may not be optimal due to an inadequate volume of blood received in culture bottles   Culture   Final    NO GROWTH 3 DAYS Performed at [facility], [address], [place], [place] [address]    Report Status PENDING  Incomplete  CULTURE, BLOOD (ROUTINE X 2) w Reflex to ID Panel     Status: None (Preliminary result)   Collection Time: 03/19/18  9:16 AM  Result Value Ref Range Status   Specimen Description BLOOD BLOOD LEFT HAND  Final   Special Requests   Final    BOTTLES DRAWN AEROBIC AND ANAEROBIC Blood Culture adequate volume   Culture   Final    NO GROWTH 3 DAYS Performed at [facility], [address], [place], [place] [address]    Report Status PENDING  Incomplete  MRSA PCR Screening     Status: None   Collection Time: 03/19/18  4:50 PM  Result Value Ref Range Status   MRSA by PCR NEGATIVE NEGATIVE Final    Comment:        The GeneXpert MRSA Assay (FDA approved for  NASAL specimens only), is one component of a comprehensive MRSA colonization surveillance program. It is  not intended to diagnose MRSA infection nor to guide or monitor treatment for MRSA infections. Performed at [facility], [address], [place], [place] [address]     Radiology Reports Dg Abd 1 View  Result Date: 03/19/2018 CLINICAL DATA:  Distended abdomen. EXAM: ABDOMEN - 1 VIEW COMPARISON:  03/19/2018 FINDINGS: Mild gaseous distention of the colon. There is no small bowel dilation to suggest obstruction. Soft tissues are unremarkable.  No acute skeletal abnormality. IMPRESSION: 1. No evidence of bowel obstruction.  No acute finding. 2. Mild gaseous distention of the colon. Electronically Signed   By: Amie Portland M.D.   On: 03/19/2018 16:37   Dg Abd 1 View  Result Date: 03/19/2018 CLINICAL DATA:  History of influenza.  Abdominal infection. EXAM: ABDOMEN - 1 VIEW COMPARISON:  CT abdomen pelvis 03/18/2018 FINDINGS: Bibasilar heterogeneous opacities. Contrast material within the urinary bladder. Gaseous distended loops of small bowel within the central abdomen. Gas within the colon. No free intraperitoneal air. IMPRESSION: Findings suggestive of ileus. Electronically Signed   By: Annia Belt M.D.   On: 03/19/2018 09:10   Ct Chest Wo Contrast  Result Date: 03/19/2018 CLINICAL DATA:  43 y/o F; acute respiratory failure in the setting of acute pancreatitis. EXAM: CT CHEST WITHOUT CONTRAST TECHNIQUE: Multidetector CT imaging of the chest was performed following the standard protocol without IV contrast. COMPARISON:  03/19/2018 chest radiograph. 03/18/2018 CT abdomen and pelvis. FINDINGS: Cardiovascular: No significant vascular findings. Normal heart size. No pericardial effusion. Mediastinum/Nodes: No enlarged mediastinal or axillary lymph nodes. Thyroid gland, trachea, and esophagus demonstrate no significant findings. Lungs/Pleura: Low lung volumes. Small left pleural effusion. Bibasilar consolidations. No findings of pulmonary edema. No pneumothorax. Upper Abdomen: Partially  visualized edema within the upper abdomen. Musculoskeletal: No fracture is seen. IMPRESSION: 1. Progressed small left pleural effusion, low lung volumes, and bibasilar consolidations which probably represent atelectasis, less likely pneumonia. Findings atypical for ARDS. 2. Stable partially visualized edema within the upper abdomen. Electronically Signed   By: Mitzi Hansen M.D.   On: 03/19/2018 16:23   Ct Abdomen Pelvis W Contrast  Result Date: 03/18/2018 CLINICAL DATA:  Abdominal pain with vomiting. History of pancreatitis. EXAM: CT ABDOMEN AND PELVIS WITH CONTRAST TECHNIQUE: Multidetector CT imaging of the abdomen and pelvis was performed using the standard protocol following bolus administration of intravenous contrast. CONTRAST:  OMNIPAQUE IOHEXOL 300 MG/ML  SOLN COMPARISON:  Abdominopelvic CT 02/10/2018 and 02/09/2018. FINDINGS: Lower chest: Bibasilar atelectasis has mildly improved. There is no significant pleural or pericardial effusion. Hepatobiliary: The hepatic density is within normal limits and no focal lesion identified. No significant biliary dilatation post cholecystectomy. Pancreas: The pancreas remains diffusely enlarged, although appears homogeneous without evidence of necrosis or acute hemorrhage. There is increasing peripancreatic inflammation and ill-defined fluid. No pancreatic ductal dilatation. Spleen: Normal in size without focal abnormality. Adrenals/Urinary Tract: Both adrenal glands appear normal. There are tiny cysts within the upper poles of both kidneys. No evidence of renal mass, urinary tract calculus or hydronephrosis. The bladder appears normal. Stomach/Bowel: No evidence of bowel wall thickening, distention or surrounding inflammatory change. The appendix appears normal. Moderate stool in the proximal colon. Vascular/Lymphatic: There are no enlarged abdominal or pelvic lymph nodes. Stable 12 mm portacaval node on image 35/2, likely reactive. The superior  mesenteric and splenic veins are attenuated by the pancreatic inflammatory changes, although remain patent. No evidence of large vessel occlusion.  Reproductive: Hysterectomy.  No evidence of adnexal mass. Other: Volume of pelvic ascites has not significantly increased. As above, there is slightly greater inflammatory change and ill-defined fluid surrounding the pancreas and extending into both pericolic gutters. No focal fluid collections are identified. Musculoskeletal: No acute or significant osseous findings. Severe discogenic sclerosis at L4-5. IMPRESSION: 1. Slight progression of extensive peripancreatic inflammatory changes with ill-defined fluid. No evidence of pancreatic hemorrhage, necrosis or focal fluid collection. 2. Attenuation of the splenic and superior mesenteric veins without evidence of large vessel occlusion. 3. Improved bibasilar atelectasis. Electronically Signed   By: Carey Bullocks M.D.   On: 03/18/2018 12:59   Dg Chest Port 1 View  Result Date: 03/19/2018 CLINICAL DATA:  Shortness of breath EXAM: PORTABLE CHEST 1 VIEW COMPARISON:  02/10/2018 FINDINGS: Low volume chest with streaky densities at the bases correlating with atelectasis by abdominal CT yesterday. No edema, effusion, or pneumothorax. Normal heart size and mediastinal contours. IMPRESSION: Low volume chest with bilateral lower lobe atelectasis. Electronically Signed   By: Marnee Spring M.D.   On: 03/19/2018 09:10   Dg Abd Portable 1v  Result Date: 03/19/2018 CLINICAL DATA:  Post NG tube placement EXAM: PORTABLE ABDOMEN - 1 VIEW COMPARISON:  03/19/2018 FINDINGS: Interval placement of nasogastric tube which decompresses the stomach. Nasogastric tube tip overlies the level of the mid stomach. Persistent gaseous distension of bowel loops noted. IMPRESSION: NG tube tip is in the stomach. Electronically Signed   By: Norva Pavlov M.D.   On: 03/19/2018 17:53   [place] Ekg Site Rite  Result Date: 03/22/2018 If Site Rite image  not attached, placement could not be confirmed due to current cardiac rhythm.    CBC Recent Labs  Lab 03/18/18 1140 03/19/18 0330 03/20/18 0357 03/21/18 0614 03/22/18 0353  WBC 23.6* 28.2* 24.0* 20.8* 20.9*  HGB 13.6 14.3 11.5* 9.1* 9.3*  HCT 44.4 46.6* 37.2 29.4* 30.3*  PLT 466* 476* 334 295 316  MCV 84.7 85.2 83.0 85.0 83.0  MCH 26.0 26.1 25.7* 26.3 25.5*  MCHC 30.6 30.7 30.9 31.0 30.7  RDW 14.1 14.5 14.6 14.8 14.6    Chemistries  Recent Labs  Lab 03/18/18 1140 03/19/18 0330 03/20/18 0357 03/21/18 0614 03/22/18 0353  NA 135 138 136 134* 136  K 3.2* 4.6 4.5 3.9 3.6  CL 97* 103 102 99 97*  CO2 24 21* 22 26 29   GLUCOSE 187* 156* 173* 188* 168*  BUN 14 24* 51* 51* 35*  CREATININE 0.98 2.07* 3.23* 2.46* 1.65*  CALCIUM 9.2 8.6* 8.4* 8.3* 8.4*  MG  --   --   --  1.9  --   AST 86*  --  69* 34  --   ALT 36  --  57* 35  --   ALKPHOS 61  --  59 67  --   BILITOT 0.8  --  1.3* 0.9  --    ------------------------------------------------------------------------------------------------------------------ estimated creatinine clearance is 52 mL/min (A) (by C-G formula based on SCr of 1.65 mg/dL (H)). ------------------------------------------------------------------------------------------------------------------ No results for input(s): HGBA1C in the last 72 hours. ------------------------------------------------------------------------------------------------------------------ Recent Labs    03/19/18 1643  TRIG 322*   ------------------------------------------------------------------------------------------------------------------ No results for input(s): TSH, T4TOTAL, T3FREE, THYROIDAB in the last 72 hours.  Invalid input(s): FREET3 ------------------------------------------------------------------------------------------------------------------ No results for input(s): VITAMINB12, FOLATE, FERRITIN, TIBC, IRON, RETICCTPCT in the last 72 hours.  Coagulation profile No  results for input(s): INR, PROTIME in the last 168 hours.  No results for input(s): DDIMER in the last 72 hours.  Cardiac Enzymes Recent Labs  Lab 03/19/18 0916 03/19/18 1402 03/19/18 2040  TROPONINI 0.07* 0.06* 0.06*   ------------------------------------------------------------------------------------------------------------------ Invalid input(s): POCBNP    Assessment & Plan   Acute hypoxic respiratory failure- likely due to gastric distention from pancreatitis with atelectasis.  Oxygen requirement coming down.  Currently on 2 L O2. -CT chest 2/13 with small left pleural effusion, low lung volumes, and bibasilar consolidations probably atelectasis. -Wean oxygen as able -Will monitor patient's respiratory status closely, may need to transfer back to stepdown  Severe acute on chronic pancreatitis- abdominal pain is a little bit improved today.  Still having persistent leukocytosis. -GI following- plan to start TPN today, needs repeat CT abdomen with contrast -Plan to repeat CT abdomen with contrast once creatinine is ~1, per nephrology recommendations -Continue Zosyn  Acute renal failure- due to IV contrast, hypotension, poor p.o. intake, NSAIDs.  Creatinine continues to improve. -Continue IV fluids -Neurology following -Holding home Lasix  Fever- resolved. Last fever was 2/14 at 0400.  Leukocytosis unchanged from yesterday. -Seen by ID- felt fever was likely related to acute on chronic pancreatitis.  Recommended Zosyn for now.  Ileus- persistent.  No bowel movement, but has had flatus -N.p.o. and start TPN -Continue NG tube  Hypertension- BPs well-controlled -Monitor  Chronic pain syndrome -Continue IV morphine   DVT prophylaxis- lovenox     Code Status Orders  (From admission, onward)         Start     Ordered   03/18/18 1407  Full code  Continuous     03/18/18 1409        Code Status History    Date Active Date Inactive Code Status Order ID Comments  User Context   02/09/2018 0921 02/15/2018 1549 Full Code [id number]  Barbaraann RondoSridharan, Prasanna, MD Inpatient   11/28/2017 0352 12/04/2017 2009 Full Code [id number]  Barbaraann RondoSridharan, Prasanna, MD Inpatient      Consults  Nephrology, gastroenterology, ID  DVT Prophylaxis lovenox  Lab Results  Component Value Date   PLT 316 03/22/2018     Time Spent in minutes   45 minutes  Greater than 50% of time spent in care coordination and counseling patient regarding the condition and plan of care.   Jinny BlossomKaty D  M.D on 03/22/2018 at 2:15 PM  Between 7am to 6pm - Pager - [phone]  After 6pm go to [email or web address] - Social research officer, governmentpassword [email or web address]  [employer]   Office  [phone]

## 2018-03-22 NOTE — Progress Notes (Signed)
Peripherally Inserted Central Catheter/Midline Placement  The IV Nurse has discussed with the patient and/or persons authorized to consent for the patient, the purpose of this procedure and the potential benefits and risks involved with this procedure.  The benefits include less needle sticks, lab draws from the catheter, and the patient may be discharged home with the catheter. Risks include, but not limited to, infection, bleeding, blood clot (thrombus formation), and puncture of an artery; nerve damage and irregular heartbeat and possibility to perform a PICC exchange if needed/ordered by physician.  Alternatives to this procedure were also discussed.  Bard Power PICC patient education guide, fact sheet on infection prevention and patient information card has been provided to patient /or left at bedside.  Consent witnessed by Janeece Riggers.  Pt recently had pain and nausea meds.  Was A&O, asked pertinent questions and stated has had a PICC in the past.  PICC/Midline Placement Documentation  PICC Double Lumen 03/22/18 PICC Right Brachial 40 cm 1 cm (Active)  Indication for Insertion or Continuance of Line Administration of hyperosmolar/irritating solutions (i.e. TPN, Vancomycin, etc.) 03/22/2018  6:53 PM  Exposed Catheter (cm) 1 cm 03/22/2018  6:53 PM  Site Assessment Clean;Intact;Dry 03/22/2018  6:53 PM  Lumen #1 Status Flushed;Saline locked;Blood return noted 03/22/2018  6:53 PM  Lumen #2 Status Flushed;Saline locked;Blood return noted 03/22/2018  6:53 PM  Dressing Type Transparent 03/22/2018  6:53 PM  Dressing Status Clean;Dry;Intact;Antimicrobial disc in place 03/22/2018  6:53 PM  Line Care Connections checked and tightened 03/22/2018  6:53 PM  Line Adjustment (NICU/IV Team Only) No 03/22/2018  6:53 PM  Dressing Intervention New dressing 03/22/2018  6:53 PM  Dressing Change Due 03/29/18 03/22/2018  6:53 PM       Elliot Dally 03/22/2018, 6:54 PM

## 2018-03-23 ENCOUNTER — Inpatient Hospital Stay: Payer: Medicaid Other

## 2018-03-23 DIAGNOSIS — K858 Other acute pancreatitis without necrosis or infection: Secondary | ICD-10-CM

## 2018-03-23 LAB — COMPREHENSIVE METABOLIC PANEL
ALT: 22 U/L (ref 0–44)
AST: 22 U/L (ref 15–41)
Albumin: 2.4 g/dL — ABNORMAL LOW (ref 3.5–5.0)
Alkaline Phosphatase: 73 U/L (ref 38–126)
Anion gap: 9 (ref 5–15)
BUN: 26 mg/dL — ABNORMAL HIGH (ref 6–20)
CO2: 33 mmol/L — ABNORMAL HIGH (ref 22–32)
Calcium: 8.6 mg/dL — ABNORMAL LOW (ref 8.9–10.3)
Chloride: 93 mmol/L — ABNORMAL LOW (ref 98–111)
Creatinine, Ser: 1.23 mg/dL — ABNORMAL HIGH (ref 0.44–1.00)
GFR calc Af Amer: >60 mL/min (ref >60)
GFR calc non Af Amer: 53 mL/min — ABNORMAL LOW (ref >60)
Glucose, Bld: 274 mg/dL — ABNORMAL HIGH (ref 70–99)
POTASSIUM: 3.1 mmol/L — AB (ref 3.5–5.1)
Sodium: 135 mmol/L (ref 135–145)
Total Bilirubin: 0.6 mg/dL (ref 0.3–1.2)
Total Protein: 6.8 g/dL (ref 6.5–8.1)

## 2018-03-23 LAB — CBC
HCT: 27.7 % — ABNORMAL LOW (ref 36.0–46.0)
Hemoglobin: 8.7 g/dL — ABNORMAL LOW (ref 12.0–15.0)
MCH: 25.8 pg — ABNORMAL LOW (ref 26.0–34.0)
MCHC: 31.4 g/dL (ref 30.0–36.0)
MCV: 82.2 fL (ref 80.0–100.0)
Platelets: 320 10*3/uL (ref 150–400)
RBC: 3.37 MIL/uL — ABNORMAL LOW (ref 3.87–5.11)
RDW: 14.7 % (ref 11.5–15.5)
WBC: 22.2 10*3/uL — ABNORMAL HIGH (ref 4.0–10.5)
nRBC: 0.1 % (ref 0.0–0.2)

## 2018-03-23 LAB — PROTIME-INR
INR: 1.15
Prothrombin Time: 14.6 seconds (ref 11.4–15.2)

## 2018-03-23 LAB — DIFFERENTIAL
Abs Immature Granulocytes: 0.81 10*3/uL — ABNORMAL HIGH (ref 0.00–0.07)
BASOS ABS: 0.1 10*3/uL (ref 0.0–0.1)
BASOS PCT: 1 %
EOS ABS: 0.2 10*3/uL (ref 0.0–0.5)
Eosinophils Relative: 1 %
Immature Granulocytes: 4 %
Lymphocytes Relative: 6 %
Lymphs Abs: 1.3 10*3/uL (ref 0.7–4.0)
Monocytes Absolute: 1.9 10*3/uL — ABNORMAL HIGH (ref 0.1–1.0)
Monocytes Relative: 9 %
NEUTROS PCT: 79 %
Neutro Abs: 17.9 10*3/uL — ABNORMAL HIGH (ref 1.7–7.7)

## 2018-03-23 LAB — GLUCOSE, CAPILLARY
Glucose-Capillary: 223 mg/dL — ABNORMAL HIGH (ref 70–99)
Glucose-Capillary: 245 mg/dL — ABNORMAL HIGH (ref 70–99)
Glucose-Capillary: 246 mg/dL — ABNORMAL HIGH (ref 70–99)
Glucose-Capillary: 270 mg/dL — ABNORMAL HIGH (ref 70–99)
Glucose-Capillary: 293 mg/dL — ABNORMAL HIGH (ref 70–99)
Glucose-Capillary: 331 mg/dL — ABNORMAL HIGH (ref 70–99)

## 2018-03-23 LAB — PHOSPHORUS: PHOSPHORUS: 1.9 mg/dL — AB (ref 2.5–4.6)

## 2018-03-23 LAB — TRIGLYCERIDES: Triglycerides: 727 mg/dL — ABNORMAL HIGH (ref <150)

## 2018-03-23 LAB — APTT: aPTT: 28 seconds (ref 24–36)

## 2018-03-23 LAB — MAGNESIUM: Magnesium: 1.9 mg/dL (ref 1.7–2.4)

## 2018-03-23 MED ORDER — HEPARIN BOLUS VIA INFUSION
5000.0000 [IU] | Freq: Once | INTRAVENOUS | Status: AC
  Administered 2018-03-23: 5000 [IU] via INTRAVENOUS
  Filled 2018-03-23: qty 5000

## 2018-03-23 MED ORDER — POTASSIUM CHLORIDE 10 MEQ/100ML IV SOLN
10.0000 meq | INTRAVENOUS | Status: AC
  Administered 2018-03-23 (×3): 10 meq via INTRAVENOUS
  Filled 2018-03-23 (×3): qty 100

## 2018-03-23 MED ORDER — IOHEXOL 300 MG/ML  SOLN
100.0000 mL | Freq: Once | INTRAMUSCULAR | Status: AC | PRN
  Administered 2018-03-23: 100 mL via INTRAVENOUS

## 2018-03-23 MED ORDER — TRACE MINERALS CR-CU-MN-SE-ZN 10-1000-500-60 MCG/ML IV SOLN
INTRAVENOUS | Status: AC
  Administered 2018-03-23: 18:00:00 via INTRAVENOUS
  Filled 2018-03-23: qty 1992

## 2018-03-23 MED ORDER — HEPARIN (PORCINE) 25000 UT/250ML-% IV SOLN
2700.0000 [IU]/h | INTRAVENOUS | Status: DC
  Administered 2018-03-23: 1400 [IU]/h via INTRAVENOUS
  Administered 2018-03-25: 2300 [IU]/h via INTRAVENOUS
  Administered 2018-03-25: 2500 [IU]/h via INTRAVENOUS
  Administered 2018-03-26: 2700 [IU]/h via INTRAVENOUS
  Administered 2018-03-26: 2500 [IU]/h via INTRAVENOUS
  Administered 2018-03-27: 2700 [IU]/h via INTRAVENOUS
  Filled 2018-03-23 (×9): qty 250

## 2018-03-23 MED ORDER — SODIUM CHLORIDE 0.9 % IV SOLN
INTRAVENOUS | Status: DC
  Administered 2018-03-23 – 2018-03-27 (×7): via INTRAVENOUS

## 2018-03-23 MED ORDER — POTASSIUM PHOSPHATES 15 MMOLE/5ML IV SOLN
15.0000 mmol | Freq: Once | INTRAVENOUS | Status: AC
  Administered 2018-03-23: 15 mmol via INTRAVENOUS
  Filled 2018-03-23: qty 5

## 2018-03-23 NOTE — Progress Notes (Signed)
[facility]  ROUNDING NOTE   Subjective:   Patient continues to report abdominal pain.  Remains NPO and started on TPN.  NG tube is out but patient is refusing replacement.  She remains nauseous also. Urine output 3000 cc Serum creatinine further improved to 1.23 Potassium is being replaced IV  Objective:  Vital signs in last 24 hours:  Temp:  [97.5 F (36.4 C)-99.4 F (37.4 C)] 98.6 F (37 C) (02/17 1300) Pulse Rate:  [82-94] 82 (02/17 1415) Resp:  [16-20] 16 (02/17 1300) BP: (137-152)/(79-90) 152/90 (02/17 1300) SpO2:  [90 %-97 %] 92 % (02/17 1415)  Weight change: -1.3 kg Filed Weights   03/20/18 0238 03/22/18 0423 03/22/18 1112  Weight: 98.7 kg 98.2 kg 96.9 kg    Intake/Output: I/O last 3 completed shifts: In: 3599 [I.V.:3349; IV Piggyback:250] Out: 4855 [Urine:4500; Emesis/NG output:355]   Intake/Output this shift:  Total I/O In: 546.3 [I.V.:512; IV Piggyback:34.3] Out: 1050 [Urine:1050]  Physical Exam: General: NAD,   Head: NGT  Eyes: Anicteric, PERRL  Neck: Supple, trachea midline  Lungs:  Clear to auscultation  Heart: Regular rate and rhythm  Abdomen:  +tender, obese, distended  Extremities:  no peripheral edema.  Neurologic: Nonfocal, moving all four extremities  Skin: No lesions  GU: +foley    Basic Metabolic Panel: Recent Labs  Lab 03/19/18 0330 03/20/18 0357 03/21/18 0614 03/22/18 0353 03/23/18 0600  NA 138 136 134* 136 135  K 4.6 4.5 3.9 3.6 3.1*  CL 103 102 99 97* 93*  CO2 21* 22 26 29  33*  GLUCOSE 156* 173* 188* 168* 274*  BUN 24* 51* 51* 35* 26*  CREATININE 2.07* 3.23* 2.46* 1.65* 1.23*  CALCIUM 8.6* 8.4* 8.3* 8.4* 8.6*  MG  --   --  1.9  --  1.9  PHOS  --   --  3.3  --  1.9*    Liver Function Tests: Recent Labs  Lab 03/18/18 1140 03/20/18 0357 03/21/18 0614 03/23/18 0600  AST 86* 69* 34 22  ALT 36 57* 35 22  ALKPHOS 61 59 67 73  BILITOT 0.8 1.3* 0.9 0.6  PROT 8.3* 6.5 6.5 6.8  ALBUMIN 4.2 2.6* 2.4* 2.4*    Recent Labs  Lab 03/18/18 1140 03/19/18 0330 03/20/18 0357 03/21/18 0614  LIPASE 1,044* 888* 341* 76*   No results for input(s): AMMONIA in the last 168 hours.  CBC: Recent Labs  Lab 03/19/18 0330 03/20/18 0357 03/21/18 0614 03/22/18 0353 03/23/18 0600  WBC 28.2* 24.0* 20.8* 20.9* 22.2*  NEUTROABS  --   --   --   --  17.9*  HGB 14.3 11.5* 9.1* 9.3* 8.7*  HCT 46.6* 37.2 29.4* 30.3* 27.7*  MCV 85.2 83.0 85.0 83.0 82.2  PLT 476* 334 295 316 320    Cardiac Enzymes: Recent Labs  Lab 03/19/18 0916 03/19/18 1402 03/19/18 2040  TROPONINI 0.07* 0.06* 0.06*    BNP: Invalid input(s): POCBNP  CBG: Recent Labs  Lab 03/22/18 2011 03/23/18 0015 03/23/18 0347 03/23/18 0732 03/23/18 1141  GLUCAP 153* 223* 245* 246* 270*    Microbiology: Results for orders placed or performed during the hospital encounter of 03/18/18  CULTURE, BLOOD (ROUTINE X 2) w Reflex to ID Panel     Status: None (Preliminary result)   Collection Time: 03/19/18  9:16 AM  Result Value Ref Range Status   Specimen Description BLOOD Left thumb  Final   Special Requests   Final    BOTTLES DRAWN AEROBIC AND ANAEROBIC Blood Culture  results may not be optimal due to an inadequate volume of blood received in culture bottles   Culture   Final    NO GROWTH 4 DAYS Performed at [facility], [address] [place], [place] [address]    Report Status PENDING  Incomplete  CULTURE, BLOOD (ROUTINE X 2) w Reflex to ID Panel     Status: None (Preliminary result)   Collection Time: 03/19/18  9:16 AM  Result Value Ref Range Status   Specimen Description BLOOD BLOOD LEFT HAND  Final   Special Requests   Final    BOTTLES DRAWN AEROBIC AND ANAEROBIC Blood Culture adequate volume   Culture   Final    NO GROWTH 4 DAYS Performed at [facility], [address] [place], [place] [address]    Report Status PENDING  Incomplete  MRSA PCR Screening     Status: None   Collection Time: 03/19/18   4:50 PM  Result Value Ref Range Status   MRSA by PCR NEGATIVE NEGATIVE Final    Comment:        The GeneXpert MRSA Assay (FDA approved for NASAL specimens only), is one component of a comprehensive MRSA colonization surveillance program. It is not intended to diagnose MRSA infection nor to guide or monitor treatment for MRSA infections. Performed at [facility], [address] [place], [place] [address]     Coagulation Studies: No results for input(s): LABPROT, INR in the last 72 hours.  Urinalysis: No results for input(s): COLORURINE, LABSPEC, PHURINE, GLUCOSEU, HGBUR, BILIRUBINUR, KETONESUR, PROTEINUR, UROBILINOGEN, NITRITE, LEUKOCYTESUR in the last 72 hours.  Invalid input(s): APPERANCEUR    Imaging: Dg Abd 1 View  Result Date: 03/23/2018 CLINICAL DATA:  Nasogastric tube placement. EXAM: ABDOMEN - 1 VIEW COMPARISON:  Radiographs of March 19, 2018. FINDINGS: Distal tip of nasogastric tube is seen in expected position of proximal stomach. Status post cholecystectomy. No abnormal bowel dilatation is noted. IMPRESSION: Distal tip of nasogastric tube seen in expected position of proximal stomach. Electronically Signed   By: Lupita RaiderJames  Green Jr, M.D.   On: 03/23/2018 15:00   [place] Ekg Site Rite  Result Date: 03/22/2018 If Site Rite image not attached, placement could not be confirmed due to current cardiac rhythm.    Medications:   . Marland Kitchen.TPN (CLINIMIX-E) Adult    . sodium chloride Stopped (03/23/18 0627)  . sodium chloride 125 mL/hr at 03/23/18 1221  . piperacillin-tazobactam (ZOSYN)  IV 12.5 mL/hr at 03/23/18 0912  . potassium PHOSPHATE IVPB (in mmol) 15 mmol (03/23/18 1222)  . Marland Kitchen.TPN (CLINIMIX-E) Adult 40 mL/hr at 03/23/18 0912   . atorvastatin  40 mg Oral Daily  . chlorhexidine  15 mL Mouth Rinse BID  . enoxaparin (LOVENOX) injection  40 mg Subcutaneous Q24H  . fenofibrate  160 mg Oral Daily  . FLUoxetine  40 mg Oral Daily  . fluticasone  2 spray Each Nare BID   . insulin aspart  0-9 Units Subcutaneous Q4H  . mouth rinse  15 mL Mouth Rinse q12n4p  . pantoprazole  40 mg Oral Daily  . polyethylene glycol  17 g Oral Daily  . senna-docusate  2 tablet Oral BID  . sodium chloride flush  10-40 mL Intracatheter Q12H  . sodium chloride flush  3 mL Intravenous Q12H   sodium chloride, acetaminophen **OR** acetaminophen, albuterol, clonazePAM, metoCLOPramide (REGLAN) injection, morphine injection, phenol, promethazine, sodium chloride flush  Assessment/ Plan:  [name] is a 43 y.o. white nonhispanic female with morbid  obesity, hypertension, depression, anxiety, GERD who was admitted to [facility] on 03/18/2018 for acute pancreatitis.   1. Acute renal failure: with IV contrast exposure on 2/12, hypotension, poor PO intake, use of NSAIDs. Ketorolac given in ED as well.  Creatinine peaked at 3.23.  Creatinine on admission of 0.98, acute rise. Nonoliguric.  Foley catheter in place.  Urine output greater than 3000 cc.  Serum creatinine improving satisfactorily. Most likely ATN from multifactorial etiologies.   2.  Hypokalemia. -Monitor magnesium and potassium.  Replace as necessary.  Continue supportive care We will sign off.  Please reconsult as necessary   LOS: 5 Estell Puccini 2/17/20204:10 PM

## 2018-03-23 NOTE — Progress Notes (Signed)
Inpatient Diabetes Program Recommendations  AACE/ADA: New Consensus Statement on Inpatient Glycemic Control   Target Ranges:  Prepandial:   less than 140 mg/dL      Peak postprandial:   less than 180 mg/dL (1-2 hours)      Critically ill patients:  140 - 180 mg/dL   Results for Terri Berry, Terri Berry (MRN [id number]) as of 03/23/2018 10:40  Ref. Range 03/19/2018 16:39 03/22/2018 16:22 03/22/2018 20:11 03/23/2018 00:15 03/23/2018 03:47 03/23/2018 07:32  Glucose-Capillary Latest Ref Range: 70 - 99 mg/dL 643 (H) 329 (H) 518 (H) 223 (H) 245 (H) 246 (H)   Review of Glycemic Control  Current orders for Inpatient glycemic control: Novolog 0-9 units Q4H; TPN @ 40 ml/hr  Inpatient Diabetes Program Recommendations:   Insulin addition to TPN: Noted glucose has been more elevated since starting TPN. Pharmacy may want to consider adding insulin to TPN to help improve glycemic control.  Thanks, Orlando Penner, RN, MSN, CDE Diabetes Coordinator Inpatient Diabetes Program [phone] (Team Pager from 8am to 5pm)

## 2018-03-23 NOTE — Progress Notes (Signed)
Terri Bouillon, MD [address], [place], [place], [address] [address], [address], [place], [place], [address] Phone: [phone]  Fax: [phone]   Subjective:  Patient resting in bed comfortably.  No nausea or vomiting.  No on NG tube suction as patient removed that overnight.  Continues to have abdominal distention and pain.  But states pain has not worsened.  No fever or chills.  Objective: Exam: Vital signs in last 24 hours: Vitals:   03/23/18 0359 03/23/18 1300 03/23/18 1411 03/23/18 1415  BP: (!) 143/90 (!) 152/90    Pulse: 94 92 88 82  Resp: 20 16    Temp: 98.4 F (36.9 C) 98.6 F (37 C)    TempSrc: Oral Oral    SpO2: 97% 90% 91% 92%  Weight:      Height:       Weight change: -1.3 kg  Intake/Output Summary (Last 24 hours) at 03/23/2018 1819 Last data filed at 03/23/2018 1505 Gross per 24 hour  Intake 4145.28 ml  Output 3000 ml  Net 1145.28 ml    General: No acute distress, AAO x3 Abd: Soft, NT/ND, No HSM Skin: Warm, no rashes Neck: Supple, Trachea midline   Lab Results: Lab Results  Component Value Date   WBC 22.2 (H) 03/23/2018   HGB 8.7 (L) 03/23/2018   HCT 27.7 (L) 03/23/2018   MCV 82.2 03/23/2018   PLT 320 03/23/2018   Micro Results: Recent Results (from the past 240 hour(s))  CULTURE, BLOOD (ROUTINE X 2) w Reflex to ID Panel     Status: None (Preliminary result)   Collection Time: 03/19/18  9:16 AM  Result Value Ref Range Status   Specimen Description BLOOD Left thumb  Final   Special Requests   Final    BOTTLES DRAWN AEROBIC AND ANAEROBIC Blood Culture results may not be optimal due to an inadequate volume of blood received in culture bottles   Culture   Final    NO GROWTH 4 DAYS Performed at [facility], [address] [place], [place] [address]    Report Status PENDING  Incomplete  CULTURE, BLOOD (ROUTINE X 2) w Reflex to ID Panel     Status: None (Preliminary result)   Collection Time: 03/19/18  9:16  AM  Result Value Ref Range Status   Specimen Description BLOOD BLOOD LEFT HAND  Final   Special Requests   Final    BOTTLES DRAWN AEROBIC AND ANAEROBIC Blood Culture adequate volume   Culture   Final    NO GROWTH 4 DAYS Performed at [facility], [address] [place], [place] [address]    Report Status PENDING  Incomplete  MRSA PCR Screening     Status: None   Collection Time: 03/19/18  4:50 PM  Result Value Ref Range Status   MRSA by PCR NEGATIVE NEGATIVE Final    Comment:        The GeneXpert MRSA Assay (FDA approved for NASAL specimens only), is one component of a comprehensive MRSA colonization surveillance program. It is not intended to diagnose MRSA infection nor to guide or monitor treatment for MRSA infections. Performed at [facility], [address] [place], [place] [address]    Studies/Results: Dg Abd 1 View  Result Date: 03/23/2018 CLINICAL DATA:  Nasogastric tube placement. EXAM: ABDOMEN - 1 VIEW COMPARISON:  Radiographs of March 19, 2018. FINDINGS: Distal tip of nasogastric tube is seen in expected position of proximal stomach. Status post cholecystectomy. No abnormal  bowel dilatation is noted. IMPRESSION: Distal tip of nasogastric tube seen in expected position of proximal stomach. Electronically Signed   By: Lupita RaiderJames  Green Jr, M.D.   On: 03/23/2018 15:00   Ct Abdomen W Contrast  Result Date: 03/23/2018 CLINICAL DATA:  Follow-up pancreatitis EXAM: CT ABDOMEN WITH CONTRAST TECHNIQUE: Multidetector CT imaging of the abdomen was performed using the standard protocol following bolus administration of intravenous contrast. CONTRAST:  100mL OMNIPAQUE IOHEXOL 300 MG/ML  SOLN COMPARISON:  03/18/2018 FINDINGS: Lower chest: Bilateral pleural effusions and posterior lower lobe atelectasis is again noted. Compared with previous exam the aeration to both lung bases has worsened. Hepatobiliary: No focal liver abnormality identified at this time. Previous  cholecystectomy. No biliary ductal dilatation. Pancreas: There is marked diffuse progressive edema involving the pancreas especially the uncinate process of the head and tail of pancreas, image 39/2 and image 48/2. Increased heterogeneity within the uncinate process and tail of pancreas is noted without frank necrosis. No main duct dilatation identified. Surrounding fat stranding and free fluid is again identified. Spleen: The spleen is prominent measuring 12.4 cm in cranial caudal dimension. No focal splenic lesion noted. Adrenals/Urinary Tract: Normal appearance of the adrenal glands. Small hypodensities within the upper pole of both kidneys is unchanged, too small to reliably characterize. No enhancing mass or hydronephrosis identified bilaterally. Stomach/Bowel: Nasogastric tube is in the body of stomach. The stomach is nondistended. There is no abnormal dilatation of the small or large bowel loops. Vascular/Lymphatic: Normal appearance of the abdominal aorta. No aneurysm. There is decrease mass effect and narrowing involving the superior mesenteric artery and portal venous confluence. Eccentric filling defect within the superior mesenteric vein just before the portal venous confluence is identified compatible with thrombus, image 53/5. This appears nonocclusive. No additional suspicious filling defects identified. The splenic vein remains patent. Other: There is fluid and edema identified within the root of the mesentery, stable to increased from previous exam. Fluid within the left upper quadrant of the abdomen extending along the splenic hilum and around the tail of pancreas is identified. There is new early encapsulation of this fluid collection with enhancing rim compatible with developing pseudocyst., image 75/5. Musculoskeletal: There are degenerative changes identified throughout the thoracic and lumbar spine. IMPRESSION: 1. Changes of acute pancreatitis are again noted. There is progressive  edema/enlargement with heterogeneous enhancement of the uncinate process of pancreas and tail of pancreas. No frank necrosis identified. 2. Although there is decreased mass effect and narrowing of the portal venous confluence and distal superior mesenteric vein there is a new eccentric filling defect within the superior mesenteric vein compatible with nonocclusive thrombus. 3. Left upper quadrant fluid with early capsular enhancement suggest developing pseudocyst. 4. Persistent bilateral pleural effusions with worsening aeration to both lung bases. Electronically Signed   By: Signa Kellaylor  Stroud M.D.   On: 03/23/2018 16:40   [place] Ekg Site Rite  Result Date: 03/22/2018 If Site Rite image not attached, placement could not be confirmed due to current cardiac rhythm.  Medications:  Scheduled Meds: . atorvastatin  40 mg Oral Daily  . chlorhexidine  15 mL Mouth Rinse BID  . fenofibrate  160 mg Oral Daily  . FLUoxetine  40 mg Oral Daily  . fluticasone  2 spray Each Nare BID  . heparin  5,000 Units Intravenous Once  . insulin aspart  0-9 Units Subcutaneous Q4H  . mouth rinse  15 mL Mouth Rinse q12n4p  . pantoprazole  40 mg Oral Daily  . polyethylene glycol  17 g Oral Daily  . senna-docusate  2 tablet Oral BID  . sodium chloride flush  10-40 mL Intracatheter Q12H  . sodium chloride flush  3 mL Intravenous Q12H   Continuous Infusions: . Marland KitchenTPN (CLINIMIX-E) Adult 83 mL/hr at 03/23/18 1804  . sodium chloride Stopped (03/23/18 0627)  . sodium chloride 125 mL/hr at 03/23/18 1221  . heparin    . piperacillin-tazobactam (ZOSYN)  IV 3.375 g (03/23/18 1638)  . potassium PHOSPHATE IVPB (in mmol) 15 mmol (03/23/18 1222)   PRN Meds:.sodium chloride, acetaminophen **OR** acetaminophen, albuterol, clonazePAM, metoCLOPramide (REGLAN) injection, morphine injection, phenol, promethazine, sodium chloride flush   Assessment: Active Problems:   Acute pancreatitis   Distended abdomen    Plan: Patient CT scan  yesterday shows changes of acute pancreatitis with progressive edema.  No frank necrosis is identified. 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  Her triglycerides have been thought to be the cause of her pancreatitis and are currently in the 700s.  Would recommend close monitoring of triglycerides and low threshold for ICU transfer for insulin drip with goal of triglycerides to be less than 500 prior to stopping the drip.  Would also recommend vascular surgery consult due to new SMV thrombus reported on CT scan  As recommended in Dr. Annabell Sabal note, MRCP when patient is more stable, would also allow to rule out pancreatic divisum or CBD obstruction as cause of her current pancreatitis.  Dr. Norma Fredrickson spoke to Dr. Nancy Marus about transfer of the patient to [facility] and as per his note, no beds were available at [facility].  Would recommend close monitoring with frequent abdominal exams and low threshold for ICU transfer for this patient.   LOS: 5 days   Terri Bouillon, MD 03/23/2018, 6:19 PM

## 2018-03-23 NOTE — Progress Notes (Signed)
Nutrition Brief Follow-Up Note  Nutrition Intervention: In setting of hypertriglyceridemia will discontinue daily lipids and change to just providing minimum amount of lipids needed weekly to prevent essential fatty acid deficiency.  Starting tonight initiate new goal TPN regimen of Clinimix E 5/20 at 83 mL/hr over 24 hours. Provides 1753 kcal, 100 grams of protein, 1992 mL fluid daily.  Decrease 20% ILE (lipids) to 250 mL over 12 hours on Mondays and Thursdays ONLY.  Continue adult MVI and trace elements as daily TPN additives.  Continue monitoring electrolytes and replacing as needed.  Assessment: Patient s/p PICC placement last night and TPN was initiated at 1/2 rate. Adjustments will need to be made to TPN prescription based on labs from today.  Labs reviewed: CBGs 223-270 since starting TPN, Potassium 3.1, Chloride 93, CO2 33, BUN 26, Creatinine 1.23, Phosphorus 1.9, Triglycerides 727 (increase from 322 on 2/13). Of note, HgbA1c was 6.1 on 03/10/2018.  Medications reviewed and include: Novolog 0-9 units Q4hrs (received 9 units since TPN started), Zosyn, potassium chloride 10 mEq x 4 runs, KPhos 15 mmol IV once today.  IV Access: right brachial double lumen PICC placed 2/16; ECG technology used to confirm PICC in SVC  Discussed with pharmacy.  Helane Rima, MS, RD, LDN Office: [phone] Pager: [phone] After Hours/Weekend Pager: [phone]

## 2018-03-23 NOTE — Progress Notes (Signed)
[employer] - [place] at [facility]                                                                                                                                                                                  Patient Demographics   Terri Berry, is a 43 y.o. female, DOB - October 30, 1975, WUJ:[id number]  Admit date - 03/18/2018   Admitting Physician Milagros Loll, MD  Outpatient Primary MD for the patient is Thomes Dinning, MD   LOS - 5  Subjective: Patient states she is not feeling great this morning.  Her NG tube spontaneously came out early this morning.  She refused to have the NG tube replaced.  She is asking to have something to drink.  She states that her abdominal pain is not any worse than yesterday.   Review of Systems:   CONSTITUTIONAL: No documented fever. No fatigue, weakness. No weight gain, no weight loss.  EYES: No blurry or double vision.  ENT: No tinnitus. No postnasal drip. No redness of the oropharynx.  RESPIRATORY: No cough, no wheeze, no hemoptysis. No dyspnea.  CARDIOVASCULAR: No chest pain. No orthopnea. No palpitations. No syncope.  GASTROINTESTINAL: No nausea, no vomiting or diarrhea. +abdominal pain. No melena or hematochezia.  GENITOURINARY: No dysuria or hematuria.  ENDOCRINE: No polyuria or nocturia. No heat or cold intolerance.  HEMATOLOGY: No anemia. No bruising. No bleeding.  INTEGUMENTARY: No rashes. No lesions.  MUSCULOSKELETAL: No arthritis. No swelling. No gout.  NEUROLOGIC: No numbness, tingling, or ataxia. No seizure-type activity. +headache PSYCHIATRIC: No anxiety. No insomnia. No ADD.   Vitals:   Vitals:   03/22/18 1138 03/22/18 2007 03/23/18 0316 03/23/18 0359  BP: 119/62 (!) 143/85 137/79 (!) 143/90  Pulse: 85 88 91 94  Resp: 18 18 20 20   Temp: 99.9 F (37.7 C) (!) 97.5 F (36.4 C) 99.4 F (37.4 C) 98.4 F (36.9 C)  TempSrc: Oral Oral Axillary Oral  SpO2: 99% 94% 95% 97%  Weight:      Height:        Wt  Readings from Last 3 Encounters:  03/22/18 96.9 kg  03/05/18 95.5 kg  02/15/18 94.4 kg     Intake/Output Summary (Last 24 hours) at 03/23/2018 1145 Last data filed at 03/23/2018 9562 Gross per 24 hour  Intake 4145.28 ml  Output 2905 ml  Net 1240.28 ml    Physical Exam:   GENERAL: Sitting up in bed, in no acute distress HEENT: Atraumatic, normocephalic. Extraocular muscles are intact. Pupils equal and reactive to light. Sclerae anicteric. No conjunctival injection. No oro-pharyngeal erythema.  NECK: Supple. There is no jugular venous distention. No bruits, no lymphadenopathy, no thyromegaly.  HEART:  RRR, no murmurs clicks or gallops LUNGS: Decreased breath sounds bilaterally without any accessory muscle usage.  Nasal cannula in place. ABDOMEN: + Distention, + tenderness to palpation throughout the entire abdomen, no guarding or rebound. +hypoactive bowel sounds EXTREMITIES: No evidence of any cyanosis, clubbing, or peripheral edema.  +2 pedal and radial pulses bilaterally.  NEUROLOGIC: The patient is alert, awake, and oriented x3 with no focal motor or sensory deficits appreciated bilaterally.  SKIN: Moist and warm with no rashes appreciated. +two ulcers present on left foot, one on the dorsal surface, and one on the plantar surface, no erythema or drainage. Psych: Not anxious, depressed  Antibiotics   Anti-infectives (From admission, onward)   Start     Dose/Rate Route Frequency Ordered Stop   03/19/18 0845  piperacillin-tazobactam (ZOSYN) IVPB 3.375 g     3.375 g 12.5 mL/hr over 240 Minutes Intravenous Every 8 hours 03/19/18 0835        Medications   Scheduled Meds: . atorvastatin  40 mg Oral Daily  . chlorhexidine  15 mL Mouth Rinse BID  . enoxaparin (LOVENOX) injection  40 mg Subcutaneous Q24H  . fenofibrate  160 mg Oral Daily  . FLUoxetine  40 mg Oral Daily  . fluticasone  2 spray Each Nare BID  . insulin aspart  0-9 Units Subcutaneous Q4H  . mouth rinse  15 mL Mouth  Rinse q12n4p  . pantoprazole  40 mg Oral Daily  . polyethylene glycol  17 g Oral Daily  . senna-docusate  2 tablet Oral BID  . sodium chloride flush  10-40 mL Intracatheter Q12H  . sodium chloride flush  3 mL Intravenous Q12H   Continuous Infusions: . sodium chloride Stopped (03/23/18 0627)  . lactated ringers 75 mL/hr at 03/23/18 0912  . piperacillin-tazobactam (ZOSYN)  IV 12.5 mL/hr at 03/23/18 0912  . potassium chloride 10 mEq (03/23/18 1131)  . potassium PHOSPHATE IVPB (in mmol)    . Marland KitchenTPN (CLINIMIX-E) Adult 40 mL/hr at 03/23/18 0912   PRN Meds:.sodium chloride, acetaminophen **OR** acetaminophen, albuterol, clonazePAM, metoCLOPramide (REGLAN) injection, morphine injection, phenol, promethazine, sodium chloride flush   Data Review:   Micro Results Recent Results (from the past 240 hour(s))  CULTURE, BLOOD (ROUTINE X 2) w Reflex to ID Panel     Status: None (Preliminary result)   Collection Time: 03/19/18  9:16 AM  Result Value Ref Range Status   Specimen Description BLOOD Left thumb  Final   Special Requests   Final    BOTTLES DRAWN AEROBIC AND ANAEROBIC Blood Culture results may not be optimal due to an inadequate volume of blood received in culture bottles   Culture   Final    NO GROWTH 4 DAYS Performed at [facility], [address] [place], [place] [address]    Report Status PENDING  Incomplete  CULTURE, BLOOD (ROUTINE X 2) w Reflex to ID Panel     Status: None (Preliminary result)   Collection Time: 03/19/18  9:16 AM  Result Value Ref Range Status   Specimen Description BLOOD BLOOD LEFT HAND  Final   Special Requests   Final    BOTTLES DRAWN AEROBIC AND ANAEROBIC Blood Culture adequate volume   Culture   Final    NO GROWTH 4 DAYS Performed at [facility], [address] [place], [place] [address]    Report Status PENDING  Incomplete  MRSA PCR Screening     Status: None   Collection Time: 03/19/18  4:50 PM  Result Value  Ref Range Status    MRSA by PCR NEGATIVE NEGATIVE Final    Comment:        The GeneXpert MRSA Assay (FDA approved for NASAL specimens only), is one component of a comprehensive MRSA colonization surveillance program. It is not intended to diagnose MRSA infection nor to guide or monitor treatment for MRSA infections. Performed at [facility], [address] [place], [place] [address]     Radiology Reports Dg Abd 1 View  Result Date: 03/19/2018 CLINICAL DATA:  Distended abdomen. EXAM: ABDOMEN - 1 VIEW COMPARISON:  03/19/2018 FINDINGS: Mild gaseous distention of the colon. There is no small bowel dilation to suggest obstruction. Soft tissues are unremarkable.  No acute skeletal abnormality. IMPRESSION: 1. No evidence of bowel obstruction.  No acute finding. 2. Mild gaseous distention of the colon. Electronically Signed   By: Amie Portland M.D.   On: 03/19/2018 16:37   Dg Abd 1 View  Result Date: 03/19/2018 CLINICAL DATA:  History of influenza.  Abdominal infection. EXAM: ABDOMEN - 1 VIEW COMPARISON:  CT abdomen pelvis 03/18/2018 FINDINGS: Bibasilar heterogeneous opacities. Contrast material within the urinary bladder. Gaseous distended loops of small bowel within the central abdomen. Gas within the colon. No free intraperitoneal air. IMPRESSION: Findings suggestive of ileus. Electronically Signed   By: Annia Belt M.D.   On: 03/19/2018 09:10   Ct Chest Wo Contrast  Result Date: 03/19/2018 CLINICAL DATA:  43 y/o F; acute respiratory failure in the setting of acute pancreatitis. EXAM: CT CHEST WITHOUT CONTRAST TECHNIQUE: Multidetector CT imaging of the chest was performed following the standard protocol without IV contrast. COMPARISON:  03/19/2018 chest radiograph. 03/18/2018 CT abdomen and pelvis. FINDINGS: Cardiovascular: No significant vascular findings. Normal heart size. No pericardial effusion. Mediastinum/Nodes: No enlarged mediastinal or axillary lymph nodes. Thyroid gland, trachea, and  esophagus demonstrate no significant findings. Lungs/Pleura: Low lung volumes. Small left pleural effusion. Bibasilar consolidations. No findings of pulmonary edema. No pneumothorax. Upper Abdomen: Partially visualized edema within the upper abdomen. Musculoskeletal: No fracture is seen. IMPRESSION: 1. Progressed small left pleural effusion, low lung volumes, and bibasilar consolidations which probably represent atelectasis, less likely pneumonia. Findings atypical for ARDS. 2. Stable partially visualized edema within the upper abdomen. Electronically Signed   By: Mitzi Hansen M.D.   On: 03/19/2018 16:23   Ct Abdomen Pelvis W Contrast  Result Date: 03/18/2018 CLINICAL DATA:  Abdominal pain with vomiting. History of pancreatitis. EXAM: CT ABDOMEN AND PELVIS WITH CONTRAST TECHNIQUE: Multidetector CT imaging of the abdomen and pelvis was performed using the standard protocol following bolus administration of intravenous contrast. CONTRAST:  OMNIPAQUE IOHEXOL 300 MG/ML  SOLN COMPARISON:  Abdominopelvic CT 02/10/2018 and 02/09/2018. FINDINGS: Lower chest: Bibasilar atelectasis has mildly improved. There is no significant pleural or pericardial effusion. Hepatobiliary: The hepatic density is within normal limits and no focal lesion identified. No significant biliary dilatation post cholecystectomy. Pancreas: The pancreas remains diffusely enlarged, although appears homogeneous without evidence of necrosis or acute hemorrhage. There is increasing peripancreatic inflammation and ill-defined fluid. No pancreatic ductal dilatation. Spleen: Normal in size without focal abnormality. Adrenals/Urinary Tract: Both adrenal glands appear normal. There are tiny cysts within the upper poles of both kidneys. No evidence of renal mass, urinary tract calculus or hydronephrosis. The bladder appears normal. Stomach/Bowel: No evidence of bowel wall thickening, distention or surrounding inflammatory change. The appendix  appears normal. Moderate stool in the proximal colon. Vascular/Lymphatic: There are no enlarged abdominal or pelvic lymph nodes. Stable 12 mm  portacaval node on image 35/2, likely reactive. The superior mesenteric and splenic veins are attenuated by the pancreatic inflammatory changes, although remain patent. No evidence of large vessel occlusion. Reproductive: Hysterectomy.  No evidence of adnexal mass. Other: Volume of pelvic ascites has not significantly increased. As above, there is slightly greater inflammatory change and ill-defined fluid surrounding the pancreas and extending into both pericolic gutters. No focal fluid collections are identified. Musculoskeletal: No acute or significant osseous findings. Severe discogenic sclerosis at L4-5. IMPRESSION: 1. Slight progression of extensive peripancreatic inflammatory changes with ill-defined fluid. No evidence of pancreatic hemorrhage, necrosis or focal fluid collection. 2. Attenuation of the splenic and superior mesenteric veins without evidence of large vessel occlusion. 3. Improved bibasilar atelectasis. Electronically Signed   By: Carey Bullocks M.D.   On: 03/18/2018 12:59   Dg Chest Port 1 View  Result Date: 03/19/2018 CLINICAL DATA:  Shortness of breath EXAM: PORTABLE CHEST 1 VIEW COMPARISON:  02/10/2018 FINDINGS: Low volume chest with streaky densities at the bases correlating with atelectasis by abdominal CT yesterday. No edema, effusion, or pneumothorax. Normal heart size and mediastinal contours. IMPRESSION: Low volume chest with bilateral lower lobe atelectasis. Electronically Signed   By: Marnee Spring M.D.   On: 03/19/2018 09:10   Dg Abd Portable 1v  Result Date: 03/19/2018 CLINICAL DATA:  Post NG tube placement EXAM: PORTABLE ABDOMEN - 1 VIEW COMPARISON:  03/19/2018 FINDINGS: Interval placement of nasogastric tube which decompresses the stomach. Nasogastric tube tip overlies the level of the mid stomach. Persistent gaseous distension of  bowel loops noted. IMPRESSION: NG tube tip is in the stomach. Electronically Signed   By: Norva Pavlov M.D.   On: 03/19/2018 17:53   [place] Ekg Site Rite  Result Date: 03/22/2018 If Site Rite image not attached, placement could not be confirmed due to current cardiac rhythm.    CBC Recent Labs  Lab 03/19/18 0330 03/20/18 0357 03/21/18 0614 03/22/18 0353 03/23/18 0600  WBC 28.2* 24.0* 20.8* 20.9* 22.2*  HGB 14.3 11.5* 9.1* 9.3* 8.7*  HCT 46.6* 37.2 29.4* 30.3* 27.7*  PLT 476* 334 295 316 320  MCV 85.2 83.0 85.0 83.0 82.2  MCH 26.1 25.7* 26.3 25.5* 25.8*  MCHC 30.7 30.9 31.0 30.7 31.4  RDW 14.5 14.6 14.8 14.6 14.7  LYMPHSABS  --   --   --   --  1.3  MONOABS  --   --   --   --  1.9*  EOSABS  --   --   --   --  0.2  BASOSABS  --   --   --   --  0.1    Chemistries  Recent Labs  Lab 03/18/18 1140 03/19/18 0330 03/20/18 0357 03/21/18 0614 03/22/18 0353 03/23/18 0600  NA 135 138 136 134* 136 135  K 3.2* 4.6 4.5 3.9 3.6 3.1*  CL 97* 103 102 99 97* 93*  CO2 24 21* 22 26 29  33*  GLUCOSE 187* 156* 173* 188* 168* 274*  BUN 14 24* 51* 51* 35* 26*  CREATININE 0.98 2.07* 3.23* 2.46* 1.65* 1.23*  CALCIUM 9.2 8.6* 8.4* 8.3* 8.4* 8.6*  MG  --   --   --  1.9  --  1.9  AST 86*  --  69* 34  --  22  ALT 36  --  57* 35  --  22  ALKPHOS 61  --  59 67  --  73  BILITOT 0.8  --  1.3* 0.9  --  0.6   ------------------------------------------------------------------------------------------------------------------  estimated creatinine clearance is 69.8 mL/min (A) (by C-G formula based on SCr of 1.23 mg/dL (H)). ------------------------------------------------------------------------------------------------------------------ No results for input(s): HGBA1C in the last 72 hours. ------------------------------------------------------------------------------------------------------------------ Recent Labs    03/23/18 0600  TRIG 727*    ------------------------------------------------------------------------------------------------------------------ No results for input(s): TSH, T4TOTAL, T3FREE, THYROIDAB in the last 72 hours.  Invalid input(s): FREET3 ------------------------------------------------------------------------------------------------------------------ No results for input(s): VITAMINB12, FOLATE, FERRITIN, TIBC, IRON, RETICCTPCT in the last 72 hours.  Coagulation profile No results for input(s): INR, PROTIME in the last 168 hours.  No results for input(s): DDIMER in the last 72 hours.  Cardiac Enzymes Recent Labs  Lab 03/19/18 0916 03/19/18 1402 03/19/18 2040  TROPONINI 0.07* 0.06* 0.06*   ------------------------------------------------------------------------------------------------------------------ Invalid input(s): POCBNP    Assessment & Plan   Acute hypoxic respiratory failure- likely due to gastric distention from pancreatitis with atelectasis.  Oxygen requirement coming down.  Currently on 2 L O2. -CT chest 2/13 with small left pleural effusion, low lung volumes, and bibasilar consolidations probably atelectasis. -Wean oxygen as able -Will monitor patient's respiratory status closely, may need to transfer back to stepdown -Incentive spirometry  Severe acute on chronic pancreatitis- NG tube came out overnight and patient refused to have it replaced. WBC increased from yesterday. -GI following -Okay to repeat CT abdomen with contrast today per nephrology (discussed with Dr. Thedore MinsSingh)- this has been ordered -Continue Zosyn  Acute renal failure- due to IV contrast, hypotension, poor p.o. intake, NSAIDs.  Creatinine continues to improve. -Neurology following -Holding home Lasix -Will give extra NS today before and after CT with contrast to help prevent worsening renal function  Ileus- persistent.  No bowel movement, but has had flatus. -Will hopefully be able to replace NG tube today,  patient refused this morning -TPN  Hypertension- BPs well-controlled -Monitor  Chronic pain syndrome -Continue IV morphine   DVT prophylaxis- lovenox     Code Status Orders  (From admission, onward)         Start     Ordered   03/18/18 1407  Full code  Continuous     03/18/18 1409        Code Status History    Date Active Date Inactive Code Status Order ID Comments User Context   02/09/2018 0921 02/15/2018 1549 Full Code [id number]  Barbaraann RondoSridharan, Prasanna, MD Inpatient   11/28/2017 0352 12/04/2017 2009 Full Code [id number]  Barbaraann RondoSridharan, Prasanna, MD Inpatient      Consults  Nephrology, gastroenterology, ID  DVT Prophylaxis lovenox  Lab Results  Component Value Date   PLT 320 03/23/2018     Time Spent in minutes   45 minutes  Greater than 50% of time spent in care coordination and counseling patient regarding the condition and plan of care.   Jinny BlossomKaty D Delwin Raczkowski M.D on 03/23/2018 at 11:45 AM  Between 7am to 6pm - Pager - [phone]  After 6pm go to [email or web address] - Social research officer, governmentpassword [email or web address]  [employer]   Office  [phone]

## 2018-03-23 NOTE — Progress Notes (Signed)
Dr Kathe Mariner notified of unsuccesful attempt to replace NG tube, patient refusing to have replaced, md stated to have address today.

## 2018-03-23 NOTE — Consult Note (Signed)
ANTICOAGULATION CONSULT NOTE - Initial Consult  Pharmacy Consult for heparin drip management Indication: DVT  Patient Measurements: Height: 5\' 7"  (170.2 cm) Weight: 213 lb 10 oz (96.9 kg) IBW/kg (Calculated) : 61.6 Heparin Dosing Weight: 83 kg  Vital Signs: Temp: 98.6 F (37 C) (02/17 1300) Temp Source: Oral (02/17 1300) BP: 152/90 (02/17 1300) Pulse Rate: 82 (02/17 1415)  Labs: Recent Labs    03/21/18 0614 03/22/18 0353 03/23/18 0600  HGB 9.1* 9.3* 8.7*  HCT 29.4* 30.3* 27.7*  PLT 295 316 320  CREATININE 2.46* 1.65* 1.23*    Estimated Creatinine Clearance: 69.8 mL/min (A) (by C-G formula based on SCr of 1.23 mg/dL (H)).   Medical History: Past Medical History:  Diagnosis Date  . Allergy   . Anxiety   . Depression   . GERD (gastroesophageal reflux disease)   . Hypertension   . IBS (irritable bowel syndrome)     Medications:  Scheduled:  . atorvastatin  40 mg Oral Daily  . chlorhexidine  15 mL Mouth Rinse BID  . fenofibrate  160 mg Oral Daily  . FLUoxetine  40 mg Oral Daily  . fluticasone  2 spray Each Nare BID  . insulin aspart  0-9 Units Subcutaneous Q4H  . mouth rinse  15 mL Mouth Rinse q12n4p  . pantoprazole  40 mg Oral Daily  . polyethylene glycol  17 g Oral Daily  . senna-docusate  2 tablet Oral BID  . sodium chloride flush  10-40 mL Intracatheter Q12H  . sodium chloride flush  3 mL Intravenous Q12H    Assessment: Terri Berry admitted for pancreatitis. 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 She is on no chronic anticoagulation here or PTA. Baseline labs have been ordered but not yet resulted.  Goal of Therapy:  Heparin level 0.3-0.7 units/ml Monitor platelets by anticoagulation protocol: Yes   Plan:  Give 5000 units bolus x 1 Start heparin infusion at 1400 units/hr Check anti-Xa level in 6 hours and daily while on heparin Continue to monitor H&H and platelets  Lowella Bandy, PharmD 03/23/2018,6:08 PM

## 2018-03-23 NOTE — Progress Notes (Signed)
PHARMACY - ADULT TOTAL PARENTERAL NUTRITION CONSULT NOTE   Pharmacy Consult for TPN Indication: Prolonged ileus and severe acute pancreatitis        Patient Measurements: Height: 5\' 7"  (170.2 cm) Weight: 213 lb 10 oz (96.9 kg) IBW/kg (Calculated) : 61.6 TPN AdjBW (KG): 70.7 Body mass index is 33.46 kg/m. Usual Weight:    Assessment:   GI: NPO Endo:  Insulin requirements in the past 24 hours: 17 units (CBGs 223-270 with upward trend) Lytes: K 3.1, Phos 1.9, Mag 1.9 Renal:Scr 1.23 Pulm: Cards:  Hepatobil: Trig 727 Neuro: JH:ERDEY 2/13 >>  TPN Access: PICC placed 2/16 TPN start date: 2/16 Nutritional Goals (per RD recommendation on 2/17 ): Clinimix E 5/20 at 83 mL/hr over 24 hours + 20% ILE at 20 mL/hr over 12 hours on Mon and Thur only.   Goal TPN rate is 83 ml/hr   Current Nutrition: NPO  Plan:  Clinimix E 5/20 at 83 mL/hr  Add MVI, trace elements  to TPN Insulin - will add 10 units to TPN and continue SSI- adjust as needed Electrolytes - MD has already ordered KCl IV times 4, will decrease to 3 runs and add KPhos IV once Monitor TPN labs,   F/U in am  Clovia Cuff, PharmD, BCPS 03/23/2018 2:11 PM

## 2018-03-24 LAB — MAGNESIUM: Magnesium: 1.9 mg/dL (ref 1.7–2.4)

## 2018-03-24 LAB — BASIC METABOLIC PANEL
Anion gap: 8 (ref 5–15)
BUN: 21 mg/dL — ABNORMAL HIGH (ref 6–20)
CO2: 30 mmol/L (ref 22–32)
Calcium: 8.3 mg/dL — ABNORMAL LOW (ref 8.9–10.3)
Chloride: 96 mmol/L — ABNORMAL LOW (ref 98–111)
Creatinine, Ser: 1.09 mg/dL — ABNORMAL HIGH (ref 0.44–1.00)
GFR calc Af Amer: >60 mL/min (ref >60)
GFR calc non Af Amer: >60 mL/min (ref >60)
Glucose, Bld: 352 mg/dL — ABNORMAL HIGH (ref 70–99)
Potassium: 3.1 mmol/L — ABNORMAL LOW (ref 3.5–5.1)
Sodium: 134 mmol/L — ABNORMAL LOW (ref 135–145)

## 2018-03-24 LAB — GLUCOSE, CAPILLARY
GLUCOSE-CAPILLARY: 292 mg/dL — AB (ref 70–99)
Glucose-Capillary: 367 mg/dL — ABNORMAL HIGH (ref 70–99)
Glucose-Capillary: 398 mg/dL — ABNORMAL HIGH (ref 70–99)
Glucose-Capillary: 243 mg/dL — ABNORMAL HIGH (ref 70–99)
Glucose-Capillary: 303 mg/dL — ABNORMAL HIGH (ref 70–99)
Glucose-Capillary: 305 mg/dL — ABNORMAL HIGH (ref 70–99)

## 2018-03-24 LAB — CULTURE, BLOOD (ROUTINE X 2)
Culture: NO GROWTH
Culture: NO GROWTH
Special Requests: ADEQUATE

## 2018-03-24 LAB — HEPARIN LEVEL (UNFRACTIONATED)
Heparin Unfractionated: <0.1 IU/mL — ABNORMAL LOW (ref 0.30–0.70)
Heparin Unfractionated: 0.22 IU/mL — ABNORMAL LOW (ref 0.30–0.70)

## 2018-03-24 LAB — CBC
HCT: 28 % — ABNORMAL LOW (ref 36.0–46.0)
Hemoglobin: 8.8 g/dL — ABNORMAL LOW (ref 12.0–15.0)
MCH: 25.9 pg — ABNORMAL LOW (ref 26.0–34.0)
MCHC: 31.4 g/dL (ref 30.0–36.0)
MCV: 82.4 fL (ref 80.0–100.0)
Platelets: 346 10*3/uL (ref 150–400)
RBC: 3.4 MIL/uL — AB (ref 3.87–5.11)
RDW: 14.6 % (ref 11.5–15.5)
WBC: 22.9 10*3/uL — ABNORMAL HIGH (ref 4.0–10.5)
nRBC: 0 % (ref 0.0–0.2)

## 2018-03-24 LAB — TRIGLYCERIDES: Triglycerides: 598 mg/dL — ABNORMAL HIGH (ref <150)

## 2018-03-24 LAB — PHOSPHORUS: Phosphorus: 2.3 mg/dL — ABNORMAL LOW (ref 2.5–4.6)

## 2018-03-24 MED ORDER — POTASSIUM CHLORIDE 10 MEQ/100ML IV SOLN
10.0000 meq | INTRAVENOUS | Status: AC
  Administered 2018-03-24 (×2): 10 meq via INTRAVENOUS
  Filled 2018-03-24 (×2): qty 100

## 2018-03-24 MED ORDER — INSULIN ASPART 100 UNIT/ML ~~LOC~~ SOLN: 0.0000 [IU] | Freq: Every day | SUBCUTANEOUS | Status: DC

## 2018-03-24 MED ORDER — HEPARIN BOLUS VIA INFUSION
1250.0000 [IU] | Freq: Once | INTRAVENOUS | Status: AC
  Administered 2018-03-24: 1250 [IU] via INTRAVENOUS
  Filled 2018-03-24: qty 1250

## 2018-03-24 MED ORDER — LORAZEPAM 2 MG/ML IJ SOLN
1.0000 mg | INTRAMUSCULAR | Status: DC | PRN
  Administered 2018-03-24 – 2018-03-27 (×3): 1 mg via INTRAVENOUS
  Filled 2018-03-24 (×3): qty 1

## 2018-03-24 MED ORDER — TRACE MINERALS CR-CU-MN-SE-ZN 10-1000-500-60 MCG/ML IV SOLN
INTRAVENOUS | Status: AC
  Administered 2018-03-24: 17:00:00 via INTRAVENOUS
  Filled 2018-03-24: qty 1992

## 2018-03-24 MED ORDER — NYSTATIN 100000 UNIT/ML MT SUSP
5.0000 mL | Freq: Four times a day (QID) | OROMUCOSAL | Status: DC
  Administered 2018-03-24 – 2018-03-27 (×13): 500000 [IU] via ORAL
  Filled 2018-03-24 (×15): qty 5

## 2018-03-24 MED ORDER — POTASSIUM PHOSPHATES 15 MMOLE/5ML IV SOLN
15.0000 mmol | Freq: Once | INTRAVENOUS | Status: AC
  Administered 2018-03-24: 15 mmol via INTRAVENOUS
  Filled 2018-03-24: qty 5

## 2018-03-24 MED ORDER — INSULIN ASPART 100 UNIT/ML ~~LOC~~ SOLN
0.0000 [IU] | SUBCUTANEOUS | Status: DC
  Administered 2018-03-24 (×2): 11 [IU] via SUBCUTANEOUS
  Administered 2018-03-25 (×2): 8 [IU] via SUBCUTANEOUS
  Administered 2018-03-25: 11 [IU] via SUBCUTANEOUS
  Administered 2018-03-25: 15 [IU] via SUBCUTANEOUS
  Administered 2018-03-25 (×2): 11 [IU] via SUBCUTANEOUS
  Administered 2018-03-26: 8 [IU] via SUBCUTANEOUS
  Administered 2018-03-26: 5 [IU] via SUBCUTANEOUS
  Administered 2018-03-26 (×2): 8 [IU] via SUBCUTANEOUS
  Administered 2018-03-26: 5 [IU] via SUBCUTANEOUS
  Administered 2018-03-26: 11 [IU] via SUBCUTANEOUS
  Administered 2018-03-27: 8 [IU] via SUBCUTANEOUS
  Administered 2018-03-27 (×2): 5 [IU] via SUBCUTANEOUS
  Administered 2018-03-27: 3 [IU] via SUBCUTANEOUS
  Filled 2018-03-24 (×18): qty 1

## 2018-03-24 MED ORDER — LIDOCAINE VISCOUS HCL 2 % MT SOLN
15.0000 mL | OROMUCOSAL | Status: DC | PRN
  Filled 2018-03-24: qty 15

## 2018-03-24 MED ORDER — HEPARIN BOLUS VIA INFUSION
2500.0000 [IU] | Freq: Once | INTRAVENOUS | Status: AC
  Administered 2018-03-24: 2500 [IU] via INTRAVENOUS
  Filled 2018-03-24: qty 2500

## 2018-03-24 NOTE — Progress Notes (Signed)
[employer] - [place] at [facility]                                                                                                                                                                                  Patient Demographics   Terri Berry, is a 43 y.o. female, DOB - [birth year], ZOX:[id number]  Admit date - 03/18/2018   Admitting Physician Milagros Loll, MD  Outpatient Primary MD for the patient is Thomes Dinning, MD   LOS - 6  Subjective: Patient very concerned about her dry mouth today.  States that her tongue is swollen and painful.  She also states that she has a lot of mouth sores.  She is requesting something to drink over and over again.  She states that her abdominal pain is the same as yesterday.  Denies any worsening shortness of breath.  Review of Systems:   CONSTITUTIONAL: No documented fever. No fatigue, weakness. No weight gain, no weight loss.  EYES: No blurry or double vision.  ENT: No tinnitus. No postnasal drip. No redness of the oropharynx.  RESPIRATORY: No cough, no wheeze, no hemoptysis. No dyspnea.  CARDIOVASCULAR: No chest pain. No orthopnea. No palpitations. No syncope.  GASTROINTESTINAL: No nausea, no vomiting or diarrhea. +abdominal pain. No melena or hematochezia.  GENITOURINARY: No dysuria or hematuria.  ENDOCRINE: No polyuria or nocturia. No heat or cold intolerance.  HEMATOLOGY: No anemia. No bruising. No bleeding.  INTEGUMENTARY: No rashes. No lesions.  MUSCULOSKELETAL: No arthritis. No swelling. No gout.  NEUROLOGIC: No numbness, tingling, or ataxia. No seizure-type activity. +headache PSYCHIATRIC: No anxiety. No insomnia. No ADD.   Vitals:   Vitals:   03/23/18 1415 03/23/18 2042 03/23/18 2046 03/24/18 0354  BP:   (!) 150/95 (!) 148/92  Pulse: 82  78 83  Resp:   (!) 22 18  Temp:   97.7 F (36.5 C) 98.2 F (36.8 C)  TempSrc:   Axillary Oral  SpO2: 92% (!) 89% 92% 98%  Weight:    96.6 kg  Height:    5\' 7"  (1.702 m)     Wt Readings from Last 3 Encounters:  03/24/18 96.6 kg  03/05/18 95.5 kg  02/15/18 94.4 kg     Intake/Output Summary (Last 24 hours) at 03/24/2018 1412 Last data filed at 03/24/2018 8119 Gross per 24 hour  Intake 3440.67 ml  Output 4500 ml  Net -1059.33 ml    Physical Exam:   GENERAL: Sitting up in bed, in no acute distress HEENT: Atraumatic, normocephalic. Extraocular muscles are intact. Pupils equal and reactive to light. Sclerae anicteric. No conjunctival injection.  Mouth is dry.  Tongue is swollen with some cracking. NECK: Supple.  There is no jugular venous distention. No bruits, no lymphadenopathy, no thyromegaly.  HEART: RRR, no murmurs clicks or gallops LUNGS: Decreased breath sounds bilaterally without any accessory muscle usage.  Nasal cannula in place. ABDOMEN: + Distention, + tenderness to palpation throughout the entire abdomen, no guarding or rebound. +hypoactive bowel sounds EXTREMITIES: No evidence of any cyanosis, clubbing, or peripheral edema.  +2 pedal and radial pulses bilaterally.  NEUROLOGIC: The patient is alert, awake, and oriented x3 with no focal motor or sensory deficits appreciated bilaterally.  SKIN: Moist and warm with no rashes appreciated. +two ulcers present on left foot, one on the dorsal surface, and one on the plantar surface, no erythema or drainage. Psych: Not anxious, depressed  Antibiotics   Anti-infectives (From admission, onward)   Start     Dose/Rate Route Frequency Ordered Stop   03/19/18 0845  piperacillin-tazobactam (ZOSYN) IVPB 3.375 g     3.375 g 12.5 mL/hr over 240 Minutes Intravenous Every 8 hours 03/19/18 0835        Medications   Scheduled Meds: . atorvastatin  40 mg Oral Daily  . chlorhexidine  15 mL Mouth Rinse BID  . fenofibrate  160 mg Oral Daily  . FLUoxetine  40 mg Oral Daily  . fluticasone  2 spray Each Nare BID  . heparin  2,500 Units Intravenous Once  . insulin aspart  0-9 Units Subcutaneous Q4H  . mouth  rinse  15 mL Mouth Rinse q12n4p  . nystatin  5 mL Oral QID  . pantoprazole  40 mg Oral Daily  . polyethylene glycol  17 g Oral Daily  . senna-docusate  2 tablet Oral BID  . sodium chloride flush  10-40 mL Intracatheter Q12H  . sodium chloride flush  3 mL Intravenous Q12H   Continuous Infusions: . Marland Kitchen.TPN (CLINIMIX-E) Adult 83 mL/hr at 03/24/18 0500  . Marland Kitchen.TPN (CLINIMIX-E) Adult    . sodium chloride Stopped (03/23/18 0627)  . sodium chloride 75 mL/hr at 03/24/18 0858  . heparin 1,700 Units/hr (03/24/18 0500)  . piperacillin-tazobactam (ZOSYN)  IV 3.375 g (03/24/18 0528)  . potassium PHOSPHATE IVPB (in mmol) 15 mmol (03/24/18 1200)   PRN Meds:.sodium chloride, acetaminophen **OR** acetaminophen, albuterol, lidocaine, LORazepam, metoCLOPramide (REGLAN) injection, morphine injection, phenol, promethazine, sodium chloride flush   Data Review:   Micro Results Recent Results (from the past 240 hour(s))  CULTURE, BLOOD (ROUTINE X 2) w Reflex to ID Panel     Status: None   Collection Time: 03/19/18  9:16 AM  Result Value Ref Range Status   Specimen Description BLOOD Left thumb  Final   Special Requests   Final    BOTTLES DRAWN AEROBIC AND ANAEROBIC Blood Culture results may not be optimal due to an inadequate volume of blood received in culture bottles   Culture   Final    NO GROWTH 5 DAYS Performed at [facility], [address] [place], [place] [address]    Report Status 03/24/2018 FINAL  Final  CULTURE, BLOOD (ROUTINE X 2) w Reflex to ID Panel     Status: None   Collection Time: 03/19/18  9:16 AM  Result Value Ref Range Status   Specimen Description BLOOD BLOOD LEFT HAND  Final   Special Requests   Final    BOTTLES DRAWN AEROBIC AND ANAEROBIC Blood Culture adequate volume   Culture   Final    NO GROWTH 5 DAYS Performed at [facility], [address] [place], [place] [address]    Report  Status 03/24/2018 FINAL  Final  MRSA PCR Screening     Status: None    Collection Time: 03/19/18  4:50 PM  Result Value Ref Range Status   MRSA by PCR NEGATIVE NEGATIVE Final    Comment:        The GeneXpert MRSA Assay (FDA approved for NASAL specimens only), is one component of a comprehensive MRSA colonization surveillance program. It is not intended to diagnose MRSA infection nor to guide or monitor treatment for MRSA infections. Performed at [facility], [address] [place], [place] [address]     Radiology Reports Dg Abd 1 View  Result Date: 03/23/2018 CLINICAL DATA:  Nasogastric tube placement. EXAM: ABDOMEN - 1 VIEW COMPARISON:  Radiographs of March 19, 2018. FINDINGS: Distal tip of nasogastric tube is seen in expected position of proximal stomach. Status post cholecystectomy. No abnormal bowel dilatation is noted. IMPRESSION: Distal tip of nasogastric tube seen in expected position of proximal stomach. Electronically Signed   By: Lupita RaiderJames  Green Jr, M.D.   On: 03/23/2018 15:00   Dg Abd 1 View  Result Date: 03/19/2018 CLINICAL DATA:  Distended abdomen. EXAM: ABDOMEN - 1 VIEW COMPARISON:  03/19/2018 FINDINGS: Mild gaseous distention of the colon. There is no small bowel dilation to suggest obstruction. Soft tissues are unremarkable.  No acute skeletal abnormality. IMPRESSION: 1. No evidence of bowel obstruction.  No acute finding. 2. Mild gaseous distention of the colon. Electronically Signed   By: Amie Portlandavid  Ormond M.D.   On: 03/19/2018 16:37   Dg Abd 1 View  Result Date: 03/19/2018 CLINICAL DATA:  History of influenza.  Abdominal infection. EXAM: ABDOMEN - 1 VIEW COMPARISON:  CT abdomen pelvis 03/18/2018 FINDINGS: Bibasilar heterogeneous opacities. Contrast material within the urinary bladder. Gaseous distended loops of small bowel within the central abdomen. Gas within the colon. No free intraperitoneal air. IMPRESSION: Findings suggestive of ileus. Electronically Signed   By: Annia Beltrew  Davis M.D.   On: 03/19/2018 09:10   Ct Chest Wo  Contrast  Result Date: 03/19/2018 CLINICAL DATA:  43 y/o F; acute respiratory failure in the setting of acute pancreatitis. EXAM: CT CHEST WITHOUT CONTRAST TECHNIQUE: Multidetector CT imaging of the chest was performed following the standard protocol without IV contrast. COMPARISON:  03/19/2018 chest radiograph. 03/18/2018 CT abdomen and pelvis. FINDINGS: Cardiovascular: No significant vascular findings. Normal heart size. No pericardial effusion. Mediastinum/Nodes: No enlarged mediastinal or axillary lymph nodes. Thyroid gland, trachea, and esophagus demonstrate no significant findings. Lungs/Pleura: Low lung volumes. Small left pleural effusion. Bibasilar consolidations. No findings of pulmonary edema. No pneumothorax. Upper Abdomen: Partially visualized edema within the upper abdomen. Musculoskeletal: No fracture is seen. IMPRESSION: 1. Progressed small left pleural effusion, low lung volumes, and bibasilar consolidations which probably represent atelectasis, less likely pneumonia. Findings atypical for ARDS. 2. Stable partially visualized edema within the upper abdomen. Electronically Signed   By: Mitzi HansenLance  Furusawa-Stratton M.D.   On: 03/19/2018 16:23   Ct Abdomen W Contrast  Result Date: 03/23/2018 CLINICAL DATA:  Follow-up pancreatitis EXAM: CT ABDOMEN WITH CONTRAST TECHNIQUE: Multidetector CT imaging of the abdomen was performed using the standard protocol following bolus administration of intravenous contrast. CONTRAST:  100mL OMNIPAQUE IOHEXOL 300 MG/ML  SOLN COMPARISON:  03/18/2018 FINDINGS: Lower chest: Bilateral pleural effusions and posterior lower lobe atelectasis is again noted. Compared with previous exam the aeration to both lung bases has worsened. Hepatobiliary: No focal liver abnormality identified at this time. Previous cholecystectomy. No biliary ductal dilatation. Pancreas: There is marked diffuse progressive  edema involving the pancreas especially the uncinate process of the head and tail  of pancreas, image 39/2 and image 48/2. Increased heterogeneity within the uncinate process and tail of pancreas is noted without frank necrosis. No main duct dilatation identified. Surrounding fat stranding and free fluid is again identified. Spleen: The spleen is prominent measuring 12.4 cm in cranial caudal dimension. No focal splenic lesion noted. Adrenals/Urinary Tract: Normal appearance of the adrenal glands. Small hypodensities within the upper pole of both kidneys is unchanged, too small to reliably characterize. No enhancing mass or hydronephrosis identified bilaterally. Stomach/Bowel: Nasogastric tube is in the body of stomach. The stomach is nondistended. There is no abnormal dilatation of the small or large bowel loops. Vascular/Lymphatic: Normal appearance of the abdominal aorta. No aneurysm. There is decrease mass effect and narrowing involving the superior mesenteric artery and portal venous confluence. Eccentric filling defect within the superior mesenteric vein just before the portal venous confluence is identified compatible with thrombus, image 53/5. This appears nonocclusive. No additional suspicious filling defects identified. The splenic vein remains patent. Other: There is fluid and edema identified within the root of the mesentery, stable to increased from previous exam. Fluid within the left upper quadrant of the abdomen extending along the splenic hilum and around the tail of pancreas is identified. There is new early encapsulation of this fluid collection with enhancing rim compatible with developing pseudocyst., image 75/5. Musculoskeletal: There are degenerative changes identified throughout the thoracic and lumbar spine. IMPRESSION: 1. Changes of acute pancreatitis are again noted. There is progressive edema/enlargement with heterogeneous enhancement of the uncinate process of pancreas and tail of pancreas. No frank necrosis identified. 2. Although there is decreased mass effect and  narrowing of the portal venous confluence and distal superior mesenteric vein there is a new eccentric filling defect within the superior mesenteric vein compatible with nonocclusive thrombus. 3. Left upper quadrant fluid with early capsular enhancement suggest developing pseudocyst. 4. Persistent bilateral pleural effusions with worsening aeration to both lung bases. Electronically Signed   By: Signa Kell M.D.   On: 03/23/2018 16:40   Ct Abdomen Pelvis W Contrast  Result Date: 03/18/2018 CLINICAL DATA:  Abdominal pain with vomiting. History of pancreatitis. EXAM: CT ABDOMEN AND PELVIS WITH CONTRAST TECHNIQUE: Multidetector CT imaging of the abdomen and pelvis was performed using the standard protocol following bolus administration of intravenous contrast. CONTRAST:  OMNIPAQUE IOHEXOL 300 MG/ML  SOLN COMPARISON:  Abdominopelvic CT 02/10/2018 and 02/09/2018. FINDINGS: Lower chest: Bibasilar atelectasis has mildly improved. There is no significant pleural or pericardial effusion. Hepatobiliary: The hepatic density is within normal limits and no focal lesion identified. No significant biliary dilatation post cholecystectomy. Pancreas: The pancreas remains diffusely enlarged, although appears homogeneous without evidence of necrosis or acute hemorrhage. There is increasing peripancreatic inflammation and ill-defined fluid. No pancreatic ductal dilatation. Spleen: Normal in size without focal abnormality. Adrenals/Urinary Tract: Both adrenal glands appear normal. There are tiny cysts within the upper poles of both kidneys. No evidence of renal mass, urinary tract calculus or hydronephrosis. The bladder appears normal. Stomach/Bowel: No evidence of bowel wall thickening, distention or surrounding inflammatory change. The appendix appears normal. Moderate stool in the proximal colon. Vascular/Lymphatic: There are no enlarged abdominal or pelvic lymph nodes. Stable 12 mm portacaval node on image 35/2, likely  reactive. The superior mesenteric and splenic veins are attenuated by the pancreatic inflammatory changes, although remain patent. No evidence of large vessel occlusion. Reproductive: Hysterectomy.  No evidence of adnexal mass. Other: Volume  of pelvic ascites has not significantly increased. As above, there is slightly greater inflammatory change and ill-defined fluid surrounding the pancreas and extending into both pericolic gutters. No focal fluid collections are identified. Musculoskeletal: No acute or significant osseous findings. Severe discogenic sclerosis at L4-5. IMPRESSION: 1. Slight progression of extensive peripancreatic inflammatory changes with ill-defined fluid. No evidence of pancreatic hemorrhage, necrosis or focal fluid collection. 2. Attenuation of the splenic and superior mesenteric veins without evidence of large vessel occlusion. 3. Improved bibasilar atelectasis. Electronically Signed   By: Carey Bullocks M.D.   On: 03/18/2018 12:59   Dg Chest Port 1 View  Result Date: 03/19/2018 CLINICAL DATA:  Shortness of breath EXAM: PORTABLE CHEST 1 VIEW COMPARISON:  02/10/2018 FINDINGS: Low volume chest with streaky densities at the bases correlating with atelectasis by abdominal CT yesterday. No edema, effusion, or pneumothorax. Normal heart size and mediastinal contours. IMPRESSION: Low volume chest with bilateral lower lobe atelectasis. Electronically Signed   By: Marnee Spring M.D.   On: 03/19/2018 09:10   Dg Abd Portable 1v  Result Date: 03/19/2018 CLINICAL DATA:  Post NG tube placement EXAM: PORTABLE ABDOMEN - 1 VIEW COMPARISON:  03/19/2018 FINDINGS: Interval placement of nasogastric tube which decompresses the stomach. Nasogastric tube tip overlies the level of the mid stomach. Persistent gaseous distension of bowel loops noted. IMPRESSION: NG tube tip is in the stomach. Electronically Signed   By: Norva Pavlov M.D.   On: 03/19/2018 17:53   [place] Ekg Site Rite  Result Date:  03/22/2018 If Site Rite image not attached, placement could not be confirmed due to current cardiac rhythm.    CBC Recent Labs  Lab 03/20/18 0357 03/21/18 0614 03/22/18 0353 03/23/18 0600 03/24/18 0152  WBC 24.0* 20.8* 20.9* 22.2* 22.9*  HGB 11.5* 9.1* 9.3* 8.7* 8.8*  HCT 37.2 29.4* 30.3* 27.7* 28.0*  PLT 334 295 316 320 346  MCV 83.0 85.0 83.0 82.2 82.4  MCH 25.7* 26.3 25.5* 25.8* 25.9*  MCHC 30.9 31.0 30.7 31.4 31.4  RDW 14.6 14.8 14.6 14.7 14.6  LYMPHSABS  --   --   --  1.3  --   MONOABS  --   --   --  1.9*  --   EOSABS  --   --   --  0.2  --   BASOSABS  --   --   --  0.1  --     Chemistries  Recent Labs  Lab 03/18/18 1140  03/20/18 0357 03/21/18 0614 03/22/18 0353 03/23/18 0600 03/24/18 0152  NA 135   < > 136 134* 136 135 134*  K 3.2*   < > 4.5 3.9 3.6 3.1* 3.1*  CL 97*   < > 102 99 97* 93* 96*  CO2 24   < > 22 26 29  33* 30  GLUCOSE 187*   < > 173* 188* 168* 274* 352*  BUN 14   < > 51* 51* 35* 26* 21*  CREATININE 0.98   < > 3.23* 2.46* 1.65* 1.23* 1.09*  CALCIUM 9.2   < > 8.4* 8.3* 8.4* 8.6* 8.3*  MG  --   --   --  1.9  --  1.9 1.9  AST 86*  --  69* 34  --  22  --   ALT 36  --  57* 35  --  22  --   ALKPHOS 61  --  59 67  --  73  --   BILITOT 0.8  --  1.3* 0.9  --  0.6  --    < > = values in this interval not displayed.   ------------------------------------------------------------------------------------------------------------------ estimated creatinine clearance is 78.6 mL/min (A) (by C-G formula based on SCr of 1.09 mg/dL (H)). ------------------------------------------------------------------------------------------------------------------ No results for input(s): HGBA1C in the last 72 hours. ------------------------------------------------------------------------------------------------------------------ Recent Labs    03/23/18 0600 03/24/18 0152  TRIG 727* 598*    ------------------------------------------------------------------------------------------------------------------ No results for input(s): TSH, T4TOTAL, T3FREE, THYROIDAB in the last 72 hours.  Invalid input(s): FREET3 ------------------------------------------------------------------------------------------------------------------ No results for input(s): VITAMINB12, FOLATE, FERRITIN, TIBC, IRON, RETICCTPCT in the last 72 hours.  Coagulation profile Recent Labs  Lab 03/23/18 1828  INR 1.15    No results for input(s): DDIMER in the last 72 hours.  Cardiac Enzymes Recent Labs  Lab 03/19/18 0916 03/19/18 1402 03/19/18 2040  TROPONINI 0.07* 0.06* 0.06*   ------------------------------------------------------------------------------------------------------------------ Invalid input(s): POCBNP    Assessment & Plan   Acute hypoxic respiratory failure- likely due to gastric distention from pancreatitis with atelectasis.  On 5 L O2 this morning. -CT abdomen performed yesterday showed some worsening aeration in the lung bases bilaterally -Wean oxygen as able -Will monitor patient's respiratory status closely, may need to transfer back to stepdown -Incentive spirometry  Severe acute on chronic pancreatitis- likely due to hypertriglyceridemia. TGs decreased from yesterday. 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 Wyn Quaker (vascular) who recommended anticoagulation, as this can precipitate significant mesenteric venous thrombosis and eventually bowel ischemia -Change from Lovenox to heparin drip 2/17  Acute renal  failure-creatinine almost back to baseline -Nephrology signed off -Holding home Lasix  Ileus- persistent.  No bowel movement, but has had flatus. -Continue NG tube -TPN  Hypertension- BPs well-controlled -Monitor  Chronic pain syndrome -Continue IV morphine   DVT prophylaxis- lovenox     Code Status Orders  (From admission, onward)         Start     Ordered   03/18/18 1407  Full code  Continuous     03/18/18 1409        Code Status History    Date Active Date Inactive Code Status Order ID Comments User Context   02/09/2018 0921 02/15/2018 1549 Full Code [id number]  Barbaraann Rondo, MD Inpatient   11/28/2017 0352 12/04/2017 2009 Full Code [id number]  Barbaraann Rondo, MD Inpatient      Consults  Nephrology, gastroenterology, ID  DVT Prophylaxis lovenox  Lab Results  Component Value Date   PLT 346 03/24/2018    Time Spent in minutes   45 minutes  Greater than 50% of time spent in care coordination and counseling patient regarding the condition and plan of care.   Jinny Blossom Khyri Hinzman M.D on 03/24/2018 at 2:12 PM  Between 7am to 6pm - Pager [phone]  After 6pm go to [email or web address] - Social research officer, government  [employer]   Office  [phone]

## 2018-03-24 NOTE — Progress Notes (Signed)
PHARMACY - ADULT TOTAL PARENTERAL NUTRITION CONSULT NOTE   Pharmacy Consult for TPN Indication: Prolonged ileus and severe acute pancreatitis        Patient Measurements: Height: 5\' 7"  (170.2 cm) Weight: 212 lb 15.4 oz (96.6 kg) IBW/kg (Calculated) : 61.6 TPN AdjBW (KG): 70.7 Body mass index is 33.35 kg/m. Usual Weight:    Assessment:   GI: NPO Endo:  Insulin requirements in the past 24 hours: 34 units (CBGs 223-270 with upward trend) Lytes: K 3.1, Phos 2.3, Mag 1.9 Renal:Scr 1.09 Pulm: Cards:  Hepatobil: Trig 598 Neuro: OM:BTDHR 2/13 >>  TPN Access: PICC placed 2/16 TPN start date: 2/16 Nutritional Goals (per RD recommendation on 2/17 ): Clinimix E 5/20 at 83 mL/hr over 24 hours + 20% ILE at 20 mL/hr over 12 hours on Mon and Thur only.   Goal TPN rate is 83 ml/hr   Current Nutrition: NPO  Plan:  Clinimix E 5/20 at 83 mL/hr  Add MVI, trace elements  to TPN Insulin - will increase to 30 units in TPN and continue SSI- adjust as needed Electrolytes -KPhos IV once and KCL IV x 2 Monitor TPN labs,   F/U in am  Bari Mantis PharmD Clinical Pharmacist 03/24/2018

## 2018-03-24 NOTE — Consult Note (Signed)
ANTICOAGULATION CONSULT NOTE - Initial Consult  Pharmacy Consult for heparin drip management Indication: DVT  Patient Measurements: Height: 5\' 7"  (170.2 cm) Weight: 213 lb 10 oz (96.9 kg) IBW/kg (Calculated) : 61.6 Heparin Dosing Weight: 83 kg  Vital Signs: Temp: 97.7 F (36.5 C) (02/17 2046) Temp Source: Axillary (02/17 2046) BP: 150/95 (02/17 2046) Pulse Rate: 78 (02/17 2046)  Labs: Recent Labs    03/22/18 0353 03/23/18 0600 03/23/18 1828 03/24/18 0152  HGB 9.3* 8.7*  --  8.8*  HCT 30.3* 27.7*  --  28.0*  PLT 316 320  --  346  APTT  --   --  28  --   LABPROT  --   --  14.6  --   INR  --   --  1.15  --   HEPARINUNFRC  --   --   --  <0.10*  CREATININE 1.65* 1.23*  --  1.09*    Estimated Creatinine Clearance: 78.7 mL/min (A) (by C-G formula based on SCr of 1.09 mg/dL (H)).   Medical History: Past Medical History:  Diagnosis Date  . Allergy   . Anxiety   . Depression   . GERD (gastroesophageal reflux disease)   . Hypertension   . IBS (irritable bowel syndrome)     Medications:  Scheduled:  . atorvastatin  40 mg Oral Daily  . chlorhexidine  15 mL Mouth Rinse BID  . fenofibrate  160 mg Oral Daily  . FLUoxetine  40 mg Oral Daily  . fluticasone  2 spray Each Nare BID  . heparin  2,500 Units Intravenous Once  . insulin aspart  0-9 Units Subcutaneous Q4H  . mouth rinse  15 mL Mouth Rinse q12n4p  . pantoprazole  40 mg Oral Daily  . polyethylene glycol  17 g Oral Daily  . senna-docusate  2 tablet Oral BID  . sodium chloride flush  10-40 mL Intracatheter Q12H  . sodium chloride flush  3 mL Intravenous Q12H    Assessment: 42 YOF admitted for pancreatitis. 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 She is on no chronic anticoagulation here or PTA. Baseline labs have been ordered but not yet resulted.  Goal of Therapy:  Heparin level 0.3-0.7 units/ml Monitor platelets by anticoagulation protocol: Yes   Plan:  Give 5000 units  bolus x 1 Start heparin infusion at 1400 units/hr Check anti-Xa level in 6 hours and daily while on heparin Continue to monitor H&H and platelets   2/18 0200 heparin level <0.1. 2500 unit bolus and increase rate to 1700 units/hr. Recheck in 6 hours.  Amilee Janvier S, PharmD 03/24/2018,2:58 AM

## 2018-03-24 NOTE — Consult Note (Signed)
ANTICOAGULATION CONSULT NOTE  Pharmacy Consult for heparin drip management Indication: DVT  Patient Measurements: Height: 5\' 7"  (170.2 cm) Weight: 212 lb 15.4 oz (96.6 kg) IBW/kg (Calculated) : 61.6 Heparin Dosing Weight: 83 kg  Vital Signs: Temp: 98.2 F (36.8 C) (02/18 0354) Temp Source: Oral (02/18 0354) BP: 148/92 (02/18 0354) Pulse Rate: 83 (02/18 0354)  Labs: Recent Labs    03/22/18 0353 03/23/18 0600 03/23/18 1828 03/24/18 0152 03/24/18 0902  HGB 9.3* 8.7*  --  8.8*  --   HCT 30.3* 27.7*  --  28.0*  --   PLT 316 320  --  346  --   APTT  --   --  28  --   --   LABPROT  --   --  14.6  --   --   INR  --   --  1.15  --   --   HEPARINUNFRC  --   --   --  <0.10* <0.10*  CREATININE 1.65* 1.23*  --  1.09*  --     Estimated Creatinine Clearance: 78.6 mL/min (A) (by C-G formula based on SCr of 1.09 mg/dL (H)).   Medical History: Past Medical History:  Diagnosis Date  . Allergy   . Anxiety   . Depression   . GERD (gastroesophageal reflux disease)   . Hypertension   . IBS (irritable bowel syndrome)     Medications:  Scheduled:  . atorvastatin  40 mg Oral Daily  . chlorhexidine  15 mL Mouth Rinse BID  . fenofibrate  160 mg Oral Daily  . FLUoxetine  40 mg Oral Daily  . fluticasone  2 spray Each Nare BID  . heparin  2,500 Units Intravenous Once  . insulin aspart  0-9 Units Subcutaneous Q4H  . mouth rinse  15 mL Mouth Rinse q12n4p  . nystatin  5 mL Oral QID  . pantoprazole  40 mg Oral Daily  . polyethylene glycol  17 g Oral Daily  . senna-docusate  2 tablet Oral BID  . sodium chloride flush  10-40 mL Intracatheter Q12H  . sodium chloride flush  3 mL Intravenous Q12H    Assessment: 42 YOF admitted for pancreatitis. 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 She is on no chronic anticoagulation here or PTA. Baseline labs have been ordered but not yet resulted.  2/18 0200 heparin level <0.1. 2500 unit bolus and increase rate  to 1700 units/hr. Recheck in 6 hours.  Goal of Therapy:  Heparin level 0.3-0.7 units/ml Monitor platelets by anticoagulation protocol: Yes   Plan:  Give 5000 units bolus x 1 Start heparin infusion at 1400 units/hr Check anti-Xa level in 6 hours and daily while on heparin Continue to monitor H&H and platelets   02/18 HL @0902 = <0.10. Will order 2500 unit bolus and increase drip to 2000 units/hr. F/u level in 6 hours. (Note that called lab at 1115 as results were still not in computer- they said they were rechecking it)  Angelique Blonder, PharmD 03/24/2018,12:53 PM

## 2018-03-24 NOTE — Progress Notes (Signed)
ANTICOAGULATION CONSULT NOTE - Initial Consult  Pharmacy Consult for Heparin  Indication: DVT  Allergies  Allergen Reactions  . Precedex [Dexmedetomidine Hcl In Nacl] Other (See Comments)    Significant Bradycardia  . Cefpodoxime     Tolerates augmentin and Keflex   . Clarithromycin   . Doxycycline   . Ferrous Gluconate   . Lactose Intolerance (Gi)   . Levofloxacin   . Rizatriptan   . Sulfa Antibiotics   . Topiramate   . Venlafaxine   . Zofran [Ondansetron Hcl]     Patient Measurements: Height: 5\' 7"  (170.2 cm) Weight: 212 lb 15.4 oz (96.6 kg) IBW/kg (Calculated) : 61.6 Heparin Dosing Weight:  83 kg   Vital Signs: Temp: 98 F (36.7 C) (02/18 2040) Temp Source: Oral (02/18 2040) BP: 145/94 (02/18 2040) Pulse Rate: 81 (02/18 2040)  Labs: Recent Labs    03/22/18 0353 03/23/18 0600 03/23/18 1828 03/24/18 0152 03/24/18 0902 03/24/18 2202  HGB 9.3* 8.7*  --  8.8*  --   --   HCT 30.3* 27.7*  --  28.0*  --   --   PLT 316 320  --  346  --   --   APTT  --   --  28  --   --   --   LABPROT  --   --  14.6  --   --   --   INR  --   --  1.15  --   --   --   HEPARINUNFRC  --   --   --  <0.10* <0.10* 0.22*  CREATININE 1.65* 1.23*  --  1.09*  --   --     Estimated Creatinine Clearance: 78.6 mL/min (A) (by C-G formula based on SCr of 1.09 mg/dL (H)).   Medical History: Past Medical History:  Diagnosis Date  . Allergy   . Anxiety   . Depression   . GERD (gastroesophageal reflux disease)   . Hypertension   . IBS (irritable bowel syndrome)     Medications:  Medications Prior to Admission  Medication Sig Dispense Refill Last Dose  . albuterol (PROVENTIL HFA;VENTOLIN HFA) 108 (90 Base) MCG/ACT inhaler Inhale 2 puffs into the lungs every 6 (six) hours as needed for wheezing or shortness of breath. 1 Inhaler 2 prn at prn  . atorvastatin (LIPITOR) 40 MG tablet Take 40 mg by mouth daily.   03/17/2018 at 2000  . fenofibrate 160 MG tablet Take 160 mg by mouth daily.    03/17/2018 at 0800  . FLUoxetine (PROZAC) 40 MG capsule Take 40 mg by mouth daily.   03/17/2018 at 0800  . fluticasone (FLONASE) 50 MCG/ACT nasal spray Place 2 sprays into both nostrils 2 (two) times daily.   03/17/2018 at 2000  . furosemide (LASIX) 20 MG tablet Take 20 mg by mouth daily.    prn at prn  . ibuprofen (ADVIL,MOTRIN) 200 MG tablet Take 200 mg by mouth every 6 (six) hours as needed.   prn at prn  . mirtazapine (REMERON) 30 MG tablet Take 30 mg by mouth at bedtime.   03/17/2018 at 2000  . [EXPIRED] morphine (MSIR) 15 MG tablet Take 15 mg by mouth every 6 (six) hours as needed.   03/18/2018 at 0730  . [EXPIRED] nystatin (MYCOSTATIN/NYSTOP) powder Apply topically 2 (two) times daily.    03/17/2018 at 2000  . omeprazole (PRILOSEC) 20 MG capsule Take 40 mg by mouth 2 (two) times daily before a meal.   03/17/2018 at  2000  . Pancrelipase, Lip-Prot-Amyl, 24000-76000 units CPEP Take 24,000-48,000 Units by mouth 3 (three) times daily before meals. Take 2 capsules by mouth three times daily with meals and 1 capsule by mouth with snacks.   03/17/2018 at 2000  . polyethylene glycol (MIRALAX / GLYCOLAX) packet Take 17 g by mouth 2 (two) times daily.   03/17/2018 at 2000  . pregabalin (LYRICA) 200 MG capsule Take 200 mg by mouth 3 (three) times daily.   03/17/2018 at 2000  . prochlorperazine (COMPAZINE) 10 MG tablet Take 10 mg by mouth every 6 (six) hours as needed.    prn at prn  . promethazine (PHENERGAN) 12.5 MG tablet Take 1 tablet (12.5 mg total) by mouth every 6 (six) hours as needed for nausea or vomiting. 30 tablet 0 prn at prn  . senna-docusate (SENOKOT-S) 8.6-50 MG tablet Take 2 tablets by mouth 2 (two) times daily. (Patient taking differently: Take 4 tablets by mouth at bedtime. ) 120 tablet 0 03/17/2018 at 2000  . clonazePAM (KLONOPIN) 1 MG tablet Take 1 mg by mouth 2 (two) times daily as needed for anxiety.    prn at prn    Assessment: 55 YOF admitted for pancreatitis. Today a CT of the abdomen  reveal thrombus of the superior mesenteric vein. She is on no chronic anticoagulation here or PTA. Baseline labs have been ordered but not yet resulted.  2/18 0200 heparin level <0.1. 2500 unit bolus and increase rate to 1700 units/hr. Recheck in 6 hours.  Goal of Therapy:  Heparin level 0.3-0.7 units/ml Monitor platelets by anticoagulation protocol: Yes   Plan:  Give 5000 units bolus x 1 Start heparin infusion at 1400 units/hr Check anti-Xa level in 6 hours and daily while on heparin Continue to monitor H&H and platelets   02/18 HL @0902 = <0.10. Will order 2500 unit bolus and increase drip to 2000 units/hr. F/u level in 6 hours. (Note that called lab at 1115 as results were still not in computer- they said they were rechecking it)  2/18: HL @ 2200 = 0.22 Will order Heparin 1250 units IV X 1 bolus and increase drip rate to 2150 units/hr.  Will recheck HL 6 hrs after rate change.  Bobbyjo Marulanda D 03/24/2018,10:42 PM

## 2018-03-24 NOTE — Progress Notes (Signed)
Inpatient Diabetes Program Recommendations  AACE/ADA: New Consensus Statement on Inpatient Glycemic Control   Target Ranges:  Prepandial:   less than 140 mg/dL      Peak postprandial:   less than 180 mg/dL (1-2 hours)      Critically ill patients:  140 - 180 mg/dL  Results for Terri Berry, Terri Berry (MRN [id number]) as of 03/24/2018 08:02  Ref. Range 03/23/2018 07:32 03/23/2018 11:41 03/23/2018 16:25 03/23/2018 20:05 03/24/2018 00:12 03/24/2018 05:19 03/24/2018 07:21  Glucose-Capillary Latest Ref Range: 70 - 99 mg/dL 528 (H)  Novolog 3 units 270 (H)  Novolog 5 units 293 (H)  Novolog 5 units 331 (H)  Novolog 7 units 292 (H)  Novolog 5 units 367 (H)  Novolog 9 units 398 (H)    Review of Glycemic Control  Current orders for Inpatient glycemic control: Novolog 0-9 units Q4H; TPN @ 83 ml/hr (with 10 units of regular insulin)  Inpatient Diabetes Program Recommendations:   Insulin addition to TPN: Based on CBGs and total of Novolog correction being given, recommend increasing regular insulin in TPN to 35 units.  Insulin-Correction: Please consider increasing Novolog correction scale to moderate scale (0-15 units Q4H).  Thanks, Orlando Penner, RN, MSN, CDE Diabetes Coordinator Inpatient Diabetes Program [phone] (Team Pager from 8am to 5pm)

## 2018-03-24 NOTE — Progress Notes (Signed)
Melodie BouillonVarnita Diana Armijo, MD [address], [address], [place], [place], [address] [address], [address], [place], [place], [address] Phone: [phone]  Fax: [phone]   Subjective:  Patient resting in bed.  Continues to have abdominal pain, no nausea or vomiting.  On TPN.  Objective: Exam: Vital signs in last 24 hours: Vitals:   03/23/18 2042 03/23/18 2046 03/24/18 0354 03/24/18 1446  BP:  (!) 150/95 (!) 148/92 118/74  Pulse:  78 83 87  Resp:  (!) 22 18 18   Temp:  97.7 F (36.5 C) 98.2 F (36.8 C) 98.2 F (36.8 C)  TempSrc:  Axillary Oral Oral  SpO2: (!) 89% 92% 98% 98%  Weight:   96.6 kg   Height:   5\' 7"  (1.702 m)    Weight change: -0.3 kg  Intake/Output Summary (Last 24 hours) at 03/24/2018 1508 Last data filed at 03/24/2018 1448 Gross per 24 hour  Intake 3440.67 ml  Output 4425 ml  Net -984.33 ml    General: No acute distress, AAO x3 Abd: Soft, NT/ND, No HSM Skin: Warm, no rashes Neck: Supple, Trachea midline   Lab Results: Lab Results  Component Value Date   WBC 22.9 (H) 03/24/2018   HGB 8.8 (L) 03/24/2018   HCT 28.0 (L) 03/24/2018   MCV 82.4 03/24/2018   PLT 346 03/24/2018   Micro Results: Recent Results (from the past 240 hour(s))  CULTURE, BLOOD (ROUTINE X 2) w Reflex to ID Panel     Status: None   Collection Time: 03/19/18  9:16 AM  Result Value Ref Range Status   Specimen Description BLOOD Left thumb  Final   Special Requests   Final    BOTTLES DRAWN AEROBIC AND ANAEROBIC Blood Culture results may not be optimal due to an inadequate volume of blood received in culture bottles   Culture   Final    NO GROWTH 5 DAYS Performed at [facility], [address] [place], [place] [address]    Report Status 03/24/2018 FINAL  Final  CULTURE, BLOOD (ROUTINE X 2) w Reflex to ID Panel     Status: None   Collection Time: 03/19/18  9:16 AM  Result Value Ref Range Status   Specimen Description BLOOD BLOOD LEFT HAND  Final   Special Requests    Final    BOTTLES DRAWN AEROBIC AND ANAEROBIC Blood Culture adequate volume   Culture   Final    NO GROWTH 5 DAYS Performed at [facility], [address] [place], [place] [address]    Report Status 03/24/2018 FINAL  Final  MRSA PCR Screening     Status: None   Collection Time: 03/19/18  4:50 PM  Result Value Ref Range Status   MRSA by PCR NEGATIVE NEGATIVE Final    Comment:        The GeneXpert MRSA Assay (FDA approved for NASAL specimens only), is one component of a comprehensive MRSA colonization surveillance program. It is not intended to diagnose MRSA infection nor to guide or monitor treatment for MRSA infections. Performed at [facility], [address] [place], [place] [address]    Studies/Results: Dg Abd 1 View  Result Date: 03/23/2018 CLINICAL DATA:  Nasogastric tube placement. EXAM: ABDOMEN - 1 VIEW COMPARISON:  Radiographs of March 19, 2018. FINDINGS: Distal tip of nasogastric tube is seen in expected position of proximal stomach. Status post cholecystectomy. No abnormal bowel dilatation is noted. IMPRESSION: Distal tip of nasogastric tube seen in expected position of proximal stomach.  Electronically Signed   By: Lupita Raider, M.D.   On: 03/23/2018 15:00   Ct Abdomen W Contrast  Result Date: 03/23/2018 CLINICAL DATA:  Follow-up pancreatitis EXAM: CT ABDOMEN WITH CONTRAST TECHNIQUE: Multidetector CT imaging of the abdomen was performed using the standard protocol following bolus administration of intravenous contrast. CONTRAST:  OMNIPAQUE IOHEXOL 300 MG/ML  SOLN COMPARISON:  03/18/2018 FINDINGS: Lower chest: Bilateral pleural effusions and posterior lower lobe atelectasis is again noted. Compared with previous exam the aeration to both lung bases has worsened. Hepatobiliary: No focal liver abnormality identified at this time. Previous cholecystectomy. No biliary ductal dilatation. Pancreas: There is marked diffuse progressive edema  involving the pancreas especially the uncinate process of the head and tail of pancreas, image 39/2 and image 48/2. Increased heterogeneity within the uncinate process and tail of pancreas is noted without frank necrosis. No main duct dilatation identified. Surrounding fat stranding and free fluid is again identified. Spleen: The spleen is prominent measuring 12.4 cm in cranial caudal dimension. No focal splenic lesion noted. Adrenals/Urinary Tract: Normal appearance of the adrenal glands. Small hypodensities within the upper pole of both kidneys is unchanged, too small to reliably characterize. No enhancing mass or hydronephrosis identified bilaterally. Stomach/Bowel: Nasogastric tube is in the body of stomach. The stomach is nondistended. There is no abnormal dilatation of the small or large bowel loops. Vascular/Lymphatic: Normal appearance of the abdominal aorta. No aneurysm. There is decrease mass effect and narrowing involving the superior mesenteric artery and portal venous confluence. Eccentric filling defect within the superior mesenteric vein just before the portal venous confluence is identified compatible with thrombus, image 53/5. This appears nonocclusive. No additional suspicious filling defects identified. The splenic vein remains patent. Other: There is fluid and edema identified within the root of the mesentery, stable to increased from previous exam. Fluid within the left upper quadrant of the abdomen extending along the splenic hilum and around the tail of pancreas is identified. There is new early encapsulation of this fluid collection with enhancing rim compatible with developing pseudocyst., image 75/5. Musculoskeletal: There are degenerative changes identified throughout the thoracic and lumbar spine. IMPRESSION: 1. Changes of acute pancreatitis are again noted. There is progressive edema/enlargement with heterogeneous enhancement of the uncinate process of pancreas and tail of pancreas. No  frank necrosis identified. 2. Although there is decreased mass effect and narrowing of the portal venous confluence and distal superior mesenteric vein there is a new eccentric filling defect within the superior mesenteric vein compatible with nonocclusive thrombus. 3. Left upper quadrant fluid with early capsular enhancement suggest developing pseudocyst. 4. Persistent bilateral pleural effusions with worsening aeration to both lung bases. Electronically Signed   By: Signa Kell M.D.   On: 03/23/2018 16:40   Medications:  Scheduled Meds: . atorvastatin  40 mg Oral Daily  . chlorhexidine  15 mL Mouth Rinse BID  . fenofibrate  160 mg Oral Daily  . FLUoxetine  40 mg Oral Daily  . fluticasone  2 spray Each Nare BID  . insulin aspart  0-9 Units Subcutaneous Q4H  . mouth rinse  15 mL Mouth Rinse q12n4p  . nystatin  5 mL Oral QID  . pantoprazole  40 mg Oral Daily  . polyethylene glycol  17 g Oral Daily  . senna-docusate  2 tablet Oral BID  . sodium chloride flush  10-40 mL Intracatheter Q12H  . sodium chloride flush  3 mL Intravenous Q12H   Continuous Infusions: . Marland KitchenTPN (CLINIMIX-E) Adult 83 mL/hr  at 03/24/18 0500  . Marland KitchenTPN (CLINIMIX-E) Adult    . sodium chloride Stopped (03/23/18 0627)  . sodium chloride 75 mL/hr at 03/24/18 0858  . heparin 2,000 Units/hr (03/24/18 1448)  . piperacillin-tazobactam (ZOSYN)  IV 3.375 g (03/24/18 1453)  . potassium PHOSPHATE IVPB (in mmol) 15 mmol (03/24/18 1200)   PRN Meds:.sodium chloride, acetaminophen **OR** acetaminophen, albuterol, lidocaine, LORazepam, metoCLOPramide (REGLAN) injection, morphine injection, phenol, promethazine, sodium chloride flush   Assessment: Active Problems:   Acute pancreatitis   Distended abdomen    Plan: CT scan yesterday also reported possible early pseudocyst formation Her triglycerides are decreased today to 598 and therefore hospitalist team would like to hold off on ICU transfer for IV insulin drip. Continue to  monitor triglycerides closely Start oral medication for hypertriglyceridemia  Vascular surgery recommended anticoagulation for SMV thrombus which patient is receiving  Would recommend starting clear liquid diet today to see if patient is able to tolerate it well, as enteral nutrition is important in pancreatitis as well  Pt was encouraged to change positions in bed as tolerated as that can help with ileus as well.  She has flatus today and states is feeling better and much less distended today as well.     LOS: 6 days   Melodie Bouillon, MD 03/24/2018, 3:08 PM

## 2018-03-24 NOTE — Care Management (Signed)
RNCM confirmed that patient is active with her PCP Thomes Dinning.  Per Milagros Reap at Medication Management  Patient has never utilized their services.   Barriers: NG, TPN.  RNCM following for needs

## 2018-03-24 NOTE — Progress Notes (Signed)
Date of Admission:  03/18/2018   Today 03/24/18      ID: Terri Berry is a 43 y.o. female  Active Problems:   Acute pancreatitis   Distended abdomen    Subjective: Has abdominal pain Says she wants to eat fish taco  Medications:  . atorvastatin  40 mg Oral Daily  . chlorhexidine  15 mL Mouth Rinse BID  . fenofibrate  160 mg Oral Daily  . FLUoxetine  40 mg Oral Daily  . fluticasone  2 spray Each Nare BID  . insulin aspart  0-9 Units Subcutaneous Q4H  . mouth rinse  15 mL Mouth Rinse q12n4p  . pantoprazole  40 mg Oral Daily  . polyethylene glycol  17 g Oral Daily  . senna-docusate  2 tablet Oral BID  . sodium chloride flush  10-40 mL Intracatheter Q12H  . sodium chloride flush  3 mL Intravenous Q12H    Objective: Vital signs in last 24 hours: Temp:  [97.7 F (36.5 C)-98.6 F (37 C)] 98.2 F (36.8 C) (02/18 0354) Pulse Rate:  [78-92] 83 (02/18 0354) Resp:  [16-22] 18 (02/18 0354) BP: (148-152)/(90-95) 148/92 (02/18 0354) SpO2:  [89 %-98 %] 98 % (02/18 0354) Weight:  [96.6 kg] 96.6 kg (02/18 0354)  PHYSICAL EXAM:  General: awake, confused at times, oriented in place, person Head: Normocephalic, without obvious abnormality, atraumatic. Eyes: Conjunctivae clear, anicteric sclerae. Pupils are equal ENT Nares normal. No drainage or sinus tenderness. Lips, mucosa, and tongue dry-NG tube.  . Lungs: b/l air entry- decreased bases Heart: s1s2 Abdomen:  distended. Bowel sounds sluggish foley Extremities: atraumatic, no cyanosis. No edema. No clubbing Skin: No rashes or lesions. Or bruising Lymph: Cervical, supraclavicular normal. Neurologic: Grossly non-focal  Lab Results Recent Labs    03/23/18 0600 03/24/18 0152  WBC 22.2* 22.9*  HGB 8.7* 8.8*  HCT 27.7* 28.0*  NA 135 134*  K 3.1* 3.1*  CL 93* 96*  CO2 33* 30  BUN 26* 21*  CREATININE 1.23* 1.09*   Liver Panel Recent Labs    03/23/18 0600  PROT 6.8  ALBUMIN 2.4*  AST 22  ALT 22  ALKPHOS 73    BILITOT 0.6   Sedimentation Rate No results for input(s): ESRSEDRATE in the last 72 hours. C-Reactive Protein No results for input(s): CRP in the last 72 hours.  Microbiology:  Studies/Results:    Changes of acute pancreatitis are again noted. There is progressive edema/enlargement with heterogeneous enhancement of the uncinate process of pancreas and tail of pancreas. No frank necrosis identified. 2. Although there is decreased mass effect and narrowing of the portal venous confluence and distal superior mesenteric vein there is a new eccentric filling defect within the superior mesenteric vein compatible with nonocclusive thrombus. 3. Left upper quadrant fluid with early capsular enhancement suggest developing pseudocyst. 4. Persistent bilateral pleural effusions with worsening aeration to both lung bases.   Assessment/Plan: 43 year old female with history of acute on chronic pancreatitis admitted with abdominal pain and found to have fever high white count   Acute on chronic pancreatitis- CT abdomen yesterday showed  1) developing pseudocyst 2) Progressive edema of the uncinate process and tail of pancreas. No pancreatic necrosis 3) SMV non occlusive thrombus   Fever resolved   Leucocytosis persist which scould be due to the pancreatitis, no necrosis, has pseudocyst which has the potential to get infected If fever recurs may need to aspirate the pseudocyst Blood cultures neg so far On  Zosyn which will cover gram-negative rods  and anaerobes which can translocate from the intestine into the pancreas. Low threshold for starting antifungal if her condition changes  Ileusrelated to the pancreatitis  AKI: has resolved due to prerenal, ileus, ATN- contrast less likely   Discussed the management with her nurse

## 2018-03-25 ENCOUNTER — Inpatient Hospital Stay: Payer: Medicaid Other

## 2018-03-25 LAB — BASIC METABOLIC PANEL
ANION GAP: 10 (ref 5–15)
BUN: 20 mg/dL (ref 6–20)
CO2: 29 mmol/L (ref 22–32)
Calcium: 8.7 mg/dL — ABNORMAL LOW (ref 8.9–10.3)
Chloride: 92 mmol/L — ABNORMAL LOW (ref 98–111)
Creatinine, Ser: 1.07 mg/dL — ABNORMAL HIGH (ref 0.44–1.00)
GFR calc Af Amer: >60 mL/min (ref >60)
GFR calc non Af Amer: >60 mL/min (ref >60)
Glucose, Bld: 352 mg/dL — ABNORMAL HIGH (ref 70–99)
Potassium: 3.2 mmol/L — ABNORMAL LOW (ref 3.5–5.1)
Sodium: 131 mmol/L — ABNORMAL LOW (ref 135–145)

## 2018-03-25 LAB — GLUCOSE, CAPILLARY
GLUCOSE-CAPILLARY: 256 mg/dL — AB (ref 70–99)
Glucose-Capillary: 291 mg/dL — ABNORMAL HIGH (ref 70–99)
Glucose-Capillary: 309 mg/dL — ABNORMAL HIGH (ref 70–99)
Glucose-Capillary: 314 mg/dL — ABNORMAL HIGH (ref 70–99)
Glucose-Capillary: 334 mg/dL — ABNORMAL HIGH (ref 70–99)
Glucose-Capillary: 360 mg/dL — ABNORMAL HIGH (ref 70–99)

## 2018-03-25 LAB — HEPARIN LEVEL (UNFRACTIONATED)
Heparin Unfractionated: 0.21 IU/mL — ABNORMAL LOW (ref 0.30–0.70)
Heparin Unfractionated: 0.21 IU/mL — ABNORMAL LOW (ref 0.30–0.70)
Heparin Unfractionated: 0.53 IU/mL (ref 0.30–0.70)

## 2018-03-25 LAB — CBC
HCT: 29.6 % — ABNORMAL LOW (ref 36.0–46.0)
Hemoglobin: 9.2 g/dL — ABNORMAL LOW (ref 12.0–15.0)
MCH: 25.6 pg — ABNORMAL LOW (ref 26.0–34.0)
MCHC: 31.1 g/dL (ref 30.0–36.0)
MCV: 82.2 fL (ref 80.0–100.0)
Platelets: 392 10*3/uL (ref 150–400)
RBC: 3.6 MIL/uL — ABNORMAL LOW (ref 3.87–5.11)
RDW: 14.7 % (ref 11.5–15.5)
WBC: 25.3 10*3/uL — ABNORMAL HIGH (ref 4.0–10.5)
nRBC: 0.1 % (ref 0.0–0.2)

## 2018-03-25 LAB — HEPATIC FUNCTION PANEL
ALT: 18 U/L (ref 0–44)
AST: 24 U/L (ref 15–41)
Albumin: 2.4 g/dL — ABNORMAL LOW (ref 3.5–5.0)
Alkaline Phosphatase: 78 U/L (ref 38–126)
Bilirubin, Direct: 0.2 mg/dL (ref 0.0–0.2)
Indirect Bilirubin: 0.2 mg/dL — ABNORMAL LOW (ref 0.3–0.9)
TOTAL PROTEIN: 6.7 g/dL (ref 6.5–8.1)
Total Bilirubin: 0.4 mg/dL (ref 0.3–1.2)

## 2018-03-25 LAB — MAGNESIUM: Magnesium: 1.8 mg/dL (ref 1.7–2.4)

## 2018-03-25 LAB — TRIGLYCERIDES: Triglycerides: 486 mg/dL — ABNORMAL HIGH (ref <150)

## 2018-03-25 LAB — PHOSPHORUS: Phosphorus: 3.1 mg/dL (ref 2.5–4.6)

## 2018-03-25 MED ORDER — TRACE MINERALS CR-CU-MN-SE-ZN 10-1000-500-60 MCG/ML IV SOLN
INTRAVENOUS | Status: AC
  Administered 2018-03-25: 19:00:00 via INTRAVENOUS
  Filled 2018-03-25: qty 1992

## 2018-03-25 MED ORDER — HEPARIN BOLUS VIA INFUSION
1300.0000 [IU] | Freq: Once | INTRAVENOUS | Status: AC
  Administered 2018-03-25: 1300 [IU] via INTRAVENOUS
  Filled 2018-03-25: qty 1300

## 2018-03-25 MED ORDER — HEPARIN BOLUS VIA INFUSION
1200.0000 [IU] | Freq: Once | INTRAVENOUS | Status: AC
  Administered 2018-03-25: 1200 [IU] via INTRAVENOUS
  Filled 2018-03-25: qty 1200

## 2018-03-25 MED ORDER — INSULIN GLARGINE 100 UNIT/ML ~~LOC~~ SOLN
20.0000 [IU] | Freq: Once | SUBCUTANEOUS | Status: AC
  Administered 2018-03-25: 20 [IU] via SUBCUTANEOUS
  Filled 2018-03-25: qty 0.2

## 2018-03-25 MED ORDER — TRACE MINERALS CR-CU-MN-SE-ZN 10-1000-500-60 MCG/ML IV SOLN
INTRAVENOUS | Status: DC
  Filled 2018-03-25: qty 1992

## 2018-03-25 MED ORDER — POTASSIUM CHLORIDE 10 MEQ/100ML IV SOLN
10.0000 meq | INTRAVENOUS | Status: AC
  Administered 2018-03-25 (×4): 10 meq via INTRAVENOUS
  Filled 2018-03-25 (×3): qty 100

## 2018-03-25 NOTE — Progress Notes (Signed)
ANTICOAGULATION CONSULT NOTE - Initial Consult  Pharmacy Consult for Heparin  Indication: DVT  Allergies  Allergen Reactions  . Precedex [Dexmedetomidine Hcl In Nacl] Other (See Comments)    Significant Bradycardia  . Cefpodoxime     Tolerates augmentin and Keflex   . Clarithromycin   . Doxycycline   . Ferrous Gluconate   . Lactose Intolerance (Gi)   . Levofloxacin   . Rizatriptan   . Sulfa Antibiotics   . Topiramate   . Venlafaxine   . Zofran [Ondansetron Hcl]     Patient Measurements: Height: 5\' 7"  (170.2 cm) Weight: 212 lb 15.4 oz (96.6 kg) IBW/kg (Calculated) : 61.6 Heparin Dosing Weight:  83 kg   Vital Signs: Temp: 98.6 F (37 C) (02/19 2050) Temp Source: Oral (02/19 2050) BP: 161/99 (02/19 2050) Pulse Rate: 79 (02/19 2050)  Labs: Recent Labs    03/23/18 0600 03/23/18 1828 03/24/18 0152  03/25/18 0504 03/25/18 1346 03/25/18 2103  HGB 8.7*  --  8.8*  --  9.2*  --   --   HCT 27.7*  --  28.0*  --  29.6*  --   --   PLT 320  --  346  --  392  --   --   APTT  --  28  --   --   --   --   --   LABPROT  --  14.6  --   --   --   --   --   INR  --  1.15  --   --   --   --   --   HEPARINUNFRC  --   --  <0.10*   < > 0.21* 0.21* 0.53  CREATININE 1.23*  --  1.09*  --  1.07*  --   --    < > = values in this interval not displayed.    Estimated Creatinine Clearance: 80.1 mL/min (A) (by C-G formula based on SCr of 1.07 mg/dL (H)).   Medical History: Past Medical History:  Diagnosis Date  . Allergy   . Anxiety   . Depression   . GERD (gastroesophageal reflux disease)   . Hypertension   . IBS (irritable bowel syndrome)     Medications:  Medications Prior to Admission  Medication Sig Dispense Refill Last Dose  . albuterol (PROVENTIL HFA;VENTOLIN HFA) 108 (90 Base) MCG/ACT inhaler Inhale 2 puffs into the lungs every 6 (six) hours as needed for wheezing or shortness of breath. 1 Inhaler 2 prn at prn  . atorvastatin (LIPITOR) 40 MG tablet Take 40 mg by mouth  daily.   03/17/2018 at 2000  . fenofibrate 160 MG tablet Take 160 mg by mouth daily.   03/17/2018 at 0800  . FLUoxetine (PROZAC) 40 MG capsule Take 40 mg by mouth daily.   03/17/2018 at 0800  . fluticasone (FLONASE) 50 MCG/ACT nasal spray Place 2 sprays into both nostrils 2 (two) times daily.   03/17/2018 at 2000  . furosemide (LASIX) 20 MG tablet Take 20 mg by mouth daily.    prn at prn  . ibuprofen (ADVIL,MOTRIN) 200 MG tablet Take 200 mg by mouth every 6 (six) hours as needed.   prn at prn  . mirtazapine (REMERON) 30 MG tablet Take 30 mg by mouth at bedtime.   03/17/2018 at 2000  . [EXPIRED] morphine (MSIR) 15 MG tablet Take 15 mg by mouth every 6 (six) hours as needed.   03/18/2018 at 0730  . [EXPIRED] nystatin (MYCOSTATIN/NYSTOP) powder  Apply topically 2 (two) times daily.    03/17/2018 at 2000  . omeprazole (PRILOSEC) 20 MG capsule Take 40 mg by mouth 2 (two) times daily before a meal.   03/17/2018 at 2000  . Pancrelipase, Lip-Prot-Amyl, 24000-76000 units CPEP Take 24,000-48,000 Units by mouth 3 (three) times daily before meals. Take 2 capsules by mouth three times daily with meals and 1 capsule by mouth with snacks.   03/17/2018 at 2000  . polyethylene glycol (MIRALAX / GLYCOLAX) packet Take 17 g by mouth 2 (two) times daily.   03/17/2018 at 2000  . pregabalin (LYRICA) 200 MG capsule Take 200 mg by mouth 3 (three) times daily.   03/17/2018 at 2000  . prochlorperazine (COMPAZINE) 10 MG tablet Take 10 mg by mouth every 6 (six) hours as needed.    prn at prn  . promethazine (PHENERGAN) 12.5 MG tablet Take 1 tablet (12.5 mg total) by mouth every 6 (six) hours as needed for nausea or vomiting. 30 tablet 0 prn at prn  . senna-docusate (SENOKOT-S) 8.6-50 MG tablet Take 2 tablets by mouth 2 (two) times daily. (Patient taking differently: Take 4 tablets by mouth at bedtime. ) 120 tablet 0 03/17/2018 at 2000  . clonazePAM (KLONOPIN) 1 MG tablet Take 1 mg by mouth 2 (two) times daily as needed for anxiety.    prn at  prn    Assessment: 17 YOF admitted for pancreatitis. 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 She is on no chronic anticoagulation here or PTA. Baseline labs have been ordered but not yet resulted.  2/18 0200 heparin level <0.1. 2500 unit bolus and increase rate to 1700 units/hr. Recheck in 6 hours.  02/18 HL @0902 = <0.10. Will order 2500 unit bolus and increase drip to 2000 units/hr. F/u level in 6 hours. (Note that called lab at 1115 as results were still not in computer- they said they were rechecking it)  2/18: HL @ 2200 = 0.22 Will order Heparin 1250 units IV X 1 bolus and increase drip rate to 2150 units/hr.  Will recheck HL 6 hrs after rate change.   2/19 AM heparin level 0.21. 1200 unit bolus and increase rate to 2300 units/hr. Recheck in 6 hours.  Goal of Therapy:  Heparin level 0.3-0.7 units/ml Monitor platelets by anticoagulation protocol: Yes   Plan:  2/19 @ 1346 heparin level 0.21. Level remains subtherapeutic. Will order 1300 unit bolus and increase infusion to 2500 units/hr. Recheck HL in 6 hours. Continue to CBC daily while on heparin infusion.   2/19 @ 2100 :  HL = 0.53 Will continue this pt on current rate and recheck HL on 2/20 @ 0300.   Scherrie Gerlach, PharmD Clinical Pharmacist 03/25/2018 9:44 PM

## 2018-03-25 NOTE — Progress Notes (Addendum)
Inpatient Diabetes Program Recommendations  AACE/ADA: New Consensus Statement on Inpatient Glycemic Control  Target Ranges:  Prepandial:   less than 140 mg/dL      Peak postprandial:   less than 180 mg/dL (1-2 hours)      Critically ill patients:  140 - 180 mg/dL   Results for [name], [name] (MRN [id number]) as of 03/25/2018 09:37  Ref. Range 03/24/2018 07:21 03/24/2018 11:29 03/24/2018 16:01 03/24/2018 20:35 03/25/2018 00:50 03/25/2018 03:57 03/25/2018 07:28  Glucose-Capillary Latest Ref Range: 70 - 99 mg/dL 387 (H) 564 (H) 332 (H) 305 (H) 291 (H) 314 (H) 360 (H)   Review of Glycemic Control  Current orders for Inpatient glycemic control: Novolog 0-15 units Q4H; TPN @ 83 ml/hr with Regular insulin 30 units  Inpatient Diabetes Program Recommendations:   Insulinaddition to TPN: Based on CBGs and total of Novolog correction being given, recommend increasing regular insulin in TPN to 45 units.  Insulin - Basal: Please consider ordering one time Lantus 20 units x1 now (based on 96.6 kg x 0.2 units).   NOTE: Noted Regular insulin in TPN was increased to 35 units today and Novolog correction scale was increased on 03/24/18. Glucose has ranged from 243-360 mg/dl over the past 24 hours. Would recommend increasing Regular insulin in TPN to 45 units and ordering one time basal insulin dose.  Thanks, Orlando Penner, RN, MSN, CDE Diabetes Coordinator Inpatient Diabetes Program [phone] (Team Pager from 8am to 5pm)

## 2018-03-25 NOTE — Progress Notes (Signed)
Terri Bouillon, MD [address], [place], [place], [address] [address], [address], [place], [place], [address] Phone: [phone]  Fax: [phone]   Subjective:  Patient tolerating clear liquid diet without difficulty.  No nausea or vomiting.  Reports abdominal pain is better.  Abdomen remains distended.  Objective: Exam: Vital signs in last 24 hours: Vitals:   03/24/18 1446 03/24/18 2040 03/25/18 0348 03/25/18 1146  BP: 118/74 (!) 145/94 (!) 141/86 (!) 157/90  Pulse: 87 81 90 78  Resp: 18 20 20    Temp: 98.2 F (36.8 C) 98 F (36.7 C) 98.4 F (36.9 C)   TempSrc: Oral Oral Oral   SpO2: 98% 97% 93% 100%  Weight:      Height:       Weight change:   Intake/Output Summary (Last 24 hours) at 03/25/2018 1502 Last data filed at 03/25/2018 1108 Gross per 24 hour  Intake 2472.6 ml  Output 3750 ml  Net -1277.4 ml    General: No acute distress, AAO x3 Abd: Soft, tender to palpation, distended, no signs of acute abdomen, No HSM Skin: Warm, no rashes Neck: Supple, Trachea midline   Lab Results: Lab Results  Component Value Date   WBC 25.3 (H) 03/25/2018   HGB 9.2 (L) 03/25/2018   HCT 29.6 (L) 03/25/2018   MCV 82.2 03/25/2018   PLT 392 03/25/2018   Micro Results: Recent Results (from the past 240 hour(s))  CULTURE, BLOOD (ROUTINE X 2) w Reflex to ID Panel     Status: None   Collection Time: 03/19/18  9:16 AM  Result Value Ref Range Status   Specimen Description BLOOD Left thumb  Final   Special Requests   Final    BOTTLES DRAWN AEROBIC AND ANAEROBIC Blood Culture results may not be optimal due to an inadequate volume of blood received in culture bottles   Culture   Final    NO GROWTH 5 DAYS Performed at [facility], [address] [place], [place] [address]    Report Status 03/24/2018 FINAL  Final  CULTURE, BLOOD (ROUTINE X 2) w Reflex to ID Panel     Status: None   Collection Time: 03/19/18  9:16 AM  Result Value Ref Range Status     Specimen Description BLOOD BLOOD LEFT HAND  Final   Special Requests   Final    BOTTLES DRAWN AEROBIC AND ANAEROBIC Blood Culture adequate volume   Culture   Final    NO GROWTH 5 DAYS Performed at [facility], [address] [place], [place] [address]    Report Status 03/24/2018 FINAL  Final  MRSA PCR Screening     Status: None   Collection Time: 03/19/18  4:50 PM  Result Value Ref Range Status   MRSA by PCR NEGATIVE NEGATIVE Final    Comment:        The GeneXpert MRSA Assay (FDA approved for NASAL specimens only), is one component of a comprehensive MRSA colonization surveillance program. It is not intended to diagnose MRSA infection nor to guide or monitor treatment for MRSA infections. Performed at [facility], [address], [place], [place] [address]    Studies/Results: Dg Chest 1 View  Result Date: 03/25/2018 CLINICAL DATA:  43 y/o female pt w abd pain and leukocytosis. Hx of GERD. Smoker. EXAM: CHEST  1 VIEW COMPARISON:  03/19/2018 FINDINGS: Nasogastric tube has been placed, tip beyond the gastroesophageal junction off the image. A RIGHT-sided PICC line tip overlies the level  of the LOWER superior vena cava. Persistent bibasilar opacities are probably not changed. No pulmonary edema. IMPRESSION: 1. Interval placement of nasogastric tube. 2. Persistent bibasilar opacities. Electronically Signed   By: Norva Pavlov M.D.   On: 03/25/2018 12:01   Ct Abdomen W Contrast  Result Date: 03/23/2018 CLINICAL DATA:  Follow-up pancreatitis EXAM: CT ABDOMEN WITH CONTRAST TECHNIQUE: Multidetector CT imaging of the abdomen was performed using the standard protocol following bolus administration of intravenous contrast. CONTRAST:  OMNIPAQUE IOHEXOL 300 MG/ML  SOLN COMPARISON:  03/18/2018 FINDINGS: Lower chest: Bilateral pleural effusions and posterior lower lobe atelectasis is again noted. Compared with previous exam the aeration to both lung bases has  worsened. Hepatobiliary: No focal liver abnormality identified at this time. Previous cholecystectomy. No biliary ductal dilatation. Pancreas: There is marked diffuse progressive edema involving the pancreas especially the uncinate process of the head and tail of pancreas, image 39/2 and image 48/2. Increased heterogeneity within the uncinate process and tail of pancreas is noted without frank necrosis. No main duct dilatation identified. Surrounding fat stranding and free fluid is again identified. Spleen: The spleen is prominent measuring 12.4 cm in cranial caudal dimension. No focal splenic lesion noted. Adrenals/Urinary Tract: Normal appearance of the adrenal glands. Small hypodensities within the upper pole of both kidneys is unchanged, too small to reliably characterize. No enhancing mass or hydronephrosis identified bilaterally. Stomach/Bowel: Nasogastric tube is in the body of stomach. The stomach is nondistended. There is no abnormal dilatation of the small or large bowel loops. Vascular/Lymphatic: Normal appearance of the abdominal aorta. No aneurysm. There is decrease mass effect and narrowing involving the superior mesenteric artery and portal venous confluence. Eccentric filling defect within the superior mesenteric vein just before the portal venous confluence is identified compatible with thrombus, image 53/5. This appears nonocclusive. No additional suspicious filling defects identified. The splenic vein remains patent. Other: There is fluid and edema identified within the root of the mesentery, stable to increased from previous exam. Fluid within the left upper quadrant of the abdomen extending along the splenic hilum and around the tail of pancreas is identified. There is new early encapsulation of this fluid collection with enhancing rim compatible with developing pseudocyst., image 75/5. Musculoskeletal: There are degenerative changes identified throughout the thoracic and lumbar spine.  IMPRESSION: 1. Changes of acute pancreatitis are again noted. There is progressive edema/enlargement with heterogeneous enhancement of the uncinate process of pancreas and tail of pancreas. No frank necrosis identified. 2. Although there is decreased mass effect and narrowing of the portal venous confluence and distal superior mesenteric vein there is a new eccentric filling defect within the superior mesenteric vein compatible with nonocclusive thrombus. 3. Left upper quadrant fluid with early capsular enhancement suggest developing pseudocyst. 4. Persistent bilateral pleural effusions with worsening aeration to both lung bases. Electronically Signed   By: Signa Kell M.D.   On: 03/23/2018 16:40   Medications:  Scheduled Meds: . atorvastatin  40 mg Oral Daily  . chlorhexidine  15 mL Mouth Rinse BID  . fenofibrate  160 mg Oral Daily  . FLUoxetine  40 mg Oral Daily  . fluticasone  2 spray Each Nare BID  . insulin aspart  0-15 Units Subcutaneous Q4H  . mouth rinse  15 mL Mouth Rinse q12n4p  . nystatin  5 mL Oral QID  . pantoprazole  40 mg Oral Daily  . polyethylene glycol  17 g Oral Daily  . senna-docusate  2 tablet Oral BID  . sodium chloride  flush  10-40 mL Intracatheter Q12H  . sodium chloride flush  3 mL Intravenous Q12H   Continuous Infusions: . Marland Kitchen.TPN (CLINIMIX-E) Adult 83 mL/hr at 03/24/18 1718  . Marland Kitchen.TPN (CLINIMIX-E) Adult    . sodium chloride Stopped (03/23/18 0627)  . sodium chloride 75 mL/hr at 03/25/18 0831  . heparin 2,500 Units/hr (03/25/18 1433)  . piperacillin-tazobactam (ZOSYN)  IV 3.375 g (03/25/18 1406)  . potassium chloride 10 mEq (03/25/18 1407)   PRN Meds:.sodium chloride, acetaminophen **OR** acetaminophen, albuterol, lidocaine, LORazepam, metoCLOPramide (REGLAN) injection, morphine injection, phenol, promethazine, sodium chloride flush   Assessment: Active Problems:   Acute pancreatitis   Distended abdomen    Plan: Continue pain control and IV fluids as  needed Continue clear liquid diet today and if patient continues to tolerated well, advance to low-fat diet tomorrow.  Triglycerides are now below 500  Patient is on anti-hyperlipidemic drug  She denies any alcohol intake but reports smoking.  I have discussed with her that smoking and alcohol both have risks of pancreatic cancer and have encouraged her to abstain from both.  Liver enzymes pending from today    LOS: 7 days   Terri BouillonVarnita Areen Trautner, MD 03/25/2018, 3:02 PM

## 2018-03-25 NOTE — Progress Notes (Addendum)
[employer] - [place] at [facility]                                                                                                                                                                                  Patient Demographics   Terri Berry, is a 43 y.o. female, DOB - 08-31-1975, ZOX:[id number]  Admit date - 03/18/2018   Admitting Physician Milagros LollSrikar Sudini, MD  Outpatient Primary MD for the patient is Thomes DinningWeeks, Cynthia, MD   LOS - 7  Subjective: Feeling much, much better today.  She feels like her abdominal pain is getting better.  She has tolerated clears without any issues.  No nausea or vomiting.  She feels like the Ativan has been really helping her anxiety.  Review of Systems:   CONSTITUTIONAL: No documented fever. No fatigue, weakness. No weight gain, no weight loss.  EYES: No blurry or double vision.  ENT: No tinnitus. No postnasal drip. No redness of the oropharynx.  RESPIRATORY: No cough, no wheeze, no hemoptysis. No dyspnea.  CARDIOVASCULAR: No chest pain. No orthopnea. No palpitations. No syncope.  GASTROINTESTINAL: No nausea, no vomiting or diarrhea. +abdominal pain. No melena or hematochezia.  GENITOURINARY: No dysuria or hematuria.  ENDOCRINE: No polyuria or nocturia. No heat or cold intolerance.  HEMATOLOGY: No anemia. No bruising. No bleeding.  INTEGUMENTARY: No rashes. No lesions.  MUSCULOSKELETAL: No arthritis. No swelling. No gout.  NEUROLOGIC: No numbness, tingling, or ataxia. No seizure-type activity. +headache PSYCHIATRIC: +anxiety. No insomnia. No ADD.   Vitals:   Vitals:   03/24/18 1446 03/24/18 2040 03/25/18 0348 03/25/18 1146  BP: 118/74 (!) 145/94 (!) 141/86 (!) 157/90  Pulse: 87 81 90 78  Resp: 18 20 20    Temp: 98.2 F (36.8 C) 98 F (36.7 C) 98.4 F (36.9 C)   TempSrc: Oral Oral Oral   SpO2: 98% 97% 93% 100%  Weight:      Height:        Wt Readings from Last 3 Encounters:  03/24/18 96.6 kg  03/05/18 95.5 kg  02/15/18  94.4 kg     Intake/Output Summary (Last 24 hours) at 03/25/2018 1348 Last data filed at 03/25/2018 1108 Gross per 24 hour  Intake 2472.6 ml  Output 4425 ml  Net -1952.4 ml    Physical Exam:   GENERAL: Sitting up in bed, in no acute distress HEENT: Atraumatic, normocephalic. Extraocular muscles are intact. Pupils equal and reactive to light. Sclerae anicteric. No conjunctival injection.  Mouth is dry.  Tongue is swollen with some cracking. NECK: Supple. There is no jugular venous distention. No bruits, no lymphadenopathy, no thyromegaly.  HEART: RRR, no murmurs clicks or gallops LUNGS: Decreased  breath sounds bilaterally without any accessory muscle usage.  Nasal cannula in place. ABDOMEN: + Distention, + mild tenderness to palpation throughout the entire abdomen, no guarding or rebound. +hypoactive bowel sounds EXTREMITIES: No evidence of any cyanosis, clubbing, or peripheral edema.  +2 pedal and radial pulses bilaterally.  NEUROLOGIC: The patient is alert, awake, and oriented x3 with no focal motor or sensory deficits appreciated bilaterally.  SKIN: Moist and warm with no rashes appreciated. +two ulcers present on left foot, one on the dorsal surface, and one on the plantar surface, no erythema or drainage. Psych: Not anxious, depressed  Antibiotics   Anti-infectives (From admission, onward)   Start     Dose/Rate Route Frequency Ordered Stop   03/19/18 0845  piperacillin-tazobactam (ZOSYN) IVPB 3.375 g     3.375 g 12.5 mL/hr over 240 Minutes Intravenous Every 8 hours 03/19/18 0835        Medications   Scheduled Meds: . atorvastatin  40 mg Oral Daily  . chlorhexidine  15 mL Mouth Rinse BID  . fenofibrate  160 mg Oral Daily  . FLUoxetine  40 mg Oral Daily  . fluticasone  2 spray Each Nare BID  . insulin aspart  0-15 Units Subcutaneous Q4H  . mouth rinse  15 mL Mouth Rinse q12n4p  . nystatin  5 mL Oral QID  . pantoprazole  40 mg Oral Daily  . polyethylene glycol  17 g Oral  Daily  . senna-docusate  2 tablet Oral BID  . sodium chloride flush  10-40 mL Intracatheter Q12H  . sodium chloride flush  3 mL Intravenous Q12H   Continuous Infusions: . Marland Kitchen.TPN (CLINIMIX-E) Adult 83 mL/hr at 03/24/18 1718  . Marland Kitchen.TPN (CLINIMIX-E) Adult    . sodium chloride Stopped (03/23/18 0627)  . sodium chloride 75 mL/hr at 03/25/18 0831  . heparin 2,300 Units/hr (03/25/18 0835)  . piperacillin-tazobactam (ZOSYN)  IV 3.375 g (03/25/18 0545)   PRN Meds:.sodium chloride, acetaminophen **OR** acetaminophen, albuterol, lidocaine, LORazepam, metoCLOPramide (REGLAN) injection, morphine injection, phenol, promethazine, sodium chloride flush   Data Review:   Micro Results Recent Results (from the past 240 hour(s))  CULTURE, BLOOD (ROUTINE X 2) w Reflex to ID Panel     Status: None   Collection Time: 03/19/18  9:16 AM  Result Value Ref Range Status   Specimen Description BLOOD Left thumb  Final   Special Requests   Final    BOTTLES DRAWN AEROBIC AND ANAEROBIC Blood Culture results may not be optimal due to an inadequate volume of blood received in culture bottles   Culture   Final    NO GROWTH 5 DAYS Performed at [facility], [address] [place], [place] [address]    Report Status 03/24/2018 FINAL  Final  CULTURE, BLOOD (ROUTINE X 2) w Reflex to ID Panel     Status: None   Collection Time: 03/19/18  9:16 AM  Result Value Ref Range Status   Specimen Description BLOOD BLOOD LEFT HAND  Final   Special Requests   Final    BOTTLES DRAWN AEROBIC AND ANAEROBIC Blood Culture adequate volume   Culture   Final    NO GROWTH 5 DAYS Performed at [facility], [address] [place], [place] [address]    Report Status 03/24/2018 FINAL  Final  MRSA PCR Screening     Status: None   Collection Time: 03/19/18  4:50 PM  Result Value Ref Range Status   MRSA by PCR NEGATIVE NEGATIVE Final  Comment:        The GeneXpert MRSA Assay (FDA approved for NASAL specimens only),  is one component of a comprehensive MRSA colonization surveillance program. It is not intended to diagnose MRSA infection nor to guide or monitor treatment for MRSA infections. Performed at [facility], [address], [place], [place] [address]     Radiology Reports Dg Chest 1 View  Result Date: 03/25/2018 CLINICAL DATA:  43 y/o female pt w abd pain and leukocytosis. Hx of GERD. Smoker. EXAM: CHEST  1 VIEW COMPARISON:  03/19/2018 FINDINGS: Nasogastric tube has been placed, tip beyond the gastroesophageal junction off the image. A RIGHT-sided PICC line tip overlies the level of the LOWER superior vena cava. Persistent bibasilar opacities are probably not changed. No pulmonary edema. IMPRESSION: 1. Interval placement of nasogastric tube. 2. Persistent bibasilar opacities. Electronically Signed   By: Norva Pavlov M.D.   On: 03/25/2018 12:01   Dg Abd 1 View  Result Date: 03/23/2018 CLINICAL DATA:  Nasogastric tube placement. EXAM: ABDOMEN - 1 VIEW COMPARISON:  Radiographs of March 19, 2018. FINDINGS: Distal tip of nasogastric tube is seen in expected position of proximal stomach. Status post cholecystectomy. No abnormal bowel dilatation is noted. IMPRESSION: Distal tip of nasogastric tube seen in expected position of proximal stomach. Electronically Signed   By: Lupita Raider, M.D.   On: 03/23/2018 15:00   Dg Abd 1 View  Result Date: 03/19/2018 CLINICAL DATA:  Distended abdomen. EXAM: ABDOMEN - 1 VIEW COMPARISON:  03/19/2018 FINDINGS: Mild gaseous distention of the colon. There is no small bowel dilation to suggest obstruction. Soft tissues are unremarkable.  No acute skeletal abnormality. IMPRESSION: 1. No evidence of bowel obstruction.  No acute finding. 2. Mild gaseous distention of the colon. Electronically Signed   By: Amie Portland M.D.   On: 03/19/2018 16:37   Dg Abd 1 View  Result Date: 03/19/2018 CLINICAL DATA:  History of influenza.  Abdominal infection. EXAM:  ABDOMEN - 1 VIEW COMPARISON:  CT abdomen pelvis 03/18/2018 FINDINGS: Bibasilar heterogeneous opacities. Contrast material within the urinary bladder. Gaseous distended loops of small bowel within the central abdomen. Gas within the colon. No free intraperitoneal air. IMPRESSION: Findings suggestive of ileus. Electronically Signed   By: Annia Belt M.D.   On: 03/19/2018 09:10   Ct Chest Wo Contrast  Result Date: 03/19/2018 CLINICAL DATA:  43 y/o F; acute respiratory failure in the setting of acute pancreatitis. EXAM: CT CHEST WITHOUT CONTRAST TECHNIQUE: Multidetector CT imaging of the chest was performed following the standard protocol without IV contrast. COMPARISON:  03/19/2018 chest radiograph. 03/18/2018 CT abdomen and pelvis. FINDINGS: Cardiovascular: No significant vascular findings. Normal heart size. No pericardial effusion. Mediastinum/Nodes: No enlarged mediastinal or axillary lymph nodes. Thyroid gland, trachea, and esophagus demonstrate no significant findings. Lungs/Pleura: Low lung volumes. Small left pleural effusion. Bibasilar consolidations. No findings of pulmonary edema. No pneumothorax. Upper Abdomen: Partially visualized edema within the upper abdomen. Musculoskeletal: No fracture is seen. IMPRESSION: 1. Progressed small left pleural effusion, low lung volumes, and bibasilar consolidations which probably represent atelectasis, less likely pneumonia. Findings atypical for ARDS. 2. Stable partially visualized edema within the upper abdomen. Electronically Signed   By: Mitzi Hansen M.D.   On: 03/19/2018 16:23   Ct Abdomen W Contrast  Result Date: 03/23/2018 CLINICAL DATA:  Follow-up pancreatitis EXAM: CT ABDOMEN WITH CONTRAST TECHNIQUE: Multidetector CT imaging of the abdomen was performed using the standard protocol following bolus administration of intravenous contrast. CONTRAST:  OMNIPAQUE IOHEXOL 300 MG/ML  SOLN COMPARISON:  03/18/2018 FINDINGS: Lower chest: Bilateral  pleural effusions and posterior lower lobe atelectasis is again noted. Compared with previous exam the aeration to both lung bases has worsened. Hepatobiliary: No focal liver abnormality identified at this time. Previous cholecystectomy. No biliary ductal dilatation. Pancreas: There is marked diffuse progressive edema involving the pancreas especially the uncinate process of the head and tail of pancreas, image 39/2 and image 48/2. Increased heterogeneity within the uncinate process and tail of pancreas is noted without frank necrosis. No main duct dilatation identified. Surrounding fat stranding and free fluid is again identified. Spleen: The spleen is prominent measuring 12.4 cm in cranial caudal dimension. No focal splenic lesion noted. Adrenals/Urinary Tract: Normal appearance of the adrenal glands. Small hypodensities within the upper pole of both kidneys is unchanged, too small to reliably characterize. No enhancing mass or hydronephrosis identified bilaterally. Stomach/Bowel: Nasogastric tube is in the body of stomach. The stomach is nondistended. There is no abnormal dilatation of the small or large bowel loops. Vascular/Lymphatic: Normal appearance of the abdominal aorta. No aneurysm. There is decrease mass effect and narrowing involving the superior mesenteric artery and portal venous confluence. Eccentric filling defect within the superior mesenteric vein just before the portal venous confluence is identified compatible with thrombus, image 53/5. This appears nonocclusive. No additional suspicious filling defects identified. The splenic vein remains patent. Other: There is fluid and edema identified within the root of the mesentery, stable to increased from previous exam. Fluid within the left upper quadrant of the abdomen extending along the splenic hilum and around the tail of pancreas is identified. There is new early encapsulation of this fluid collection with enhancing rim compatible with developing  pseudocyst., image 75/5. Musculoskeletal: There are degenerative changes identified throughout the thoracic and lumbar spine. IMPRESSION: 1. Changes of acute pancreatitis are again noted. There is progressive edema/enlargement with heterogeneous enhancement of the uncinate process of pancreas and tail of pancreas. No frank necrosis identified. 2. Although there is decreased mass effect and narrowing of the portal venous confluence and distal superior mesenteric vein there is a new eccentric filling defect within the superior mesenteric vein compatible with nonocclusive thrombus. 3. Left upper quadrant fluid with early capsular enhancement suggest developing pseudocyst. 4. Persistent bilateral pleural effusions with worsening aeration to both lung bases. Electronically Signed   By: Signa Kell M.D.   On: 03/23/2018 16:40   Ct Abdomen Pelvis W Contrast  Result Date: 03/18/2018 CLINICAL DATA:  Abdominal pain with vomiting. History of pancreatitis. EXAM: CT ABDOMEN AND PELVIS WITH CONTRAST TECHNIQUE: Multidetector CT imaging of the abdomen and pelvis was performed using the standard protocol following bolus administration of intravenous contrast. CONTRAST:  OMNIPAQUE IOHEXOL 300 MG/ML  SOLN COMPARISON:  Abdominopelvic CT 02/10/2018 and 02/09/2018. FINDINGS: Lower chest: Bibasilar atelectasis has mildly improved. There is no significant pleural or pericardial effusion. Hepatobiliary: The hepatic density is within normal limits and no focal lesion identified. No significant biliary dilatation post cholecystectomy. Pancreas: The pancreas remains diffusely enlarged, although appears homogeneous without evidence of necrosis or acute hemorrhage. There is increasing peripancreatic inflammation and ill-defined fluid. No pancreatic ductal dilatation. Spleen: Normal in size without focal abnormality. Adrenals/Urinary Tract: Both adrenal glands appear normal. There are tiny cysts within the upper poles of both  kidneys. No evidence of renal mass, urinary tract calculus or hydronephrosis. The bladder appears normal. Stomach/Bowel: No evidence of bowel wall thickening, distention or surrounding inflammatory change. The appendix appears normal. Moderate  stool in the proximal colon. Vascular/Lymphatic: There are no enlarged abdominal or pelvic lymph nodes. Stable 12 mm portacaval node on image 35/2, likely reactive. The superior mesenteric and splenic veins are attenuated by the pancreatic inflammatory changes, although remain patent. No evidence of large vessel occlusion. Reproductive: Hysterectomy.  No evidence of adnexal mass. Other: Volume of pelvic ascites has not significantly increased. As above, there is slightly greater inflammatory change and ill-defined fluid surrounding the pancreas and extending into both pericolic gutters. No focal fluid collections are identified. Musculoskeletal: No acute or significant osseous findings. Severe discogenic sclerosis at L4-5. IMPRESSION: 1. Slight progression of extensive peripancreatic inflammatory changes with ill-defined fluid. No evidence of pancreatic hemorrhage, necrosis or focal fluid collection. 2. Attenuation of the splenic and superior mesenteric veins without evidence of large vessel occlusion. 3. Improved bibasilar atelectasis. Electronically Signed   By: Carey Bullocks M.D.   On: 03/18/2018 12:59   Dg Chest Port 1 View  Result Date: 03/19/2018 CLINICAL DATA:  Shortness of breath EXAM: PORTABLE CHEST 1 VIEW COMPARISON:  02/10/2018 FINDINGS: Low volume chest with streaky densities at the bases correlating with atelectasis by abdominal CT yesterday. No edema, effusion, or pneumothorax. Normal heart size and mediastinal contours. IMPRESSION: Low volume chest with bilateral lower lobe atelectasis. Electronically Signed   By: Marnee Spring M.D.   On: 03/19/2018 09:10   Dg Abd Portable 1v  Result Date: 03/19/2018 CLINICAL DATA:  Post NG tube placement EXAM:  PORTABLE ABDOMEN - 1 VIEW COMPARISON:  03/19/2018 FINDINGS: Interval placement of nasogastric tube which decompresses the stomach. Nasogastric tube tip overlies the level of the mid stomach. Persistent gaseous distension of bowel loops noted. IMPRESSION: NG tube tip is in the stomach. Electronically Signed   By: Norva Pavlov M.D.   On: 03/19/2018 17:53   [place] Ekg Site Rite  Result Date: 03/22/2018 If Site Rite image not attached, placement could not be confirmed due to current cardiac rhythm.    CBC Recent Labs  Lab 03/21/18 0614 03/22/18 0353 03/23/18 0600 03/24/18 0152 03/25/18 0504  WBC 20.8* 20.9* 22.2* 22.9* 25.3*  HGB 9.1* 9.3* 8.7* 8.8* 9.2*  HCT 29.4* 30.3* 27.7* 28.0* 29.6*  PLT 295 316 320 346 392  MCV 85.0 83.0 82.2 82.4 82.2  MCH 26.3 25.5* 25.8* 25.9* 25.6*  MCHC 31.0 30.7 31.4 31.4 31.1  RDW 14.8 14.6 14.7 14.6 14.7  LYMPHSABS  --   --  1.3  --   --   MONOABS  --   --  1.9*  --   --   EOSABS  --   --  0.2  --   --   BASOSABS  --   --  0.1  --   --     Chemistries  Recent Labs  Lab 03/20/18 0357 03/21/18 0614 03/22/18 0353 03/23/18 0600 03/24/18 0152 03/25/18 0504  NA 136 134* 136 135 134* 131*  K 4.5 3.9 3.6 3.1* 3.1* 3.2*  CL 102 99 97* 93* 96* 92*  CO2 22 26 29  33* 30 29  GLUCOSE 173* 188* 168* 274* 352* 352*  BUN 51* 51* 35* 26* 21* 20  CREATININE 3.23* 2.46* 1.65* 1.23* 1.09* 1.07*  CALCIUM 8.4* 8.3* 8.4* 8.6* 8.3* 8.7*  MG  --  1.9  --  1.9 1.9 1.8  AST 69* 34  --  22  --   --   ALT 57* 35  --  22  --   --   ALKPHOS 59 67  --  73  --   --   BILITOT 1.3* 0.9  --  0.6  --   --    ------------------------------------------------------------------------------------------------------------------ estimated creatinine clearance is 80.1 mL/min (A) (by C-G formula based on SCr of 1.07 mg/dL (H)). ------------------------------------------------------------------------------------------------------------------ No results for input(s): HGBA1C in the  last 72 hours. ------------------------------------------------------------------------------------------------------------------ Recent Labs    03/24/18 0152 03/25/18 0504  TRIG 598* 486*   ------------------------------------------------------------------------------------------------------------------ No results for input(s): TSH, T4TOTAL, T3FREE, THYROIDAB in the last 72 hours.  Invalid input(s): FREET3 ------------------------------------------------------------------------------------------------------------------ No results for input(s): VITAMINB12, FOLATE, FERRITIN, TIBC, IRON, RETICCTPCT in the last 72 hours.  Coagulation profile Recent Labs  Lab 03/23/18 1828  INR 1.15    No results for input(s): DDIMER in the last 72 hours.  Cardiac Enzymes Recent Labs  Lab 03/19/18 0916 03/19/18 1402 03/19/18 2040  TROPONINI 0.07* 0.06* 0.06*   ------------------------------------------------------------------------------------------------------------------ Invalid input(s): POCBNP    Assessment & Plan   Acute hypoxic respiratory failure- likely due to gastric distention from pancreatitis with atelectasis.  Still on 5L O2. -Repeat CXR today -Wean oxygen as able -Incentive spirometry  Severe acute on chronic pancreatitis- likely due to hypertriglyceridemia. TGs decreasing, now below 500.  Patient is clinically improving.  Able to tolerate clears. 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 Wyn Quaker (vascular) who recommended anticoagulation, as this can precipitate significant mesenteric venous thrombosis and eventually bowel ischemia -Continue heparin drip  Acute renal failure- creatinine almost back  to baseline -Nephrology signed off -Holding home Lasix  Ileus- persistent.  No bowel movement, but has had flatus. -Continue NG tube, can hopefully remove today or tomorrow -Tolerating clear liquid diet -TPN  Hypertension- BPs well-controlled -Monitor  Chronic pain syndrome -Continue IV morphine   DVT prophylaxis- lovenox     Code Status Orders  (From admission, onward)         Start     Ordered   03/18/18 1407  Full code  Continuous     03/18/18 1409        Code Status History    Date Active Date Inactive Code Status Order ID Comments User Context   02/09/2018 0921 02/15/2018 1549 Full Code [id number]  Barbaraann Rondo, MD Inpatient   11/28/2017 0352 12/04/2017 2009 Full Code [id number]  Barbaraann Rondo, MD Inpatient      Consults  Nephrology, gastroenterology, ID  DVT Prophylaxis lovenox  Lab Results  Component Value Date   PLT 392 03/25/2018    Time Spent in minutes   45 minutes  Greater than 50% of time spent in care coordination and counseling patient regarding the condition and plan of care.   Jinny Blossom Liberta Gimpel M.D on 03/25/2018 at 1:48 PM  Between 7am to 6pm - Pager - [phone]  After 6pm go to [email or web address] - Social research officer, government  [employer]   Office  [phone]

## 2018-03-25 NOTE — Progress Notes (Signed)
Pharmacy Antibiotic Note  Terri Berry is a 43 y.o. female admitted on 03/18/2018 with recurrent pancreatitis secondary to increased triglycerides.Pharmacy has been consulted for Zosyn dosing.  Plan: Continue Zosyn EI 3.375g IV Q8hr.   Height: 5\' 7"  (170.2 cm) Weight: 212 lb 15.4 oz (96.6 kg) IBW/kg (Calculated) : 61.6  Temp (24hrs), Avg:98.2 F (36.8 C), Min:98 F (36.7 C), Max:98.4 F (36.9 C)  Recent Labs  Lab 03/19/18 1042 03/19/18 1402  03/21/18 0614 03/22/18 0353 03/23/18 0600 03/24/18 0152 03/25/18 0504  WBC  --   --    < > 20.8* 20.9* 22.2* 22.9* 25.3*  CREATININE  --   --    < > 2.46* 1.65* 1.23* 1.09* 1.07*  LATICACIDVEN 2.6* 2.3*  --   --   --   --   --   --    < > = values in this interval not displayed.    Estimated Creatinine Clearance: 80.1 mL/min (A) (by C-G formula based on SCr of 1.07 mg/dL (H)).    Allergies  Allergen Reactions  . Precedex [Dexmedetomidine Hcl In Nacl] Other (See Comments)    Significant Bradycardia  . Cefpodoxime     Tolerates augmentin and Keflex   . Clarithromycin   . Doxycycline   . Ferrous Gluconate   . Lactose Intolerance (Gi)   . Levofloxacin   . Rizatriptan   . Sulfa Antibiotics   . Topiramate   . Venlafaxine   . Zofran [Ondansetron Hcl]     Antimicrobials this admission: Zosyn 2/13 >>   Dose adjustments this admission: N/A  Microbiology results: 2/13 BCx: no growth x 1 day  2/13 MRSA PCR: negative   Thank you for allowing pharmacy to be a part of this patient's care.  Kyrsten Deleeuw A 03/25/2018 3:50 PM

## 2018-03-25 NOTE — Progress Notes (Signed)
ANTICOAGULATION CONSULT NOTE - Initial Consult  Pharmacy Consult for Heparin  Indication: DVT  Allergies  Allergen Reactions  . Precedex [Dexmedetomidine Hcl In Nacl] Other (See Comments)    Significant Bradycardia  . Cefpodoxime     Tolerates augmentin and Keflex   . Clarithromycin   . Doxycycline   . Ferrous Gluconate   . Lactose Intolerance (Gi)   . Levofloxacin   . Rizatriptan   . Sulfa Antibiotics   . Topiramate   . Venlafaxine   . Zofran [Ondansetron Hcl]     Patient Measurements: Height: 5\' 7"  (170.2 cm) Weight: 212 lb 15.4 oz (96.6 kg) IBW/kg (Calculated) : 61.6 Heparin Dosing Weight:  83 kg   Vital Signs: Temp: 98.4 F (36.9 C) (02/19 0348) Temp Source: Oral (02/19 0348) BP: 157/90 (02/19 1146) Pulse Rate: 78 (02/19 1146)  Labs: Recent Labs    03/23/18 0600 03/23/18 1828 03/24/18 0152  03/24/18 2202 03/25/18 0504 03/25/18 1346  HGB 8.7*  --  8.8*  --   --  9.2*  --   HCT 27.7*  --  28.0*  --   --  29.6*  --   PLT 320  --  346  --   --  392  --   APTT  --  28  --   --   --   --   --   LABPROT  --  14.6  --   --   --   --   --   INR  --  1.15  --   --   --   --   --   HEPARINUNFRC  --   --  <0.10*   < > 0.22* 0.21* 0.21*  CREATININE 1.23*  --  1.09*  --   --  1.07*  --    < > = values in this interval not displayed.    Estimated Creatinine Clearance: 80.1 mL/min (A) (by C-G formula based on SCr of 1.07 mg/dL (H)).   Medical History: Past Medical History:  Diagnosis Date  . Allergy   . Anxiety   . Depression   . GERD (gastroesophageal reflux disease)   . Hypertension   . IBS (irritable bowel syndrome)     Medications:  Medications Prior to Admission  Medication Sig Dispense Refill Last Dose  . albuterol (PROVENTIL HFA;VENTOLIN HFA) 108 (90 Base) MCG/ACT inhaler Inhale 2 puffs into the lungs every 6 (six) hours as needed for wheezing or shortness of breath. 1 Inhaler 2 prn at prn  . atorvastatin (LIPITOR) 40 MG tablet Take 40 mg by mouth  daily.   03/17/2018 at 2000  . fenofibrate 160 MG tablet Take 160 mg by mouth daily.   03/17/2018 at 0800  . FLUoxetine (PROZAC) 40 MG capsule Take 40 mg by mouth daily.   03/17/2018 at 0800  . fluticasone (FLONASE) 50 MCG/ACT nasal spray Place 2 sprays into both nostrils 2 (two) times daily.   03/17/2018 at 2000  . furosemide (LASIX) 20 MG tablet Take 20 mg by mouth daily.    prn at prn  . ibuprofen (ADVIL,MOTRIN) 200 MG tablet Take 200 mg by mouth every 6 (six) hours as needed.   prn at prn  . mirtazapine (REMERON) 30 MG tablet Take 30 mg by mouth at bedtime.   03/17/2018 at 2000  . [EXPIRED] morphine (MSIR) 15 MG tablet Take 15 mg by mouth every 6 (six) hours as needed.   03/18/2018 at 0730  . [EXPIRED] nystatin (MYCOSTATIN/NYSTOP) powder  Apply topically 2 (two) times daily.    03/17/2018 at 2000  . omeprazole (PRILOSEC) 20 MG capsule Take 40 mg by mouth 2 (two) times daily before a meal.   03/17/2018 at 2000  . Pancrelipase, Lip-Prot-Amyl, 24000-76000 units CPEP Take 24,000-48,000 Units by mouth 3 (three) times daily before meals. Take 2 capsules by mouth three times daily with meals and 1 capsule by mouth with snacks.   03/17/2018 at 2000  . polyethylene glycol (MIRALAX / GLYCOLAX) packet Take 17 g by mouth 2 (two) times daily.   03/17/2018 at 2000  . pregabalin (LYRICA) 200 MG capsule Take 200 mg by mouth 3 (three) times daily.   03/17/2018 at 2000  . prochlorperazine (COMPAZINE) 10 MG tablet Take 10 mg by mouth every 6 (six) hours as needed.    prn at prn  . promethazine (PHENERGAN) 12.5 MG tablet Take 1 tablet (12.5 mg total) by mouth every 6 (six) hours as needed for nausea or vomiting. 30 tablet 0 prn at prn  . senna-docusate (SENOKOT-S) 8.6-50 MG tablet Take 2 tablets by mouth 2 (two) times daily. (Patient taking differently: Take 4 tablets by mouth at bedtime. ) 120 tablet 0 03/17/2018 at 2000  . clonazePAM (KLONOPIN) 1 MG tablet Take 1 mg by mouth 2 (two) times daily as needed for anxiety.    prn at  prn    Assessment: 63 YOF admitted for pancreatitis. 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 She is on no chronic anticoagulation here or PTA. Baseline labs have been ordered but not yet resulted.  2/18 0200 heparin level <0.1. 2500 unit bolus and increase rate to 1700 units/hr. Recheck in 6 hours.  02/18 HL @0902 = <0.10. Will order 2500 unit bolus and increase drip to 2000 units/hr. F/u level in 6 hours. (Note that called lab at 1115 as results were still not in computer- they said they were rechecking it)  2/18: HL @ 2200 = 0.22 Will order Heparin 1250 units IV X 1 bolus and increase drip rate to 2150 units/hr.  Will recheck HL 6 hrs after rate change.   2/19 AM heparin level 0.21. 1200 unit bolus and increase rate to 2300 units/hr. Recheck in 6 hours.  Goal of Therapy:  Heparin level 0.3-0.7 units/ml Monitor platelets by anticoagulation protocol: Yes   Plan:  2/19 @ 1346 heparin level 0.21. Level remains subtherapeutic. Will order 1300 unit bolus and increase infusion to 2500 units/hr. Recheck HL in 6 hours. Continue to CBC daily while on heparin infusion.   Gardner Candle, PharmD, BCPS Clinical Pharmacist 03/25/2018 2:26 PM

## 2018-03-25 NOTE — Progress Notes (Addendum)
Nutrition Follow Up Note   DOCUMENTATION CODES:   Obesity unspecified  INTERVENTION:    Change to Clinimix 5/15 with electrolytes at 35m/hr  Continue 20% ILE (lipids) at 250 mL over 12 hours on Mondays and Thursdays ONLY.   Regimen @ 85mhr will provide 1415kcal/day, 100g/day protein, 199270molume    Continue MVI and trace elements daily   Insulin in TPN per pharmacy (currently ordered for 45 units)   Daily weights   NUTRITION DIAGNOSIS:   Inadequate oral intake related to inability to eat as evidenced by NPO status(ileus).  GOAL:   Patient will meet greater than or equal to 90% of their needs  -met with TPN  MONITOR:   Diet advancement, Labs, Weight trends, Skin, I & O's, Other (Comment)(TPN)  ASSESSMENT:   [age]ar old female with PMHx of anxiety, depression, GERD, HTN, IBS, hx cholecystectomy, hx abdominal hysterectomy admitted with severe acute on chronic pancreatitis, acute hypoxic respiratory failure, acute renal failure, also with persistent ileus.   Pt initiated on clear liquid diet yesterday and tolerated well. Blood glucoses remain elevated. Will change to Clinimix 5/15 which will decrease carbohydrate load from 398g down to 299g. Will increase insulin in bag by 15 units as recommended by diabetes coordinator. If pt is able to advance to full liquid diet today and tolerates, can start weaning TPN tomorrow. Will plan to recheck triglycerides tomorrow. If pt unable to tolerate diet advancement; recommend post pyloric NGT placed to the ligament of treitz and tube feeds. Pt receiving 3.8L daily between TPN and IVF. Pt's weight is stable. Urine output 4.3L x 24 hrs. Refeed labs stabilizing. No BM since admit; pt reports passing flatus yesterday.    Medications reviewed and include: insulin, protonix, miralax, senokot, NaCl @75ml /hr, heparin, zosyn, KCl  Labs reviewed: Na 131(L), K 3.2(L), Cl 92(L), creat 1.07(H), P 3.1 wnl, Mg 1.8 wnl Triglycerides- 598(H)-  2/18 Wbc- 25.3(H), Hgb 9.2(L), Hct 29.6(L) cbgs- 291, 314, 360 x 24 hrs  Diet Order:   Diet Order            Diet clear liquid Room service appropriate? Yes; Fluid consistency: Thin  Diet effective now             EDUCATION NEEDS:   No education needs have been identified at this time  Skin:  Skin Assessment: Reviewed RN Assessment  Last BM:  PTA (03/17/2018 per chart)  Height:   Ht Readings from Last 1 Encounters:  03/24/18 5' 7"  (1.702 m)   Weight:   Wt Readings from Last 1 Encounters:  03/24/18 96.6 kg   Ideal Body Weight:  61.4 kg  BMI:  Body mass index is 33.35 kg/m.  Estimated Nutritional Needs:   Kcal:  2000-2330 (MSJ x 1.2-1.4)  Protein:  100-120 grams (1-1.2 grams/kg)  Fluid:  1.8-2.1 L/day (30-35 mL/kg IBW)  CasKoleen Distance, RD, LDN Pager #- [phone]#- [phone] Hours Pager: [phone]

## 2018-03-25 NOTE — Progress Notes (Addendum)
PHARMACY - ADULT TOTAL PARENTERAL NUTRITION CONSULT NOTE   Pharmacy Consult for TPN Indication: Prolonged ileus and severe acute pancreatitis  Patient Measurements: Height: 5\' 7"  (170.2 cm) Weight: 212 lb 15.4 oz (96.6 kg) IBW/kg (Calculated) : 61.6 TPN AdjBW (KG): 70.4 Body mass index is 33.35 kg/m. Usual Weight:    Assessment:   GI: Clear liquids ordered 2/18 Endo:  Insulin requirements in the past 24 hours: 34 units (CBGs 243-398 ) Lytes: K 3.2, Phos 3.1, Mag 1.8  Ca 8.7  Na 131 Renal:Scr 1.07 Pulm: Cards:  Hepatobil: Trig 598 Neuro: BS:JGGEZ 2/13 >>  TPN Access: PICC placed 2/16 TPN start date: 2/16 Nutritional Goals (per RD recommendation on 2/17 ): Clinimix E 5/20 at 83 mL/hr over 24 hours + 20% ILE at 20 mL/hr over 12 hours on Mon and Thur only.   Goal TPN rate is 83 ml/hr   Current Nutrition: NPO  Plan:  Per discussion with dietician , plan to change TPN to Clinimix E 5/15 at 83 mL/hr..Ordered clear liquids, when tolerate full liquids plan to decrease TPN rate Add MVI, trace elements  to TPN Insulin - will increase to 45 units in TPN - per discussion with Diabetes RN and will order Lantus 20 units x 1 dose, and... continue SSI- which is now at moderate coverage q4h.  *Watch for rate change or switch to Enteral feeds** IVF- NS @ 75 ml/hr Electrolytes - Will order KCL IV x4 Monitor TPN labs,   F/U in am  Bari Mantis PharmD Clinical Pharmacist 03/25/2018

## 2018-03-25 NOTE — Progress Notes (Signed)
ANTICOAGULATION CONSULT NOTE - Initial Consult  Pharmacy Consult for Heparin  Indication: DVT  Allergies  Allergen Reactions  . Precedex [Dexmedetomidine Hcl In Nacl] Other (See Comments)    Significant Bradycardia  . Cefpodoxime     Tolerates augmentin and Keflex   . Clarithromycin   . Doxycycline   . Ferrous Gluconate   . Lactose Intolerance (Gi)   . Levofloxacin   . Rizatriptan   . Sulfa Antibiotics   . Topiramate   . Venlafaxine   . Zofran [Ondansetron Hcl]     Patient Measurements: Height: 5\' 7"  (170.2 cm) Weight: 212 lb 15.4 oz (96.6 kg) IBW/kg (Calculated) : 61.6 Heparin Dosing Weight:  83 kg   Vital Signs: Temp: 98.4 F (36.9 C) (02/19 0348) Temp Source: Oral (02/19 0348) BP: 141/86 (02/19 0348) Pulse Rate: 90 (02/19 0348)  Labs: Recent Labs    03/23/18 0600 03/23/18 1828  03/24/18 0152 03/24/18 0902 03/24/18 2202 03/25/18 0504  HGB 8.7*  --   --  8.8*  --   --  9.2*  HCT 27.7*  --   --  28.0*  --   --  29.6*  PLT 320  --   --  346  --   --  392  APTT  --  28  --   --   --   --   --   LABPROT  --  14.6  --   --   --   --   --   INR  --  1.15  --   --   --   --   --   HEPARINUNFRC  --   --    < > <0.10* <0.10* 0.22* 0.21*  CREATININE 1.23*  --   --  1.09*  --   --  1.07*   < > = values in this interval not displayed.    Estimated Creatinine Clearance: 80.1 mL/min (A) (by C-G formula based on SCr of 1.07 mg/dL (H)).   Medical History: Past Medical History:  Diagnosis Date  . Allergy   . Anxiety   . Depression   . GERD (gastroesophageal reflux disease)   . Hypertension   . IBS (irritable bowel syndrome)     Medications:  Medications Prior to Admission  Medication Sig Dispense Refill Last Dose  . albuterol (PROVENTIL HFA;VENTOLIN HFA) 108 (90 Base) MCG/ACT inhaler Inhale 2 puffs into the lungs every 6 (six) hours as needed for wheezing or shortness of breath. 1 Inhaler 2 prn at prn  . atorvastatin (LIPITOR) 40 MG tablet Take 40 mg by mouth  daily.   03/17/2018 at 2000  . fenofibrate 160 MG tablet Take 160 mg by mouth daily.   03/17/2018 at 0800  . FLUoxetine (PROZAC) 40 MG capsule Take 40 mg by mouth daily.   03/17/2018 at 0800  . fluticasone (FLONASE) 50 MCG/ACT nasal spray Place 2 sprays into both nostrils 2 (two) times daily.   03/17/2018 at 2000  . furosemide (LASIX) 20 MG tablet Take 20 mg by mouth daily.    prn at prn  . ibuprofen (ADVIL,MOTRIN) 200 MG tablet Take 200 mg by mouth every 6 (six) hours as needed.   prn at prn  . mirtazapine (REMERON) 30 MG tablet Take 30 mg by mouth at bedtime.   03/17/2018 at 2000  . [EXPIRED] morphine (MSIR) 15 MG tablet Take 15 mg by mouth every 6 (six) hours as needed.   03/18/2018 at 0730  . [EXPIRED] nystatin (MYCOSTATIN/NYSTOP) powder  Apply topically 2 (two) times daily.    03/17/2018 at 2000  . omeprazole (PRILOSEC) 20 MG capsule Take 40 mg by mouth 2 (two) times daily before a meal.   03/17/2018 at 2000  . Pancrelipase, Lip-Prot-Amyl, 24000-76000 units CPEP Take 24,000-48,000 Units by mouth 3 (three) times daily before meals. Take 2 capsules by mouth three times daily with meals and 1 capsule by mouth with snacks.   03/17/2018 at 2000  . polyethylene glycol (MIRALAX / GLYCOLAX) packet Take 17 g by mouth 2 (two) times daily.   03/17/2018 at 2000  . pregabalin (LYRICA) 200 MG capsule Take 200 mg by mouth 3 (three) times daily.   03/17/2018 at 2000  . prochlorperazine (COMPAZINE) 10 MG tablet Take 10 mg by mouth every 6 (six) hours as needed.    prn at prn  . promethazine (PHENERGAN) 12.5 MG tablet Take 1 tablet (12.5 mg total) by mouth every 6 (six) hours as needed for nausea or vomiting. 30 tablet 0 prn at prn  . senna-docusate (SENOKOT-S) 8.6-50 MG tablet Take 2 tablets by mouth 2 (two) times daily. (Patient taking differently: Take 4 tablets by mouth at bedtime. ) 120 tablet 0 03/17/2018 at 2000  . clonazePAM (KLONOPIN) 1 MG tablet Take 1 mg by mouth 2 (two) times daily as needed for anxiety.    prn at  prn    Assessment: 39 YOF admitted for pancreatitis. 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 She is on no chronic anticoagulation here or PTA. Baseline labs have been ordered but not yet resulted.  2/18 0200 heparin level <0.1. 2500 unit bolus and increase rate to 1700 units/hr. Recheck in 6 hours.  Goal of Therapy:  Heparin level 0.3-0.7 units/ml Monitor platelets by anticoagulation protocol: Yes   Plan:  Give 5000 units bolus x 1 Start heparin infusion at 1400 units/hr Check anti-Xa level in 6 hours and daily while on heparin Continue to monitor H&H and platelets   02/18 HL @0902 = <0.10. Will order 2500 unit bolus and increase drip to 2000 units/hr. F/u level in 6 hours. (Note that called lab at 1115 as results were still not in computer- they said they were rechecking it)  2/18: HL @ 2200 = 0.22 Will order Heparin 1250 units IV X 1 bolus and increase drip rate to 2150 units/hr.  Will recheck HL 6 hrs after rate change.   2/19 AM heparin level 0.21. 1200 unit bolus and increase rate to 2300 units/hr. Recheck in 6 hours.   Marian Grandt S 03/25/2018,6:31 AM

## 2018-03-26 ENCOUNTER — Inpatient Hospital Stay: Payer: Medicaid Other

## 2018-03-26 DIAGNOSIS — K55029 Acute infarction of small intestine, extent unspecified: Secondary | ICD-10-CM

## 2018-03-26 DIAGNOSIS — K863 Pseudocyst of pancreas: Secondary | ICD-10-CM

## 2018-03-26 LAB — BLOOD GAS, ARTERIAL
Acid-Base Excess: 3.1 mmol/L — ABNORMAL HIGH (ref 0.0–2.0)
Bicarbonate: 27 mmol/L (ref 20.0–28.0)
FIO2: 40
O2 Saturation: 97.1 %
PH ART: 7.46 — AB (ref 7.350–7.450)
Patient temperature: 37
pCO2 arterial: 38 mmHg (ref 32.0–48.0)
pO2, Arterial: 87 mmHg (ref 83.0–108.0)

## 2018-03-26 LAB — CBC
HCT: 30.6 % — ABNORMAL LOW (ref 36.0–46.0)
HCT: 29.6 % — ABNORMAL LOW (ref 36.0–46.0)
Hemoglobin: 9.7 g/dL — ABNORMAL LOW (ref 12.0–15.0)
Hemoglobin: 9.3 g/dL — ABNORMAL LOW (ref 12.0–15.0)
MCH: 25.9 pg — ABNORMAL LOW (ref 26.0–34.0)
MCH: 25.6 pg — ABNORMAL LOW (ref 26.0–34.0)
MCHC: 31.7 g/dL (ref 30.0–36.0)
MCHC: 31.4 g/dL (ref 30.0–36.0)
MCV: 81.8 fL (ref 80.0–100.0)
MCV: 81.5 fL (ref 80.0–100.0)
Platelets: 442 10*3/uL — ABNORMAL HIGH (ref 150–400)
Platelets: 461 10*3/uL — ABNORMAL HIGH (ref 150–400)
RBC: 3.74 MIL/uL — ABNORMAL LOW (ref 3.87–5.11)
RBC: 3.63 MIL/uL — AB (ref 3.87–5.11)
RDW: 14.8 % (ref 11.5–15.5)
RDW: 14.8 % (ref 11.5–15.5)
WBC: 30 10*3/uL — AB (ref 4.0–10.5)
WBC: 31.8 10*3/uL — ABNORMAL HIGH (ref 4.0–10.5)
nRBC: 0.1 % (ref 0.0–0.2)
nRBC: 0 % (ref 0.0–0.2)

## 2018-03-26 LAB — COMPREHENSIVE METABOLIC PANEL
ALT: 17 U/L (ref 0–44)
AST: 24 U/L (ref 15–41)
Albumin: 2.6 g/dL — ABNORMAL LOW (ref 3.5–5.0)
Alkaline Phosphatase: 80 U/L (ref 38–126)
Anion gap: 12 (ref 5–15)
BUN: 21 mg/dL — ABNORMAL HIGH (ref 6–20)
CO2: 28 mmol/L (ref 22–32)
Calcium: 8.9 mg/dL (ref 8.9–10.3)
Chloride: 91 mmol/L — ABNORMAL LOW (ref 98–111)
Creatinine, Ser: 0.93 mg/dL (ref 0.44–1.00)
GFR calc Af Amer: >60 mL/min (ref >60)
GFR calc non Af Amer: >60 mL/min (ref >60)
Glucose, Bld: 245 mg/dL — ABNORMAL HIGH (ref 70–99)
Potassium: 3.3 mmol/L — ABNORMAL LOW (ref 3.5–5.1)
Sodium: 131 mmol/L — ABNORMAL LOW (ref 135–145)
Total Bilirubin: 0.7 mg/dL (ref 0.3–1.2)
Total Protein: 7 g/dL (ref 6.5–8.1)

## 2018-03-26 LAB — GLUCOSE, CAPILLARY
GLUCOSE-CAPILLARY: 269 mg/dL — AB (ref 70–99)
Glucose-Capillary: 223 mg/dL — ABNORMAL HIGH (ref 70–99)
Glucose-Capillary: 245 mg/dL — ABNORMAL HIGH (ref 70–99)
Glucose-Capillary: 270 mg/dL — ABNORMAL HIGH (ref 70–99)
Glucose-Capillary: 280 mg/dL — ABNORMAL HIGH (ref 70–99)
Glucose-Capillary: 316 mg/dL — ABNORMAL HIGH (ref 70–99)

## 2018-03-26 LAB — DIFFERENTIAL
Abs Immature Granulocytes: 2.42 10*3/uL — ABNORMAL HIGH (ref 0.00–0.07)
Basophils Absolute: 0.2 10*3/uL — ABNORMAL HIGH (ref 0.0–0.1)
Basophils Relative: 1 %
Eosinophils Absolute: 0.5 10*3/uL (ref 0.0–0.5)
Eosinophils Relative: 1 %
Immature Granulocytes: 8 %
LYMPHS PCT: 8 %
Lymphs Abs: 2.4 10*3/uL (ref 0.7–4.0)
MONOS PCT: 6 %
Monocytes Absolute: 1.8 10*3/uL — ABNORMAL HIGH (ref 0.1–1.0)
Neutro Abs: 24.5 10*3/uL — ABNORMAL HIGH (ref 1.7–7.7)
Neutrophils Relative %: 76 %
Smear Review: NORMAL

## 2018-03-26 LAB — PROCALCITONIN: Procalcitonin: 0.79 ng/mL

## 2018-03-26 LAB — PATHOLOGIST SMEAR REVIEW

## 2018-03-26 LAB — HEPARIN LEVEL (UNFRACTIONATED)
HEPARIN UNFRACTIONATED: 0.4 [IU]/mL (ref 0.30–0.70)
Heparin Unfractionated: 0.28 IU/mL — ABNORMAL LOW (ref 0.30–0.70)
Heparin Unfractionated: 0.37 IU/mL (ref 0.30–0.70)

## 2018-03-26 LAB — PHOSPHORUS: Phosphorus: 2.7 mg/dL (ref 2.5–4.6)

## 2018-03-26 LAB — POTASSIUM: POTASSIUM: 3.4 mmol/L — AB (ref 3.5–5.1)

## 2018-03-26 LAB — TRIGLYCERIDES: Triglycerides: 433 mg/dL — ABNORMAL HIGH (ref <150)

## 2018-03-26 LAB — MAGNESIUM: Magnesium: 1.8 mg/dL (ref 1.7–2.4)

## 2018-03-26 MED ORDER — INSULIN GLARGINE 100 UNIT/ML ~~LOC~~ SOLN
20.0000 [IU] | Freq: Once | SUBCUTANEOUS | Status: AC
  Administered 2018-03-26: 20 [IU] via SUBCUTANEOUS
  Filled 2018-03-26: qty 0.2

## 2018-03-26 MED ORDER — POTASSIUM CHLORIDE 10 MEQ/100ML IV SOLN
10.0000 meq | INTRAVENOUS | Status: AC
  Administered 2018-03-26 (×4): 10 meq via INTRAVENOUS
  Filled 2018-03-26 (×4): qty 100

## 2018-03-26 MED ORDER — HEPARIN BOLUS VIA INFUSION
1200.0000 [IU] | Freq: Once | INTRAVENOUS | Status: AC
  Administered 2018-03-26: 1200 [IU] via INTRAVENOUS
  Filled 2018-03-26: qty 1200

## 2018-03-26 MED ORDER — HYDRALAZINE HCL 20 MG/ML IJ SOLN
5.0000 mg | INTRAMUSCULAR | Status: DC | PRN
  Administered 2018-03-26: 5 mg via INTRAVENOUS
  Filled 2018-03-26: qty 1

## 2018-03-26 MED ORDER — BISACODYL 10 MG RE SUPP
10.0000 mg | Freq: Once | RECTAL | Status: AC
  Administered 2018-03-27: 10 mg via RECTAL
  Filled 2018-03-26: qty 1

## 2018-03-26 MED ORDER — SODIUM CHLORIDE 0.9 % IV SOLN: 100.0000 mg | INTRAVENOUS | Status: DC

## 2018-03-26 MED ORDER — SODIUM CHLORIDE 0.9 % IV SOLN
200.0000 mg | Freq: Once | INTRAVENOUS | Status: DC
  Filled 2018-03-26: qty 200

## 2018-03-26 MED ORDER — HYDRALAZINE HCL 20 MG/ML IJ SOLN: 10.0000 mg | INTRAMUSCULAR | Status: DC | PRN

## 2018-03-26 MED ORDER — POTASSIUM CHLORIDE 10 MEQ/100ML IV SOLN
10.0000 meq | INTRAVENOUS | Status: AC
  Administered 2018-03-26 – 2018-03-27 (×4): 10 meq via INTRAVENOUS
  Filled 2018-03-26 (×2): qty 100

## 2018-03-26 MED ORDER — FAT EMULSION PLANT BASED 20 % IV EMUL
250.0000 mL | INTRAVENOUS | Status: AC
  Administered 2018-03-26: 250 mL via INTRAVENOUS
  Filled 2018-03-26: qty 250

## 2018-03-26 MED ORDER — LABETALOL HCL 5 MG/ML IV SOLN
10.0000 mg | INTRAVENOUS | Status: DC | PRN
  Administered 2018-03-26: 10 mg via INTRAVENOUS
  Filled 2018-03-26: qty 4

## 2018-03-26 MED ORDER — TRACE MINERALS CR-CU-MN-SE-ZN 10-1000-500-60 MCG/ML IV SOLN
INTRAVENOUS | Status: AC
  Administered 2018-03-26: 18:00:00 via INTRAVENOUS
  Filled 2018-03-26: qty 1992

## 2018-03-26 MED ORDER — FLUCONAZOLE IN SODIUM CHLORIDE 400-0.9 MG/200ML-% IV SOLN
800.0000 mg | Freq: Once | INTRAVENOUS | Status: AC
  Administered 2018-03-26: 800 mg via INTRAVENOUS
  Filled 2018-03-26 (×2): qty 400

## 2018-03-26 MED ORDER — FLUCONAZOLE IN SODIUM CHLORIDE 400-0.9 MG/200ML-% IV SOLN
400.0000 mg | INTRAVENOUS | Status: DC
  Administered 2018-03-27: 400 mg via INTRAVENOUS
  Filled 2018-03-26 (×2): qty 200

## 2018-03-26 NOTE — Progress Notes (Signed)
Pharmacy Electrolyte Monitoring Consult:  Pharmacy consulted to assist in monitoring and replacing electrolytes in this 43 year old female with history of acute on chronic pancreatitis admitted with abdominal pain and found to have fever high white count     Labs:   Potassium (mmol/L)  Date Value  03/26/2018 3.4 (L)   Magnesium (mg/dL)  Date Value  96/22/2979 1.8   Phosphorus (mg/dL)  Date Value  89/21/1941 2.7   Calcium (mg/dL)  Date Value  74/09/1446 8.9   Albumin (g/dL)  Date Value  18/56/3149 2.6 (L)  03/10/2018 4.4   Assessment/Plan: Potassium was 3.3 earlier in the day. She has received 40 mEq of IV KCl with little response. Will order KCL IV x4 bags- will recheck electrolytes with tomorrow am labs.  Burnis Medin, PharmD Clinical Pharmacist

## 2018-03-26 NOTE — Progress Notes (Signed)
Date of Admission:  03/18/2018  Today 03/26/18   T  ID: Terri Berry is a 43 y.o. female  Active Problems:   Acute pancreatitis   Distended abdomen    Subjective: Still has abdominal pain Has NG tube Allowed to have clear liquids Temp trend   Medications:  . atorvastatin  40 mg Oral Daily  . chlorhexidine  15 mL Mouth Rinse BID  . fenofibrate  160 mg Oral Daily  . FLUoxetine  40 mg Oral Daily  . fluticasone  2 spray Each Nare BID  . insulin aspart  0-15 Units Subcutaneous Q4H  . mouth rinse  15 mL Mouth Rinse q12n4p  . nystatin  5 mL Oral QID  . pantoprazole  40 mg Oral Daily  . polyethylene glycol  17 g Oral Daily  . senna-docusate  2 tablet Oral BID  . sodium chloride flush  10-40 mL Intracatheter Q12H  . sodium chloride flush  3 mL Intravenous Q12H    Objective: Vital signs in last 24 hours: Temp:  [98.5 F (36.9 C)-98.6 F (37 C)] 98.5 F (36.9 C) (02/20 0402) Pulse Rate:  [78-88] 88 (02/20 0402) Resp:  [20] 20 (02/20 0402) BP: (151-161)/(90-99) 151/99 (02/20 0402) SpO2:  [97 %-100 %] 97 % (02/20 0402)  PHYSICAL EXAM:  General: lethargic due to pain meds- answers questions Lungs: b/l air entry- decreased bases. Heart: s1s2 Abdomen:distended and tight, BS sluggish  Extremities: atraumatic, no cyanosis. No edema. No clubbing Skin: No rashes or lesions. Or bruising Lymph: Cervical, supraclavicular normal. Neurologic: Grossly non-focal  Lab Results   Recent Labs    03/25/18 0504 03/26/18 0311  WBC 25.3* 30.0*  HGB 9.2* 9.7*  HCT 29.6* 30.6*  NA 131* 131*  K 3.2* 3.3*  CL 92* 91*  CO2 29 28  BUN 20 21*  CREATININE 1.07* 0.93   Liver Panel Recent Labs    03/25/18 0504 03/26/18 0311  PROT 6.7 7.0  ALBUMIN 2.4* 2.6*  AST 24 24  ALT 18 17  ALKPHOS 78 80  BILITOT 0.4 0.7  BILIDIR 0.2  --   IBILI 0.2*  --    Sedimentation Rate No results for input(s): ESRSEDRATE in the last 72 hours. C-Reactive Protein No results for input(s): CRP  in the last 72 hours.  Microbiology:  Studies/Results: Dg Chest 1 View  Result Date: 03/26/2018 CLINICAL DATA:  Leukocytosis EXAM: CHEST  1 VIEW COMPARISON:  Yesterday FINDINGS: Gastric suction tube at least reaches the stomach. Right upper extremity PICC with tip at the upper cavoatrial junction. Low volume chest with streaky opacities over the diaphragm. No edema. No visible pleural effusion, although small volume pleural fluid was seen on abdominal CT 03/23/2018. No pneumothorax. Partially seen stomach shows gaseous distension. IMPRESSION: Very low lung volumes with atelectasis in both lower lobes, stable from yesterday. Superimposed infection is not excluded. Electronically Signed   By: Marnee Spring M.D.   On: 03/26/2018 10:15   Dg Chest 1 View  Result Date: 03/25/2018 CLINICAL DATA:  43 y/o female pt w abd pain and leukocytosis. Hx of GERD. Smoker. EXAM: CHEST  1 VIEW COMPARISON:  03/19/2018 FINDINGS: Nasogastric tube has been placed, tip beyond the gastroesophageal junction off the image. A RIGHT-sided PICC line tip overlies the level of the LOWER superior vena cava. Persistent bibasilar opacities are probably not changed. No pulmonary edema. IMPRESSION: 1. Interval placement of nasogastric tube. 2. Persistent bibasilar opacities. Electronically Signed   By: Norva Pavlov M.D.   On: 03/25/2018  12:01     Assessment/Plan: 43 year old female with history of acute on chronic pancreatitis admitted with abdominal pain and found to have fever high white count   Acute on chronic pancreatitis- CT abdomen from 2/17 showed 1) developing pseudocyst 2) Progressive edema of the uncinate process and tail of pancreas. No pancreatic necrosis 3) SMV non occlusive thrombus   Fever resolvedbut leucocytosis persist infact it started increasing after sh was allowed to have clear liquids concern for infected pseudocyst/ worsening pancreatitis- will add antifungal as high risk for translocation  due to ileus. will get blood culture for fungus .  Zosyn adequately covers anerobes, enterocococi, GNR from GI tract and no advantage over meropenem as she does not have ESBL   Ileusrelated to the pancreatitis  AKI: has resolved due to prerenal, ileus, ATN- contrast less likely  Discussed the management with GI and hospitalist She will need further imaging and aggressive management-

## 2018-03-26 NOTE — Progress Notes (Signed)
ANTICOAGULATION CONSULT NOTE   Pharmacy Consult for Heparin  Indication: DVT  Patient Measurements: Height: 5\' 7"  (170.2 cm) Weight: 212 lb 15.4 oz (96.6 kg) IBW/kg (Calculated) : 61.6 Heparin Dosing Weight:  83 kg   Vital Signs: Temp: 97.6 F (36.4 C) (02/20 1349) Temp Source: Oral (02/20 1349) BP: 143/99 (02/20 1515) Pulse Rate: 84 (02/20 1515)  Labs: Recent Labs    03/23/18 1828  03/24/18 0152  03/25/18 0504  03/25/18 2103 03/26/18 0311 03/26/18 1122  HGB  --    < > 8.8*  --  9.2*  --   --  9.7* 9.3*  HCT  --    < > 28.0*  --  29.6*  --   --  30.6* 29.6*  PLT  --    < > 346  --  392  --   --  442* 461*  APTT 28  --   --   --   --   --   --   --   --   LABPROT 14.6  --   --   --   --   --   --   --   --   INR 1.15  --   --   --   --   --   --   --   --   HEPARINUNFRC  --   --  <0.10*   < > 0.21*   < > 0.53 0.28* 0.37  CREATININE  --   --  1.09*  --  1.07*  --   --  0.93  --    < > = values in this interval not displayed.    Estimated Creatinine Clearance: 92.1 mL/min (by C-G formula based on SCr of 0.93 mg/dL).   Medical History: Past Medical History:  Diagnosis Date  . Allergy   . Anxiety   . Depression   . GERD (gastroesophageal reflux disease)   . Hypertension   . IBS (irritable bowel syndrome)     Medications:  Medications Prior to Admission  Medication Sig Dispense Refill Last Dose  . albuterol (PROVENTIL HFA;VENTOLIN HFA) 108 (90 Base) MCG/ACT inhaler Inhale 2 puffs into the lungs every 6 (six) hours as needed for wheezing or shortness of breath. 1 Inhaler 2 prn at prn  . atorvastatin (LIPITOR) 40 MG tablet Take 40 mg by mouth daily.   03/17/2018 at 2000  . fenofibrate 160 MG tablet Take 160 mg by mouth daily.   03/17/2018 at 0800  . FLUoxetine (PROZAC) 40 MG capsule Take 40 mg by mouth daily.   03/17/2018 at 0800  . fluticasone (FLONASE) 50 MCG/ACT nasal spray Place 2 sprays into both nostrils 2 (two) times daily.   03/17/2018 at 2000  . furosemide  (LASIX) 20 MG tablet Take 20 mg by mouth daily.    prn at prn  . ibuprofen (ADVIL,MOTRIN) 200 MG tablet Take 200 mg by mouth every 6 (six) hours as needed.   prn at prn  . mirtazapine (REMERON) 30 MG tablet Take 30 mg by mouth at bedtime.   03/17/2018 at 2000  . [EXPIRED] morphine (MSIR) 15 MG tablet Take 15 mg by mouth every 6 (six) hours as needed.   03/18/2018 at 0730  . [EXPIRED] nystatin (MYCOSTATIN/NYSTOP) powder Apply topically 2 (two) times daily.    03/17/2018 at 2000  . omeprazole (PRILOSEC) 20 MG capsule Take 40 mg by mouth 2 (two) times daily before a meal.   03/17/2018 at 2000  .  Pancrelipase, Lip-Prot-Amyl, 24000-76000 units CPEP Take 24,000-48,000 Units by mouth 3 (three) times daily before meals. Take 2 capsules by mouth three times daily with meals and 1 capsule by mouth with snacks.   03/17/2018 at 2000  . polyethylene glycol (MIRALAX / GLYCOLAX) packet Take 17 g by mouth 2 (two) times daily.   03/17/2018 at 2000  . pregabalin (LYRICA) 200 MG capsule Take 200 mg by mouth 3 (three) times daily.   03/17/2018 at 2000  . prochlorperazine (COMPAZINE) 10 MG tablet Take 10 mg by mouth every 6 (six) hours as needed.    prn at prn  . promethazine (PHENERGAN) 12.5 MG tablet Take 1 tablet (12.5 mg total) by mouth every 6 (six) hours as needed for nausea or vomiting. 30 tablet 0 prn at prn  . senna-docusate (SENOKOT-S) 8.6-50 MG tablet Take 2 tablets by mouth 2 (two) times daily. (Patient taking differently: Take 4 tablets by mouth at bedtime. ) 120 tablet 0 03/17/2018 at 2000  . clonazePAM (KLONOPIN) 1 MG tablet Take 1 mg by mouth 2 (two) times daily as needed for anxiety.    prn at prn    Assessment: 17 YOF admitted for pancreatitis. 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 She is on no chronic anticoagulation here or PTA. Baseline labs have been ordered but not yet resulted. CBC stable  Heparin Course:  2/18 0200 HL <0.1. 2500 unit bolus and increase rate to  1700 units/hr. 2/18 0902 HL <0.10. Will order 2500 unit bolus then 2000 units/hr 2/18 2200 HL 0.22: 1250 unit bolus and inc drip rate to 2150 units/hr.  2/19 AM HL 0.21 1200 unit bolus and inc rate to 2300 units/hr.  2/19 1346 HL 0.21 1300 unit bolus and increase to 2500 units/hr.  2/19 2100 HL 0.53 2/20 AM HL 0.28: 1200 unit bolus and increase to 2700 units/hr.  2/20 1122 HL 0.37 2/20 1746 HL 0.40  Goal of Therapy:  Heparin level 0.3-0.7 units/ml Monitor platelets by anticoagulation protocol: Yes   Plan:  This is the second consecutive therapeutic heparin level. Will continue current rate and check the next level with AM labs. CBC in am  Lowella Bandy, PharmD Clinical Pharmacist 03/26/2018 5:39 PM

## 2018-03-26 NOTE — Progress Notes (Addendum)
Inpatient Diabetes Program Recommendations  AACE/ADA: New Consensus Statement on Inpatient Glycemic Control  Target Ranges:  Prepandial:   less than 140 mg/dL      Peak postprandial:   less than 180 mg/dL (1-2 hours)      Critically ill patients:  140 - 180 mg/dL   Results for Terri Berry, Terri Berry (MRN [id number]) as of 03/26/2018 11:19  Ref. Range 03/25/2018 07:28 03/25/2018 11:45 03/25/2018 16:37 03/25/2018 20:47 03/26/2018 00:01 03/26/2018 03:57 03/26/2018 07:51  Glucose-Capillary Latest Ref Range: 70 - 99 mg/dL 056 (H)  Novolog 15 units 309 (H)  Novolog 11 units  Lantus 20 units 334 (H)  Novolog 11 units 256 (H)  Novolog 8 units 245 (H)  Novolog 5 units 223 (H)  Novolog 5 units 269 (H)  Novolog 8 units   Review of Glycemic Control  Current orders for Inpatient glycemic control: Novolog 0-15 units Q4H; TPN @ 83 ml/hr with Regular insulin 45 units  Inpatient Diabetes Program Recommendations:   Insulin - Basal: If TPN will be continued today (even if at a lower rate of 40 ml/hr or more), please consider ordering one time Lantus 20 units x1 now. Will recommend assessing glycemic trends daily to determine if low dose basal is needed while on TPN.  NOTE: One time Lantus 20 units given on 03/25/18 and insulin in TPN was increased to 45 units (which started at 6 pm on 03/25/18).   Thanks, Orlando Penner, RN, MSN, CDE Diabetes Coordinator Inpatient Diabetes Program [phone] (Team Pager from 8am to 5pm)

## 2018-03-26 NOTE — Progress Notes (Signed)
MD notified: The patient is confused this morning. she is talking in her sleep and tried tried to eat some gauze when the nurse tech was getting her blood glucose level. It is more of an intermitten confusion as she is still able to answer some questions.

## 2018-03-26 NOTE — Care Management (Signed)
RNCM attempted to meet with patient for complete assessment.  Lab work being drawn at this time.  Notified that patient is pending transfer for [facility]

## 2018-03-26 NOTE — Progress Notes (Signed)
Melodie Bouillon, MD [address], [place], [place], [address] [address], [place], [place], [address] Phone: [phone]  Fax: [phone]   Subjective: Patient laying in bed and appears more somnolent today with some shortness of breath.  No nausea or vomiting.  Tolerating clear liquids.  Continues to have abdominal pain.  Afebrile.   Objective: Exam: Vital signs in last 24 hours: Vitals:   03/26/18 1129 03/26/18 1145 03/26/18 1349 03/26/18 1401  BP: (!) 160/127 (!) 165/103 (!) 173/96 (!) 166/107  Pulse: 81  86 88  Resp: (!) 28 (!) 28 20   Temp: 98.6 F (37 C)  97.6 F (36.4 C)   TempSrc: Oral  Oral   SpO2: 100%  100%   Weight:      Height:       Weight change:   Intake/Output Summary (Last 24 hours) at 03/26/2018 1535 Last data filed at 03/26/2018 1432 Gross per 24 hour  Intake 5834.4 ml  Output 2600 ml  Net 3234.4 ml    General: No acute distress, AAO x3 Abd: Soft, tender to palpation, distended, No HSM Skin: Warm, no rashes Neck: Supple, Trachea midline   Lab Results: Lab Results  Component Value Date   WBC 31.8 (H) 03/26/2018   HGB 9.3 (L) 03/26/2018   HCT 29.6 (L) 03/26/2018   MCV 81.5 03/26/2018   PLT 461 (H) 03/26/2018   Micro Results: Recent Results (from the past 240 hour(s))  CULTURE, BLOOD (ROUTINE X 2) w Reflex to ID Panel     Status: None   Collection Time: 03/19/18  9:16 AM  Result Value Ref Range Status   Specimen Description BLOOD Left thumb  Final   Special Requests   Final    BOTTLES DRAWN AEROBIC AND ANAEROBIC Blood Culture results may not be optimal due to an inadequate volume of blood received in culture bottles   Culture   Final    NO GROWTH 5 DAYS Performed at [facility], [address] [place], [place] [address]    Report Status 03/24/2018 FINAL  Final  CULTURE, BLOOD (ROUTINE X 2) w Reflex to ID Panel     Status: None   Collection Time: 03/19/18  9:16 AM  Result Value Ref Range  Status   Specimen Description BLOOD BLOOD LEFT HAND  Final   Special Requests   Final    BOTTLES DRAWN AEROBIC AND ANAEROBIC Blood Culture adequate volume   Culture   Final    NO GROWTH 5 DAYS Performed at [facility], [address] [place], [place] [address]    Report Status 03/24/2018 FINAL  Final  MRSA PCR Screening     Status: None   Collection Time: 03/19/18  4:50 PM  Result Value Ref Range Status   MRSA by PCR NEGATIVE NEGATIVE Final    Comment:        The GeneXpert MRSA Assay (FDA approved for NASAL specimens only), is one component of a comprehensive MRSA colonization surveillance program. It is not intended to diagnose MRSA infection nor to guide or monitor treatment for MRSA infections. Performed at [facility], [address] [place], [place] [address]    Studies/Results: Dg Chest 1 View  Result Date: 03/26/2018 CLINICAL DATA:  Leukocytosis EXAM: CHEST  1 VIEW COMPARISON:  Yesterday FINDINGS: Gastric suction tube at least reaches the stomach. Right upper extremity PICC with tip at the upper cavoatrial junction. Low volume chest with streaky opacities over the diaphragm. No  edema. No visible pleural effusion, although small volume pleural fluid was seen on abdominal CT 03/23/2018. No pneumothorax. Partially seen stomach shows gaseous distension. IMPRESSION: Very low lung volumes with atelectasis in both lower lobes, stable from yesterday. Superimposed infection is not excluded. Electronically Signed   By: Marnee Spring M.D.   On: 03/26/2018 10:15   Dg Chest 1 View  Result Date: 03/25/2018 CLINICAL DATA:  43 y/o female pt w abd pain and leukocytosis. Hx of GERD. Smoker. EXAM: CHEST  1 VIEW COMPARISON:  03/19/2018 FINDINGS: Nasogastric tube has been placed, tip beyond the gastroesophageal junction off the image. A RIGHT-sided PICC line tip overlies the level of the LOWER superior vena cava. Persistent bibasilar opacities are probably not changed. No  pulmonary edema. IMPRESSION: 1. Interval placement of nasogastric tube. 2. Persistent bibasilar opacities. Electronically Signed   By: Norva Pavlov M.D.   On: 03/25/2018 12:01   Dg Abd 1 View  Result Date: 03/26/2018 CLINICAL DATA:  Abdominal pain. EXAM: ABDOMEN - 1 VIEW COMPARISON:  Body CT 03/23/2018 FINDINGS: Interval development of abnormal gaseous distension of small bowel loops in the mid abdomen with maximum transverse diameter of 4.6 cm. The colonic bowel gas pattern is preserved. The diaphragms are excluded by collimation and therefore the evaluation for free gas is limited. Enteric catheter within the proximal stomach. IMPRESSION: Interval development of abnormal gas distended small bowel loops in the mid abdomen concerning for early/intermittent small bowel obstruction or ileus. Electronically Signed   By: Ted Mcalpine M.D.   On: 03/26/2018 13:41   Medications:  Scheduled Meds: . atorvastatin  40 mg Oral Daily  . chlorhexidine  15 mL Mouth Rinse BID  . fenofibrate  160 mg Oral Daily  . FLUoxetine  40 mg Oral Daily  . fluticasone  2 spray Each Nare BID  . insulin aspart  0-15 Units Subcutaneous Q4H  . mouth rinse  15 mL Mouth Rinse q12n4p  . nystatin  5 mL Oral QID  . pantoprazole  40 mg Oral Daily  . polyethylene glycol  17 g Oral Daily  . senna-docusate  2 tablet Oral BID  . sodium chloride flush  10-40 mL Intracatheter Q12H  . sodium chloride flush  3 mL Intravenous Q12H   Continuous Infusions: . Marland KitchenTPN (CLINIMIX-E) Adult 83 mL/hr at 03/26/18 0400  . Marland KitchenTPN (CLINIMIX-E) Adult    . sodium chloride Stopped (03/23/18 0627)  . sodium chloride 75 mL/hr at 03/26/18 0400  . Fat emulsion    . [START ON 03/27/2018] fluconazole (DIFLUCAN) IV    . fluconazole (DIFLUCAN) IV    . heparin 2,700 Units/hr (03/26/18 0514)  . piperacillin-tazobactam (ZOSYN)  IV 3.375 g (03/26/18 1417)   PRN Meds:.sodium chloride, acetaminophen **OR** acetaminophen, albuterol, hydrALAZINE, labetalol,  lidocaine, LORazepam, metoCLOPramide (REGLAN) injection, morphine injection, phenol, promethazine, sodium chloride flush   Assessment: Active Problems:   Acute pancreatitis   Distended abdomen    Plan: Given her abdominal x-ray findings today and her clinical exam, patient should get a surgery consult due to concerns for small bowel obstruction or ileus  Would recommend transfer to higher level of care given worsening pancreatitis, and development of pseudocysts reported on CT scan.  If pseudocyst gets larger patient forms a walled off collection, this can be drained via transgastric approach, or further evaluation can be done via EUS at tertiary care facility.  Would recommend ICU transfer in the meantime  Given increasing white count, I discussed meropenem with infectious disease who do not recommend  changing to meropenem and suggest adding antifungal coverage to the Zosyn instead.  Patient has been made n.p.o. at this time  Liver enzymes have been normal, therefore no evidence of biliary duct obstruction  Given that she has had recent CT scans, to avoid further radiation, if patient continues to worsen, MRCP can be considered instead. Plan above discussed with Dr. Nancy MarusMayo   LOS: 8 days   Melodie BouillonVarnita Petula Rotolo, MD 03/26/2018, 3:35 PM

## 2018-03-26 NOTE — Progress Notes (Signed)
ANTICOAGULATION CONSULT NOTE - Initial Consult  Pharmacy Consult for Heparin  Indication: DVT  Allergies  Allergen Reactions  . Precedex [Dexmedetomidine Hcl In Nacl] Other (See Comments)    Significant Bradycardia  . Cefpodoxime     Tolerates augmentin and Keflex   . Clarithromycin   . Doxycycline   . Ferrous Gluconate   . Lactose Intolerance (Gi)   . Levofloxacin   . Rizatriptan   . Sulfa Antibiotics   . Topiramate   . Venlafaxine   . Zofran [Ondansetron Hcl]     Patient Measurements: Height: 5\' 7"  (170.2 cm) Weight: 212 lb 15.4 oz (96.6 kg) IBW/kg (Calculated) : 61.6 Heparin Dosing Weight:  83 kg   Vital Signs: Temp: 98.5 F (36.9 C) (02/20 0402) Temp Source: Oral (02/20 0402) BP: 151/99 (02/20 0402) Pulse Rate: 88 (02/20 0402)  Labs: Recent Labs    03/23/18 1828 03/24/18 0152  03/25/18 0504 03/25/18 1346 03/25/18 2103 03/26/18 0311  HGB  --  8.8*  --  9.2*  --   --  9.7*  HCT  --  28.0*  --  29.6*  --   --  30.6*  PLT  --  346  --  392  --   --  442*  APTT 28  --   --   --   --   --   --   LABPROT 14.6  --   --   --   --   --   --   INR 1.15  --   --   --   --   --   --   HEPARINUNFRC  --  <0.10*   < > 0.21* 0.21* 0.53 0.28*  CREATININE  --  1.09*  --  1.07*  --   --  0.93   < > = values in this interval not displayed.    Estimated Creatinine Clearance: 92.1 mL/min (by C-G formula based on SCr of 0.93 mg/dL).   Medical History: Past Medical History:  Diagnosis Date  . Allergy   . Anxiety   . Depression   . GERD (gastroesophageal reflux disease)   . Hypertension   . IBS (irritable bowel syndrome)     Medications:  Medications Prior to Admission  Medication Sig Dispense Refill Last Dose  . albuterol (PROVENTIL HFA;VENTOLIN HFA) 108 (90 Base) MCG/ACT inhaler Inhale 2 puffs into the lungs every 6 (six) hours as needed for wheezing or shortness of breath. 1 Inhaler 2 prn at prn  . atorvastatin (LIPITOR) 40 MG tablet Take 40 mg by mouth daily.    03/17/2018 at 2000  . fenofibrate 160 MG tablet Take 160 mg by mouth daily.   03/17/2018 at 0800  . FLUoxetine (PROZAC) 40 MG capsule Take 40 mg by mouth daily.   03/17/2018 at 0800  . fluticasone (FLONASE) 50 MCG/ACT nasal spray Place 2 sprays into both nostrils 2 (two) times daily.   03/17/2018 at 2000  . furosemide (LASIX) 20 MG tablet Take 20 mg by mouth daily.    prn at prn  . ibuprofen (ADVIL,MOTRIN) 200 MG tablet Take 200 mg by mouth every 6 (six) hours as needed.   prn at prn  . mirtazapine (REMERON) 30 MG tablet Take 30 mg by mouth at bedtime.   03/17/2018 at 2000  . [EXPIRED] morphine (MSIR) 15 MG tablet Take 15 mg by mouth every 6 (six) hours as needed.   03/18/2018 at 0730  . [EXPIRED] nystatin (MYCOSTATIN/NYSTOP) powder Apply topically 2 (two)  times daily.    03/17/2018 at 2000  . omeprazole (PRILOSEC) 20 MG capsule Take 40 mg by mouth 2 (two) times daily before a meal.   03/17/2018 at 2000  . Pancrelipase, Lip-Prot-Amyl, 24000-76000 units CPEP Take 24,000-48,000 Units by mouth 3 (three) times daily before meals. Take 2 capsules by mouth three times daily with meals and 1 capsule by mouth with snacks.   03/17/2018 at 2000  . polyethylene glycol (MIRALAX / GLYCOLAX) packet Take 17 g by mouth 2 (two) times daily.   03/17/2018 at 2000  . pregabalin (LYRICA) 200 MG capsule Take 200 mg by mouth 3 (three) times daily.   03/17/2018 at 2000  . prochlorperazine (COMPAZINE) 10 MG tablet Take 10 mg by mouth every 6 (six) hours as needed.    prn at prn  . promethazine (PHENERGAN) 12.5 MG tablet Take 1 tablet (12.5 mg total) by mouth every 6 (six) hours as needed for nausea or vomiting. 30 tablet 0 prn at prn  . senna-docusate (SENOKOT-S) 8.6-50 MG tablet Take 2 tablets by mouth 2 (two) times daily. (Patient taking differently: Take 4 tablets by mouth at bedtime. ) 120 tablet 0 03/17/2018 at 2000  . clonazePAM (KLONOPIN) 1 MG tablet Take 1 mg by mouth 2 (two) times daily as needed for anxiety.    prn at prn     Assessment: 21 YOF admitted for pancreatitis. 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 She is on no chronic anticoagulation here or PTA. Baseline labs have been ordered but not yet resulted.  2/18 0200 heparin level <0.1. 2500 unit bolus and increase rate to 1700 units/hr. Recheck in 6 hours.  02/18 HL @0902 = <0.10. Will order 2500 unit bolus and increase drip to 2000 units/hr. F/u level in 6 hours. (Note that called lab at 1115 as results were still not in computer- they said they were rechecking it)  2/18: HL @ 2200 = 0.22 Will order Heparin 1250 units IV X 1 bolus and increase drip rate to 2150 units/hr.  Will recheck HL 6 hrs after rate change.   2/19 AM heparin level 0.21. 1200 unit bolus and increase rate to 2300 units/hr. Recheck in 6 hours.  Goal of Therapy:  Heparin level 0.3-0.7 units/ml Monitor platelets by anticoagulation protocol: Yes   Plan:  2/19 @ 1346 heparin level 0.21. Level remains subtherapeutic. Will order 1300 unit bolus and increase infusion to 2500 units/hr. Recheck HL in 6 hours. Continue to CBC daily while on heparin infusion.   2/19 @ 2100 :  HL = 0.53 Will continue this pt on current rate and recheck HL on 2/20 @ 0300.   2/20 AM heparin level 0.28. 1200 unit bolus and increase rate to 2700 units/hr. Recheck in 6 hours.  Erich Montane, PharmD Clinical Pharmacist 03/26/2018 4:47 AM

## 2018-03-26 NOTE — Consult Note (Signed)
Subjective:   CC: Ileus versus small bowel obstruction  HPI:  Terri Berry is a 43 y.o. female [facility]  Patient admitted for pancreatitis likely from increased triglyceride levels.  Has been managed by the medical team including GI and infectious diseases.  She will seem to be improving but then acute worsening noted past couple days.  With CT confirmed worsening pancreatitis.  On the same time plain film showed increased bowel dilation, concerning for developing small bowel obstruction versus ileus.  Surgery was consulted for the obstruction versus ileus.  Patient states that she usually is a person with daily bowel movements but cannot recall any bowel movement since being admitted over 8 days ago.  She does state that she is still passing flatus on a regular basis.   Past Medical History:  has a past medical history of Allergy, Anxiety, Depression, GERD (gastroesophageal reflux disease), Hypertension, and IBS (irritable bowel syndrome).  Past Surgical History:  Past Surgical History:  Procedure Laterality Date  . ABDOMINAL HYSTERECTOMY    . GALLBLADDER SURGERY      Family History: family history includes COPD in her father; Cancer in her paternal aunt; Diabetes in her father and paternal uncle; Heart disease in her father and paternal uncle; Kidney disease in her father.  Social History:  reports that she has been smoking cigarettes. She has been smoking about 0.50 packs per day. She has never used smokeless tobacco. She reports previous alcohol use. She reports previous drug use.  Current Medications:  Medications Prior to Admission  Medication Sig Dispense Refill  . albuterol (PROVENTIL HFA;VENTOLIN HFA) 108 (90 Base) MCG/ACT inhaler Inhale 2 puffs into the lungs every 6 (six) hours as needed for wheezing or shortness of breath. 1 Inhaler 2  . atorvastatin (LIPITOR) 40 MG tablet Take 40 mg by mouth daily.    . fenofibrate 160 MG tablet Take 160 mg by  mouth daily.    Marland Kitchen. FLUoxetine (PROZAC) 40 MG capsule Take 40 mg by mouth daily.    . fluticasone (FLONASE) 50 MCG/ACT nasal spray Place 2 sprays into both nostrils 2 (two) times daily.    . furosemide (LASIX) 20 MG tablet Take 20 mg by mouth daily.     Marland Kitchen. ibuprofen (ADVIL,MOTRIN) 200 MG tablet Take 200 mg by mouth every 6 (six) hours as needed.    . mirtazapine (REMERON) 30 MG tablet Take 30 mg by mouth at bedtime.    . [EXPIRED] morphine (MSIR) 15 MG tablet Take 15 mg by mouth every 6 (six) hours as needed.    . [EXPIRED] nystatin (MYCOSTATIN/NYSTOP) powder Apply topically 2 (two) times daily.     Marland Kitchen. omeprazole (PRILOSEC) 20 MG capsule Take 40 mg by mouth 2 (two) times daily before a meal.    . Pancrelipase, Lip-Prot-Amyl, 24000-76000 units CPEP Take 24,000-48,000 Units by mouth 3 (three) times daily before meals. Take 2 capsules by mouth three times daily with meals and 1 capsule by mouth with snacks.    . polyethylene glycol (MIRALAX / GLYCOLAX) packet Take 17 g by mouth 2 (two) times daily.    . pregabalin (LYRICA) 200 MG capsule Take 200 mg by mouth 3 (three) times daily.    . prochlorperazine (COMPAZINE) 10 MG tablet Take 10 mg by mouth every 6 (six) hours as needed.     . promethazine (PHENERGAN) 12.5 MG tablet Take 1 tablet (12.5 mg total) by mouth every 6 (six) hours as needed for nausea or vomiting.  30 tablet 0  . senna-docusate (SENOKOT-S) 8.6-50 MG tablet Take 2 tablets by mouth 2 (two) times daily. (Patient taking differently: Take 4 tablets by mouth at bedtime. ) 120 tablet 0  . clonazePAM (KLONOPIN) 1 MG tablet Take 1 mg by mouth 2 (two) times daily as needed for anxiety.       Allergies:  Allergies as of 03/18/2018 - Review Complete 03/18/2018  Allergen Reaction Noted  . Precedex [dexmedetomidine hcl in nacl] Other (See Comments) 02/09/2018  . Cefpodoxime  10/31/2017  . Clarithromycin  10/31/2017  . Doxycycline  10/31/2017  . Ferrous gluconate  10/31/2017  . Lactose intolerance  (gi)  12/02/2017  . Levofloxacin  10/31/2017  . Rizatriptan  10/31/2017  . Sulfa antibiotics  10/31/2017  . Topiramate  10/31/2017  . Venlafaxine  10/31/2017  . Zofran [ondansetron hcl]  10/31/2017    ROS:  General: Denies weight loss, weight gain, fatigue, fevers, chills, and night sweats. Eyes: Denies blurry vision, double vision, eye pain, itchy eyes, and tearing. Ears: Denies hearing loss, earache, and ringing in ears. Nose: Denies sinus pain, congestion, infections, runny nose, and nosebleeds. Mouth/throat: Denies  bleeding gums, and difficulty swallowing. Heart: Denies chest pain, palpitations, racing heart, irregular heartbeat, leg pain or swelling, and decreased activity tolerance. Respiratory: Denies wheezing, cough, and sputum. GI: Denies change in appetite, diarrhea, and blood in stool. GU: Denies difficulty urinating, pain with urinating, urgency, frequency, blood in urine. Musculoskeletal: Denies joint stiffness, pain, swelling, muscle weakness. Skin: Denies rash, itching, mass, tumors, sores, and boils Neurologic: Denies headache, fainting, dizziness, seizures, numbness, and tingling. Psychiatric: Denies depression, anxiety, difficulty sleeping, and memory loss. Endocrine: Denies heat or cold intolerance, and increased thirst or urination. Blood/lymph: Denies easy bruising, easy bruising, and swollen glands     Objective:     BP (!) 143/99   Pulse 84   Temp 97.6 F (36.4 C) (Oral)   Resp 20   Ht 5\' 7"  (1.702 m)   Wt 96.6 kg   SpO2 100%   BMI 33.35 kg/m   Constitutional :  alert, cooperative, appears stated age and no distress  Lymphatics/Throat:  no asymmetry, masses, or scars  Respiratory:  clear to auscultation bilaterally  Cardiovascular:  regular rate and rhythm  Gastrointestinal: Distended, but soft abdomen with tenderness in all 4 quadrants.  Most tender in the epigastric region..   Musculoskeletal: Steady gait and movement  Skin: Cool and moist   Psychiatric: Normal affect, non-agitated, not confused       LABS:  CMP Latest Ref Rng & Units 03/26/2018 03/26/2018 03/25/2018  Glucose 70 - 99 mg/dL - 003(B) 048(G)  BUN 6 - 20 mg/dL - 89(V) 20  Creatinine 0.44 - 1.00 mg/dL - 6.94 5.03(U)  Sodium 135 - 145 mmol/L - 131(L) 131(L)  Potassium 3.5 - 5.1 mmol/L 3.4(L) 3.3(L) 3.2(L)  Chloride 98 - 111 mmol/L - 91(L) 92(L)  CO2 22 - 32 mmol/L - 28 29  Calcium 8.9 - 10.3 mg/dL - 8.9 8.8(K)  Total Protein 6.5 - 8.1 g/dL - 7.0 6.7  Total Bilirubin 0.3 - 1.2 mg/dL - 0.7 0.4  Alkaline Phos 38 - 126 U/L - 80 78  AST 15 - 41 U/L - 24 24  ALT 0 - 44 U/L - 17 18   CBC Latest Ref Rng & Units 03/26/2018 03/26/2018 03/25/2018  WBC 4.0 - 10.5 K/uL 31.8(H) 30.0(H) 25.3(H)  Hemoglobin 12.0 - 15.0 g/dL 8.0(K) 3.4(J) 1.7(H)  Hematocrit 36.0 - 46.0 % 29.6(L) 30.6(L) 29.6(L)  Platelets 150 - 400 K/uL 461(H) 442(H) 392    RADS: CLINICAL DATA:  Abdominal pain.  EXAM: ABDOMEN - 1 VIEW  COMPARISON:  Body CT 03/23/2018  FINDINGS: Interval development of abnormal gaseous distension of small bowel loops in the mid abdomen with maximum transverse diameter of 4.6 cm. The colonic bowel gas pattern is preserved. The diaphragms are excluded by collimation and therefore the evaluation for free gas is limited.  Enteric catheter within the proximal stomach.  IMPRESSION: Interval development of abnormal gas distended small bowel loops in the mid abdomen concerning for early/intermittent small bowel obstruction or ileus.   Electronically Signed   By: Ted Mcalpine M.D.   On: 03/26/2018 13:41 Assessment:   Small bowel dilation likely secondary to reactive ileus due to pancreatitis.  Plan:   NG tube in place with clear output in proper place.  Due to the lack of bowel movement since admission we will do a Dulcolax suppository x1 to see if any actual bowel movement which may lead to improvement in bowel dilation.  I would not recommend any  further intervention if this does not improve since she is still at a very labile state with her worsening pancreatitis.  Aggressive treatment of her pancreatitis will eventually lead to resolution of her likely ileus.  I also do agree with the GI services recommendation of transfer to tertiary care center ASAP due to this evolving pancreatitis that likely wires higher level care.  Continue supportive management until then per primary team.  Surgery will continue to follow.

## 2018-03-26 NOTE — Progress Notes (Signed)
PHARMACY - ADULT TOTAL PARENTERAL NUTRITION CONSULT NOTE   Pharmacy Consult for TPN Indication: Prolonged ileus and severe acute pancreatitis  Patient Measurements: Height: 5\' 7"  (170.2 cm) Weight: 212 lb 15.4 oz (96.6 kg) IBW/kg (Calculated) : 61.6 TPN AdjBW (KG): 70.4 Body mass index is 33.35 kg/m. Usual Weight:    Assessment:   GI: Clear liquids ordered 2/18 Endo:  Insulin requirements in the past 24 hours: 55 units (CBGs 2223-334 ) Lytes: K 3.3, Phos 2.7, Mag 1.8  Ca 8.9  Na 131 Renal:Scr 0.93 Pulm: Cards:  Hepatobil: 2/19:Trig 433 Neuro: UQ:JFHLK 2/13 >>  TPN Access: PICC placed 2/16 TPN start date: 2/16 Nutritional Goals (per RD recommendation on 2/17 ): Clinimix E 5/20 at 83 mL/hr over 24 hours + 20% ILE at 20 mL/hr over 12 hours on Mon and Thur only.   Goal TPN rate is 83 ml/hr   Current Nutrition: NPO  Plan:  Per discussion with MD and dietician, willcontinue TPN to Clinimix E 5/15 at 83 mL/hr. Will order Lipids today and check Triglycerides tomorrow. Clear liquids ordered, when tolerate full liquids/low fqt diet plan to decrease TPN rate Add MVI, trace elements  to TPN Insulin - continue with 45 units in TPN - per discussion with Diabetes RN and will order Lantus 20 units x 1 dose again today, and... continue SSI- which is now at moderate coverage q4h.  *Watch for rate change or switch to Enteral feeds** IVF- NS @ 75 ml/hr Electrolytes - Will order KCL IV x4 bags- will recheck at 1800 tonight. Monitor TPN labs,   F/U in am  Bari Mantis PharmD Clinical Pharmacist 03/26/2018

## 2018-03-26 NOTE — Progress Notes (Addendum)
[employer] [facility] [place] [facility]                                                                                                                                                                                  Patient Demographics   Terri Berry, is a 42 y.o. female, DOB - May 03, 1975, QMV:[id number]  Admit date - 03/18/2018   Admitting Physician Milagros Loll, MD  Outpatient Primary MD for the patient is Thomes Dinning, MD   LOS - 8  Subjective: Despite improving throughout the day yesterday, patient has worsened today.  She is tachypneic and a little bit confused this morning.  She remains on 5 L O2 with good saturations.  She states that she feels short of breath.  She denies any cough.  Review of Systems:   CONSTITUTIONAL: No documented fever. No fatigue, weakness. No weight gain, no weight loss.  EYES: No blurry or double vision.  ENT: No tinnitus. No postnasal drip. No redness of the oropharynx.  RESPIRATORY: No cough, no wheeze, no hemoptysis. No dyspnea.  CARDIOVASCULAR: No chest pain. No orthopnea. No palpitations. No syncope.  GASTROINTESTINAL: No nausea, no vomiting or diarrhea. +abdominal pain. No melena or hematochezia.  GENITOURINARY: No dysuria or hematuria.  ENDOCRINE: No polyuria or nocturia. No heat or cold intolerance.  HEMATOLOGY: No anemia. No bruising. No bleeding.  INTEGUMENTARY: No rashes. No lesions.  MUSCULOSKELETAL: No arthritis. No swelling. No gout.  NEUROLOGIC: No numbness, tingling, or ataxia. No seizure-type activity. PSYCHIATRIC: No anxiety. No insomnia. No ADD.   Vitals:   Vitals:   03/26/18 1440 03/26/18 1445 03/26/18 1500 03/26/18 1515  BP: (!) 156/102 (!) 152/103 (!) 158/102 (!) 143/99  Pulse: 79 75 78 84  Resp:      Temp:      TempSrc:      SpO2: 100% 100%    Weight:      Height:        Wt Readings from Last 3 Encounters:  03/24/18 96.6 kg  03/05/18 95.5 kg  02/15/18 94.4 kg     Intake/Output Summary (Last 24  hours) at 03/26/2018 1724 Last data filed at 03/26/2018 1432 Gross per 24 hour  Intake 5834.4 ml  Output 1600 ml  Net 4234.4 ml    Physical Exam:   GENERAL: Sitting up in chair, appears uncomfortable HEENT: Atraumatic, normocephalic. Extraocular muscles are intact. Pupils equal and reactive to light. Sclerae anicteric. No conjunctival injection.  Mouth is dry.  Tongue is swollen with some cracking. NECK: Supple. There is no jugular venous distention. No bruits, no lymphadenopathy, no thyromegaly.  HEART: RRR, no murmurs clicks or gallops LUNGS: Decreased breath sounds bilaterally without any  accessory muscle usage. + Shallow breathing and tachypnea.  Nasal cannula in place. ABDOMEN: + Distention, + moderate tenderness to palpation throughout the entire abdomen, no guarding or rebound. +hypoactive bowel sounds EXTREMITIES: No evidence of any cyanosis, clubbing, or peripheral edema.  +2 pedal and radial pulses bilaterally.  NEUROLOGIC: The patient is alert, awake, and oriented x3 with no focal motor or sensory deficits appreciated bilaterally.  SKIN: Moist and warm with no rashes appreciated. +two ulcers present on left foot, one on the dorsal surface, and one on the plantar surface, no erythema or drainage. Psych: Not anxious, depressed  Antibiotics   Anti-infectives (From admission, onward)   Start     Dose/Rate Route Frequency Ordered Stop   03/27/18 1600  anidulafungin (ERAXIS) 100 mg in sodium chloride 0.9 % 100 mL IVPB  Status:  Discontinued     100 mg 78 mL/hr over 100 Minutes Intravenous Every 24 hours 03/26/18 1244 03/26/18 1250   03/27/18 1600  fluconazole (DIFLUCAN) IVPB 400 mg     400 mg 100 mL/hr over 120 Minutes Intravenous Every 24 hours 03/26/18 1250     03/26/18 1400  anidulafungin (ERAXIS) 200 mg in sodium chloride 0.9 % 200 mL IVPB  Status:  Discontinued     200 mg 78 mL/hr over 200 Minutes Intravenous  Once 03/26/18 1244 03/26/18 1250   03/26/18 1400  fluconazole  (DIFLUCAN) IVPB 800 mg     800 mg 100 mL/hr over 240 Minutes Intravenous  Once 03/26/18 1250     03/19/18 0845  piperacillin-tazobactam (ZOSYN) IVPB 3.375 g     3.375 g 12.5 mL/hr over 240 Minutes Intravenous Every 8 hours 03/19/18 0835        Medications   Scheduled Meds: . atorvastatin  40 mg Oral Daily  . chlorhexidine  15 mL Mouth Rinse BID  . fenofibrate  160 mg Oral Daily  . FLUoxetine  40 mg Oral Daily  . fluticasone  2 spray Each Nare BID  . insulin aspart  0-15 Units Subcutaneous Q4H  . mouth rinse  15 mL Mouth Rinse q12n4p  . nystatin  5 mL Oral QID  . pantoprazole  40 mg Oral Daily  . polyethylene glycol  17 g Oral Daily  . senna-docusate  2 tablet Oral BID  . sodium chloride flush  10-40 mL Intracatheter Q12H  . sodium chloride flush  3 mL Intravenous Q12H   Continuous Infusions: . Marland Kitchen.TPN (CLINIMIX-E) Adult 83 mL/hr at 03/26/18 0400  . Marland Kitchen.TPN (CLINIMIX-E) Adult    . sodium chloride Stopped (03/23/18 0627)  . sodium chloride 75 mL/hr at 03/26/18 0400  . Fat emulsion    . [START ON 03/27/2018] fluconazole (DIFLUCAN) IV    . fluconazole (DIFLUCAN) IV 800 mg (03/26/18 1632)  . heparin 2,700 Units/hr (03/26/18 1645)  . piperacillin-tazobactam (ZOSYN)  IV 3.375 g (03/26/18 1417)   PRN Meds:.sodium chloride, acetaminophen **OR** acetaminophen, albuterol, hydrALAZINE, labetalol, lidocaine, LORazepam, metoCLOPramide (REGLAN) injection, morphine injection, phenol, promethazine, sodium chloride flush   Data Review:   Micro Results Recent Results (from the past 240 hour(s))  CULTURE, BLOOD (ROUTINE X 2) w Reflex to ID Panel     Status: None   Collection Time: 03/19/18  9:16 AM  Result Value Ref Range Status   Specimen Description BLOOD Left thumb  Final   Special Requests   Final    BOTTLES DRAWN AEROBIC AND ANAEROBIC Blood Culture results may not be optimal due to an inadequate volume of blood received in culture bottles  Culture   Final    NO GROWTH 5 DAYS Performed  at [facility], [address] [place], [place] [address]    Report Status 03/24/2018 FINAL  Final  CULTURE, BLOOD (ROUTINE X 2) w Reflex to ID Panel     Status: None   Collection Time: 03/19/18  9:16 AM  Result Value Ref Range Status   Specimen Description BLOOD BLOOD LEFT HAND  Final   Special Requests   Final    BOTTLES DRAWN AEROBIC AND ANAEROBIC Blood Culture adequate volume   Culture   Final    NO GROWTH 5 DAYS Performed at [facility], [address] [place], [place] [address]    Report Status 03/24/2018 FINAL  Final  MRSA PCR Screening     Status: None   Collection Time: 03/19/18  4:50 PM  Result Value Ref Range Status   MRSA by PCR NEGATIVE NEGATIVE Final    Comment:        The GeneXpert MRSA Assay (FDA approved for NASAL specimens only), is one component of a comprehensive MRSA colonization surveillance program. It is not intended to diagnose MRSA infection nor to guide or monitor treatment for MRSA infections. Performed at [facility], [address] [place], [place] [address]     Radiology Reports Dg Chest 1 View  Result Date: 03/26/2018 CLINICAL DATA:  Leukocytosis EXAM: CHEST  1 VIEW COMPARISON:  Yesterday FINDINGS: Gastric suction tube at least reaches the stomach. Right upper extremity PICC with tip at the upper cavoatrial junction. Low volume chest with streaky opacities over the diaphragm. No edema. No visible pleural effusion, although small volume pleural fluid was seen on abdominal CT 03/23/2018. No pneumothorax. Partially seen stomach shows gaseous distension. IMPRESSION: Very low lung volumes with atelectasis in both lower lobes, stable from yesterday. Superimposed infection is not excluded. Electronically Signed   By: Marnee SpringJonathon  Watts M.D.   On: 03/26/2018 10:15   Dg Chest 1 View  Result Date: 03/25/2018 CLINICAL DATA:  43 y/o female pt w abd pain and leukocytosis. Hx of GERD. Smoker. EXAM: CHEST  1 VIEW COMPARISON:   03/19/2018 FINDINGS: Nasogastric tube has been placed, tip beyond the gastroesophageal junction off the image. A RIGHT-sided PICC line tip overlies the level of the LOWER superior vena cava. Persistent bibasilar opacities are probably not changed. No pulmonary edema. IMPRESSION: 1. Interval placement of nasogastric tube. 2. Persistent bibasilar opacities. Electronically Signed   By: Norva PavlovElizabeth  Brown M.D.   On: 03/25/2018 12:01   Dg Abd 1 View  Result Date: 03/26/2018 CLINICAL DATA:  Abdominal pain. EXAM: ABDOMEN - 1 VIEW COMPARISON:  Body CT 03/23/2018 FINDINGS: Interval development of abnormal gaseous distension of small bowel loops in the mid abdomen with maximum transverse diameter of 4.6 cm. The colonic bowel gas pattern is preserved. The diaphragms are excluded by collimation and therefore the evaluation for free gas is limited. Enteric catheter within the proximal stomach. IMPRESSION: Interval development of abnormal gas distended small bowel loops in the mid abdomen concerning for early/intermittent small bowel obstruction or ileus. Electronically Signed   By: Ted Mcalpineobrinka  Dimitrova M.D.   On: 03/26/2018 13:41   Dg Abd 1 View  Result Date: 03/23/2018 CLINICAL DATA:  Nasogastric tube placement. EXAM: ABDOMEN - 1 VIEW COMPARISON:  Radiographs of March 19, 2018. FINDINGS: Distal tip of nasogastric tube is seen in expected position of proximal stomach. Status post cholecystectomy. No abnormal bowel dilatation is noted. IMPRESSION: Distal tip of nasogastric tube seen in  expected position of proximal stomach. Electronically Signed   By: Lupita Raider, M.D.   On: 03/23/2018 15:00   Dg Abd 1 View  Result Date: 03/19/2018 CLINICAL DATA:  Distended abdomen. EXAM: ABDOMEN - 1 VIEW COMPARISON:  03/19/2018 FINDINGS: Mild gaseous distention of the colon. There is no small bowel dilation to suggest obstruction. Soft tissues are unremarkable.  No acute skeletal abnormality. IMPRESSION: 1. No evidence of bowel  obstruction.  No acute finding. 2. Mild gaseous distention of the colon. Electronically Signed   By: Amie Portland M.D.   On: 03/19/2018 16:37   Dg Abd 1 View  Result Date: 03/19/2018 CLINICAL DATA:  History of influenza.  Abdominal infection. EXAM: ABDOMEN - 1 VIEW COMPARISON:  CT abdomen pelvis 03/18/2018 FINDINGS: Bibasilar heterogeneous opacities. Contrast material within the urinary bladder. Gaseous distended loops of small bowel within the central abdomen. Gas within the colon. No free intraperitoneal air. IMPRESSION: Findings suggestive of ileus. Electronically Signed   By: Annia Belt M.D.   On: 03/19/2018 09:10   Ct Chest Wo Contrast  Result Date: 03/19/2018 CLINICAL DATA:  43 y/o F; acute respiratory failure in the setting of acute pancreatitis. EXAM: CT CHEST WITHOUT CONTRAST TECHNIQUE: Multidetector CT imaging of the chest was performed following the standard protocol without IV contrast. COMPARISON:  03/19/2018 chest radiograph. 03/18/2018 CT abdomen and pelvis. FINDINGS: Cardiovascular: No significant vascular findings. Normal heart size. No pericardial effusion. Mediastinum/Nodes: No enlarged mediastinal or axillary lymph nodes. Thyroid gland, trachea, and esophagus demonstrate no significant findings. Lungs/Pleura: Low lung volumes. Small left pleural effusion. Bibasilar consolidations. No findings of pulmonary edema. No pneumothorax. Upper Abdomen: Partially visualized edema within the upper abdomen. Musculoskeletal: No fracture is seen. IMPRESSION: 1. Progressed small left pleural effusion, low lung volumes, and bibasilar consolidations which probably represent atelectasis, less likely pneumonia. Findings atypical for ARDS. 2. Stable partially visualized edema within the upper abdomen. Electronically Signed   By: Mitzi Hansen M.D.   On: 03/19/2018 16:23   Ct Abdomen W Contrast  Result Date: 03/23/2018 CLINICAL DATA:  Follow-up pancreatitis EXAM: CT ABDOMEN WITH CONTRAST  TECHNIQUE: Multidetector CT imaging of the abdomen was performed using the standard protocol following bolus administration of intravenous contrast. CONTRAST:  OMNIPAQUE IOHEXOL 300 MG/ML  SOLN COMPARISON:  03/18/2018 FINDINGS: Lower chest: Bilateral pleural effusions and posterior lower lobe atelectasis is again noted. Compared with previous exam the aeration to both lung bases has worsened. Hepatobiliary: No focal liver abnormality identified at this time. Previous cholecystectomy. No biliary ductal dilatation. Pancreas: There is marked diffuse progressive edema involving the pancreas especially the uncinate process of the head and tail of pancreas, image 39/2 and image 48/2. Increased heterogeneity within the uncinate process and tail of pancreas is noted without frank necrosis. No main duct dilatation identified. Surrounding fat stranding and free fluid is again identified. Spleen: The spleen is prominent measuring 12.4 cm in cranial caudal dimension. No focal splenic lesion noted. Adrenals/Urinary Tract: Normal appearance of the adrenal glands. Small hypodensities within the upper pole of both kidneys is unchanged, too small to reliably characterize. No enhancing mass or hydronephrosis identified bilaterally. Stomach/Bowel: Nasogastric tube is in the body of stomach. The stomach is nondistended. There is no abnormal dilatation of the small or large bowel loops. Vascular/Lymphatic: Normal appearance of the abdominal aorta. No aneurysm. There is decrease mass effect and narrowing involving the superior mesenteric artery and portal venous confluence. Eccentric filling defect within the superior mesenteric vein just before the portal venous  confluence is identified compatible with thrombus, image 53/5. This appears nonocclusive. No additional suspicious filling defects identified. The splenic vein remains patent. Other: There is fluid and edema identified within the root of the mesentery, stable to increased  from previous exam. Fluid within the left upper quadrant of the abdomen extending along the splenic hilum and around the tail of pancreas is identified. There is new early encapsulation of this fluid collection with enhancing rim compatible with developing pseudocyst., image 75/5. Musculoskeletal: There are degenerative changes identified throughout the thoracic and lumbar spine. IMPRESSION: 1. Changes of acute pancreatitis are again noted. There is progressive edema/enlargement with heterogeneous enhancement of the uncinate process of pancreas and tail of pancreas. No frank necrosis identified. 2. Although there is decreased mass effect and narrowing of the portal venous confluence and distal superior mesenteric vein there is a new eccentric filling defect within the superior mesenteric vein compatible with nonocclusive thrombus. 3. Left upper quadrant fluid with early capsular enhancement suggest developing pseudocyst. 4. Persistent bilateral pleural effusions with worsening aeration to both lung bases. Electronically Signed   By: Signa Kell M.D.   On: 03/23/2018 16:40   Ct Abdomen Pelvis W Contrast  Result Date: 03/18/2018 CLINICAL DATA:  Abdominal pain with vomiting. History of pancreatitis. EXAM: CT ABDOMEN AND PELVIS WITH CONTRAST TECHNIQUE: Multidetector CT imaging of the abdomen and pelvis was performed using the standard protocol following bolus administration of intravenous contrast. CONTRAST:  OMNIPAQUE IOHEXOL 300 MG/ML  SOLN COMPARISON:  Abdominopelvic CT 02/10/2018 and 02/09/2018. FINDINGS: Lower chest: Bibasilar atelectasis has mildly improved. There is no significant pleural or pericardial effusion. Hepatobiliary: The hepatic density is within normal limits and no focal lesion identified. No significant biliary dilatation post cholecystectomy. Pancreas: The pancreas remains diffusely enlarged, although appears homogeneous without evidence of necrosis or acute hemorrhage. There is  increasing peripancreatic inflammation and ill-defined fluid. No pancreatic ductal dilatation. Spleen: Normal in size without focal abnormality. Adrenals/Urinary Tract: Both adrenal glands appear normal. There are tiny cysts within the upper poles of both kidneys. No evidence of renal mass, urinary tract calculus or hydronephrosis. The bladder appears normal. Stomach/Bowel: No evidence of bowel wall thickening, distention or surrounding inflammatory change. The appendix appears normal. Moderate stool in the proximal colon. Vascular/Lymphatic: There are no enlarged abdominal or pelvic lymph nodes. Stable 12 mm portacaval node on image 35/2, likely reactive. The superior mesenteric and splenic veins are attenuated by the pancreatic inflammatory changes, although remain patent. No evidence of large vessel occlusion. Reproductive: Hysterectomy.  No evidence of adnexal mass. Other: Volume of pelvic ascites has not significantly increased. As above, there is slightly greater inflammatory change and ill-defined fluid surrounding the pancreas and extending into both pericolic gutters. No focal fluid collections are identified. Musculoskeletal: No acute or significant osseous findings. Severe discogenic sclerosis at L4-5. IMPRESSION: 1. Slight progression of extensive peripancreatic inflammatory changes with ill-defined fluid. No evidence of pancreatic hemorrhage, necrosis or focal fluid collection. 2. Attenuation of the splenic and superior mesenteric veins without evidence of large vessel occlusion. 3. Improved bibasilar atelectasis. Electronically Signed   By: Carey Bullocks M.D.   On: 03/18/2018 12:59   Dg Chest Port 1 View  Result Date: 03/19/2018 CLINICAL DATA:  Shortness of breath EXAM: PORTABLE CHEST 1 VIEW COMPARISON:  02/10/2018 FINDINGS: Low volume chest with streaky densities at the bases correlating with atelectasis by abdominal CT yesterday. No edema, effusion, or pneumothorax. Normal heart size and  mediastinal contours. IMPRESSION: Low volume chest with bilateral lower  lobe atelectasis. Electronically Signed   By: Marnee Spring M.D.   On: 03/19/2018 09:10   Dg Abd Portable 1v  Result Date: 03/19/2018 CLINICAL DATA:  Post NG tube placement EXAM: PORTABLE ABDOMEN - 1 VIEW COMPARISON:  03/19/2018 FINDINGS: Interval placement of nasogastric tube which decompresses the stomach. Nasogastric tube tip overlies the level of the mid stomach. Persistent gaseous distension of bowel loops noted. IMPRESSION: NG tube tip is in the stomach. Electronically Signed   By: Norva Pavlov M.D.   On: 03/19/2018 17:53   [place] Ekg Site Rite  Result Date: 03/22/2018 If Site Rite image not attached, placement could not be confirmed due to current cardiac rhythm.    CBC Recent Labs  Lab 03/23/18 0600 03/24/18 0152 03/25/18 0504 03/26/18 0311 03/26/18 1122  WBC 22.2* 22.9* 25.3* 30.0* 31.8*  HGB 8.7* 8.8* 9.2* 9.7* 9.3*  HCT 27.7* 28.0* 29.6* 30.6* 29.6*  PLT 320 346 392 442* 461*  MCV 82.2 82.4 82.2 81.8 81.5  MCH 25.8* 25.9* 25.6* 25.9* 25.6*  MCHC 31.4 31.4 31.1 31.7 31.4  RDW 14.7 14.6 14.7 14.8 14.8  LYMPHSABS 1.3  --   --   --  2.4  MONOABS 1.9*  --   --   --  1.8*  EOSABS 0.2  --   --   --  0.5  BASOSABS 0.1  --   --   --  0.2*    Chemistries  Recent Labs  Lab 03/20/18 0357 03/21/18 0614 03/22/18 0353 03/23/18 0600 03/24/18 0152 03/25/18 0504 03/26/18 0311  NA 136 134* 136 135 134* 131* 131*  K 4.5 3.9 3.6 3.1* 3.1* 3.2* 3.3*  CL 102 99 97* 93* 96* 92* 91*  CO2 22 26 29  33* 30 29 28   GLUCOSE 173* 188* 168* 274* 352* 352* 245*  BUN 51* 51* 35* 26* 21* 20 21*  CREATININE 3.23* 2.46* 1.65* 1.23* 1.09* 1.07* 0.93  CALCIUM 8.4* 8.3* 8.4* 8.6* 8.3* 8.7* 8.9  MG  --  1.9  --  1.9 1.9 1.8 1.8  AST 69* 34  --  22  --  24 24  ALT 57* 35  --  22  --  18 17  ALKPHOS 59 67  --  73  --  78 80  BILITOT 1.3* 0.9  --  0.6  --  0.4 0.7    ------------------------------------------------------------------------------------------------------------------ estimated creatinine clearance is 92.1 mL/min (by C-G formula based on SCr of 0.93 mg/dL). ------------------------------------------------------------------------------------------------------------------ No results for input(s): HGBA1C in the last 72 hours. ------------------------------------------------------------------------------------------------------------------ Recent Labs    03/25/18 0504 03/26/18 0311  TRIG 486* 433*   ------------------------------------------------------------------------------------------------------------------ No results for input(s): TSH, T4TOTAL, T3FREE, THYROIDAB in the last 72 hours.  Invalid input(s): FREET3 ------------------------------------------------------------------------------------------------------------------ No results for input(s): VITAMINB12, FOLATE, FERRITIN, TIBC, IRON, RETICCTPCT in the last 72 hours.  Coagulation profile Recent Labs  Lab 03/23/18 1828  INR 1.15    No results for input(s): DDIMER in the last 72 hours.  Cardiac Enzymes Recent Labs  Lab 03/19/18 2040  TROPONINI 0.06*   ------------------------------------------------------------------------------------------------------------------ Invalid input(s): POCBNP    Assessment & Plan   Acute hypoxic respiratory failure- likely due to gastric distention from pancreatitis with atelectasis.  Patient with worsening respiratory status today, taking shallow breaths and tachypneic. -Repeat CXR today -Wean oxygen as able -Incentive spirometry -Will try to transfer to stepdown unit for closer management, currently no beds available  Severe acute on chronic pancreatitis- likely due to hypertriglyceridemia. TGs decreasing, now below 500.  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 Wyn Quaker (vascular) who recommended anticoagulation, as this can precipitate significant mesenteric venous thrombosis and eventually bowel ischemia -Continue heparin drip  Worsening leukocytosis- unclear etiology, concern for infected pancreatic pseudocyst -Check differential, procalcitonin, chest x-ray -ID following-have added Diflucan in addition to Zosyn  Ileus/early SBO- have been treating patient for possible ileus with NG tube.  Abdominal x-ray this morning with possible early SBO. -Change NG tube back to suction -Strict n.p.o. -TPN -Will obtain surgery consult today  Acute renal failure- creatinine almost back to baseline -Nephrology signed off -Holding home Lasix  Hypertension- BPs well-controlled -Monitor  Chronic pain syndrome -Continue IV morphine   DVT prophylaxis- lovenox  Agree that patient would benefit from transfer to tertiary care center.  She remains on wait lists with all hospitals full.     Code Status Orders  (From admission, onward)         Start     Ordered   03/18/18 1407  Full code  Continuous     03/18/18 1409        Code Status History    Date Active Date Inactive Code Status Order ID Comments User Context   02/09/2018 0921 02/15/2018 1549 Full Code [id number]  Barbaraann Rondo, MD Inpatient   11/28/2017 0352 12/04/2017 2009 Full Code [id number]  Barbaraann Rondo, MD Inpatient      Consults  Nephrology, gastroenterology, ID, surgery  DVT Prophylaxis lovenox  Lab Results  Component Value Date   PLT 461 (H) 03/26/2018    Time Spent in minutes   45 minutes  Greater than 50% of time spent in care coordination and counseling patient regarding the condition and plan of care.   Jinny Blossom Zyren Sevigny M.D on  03/26/2018 at 5:24 PM  Between 7am to 6pm - Pager [phone]  After 6pm go to [email or web address] - Social research officer, government  [employer]   Office  [phone]

## 2018-03-26 NOTE — Progress Notes (Signed)
ANTICOAGULATION CONSULT NOTE   Pharmacy Consult for Heparin  Indication: DVT  Allergies  Allergen Reactions  . Precedex [Dexmedetomidine Hcl In Nacl] Other (See Comments)    Significant Bradycardia  . Cefpodoxime     Tolerates augmentin and Keflex   . Clarithromycin   . Doxycycline   . Ferrous Gluconate   . Lactose Intolerance (Gi)   . Levofloxacin   . Rizatriptan   . Sulfa Antibiotics   . Topiramate   . Venlafaxine   . Zofran [Ondansetron Hcl]     Patient Measurements: Height: 5\' 7"  (170.2 cm) Weight: 212 lb 15.4 oz (96.6 kg) IBW/kg (Calculated) : 61.6 Heparin Dosing Weight:  83 kg   Vital Signs: Temp: 98.6 F (37 C) (02/20 1129) Temp Source: Oral (02/20 1129) BP: 165/103 (02/20 1145) Pulse Rate: 81 (02/20 1129)  Labs: Recent Labs    03/23/18 1828  03/24/18 0152  03/25/18 0504  03/25/18 2103 03/26/18 0311 03/26/18 1122  HGB  --    < > 8.8*  --  9.2*  --   --  9.7* 9.3*  HCT  --    < > 28.0*  --  29.6*  --   --  30.6* 29.6*  PLT  --    < > 346  --  392  --   --  442* 461*  APTT 28  --   --   --   --   --   --   --   --   LABPROT 14.6  --   --   --   --   --   --   --   --   INR 1.15  --   --   --   --   --   --   --   --   HEPARINUNFRC  --   --  <0.10*   < > 0.21*   < > 0.53 0.28* 0.37  CREATININE  --   --  1.09*  --  1.07*  --   --  0.93  --    < > = values in this interval not displayed.    Estimated Creatinine Clearance: 92.1 mL/min (by C-G formula based on SCr of 0.93 mg/dL).   Medical History: Past Medical History:  Diagnosis Date  . Allergy   . Anxiety   . Depression   . GERD (gastroesophageal reflux disease)   . Hypertension   . IBS (irritable bowel syndrome)     Medications:  Medications Prior to Admission  Medication Sig Dispense Refill Last Dose  . albuterol (PROVENTIL HFA;VENTOLIN HFA) 108 (90 Base) MCG/ACT inhaler Inhale 2 puffs into the lungs every 6 (six) hours as needed for wheezing or shortness of breath. 1 Inhaler 2 prn at prn   . atorvastatin (LIPITOR) 40 MG tablet Take 40 mg by mouth daily.   03/17/2018 at 2000  . fenofibrate 160 MG tablet Take 160 mg by mouth daily.   03/17/2018 at 0800  . FLUoxetine (PROZAC) 40 MG capsule Take 40 mg by mouth daily.   03/17/2018 at 0800  . fluticasone (FLONASE) 50 MCG/ACT nasal spray Place 2 sprays into both nostrils 2 (two) times daily.   03/17/2018 at 2000  . furosemide (LASIX) 20 MG tablet Take 20 mg by mouth daily.    prn at prn  . ibuprofen (ADVIL,MOTRIN) 200 MG tablet Take 200 mg by mouth every 6 (six) hours as needed.   prn at prn  . mirtazapine (REMERON) 30 MG tablet  Take 30 mg by mouth at bedtime.   03/17/2018 at 2000  . [EXPIRED] morphine (MSIR) 15 MG tablet Take 15 mg by mouth every 6 (six) hours as needed.   03/18/2018 at 0730  . [EXPIRED] nystatin (MYCOSTATIN/NYSTOP) powder Apply topically 2 (two) times daily.    03/17/2018 at 2000  . omeprazole (PRILOSEC) 20 MG capsule Take 40 mg by mouth 2 (two) times daily before a meal.   03/17/2018 at 2000  . Pancrelipase, Lip-Prot-Amyl, 24000-76000 units CPEP Take 24,000-48,000 Units by mouth 3 (three) times daily before meals. Take 2 capsules by mouth three times daily with meals and 1 capsule by mouth with snacks.   03/17/2018 at 2000  . polyethylene glycol (MIRALAX / GLYCOLAX) packet Take 17 g by mouth 2 (two) times daily.   03/17/2018 at 2000  . pregabalin (LYRICA) 200 MG capsule Take 200 mg by mouth 3 (three) times daily.   03/17/2018 at 2000  . prochlorperazine (COMPAZINE) 10 MG tablet Take 10 mg by mouth every 6 (six) hours as needed.    prn at prn  . promethazine (PHENERGAN) 12.5 MG tablet Take 1 tablet (12.5 mg total) by mouth every 6 (six) hours as needed for nausea or vomiting. 30 tablet 0 prn at prn  . senna-docusate (SENOKOT-S) 8.6-50 MG tablet Take 2 tablets by mouth 2 (two) times daily. (Patient taking differently: Take 4 tablets by mouth at bedtime. ) 120 tablet 0 03/17/2018 at 2000  . clonazePAM (KLONOPIN) 1 MG tablet Take 1 mg  by mouth 2 (two) times daily as needed for anxiety.    prn at prn    Assessment: 36 YOF admitted for pancreatitis. 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 She is on no chronic anticoagulation here or PTA. Baseline labs have been ordered but not yet resulted.  2/18 0200 heparin level <0.1. 2500 unit bolus and increase rate to 1700 units/hr. Recheck in 6 hours.  02/18 HL @0902 = <0.10. Will order 2500 unit bolus and increase drip to 2000 units/hr. F/u level in 6 hours. (Note that called lab at 1115 as results were still not in computer- they said they were rechecking it)  2/18: HL @ 2200 = 0.22 Will order Heparin 1250 units IV X 1 bolus and increase drip rate to 2150 units/hr.  Will recheck HL 6 hrs after rate change.   2/19 AM heparin level 0.21. 1200 unit bolus and increase rate to 2300 units/hr. Recheck in 6 hours.  2/19 @ 1346 heparin level 0.21. Level remains subtherapeutic. Will order 1300 unit bolus and increase infusion to 2500 units/hr. Recheck HL in 6 hours. Continue to CBC daily while on heparin infusion.   2/19 @ 2100 :  HL = 0.53 Will continue this pt on current rate and recheck HL on 2/20 @ 0300.   2/20 AM heparin level 0.28. 1200 unit bolus and increase rate to 2700 units/hr. Recheck in 6 hours.  Goal of Therapy:  Heparin level 0.3-0.7 units/ml Monitor platelets by anticoagulation protocol: Yes   Plan:  2/20 HL@ 1122= 0.37. Will continue current rate and check confirmatory level in 6 hours.  Angelique Blonder, PharmD Clinical Pharmacist 03/26/2018 12:32 PM

## 2018-03-27 ENCOUNTER — Inpatient Hospital Stay: Payer: Medicaid Other

## 2018-03-27 LAB — COMPREHENSIVE METABOLIC PANEL
ALT: 22 U/L (ref 0–44)
ALT: 19 U/L (ref 0–44)
ANION GAP: 11 (ref 5–15)
AST: 23 U/L (ref 15–41)
AST: 20 U/L (ref 15–41)
Albumin: 2.5 g/dL — ABNORMAL LOW (ref 3.5–5.0)
Albumin: 2.6 g/dL — ABNORMAL LOW (ref 3.5–5.0)
Alkaline Phosphatase: 94 U/L (ref 38–126)
Alkaline Phosphatase: 94 U/L (ref 38–126)
Anion gap: 10 (ref 5–15)
BUN: 18 mg/dL (ref 6–20)
BUN: 18 mg/dL (ref 6–20)
CALCIUM: 8.6 mg/dL — AB (ref 8.9–10.3)
CO2: 26 mmol/L (ref 22–32)
CO2: 27 mmol/L (ref 22–32)
Calcium: 8.8 mg/dL — ABNORMAL LOW (ref 8.9–10.3)
Chloride: 94 mmol/L — ABNORMAL LOW (ref 98–111)
Chloride: 92 mmol/L — ABNORMAL LOW (ref 98–111)
Creatinine, Ser: 0.92 mg/dL (ref 0.44–1.00)
Creatinine, Ser: 0.94 mg/dL (ref 0.44–1.00)
GFR calc Af Amer: >60 mL/min (ref >60)
GFR calc non Af Amer: >60 mL/min (ref >60)
Glucose, Bld: 227 mg/dL — ABNORMAL HIGH (ref 70–99)
Glucose, Bld: 197 mg/dL — ABNORMAL HIGH (ref 70–99)
POTASSIUM: 3.6 mmol/L (ref 3.5–5.1)
Potassium: 4 mmol/L (ref 3.5–5.1)
Sodium: 130 mmol/L — ABNORMAL LOW (ref 135–145)
Sodium: 130 mmol/L — ABNORMAL LOW (ref 135–145)
TOTAL PROTEIN: 6.5 g/dL (ref 6.5–8.1)
Total Bilirubin: 0.4 mg/dL (ref 0.3–1.2)
Total Bilirubin: 0.5 mg/dL (ref 0.3–1.2)
Total Protein: 7 g/dL (ref 6.5–8.1)

## 2018-03-27 LAB — GLUCOSE, CAPILLARY
GLUCOSE-CAPILLARY: 194 mg/dL — AB (ref 70–99)
Glucose-Capillary: 199 mg/dL — ABNORMAL HIGH (ref 70–99)
Glucose-Capillary: 208 mg/dL — ABNORMAL HIGH (ref 70–99)
Glucose-Capillary: 209 mg/dL — ABNORMAL HIGH (ref 70–99)
Glucose-Capillary: 215 mg/dL — ABNORMAL HIGH (ref 70–99)
Glucose-Capillary: 286 mg/dL — ABNORMAL HIGH (ref 70–99)

## 2018-03-27 LAB — CBC
HCT: 27.8 % — ABNORMAL LOW (ref 36.0–46.0)
Hemoglobin: 9 g/dL — ABNORMAL LOW (ref 12.0–15.0)
MCH: 26.5 pg (ref 26.0–34.0)
MCHC: 32.4 g/dL (ref 30.0–36.0)
MCV: 82 fL (ref 80.0–100.0)
Platelets: 485 10*3/uL — ABNORMAL HIGH (ref 150–400)
RBC: 3.39 MIL/uL — ABNORMAL LOW (ref 3.87–5.11)
RDW: 14.9 % (ref 11.5–15.5)
WBC: 32.4 10*3/uL — ABNORMAL HIGH (ref 4.0–10.5)
nRBC: 0 % (ref 0.0–0.2)

## 2018-03-27 LAB — HEPARIN LEVEL (UNFRACTIONATED): Heparin Unfractionated: 0.49 IU/mL (ref 0.30–0.70)

## 2018-03-27 LAB — MAGNESIUM: Magnesium: 1.7 mg/dL (ref 1.7–2.4)

## 2018-03-27 LAB — TRIGLYCERIDES: Triglycerides: 402 mg/dL — ABNORMAL HIGH (ref <150)

## 2018-03-27 LAB — PHOSPHORUS: Phosphorus: 2.9 mg/dL (ref 2.5–4.6)

## 2018-03-27 MED ORDER — INSULIN ASPART 100 UNIT/ML ~~LOC~~ SOLN
0.0000 [IU] | SUBCUTANEOUS | Status: DC
  Administered 2018-03-27: 4 [IU] via SUBCUTANEOUS
  Filled 2018-03-27: qty 1

## 2018-03-27 MED ORDER — LORAZEPAM 2 MG/ML IJ SOLN: 1.0000 mg | INTRAMUSCULAR | 0 refills | Status: DC | PRN

## 2018-03-27 MED ORDER — METOCLOPRAMIDE HCL 5 MG/ML IJ SOLN: 10.0000 mg | Freq: Four times a day (QID) | INTRAMUSCULAR | 0 refills | Status: DC | PRN

## 2018-03-27 MED ORDER — SODIUM CHLORIDE 0.9 % IV SOLN: 1.0000 g | Freq: Three times a day (TID) | INTRAVENOUS | Status: DC

## 2018-03-27 MED ORDER — LABETALOL HCL 5 MG/ML IV SOLN: 10.0000 mg | INTRAVENOUS | 0 refills | Status: DC | PRN

## 2018-03-27 MED ORDER — PROMETHAZINE HCL 25 MG/ML IJ SOLN: 12.5000 mg | Freq: Four times a day (QID) | INTRAMUSCULAR | 0 refills | Status: DC | PRN

## 2018-03-27 MED ORDER — ACETAMINOPHEN 325 MG PO TABS: 650.0000 mg | ORAL_TABLET | Freq: Four times a day (QID) | ORAL | 0 refills | Status: DC | PRN

## 2018-03-27 MED ORDER — HYDRALAZINE HCL 20 MG/ML IJ SOLN: 10.0000 mg | INTRAMUSCULAR | 0 refills | Status: DC | PRN

## 2018-03-27 MED ORDER — SODIUM CHLORIDE 0.9 % IV SOLN
1.0000 g | Freq: Three times a day (TID) | INTRAVENOUS | Status: DC
  Filled 2018-03-27 (×2): qty 1

## 2018-03-27 MED ORDER — INSULIN GLARGINE 100 UNIT/ML ~~LOC~~ SOLN: 15.0000 [IU] | Freq: Every day | SUBCUTANEOUS | 0 refills | Status: DC

## 2018-03-27 MED ORDER — INSULIN ASPART 100 UNIT/ML ~~LOC~~ SOLN: 0.0000 [IU] | SUBCUTANEOUS | 0 refills | Status: DC

## 2018-03-27 MED ORDER — TRACE MINERALS CR-CU-MN-SE-ZN 10-1000-500-60 MCG/ML IV SOLN
INTRAVENOUS | Status: DC
  Filled 2018-03-27: qty 1992

## 2018-03-27 MED ORDER — HEPARIN (PORCINE) 25000 UT/250ML-% IV SOLN: 2700.0000 [IU]/h | INTRAVENOUS | 0 refills | Status: DC

## 2018-03-27 MED ORDER — LACTATED RINGERS IV SOLN: INTRAVENOUS | Status: DC

## 2018-03-27 MED ORDER — POTASSIUM CHLORIDE 10 MEQ/50ML IV SOLN
10.0000 meq | INTRAVENOUS | Status: AC
  Administered 2018-03-27 (×4): 10 meq via INTRAVENOUS
  Filled 2018-03-27 (×4): qty 50

## 2018-03-27 MED ORDER — LACTATED RINGERS IV SOLN
INTRAVENOUS | Status: DC
  Administered 2018-03-27 (×2): via INTRAVENOUS

## 2018-03-27 MED ORDER — MAGNESIUM SULFATE 2 GM/50ML IV SOLN
2.0000 g | Freq: Once | INTRAVENOUS | Status: AC
  Administered 2018-03-27: 2 g via INTRAVENOUS
  Filled 2018-03-27: qty 50

## 2018-03-27 MED ORDER — INSULIN GLARGINE 100 UNIT/ML ~~LOC~~ SOLN
15.0000 [IU] | Freq: Every day | SUBCUTANEOUS | Status: DC
  Administered 2018-03-27: 15 [IU] via SUBCUTANEOUS
  Filled 2018-03-27 (×2): qty 0.15

## 2018-03-27 MED ORDER — FLUCONAZOLE IN SODIUM CHLORIDE 400-0.9 MG/200ML-% IV SOLN: 400.0000 mg | INTRAVENOUS | 0 refills | Status: DC

## 2018-03-27 MED ORDER — MORPHINE SULFATE (PF) 2 MG/ML IV SOLN: 2.0000 mg | INTRAVENOUS | 0 refills | Status: DC | PRN

## 2018-03-27 NOTE — Progress Notes (Signed)
Date of Admission:  03/18/2018  Today 03/27/18     ID: Terri Berry is a 43 y.o. female  Active Problems:   Acute pancreatitis Ileus Leucocytosis pseudocyst    Subjective: Still has abdominal pain Back in ICU NPO NG tube to suction  Medications:  . atorvastatin  40 mg Oral Daily  . chlorhexidine  15 mL Mouth Rinse BID  . fenofibrate  160 mg Oral Daily  . FLUoxetine  40 mg Oral Daily  . fluticasone  2 spray Each Nare BID  . insulin aspart  0-20 Units Subcutaneous Q4H  . insulin glargine  15 Units Subcutaneous Daily  . mouth rinse  15 mL Mouth Rinse q12n4p  . nystatin  5 mL Oral QID  . pantoprazole  40 mg Oral Daily  . polyethylene glycol  17 g Oral Daily  . senna-docusate  2 tablet Oral BID  . sodium chloride flush  10-40 mL Intracatheter Q12H  . sodium chloride flush  3 mL Intravenous Q12H    Objective: Vital signs in last 24 hours: Temp:  [97.2 F (36.2 C)-100 F (37.8 C)] 98.4 F (36.9 C) (02/21 0838) Pulse Rate:  [71-88] 86 (02/21 1100) Resp:  [18-24] 20 (02/21 1100) BP: (134-173)/(81-107) 160/97 (02/21 1100) SpO2:  [99 %-100 %] 99 % (02/21 1100) Weight:  [99.3 kg] 99.3 kg (02/21 0443)  PHYSICAL EXAM:  General: lethargic due to pain meds Head: Normocephalic, without obvious abnormality, atraumatic. Eyes: Conjunctivae clear, anicteric sclerae.  ENT NG tube Lips, mucosa, and tongue normal. No Thrush Neck: Supple, symmetrical, no adenopathy, thyroid: non tender no carotid bruit and no JVD.  Lungs: Cb/l air entry- decreased bases Heart: Regular rate and rhythm, no murmur, rub or gallop. Abdomen: distended tight . Bowel sounds present  Extremities: atraumatic, no cyanosis. No edema. No clubbing Skin: No rashes or lesions. Or bruising Lymph: Cervical, supraclavicular normal. Neurologic: Grossly non-focal  Lab Results Recent Labs    03/26/18 0311 03/26/18 1122 03/26/18 1746 03/27/18 0437  WBC 30.0* 31.8*  --  32.4*  HGB 9.7* 9.3*  --  9.0*   HCT 30.6* 29.6*  --  27.8*  NA 131*  --   --  130*  K 3.3*  --  3.4* 3.6  CL 91*  --   --  94*  CO2 28  --   --  26  BUN 21*  --   --  18  CREATININE 0.93  --   --  0.92   Liver Panel Recent Labs    03/25/18 0504 03/26/18 0311 03/27/18 0437  PROT 6.7 7.0 6.5  ALBUMIN 2.4* 2.6* 2.5*  AST 24 24 23   ALT 18 17 22   ALKPHOS 78 80 94  BILITOT 0.4 0.7 0.4  BILIDIR 0.2  --   --   IBILI 0.2*  --   --    Sedimentation Rate No results for input(s): ESRSEDRATE in the last 72 hours. C-Reactive Protein No results for input(s): CRP in the last 72 hours.  Microbiology:  Studies/Results: Dg Chest 1 View  Result Date: 03/26/2018 CLINICAL DATA:  Leukocytosis EXAM: CHEST  1 VIEW COMPARISON:  Yesterday FINDINGS: Gastric suction tube at least reaches the stomach. Right upper extremity PICC with tip at the upper cavoatrial junction. Low volume chest with streaky opacities over the diaphragm. No edema. No visible pleural effusion, although small volume pleural fluid was seen on abdominal CT 03/23/2018. No pneumothorax. Partially seen stomach shows gaseous distension. IMPRESSION: Very low lung volumes with atelectasis in both  lower lobes, stable from yesterday. Superimposed infection is not excluded. Electronically Signed   By: Marnee Spring M.D.   On: 03/26/2018 10:15   Dg Abd 1 View  Result Date: 03/26/2018 CLINICAL DATA:  Abdominal pain. EXAM: ABDOMEN - 1 VIEW COMPARISON:  Body CT 03/23/2018 FINDINGS: Interval development of abnormal gaseous distension of small bowel loops in the mid abdomen with maximum transverse diameter of 4.6 cm. The colonic bowel gas pattern is preserved. The diaphragms are excluded by collimation and therefore the evaluation for free gas is limited. Enteric catheter within the proximal stomach. IMPRESSION: Interval development of abnormal gas distended small bowel loops in the mid abdomen concerning for early/intermittent small bowel obstruction or ileus. Electronically  Signed   By: Ted Mcalpine M.D.   On: 03/26/2018 13:41     Assessment/Plan: 43 year old female with history of acute on chronic pancreatitis admitted with abdominal pain and found to have fever high white count   Acute on chronic pancreatitis- CT abdomen from 2/17 showed 1) developing pseudocyst 2) Progressive edema of the uncinate process and tail of pancreas. No pancreatic necrosis 3) SMV non occlusive thrombus   Feverresolvedbut leucocytosis persist Started antifungal on 03/26/18- she will need repeat imaging to assess the size of the pseudocyst and whether it is infected.   She has been on Zosyn which adequately covers anerobes, enterocococi, GNR from GI tract but because of persisting increase in WBC will change to meropenem and see  Ileusrelated to the pancreatitis  AKI:has resolveddue to prerenal, ileus, ATN- contrast less likely  Discussed the management her nurse

## 2018-03-27 NOTE — Progress Notes (Signed)
Per [employer] [facility] do not hang bag of TPN.

## 2018-03-27 NOTE — Progress Notes (Signed)
[employer] - [place] [facility]                                                                                                                                                                                  Patient Demographics   Terri Berry, is a 43 y.o. female, DOB - 1975-04-13, WJX:[id number]  Admit date - 03/18/2018   Admitting Physician Milagros Loll, MD  Outpatient Primary MD for the patient is Thomes Dinning, MD   LOS - 9  Subjective: Seems to be doing little bit better today.  States that her abdominal pain is the same as yesterday.  She does feel like she is not breathing as fast today.  She continues to have some flatus.  She had 2 bowel movements yesterday evening.  Review of Systems:   CONSTITUTIONAL: No documented fever. No fatigue, weakness. No weight gain, no weight loss.  EYES: No blurry or double vision.  ENT: No tinnitus. No postnasal drip. No redness of the oropharynx.  RESPIRATORY: No cough, no wheeze, no hemoptysis. +dyspnea.  CARDIOVASCULAR: No chest pain. No orthopnea. No palpitations. No syncope.  GASTROINTESTINAL: No nausea, no vomiting or diarrhea. +abdominal pain. No melena or hematochezia.  GENITOURINARY: No dysuria or hematuria.  ENDOCRINE: No polyuria or nocturia. No heat or cold intolerance.  HEMATOLOGY: No anemia. No bruising. No bleeding.  INTEGUMENTARY: No rashes. No lesions.  MUSCULOSKELETAL: No arthritis. No swelling. No gout.  NEUROLOGIC: No numbness, tingling, or ataxia. No seizure-type activity. PSYCHIATRIC: No anxiety. No insomnia. No ADD.   Vitals:   Vitals:   03/27/18 0443 03/27/18 0800 03/27/18 0838 03/27/18 1100  BP: (!) 149/89  138/88 (!) 160/97  Pulse: 78  78 86  Resp: (!) 22  18 20   Temp: 99.5 F (37.5 C)  98.4 F (36.9 C)   TempSrc:   Oral   SpO2: 100% 99% 99% 99%  Weight: 99.3 kg     Height:        Wt Readings from Last 3 Encounters:  03/27/18 99.3 kg  03/05/18 95.5 kg  02/15/18 94.4 kg      Intake/Output Summary (Last 24 hours) at 03/27/2018 1215 Last data filed at 03/27/2018 1011 Gross per 24 hour  Intake 3516.1 ml  Output 4825 ml  Net -1308.9 ml    Physical Exam:   GENERAL: Laying in bed, appears mildly uncomfortable. HEENT: Atraumatic, normocephalic. Extraocular muscles are intact. Pupils equal and reactive to light. Sclerae anicteric. No conjunctival injection.  Mouth is dry.  Tongue is swollen with some cracking. NECK: Supple. There is no jugular venous distention. No bruits, no lymphadenopathy, no thyromegaly.  HEART: RRR, no murmurs clicks or gallops LUNGS:  Decreased breath sounds bilaterally without any accessory muscle usage. + Shallow breathing, no tachypnea.  Nasal cannula in place. ABDOMEN: + Distention, + moderate tenderness to palpation throughout the entire abdomen, no guarding or rebound. +hypoactive bowel sounds EXTREMITIES: No evidence of any cyanosis, clubbing, or peripheral edema.  +2 pedal and radial pulses bilaterally.  NEUROLOGIC: The patient is alert, awake, and oriented x3 with no focal motor or sensory deficits appreciated bilaterally.  SKIN: Moist and warm with no rashes appreciated. +two ulcers present on left foot, one on the dorsal surface, and one on the plantar surface, no erythema or drainage. Psych: Not anxious, depressed  Antibiotics   Anti-infectives (From admission, onward)   Start     Dose/Rate Route Frequency Ordered Stop   03/27/18 1600  anidulafungin (ERAXIS) 100 mg in sodium chloride 0.9 % 100 mL IVPB  Status:  Discontinued     100 mg 78 mL/hr over 100 Minutes Intravenous Every 24 hours 03/26/18 1244 03/26/18 1250   03/27/18 1600  fluconazole (DIFLUCAN) IVPB 400 mg     400 mg 100 mL/hr over 120 Minutes Intravenous Every 24 hours 03/26/18 1250     03/26/18 1400  anidulafungin (ERAXIS) 200 mg in sodium chloride 0.9 % 200 mL IVPB  Status:  Discontinued     200 mg 78 mL/hr over 200 Minutes Intravenous  Once 03/26/18 1244  03/26/18 1250   03/26/18 1400  fluconazole (DIFLUCAN) IVPB 800 mg     800 mg 100 mL/hr over 240 Minutes Intravenous  Once 03/26/18 1250 03/26/18 2032   03/19/18 0845  piperacillin-tazobactam (ZOSYN) IVPB 3.375 g     3.375 g 12.5 mL/hr over 240 Minutes Intravenous Every 8 hours 03/19/18 0835        Medications   Scheduled Meds: . atorvastatin  40 mg Oral Daily  . chlorhexidine  15 mL Mouth Rinse BID  . fenofibrate  160 mg Oral Daily  . FLUoxetine  40 mg Oral Daily  . fluticasone  2 spray Each Nare BID  . insulin aspart  0-20 Units Subcutaneous Q4H  . insulin glargine  15 Units Subcutaneous Daily  . mouth rinse  15 mL Mouth Rinse q12n4p  . nystatin  5 mL Oral QID  . pantoprazole  40 mg Oral Daily  . polyethylene glycol  17 g Oral Daily  . senna-docusate  2 tablet Oral BID  . sodium chloride flush  10-40 mL Intracatheter Q12H  . sodium chloride flush  3 mL Intravenous Q12H   Continuous Infusions: . Marland KitchenTPN (CLINIMIX-E) Adult 83 mL/hr at 03/27/18 1007  . sodium chloride Stopped (03/23/18 0627)  . fluconazole (DIFLUCAN) IV    . heparin 2,700 Units/hr (03/27/18 0220)  . lactated ringers 200 mL/hr at 03/27/18 1007  . piperacillin-tazobactam (ZOSYN)  IV 3.375 g (03/27/18 0536)   PRN Meds:.sodium chloride, acetaminophen **OR** acetaminophen, albuterol, hydrALAZINE, labetalol, lidocaine, LORazepam, metoCLOPramide (REGLAN) injection, morphine injection, phenol, promethazine, sodium chloride flush   Data Review:   Micro Results Recent Results (from the past 240 hour(s))  CULTURE, BLOOD (ROUTINE X 2) w Reflex to ID Panel     Status: None   Collection Time: 03/19/18  9:16 AM  Result Value Ref Range Status   Specimen Description BLOOD Left thumb  Final   Special Requests   Final    BOTTLES DRAWN AEROBIC AND ANAEROBIC Blood Culture results may not be optimal due to an inadequate volume of blood received in culture bottles   Culture   Final    NO  GROWTH 5 DAYS Performed at [facility], [address] [place], [place] [address]    Report Status 03/24/2018 FINAL  Final  CULTURE, BLOOD (ROUTINE X 2) w Reflex to ID Panel     Status: None   Collection Time: 03/19/18  9:16 AM  Result Value Ref Range Status   Specimen Description BLOOD BLOOD LEFT HAND  Final   Special Requests   Final    BOTTLES DRAWN AEROBIC AND ANAEROBIC Blood Culture adequate volume   Culture   Final    NO GROWTH 5 DAYS Performed at [facility], [address] [place], [place] [address]    Report Status 03/24/2018 FINAL  Final  MRSA PCR Screening     Status: None   Collection Time: 03/19/18  4:50 PM  Result Value Ref Range Status   MRSA by PCR NEGATIVE NEGATIVE Final    Comment:        The GeneXpert MRSA Assay (FDA approved for NASAL specimens only), is one component of a comprehensive MRSA colonization surveillance program. It is not intended to diagnose MRSA infection nor to guide or monitor treatment for MRSA infections. Performed at [facility], [address] [place], [place] [address]     Radiology Reports Dg Chest 1 View  Result Date: 03/26/2018 CLINICAL DATA:  Leukocytosis EXAM: CHEST  1 VIEW COMPARISON:  Yesterday FINDINGS: Gastric suction tube at least reaches the stomach. Right upper extremity PICC with tip at the upper cavoatrial junction. Low volume chest with streaky opacities over the diaphragm. No edema. No visible pleural effusion, although small volume pleural fluid was seen on abdominal CT 03/23/2018. No pneumothorax. Partially seen stomach shows gaseous distension. IMPRESSION: Very low lung volumes with atelectasis in both lower lobes, stable from yesterday. Superimposed infection is not excluded. Electronically Signed   By: Marnee SpringJonathon  Watts M.D.   On: 03/26/2018 10:15   Dg Chest 1 View  Result Date: 03/25/2018 CLINICAL DATA:  43 y/o female pt w abd pain and leukocytosis. Hx of GERD. Smoker. EXAM: CHEST  1 VIEW COMPARISON:  03/19/2018  FINDINGS: Nasogastric tube has been placed, tip beyond the gastroesophageal junction off the image. A RIGHT-sided PICC line tip overlies the level of the LOWER superior vena cava. Persistent bibasilar opacities are probably not changed. No pulmonary edema. IMPRESSION: 1. Interval placement of nasogastric tube. 2. Persistent bibasilar opacities. Electronically Signed   By: Norva PavlovElizabeth  Brown M.D.   On: 03/25/2018 12:01   Dg Abd 1 View  Result Date: 03/26/2018 CLINICAL DATA:  Abdominal pain. EXAM: ABDOMEN - 1 VIEW COMPARISON:  Body CT 03/23/2018 FINDINGS: Interval development of abnormal gaseous distension of small bowel loops in the mid abdomen with maximum transverse diameter of 4.6 cm. The colonic bowel gas pattern is preserved. The diaphragms are excluded by collimation and therefore the evaluation for free gas is limited. Enteric catheter within the proximal stomach. IMPRESSION: Interval development of abnormal gas distended small bowel loops in the mid abdomen concerning for early/intermittent small bowel obstruction or ileus. Electronically Signed   By: Ted Mcalpineobrinka  Dimitrova M.D.   On: 03/26/2018 13:41   Dg Abd 1 View  Result Date: 03/23/2018 CLINICAL DATA:  Nasogastric tube placement. EXAM: ABDOMEN - 1 VIEW COMPARISON:  Radiographs of March 19, 2018. FINDINGS: Distal tip of nasogastric tube is seen in expected position of proximal stomach. Status post cholecystectomy. No abnormal bowel dilatation is noted. IMPRESSION: Distal tip of nasogastric tube seen in expected position of proximal stomach. Electronically Signed  By: Lupita Raider, M.D.   On: 03/23/2018 15:00   Dg Abd 1 View  Result Date: 03/19/2018 CLINICAL DATA:  Distended abdomen. EXAM: ABDOMEN - 1 VIEW COMPARISON:  03/19/2018 FINDINGS: Mild gaseous distention of the colon. There is no small bowel dilation to suggest obstruction. Soft tissues are unremarkable.  No acute skeletal abnormality. IMPRESSION: 1. No evidence of bowel obstruction.   No acute finding. 2. Mild gaseous distention of the colon. Electronically Signed   By: Amie Portland M.D.   On: 03/19/2018 16:37   Dg Abd 1 View  Result Date: 03/19/2018 CLINICAL DATA:  History of influenza.  Abdominal infection. EXAM: ABDOMEN - 1 VIEW COMPARISON:  CT abdomen pelvis 03/18/2018 FINDINGS: Bibasilar heterogeneous opacities. Contrast material within the urinary bladder. Gaseous distended loops of small bowel within the central abdomen. Gas within the colon. No free intraperitoneal air. IMPRESSION: Findings suggestive of ileus. Electronically Signed   By: Annia Belt M.D.   On: 03/19/2018 09:10   Ct Chest Wo Contrast  Result Date: 03/19/2018 CLINICAL DATA:  43 y/o F; acute respiratory failure in the setting of acute pancreatitis. EXAM: CT CHEST WITHOUT CONTRAST TECHNIQUE: Multidetector CT imaging of the chest was performed following the standard protocol without IV contrast. COMPARISON:  03/19/2018 chest radiograph. 03/18/2018 CT abdomen and pelvis. FINDINGS: Cardiovascular: No significant vascular findings. Normal heart size. No pericardial effusion. Mediastinum/Nodes: No enlarged mediastinal or axillary lymph nodes. Thyroid gland, trachea, and esophagus demonstrate no significant findings. Lungs/Pleura: Low lung volumes. Small left pleural effusion. Bibasilar consolidations. No findings of pulmonary edema. No pneumothorax. Upper Abdomen: Partially visualized edema within the upper abdomen. Musculoskeletal: No fracture is seen. IMPRESSION: 1. Progressed small left pleural effusion, low lung volumes, and bibasilar consolidations which probably represent atelectasis, less likely pneumonia. Findings atypical for ARDS. 2. Stable partially visualized edema within the upper abdomen. Electronically Signed   By: Mitzi Hansen M.D.   On: 03/19/2018 16:23   Ct Abdomen W Contrast  Result Date: 03/23/2018 CLINICAL DATA:  Follow-up pancreatitis EXAM: CT ABDOMEN WITH CONTRAST TECHNIQUE:  Multidetector CT imaging of the abdomen was performed using the standard protocol following bolus administration of intravenous contrast. CONTRAST:  OMNIPAQUE IOHEXOL 300 MG/ML  SOLN COMPARISON:  03/18/2018 FINDINGS: Lower chest: Bilateral pleural effusions and posterior lower lobe atelectasis is again noted. Compared with previous exam the aeration to both lung bases has worsened. Hepatobiliary: No focal liver abnormality identified at this time. Previous cholecystectomy. No biliary ductal dilatation. Pancreas: There is marked diffuse progressive edema involving the pancreas especially the uncinate process of the head and tail of pancreas, image 39/2 and image 48/2. Increased heterogeneity within the uncinate process and tail of pancreas is noted without frank necrosis. No main duct dilatation identified. Surrounding fat stranding and free fluid is again identified. Spleen: The spleen is prominent measuring 12.4 cm in cranial caudal dimension. No focal splenic lesion noted. Adrenals/Urinary Tract: Normal appearance of the adrenal glands. Small hypodensities within the upper pole of both kidneys is unchanged, too small to reliably characterize. No enhancing mass or hydronephrosis identified bilaterally. Stomach/Bowel: Nasogastric tube is in the body of stomach. The stomach is nondistended. There is no abnormal dilatation of the small or large bowel loops. Vascular/Lymphatic: Normal appearance of the abdominal aorta. No aneurysm. There is decrease mass effect and narrowing involving the superior mesenteric artery and portal venous confluence. Eccentric filling defect within the superior mesenteric vein just before the portal venous confluence is identified compatible with thrombus, image 53/5. This  appears nonocclusive. No additional suspicious filling defects identified. The splenic vein remains patent. Other: There is fluid and edema identified within the root of the mesentery, stable to increased from  previous exam. Fluid within the left upper quadrant of the abdomen extending along the splenic hilum and around the tail of pancreas is identified. There is new early encapsulation of this fluid collection with enhancing rim compatible with developing pseudocyst., image 75/5. Musculoskeletal: There are degenerative changes identified throughout the thoracic and lumbar spine. IMPRESSION: 1. Changes of acute pancreatitis are again noted. There is progressive edema/enlargement with heterogeneous enhancement of the uncinate process of pancreas and tail of pancreas. No frank necrosis identified. 2. Although there is decreased mass effect and narrowing of the portal venous confluence and distal superior mesenteric vein there is a new eccentric filling defect within the superior mesenteric vein compatible with nonocclusive thrombus. 3. Left upper quadrant fluid with early capsular enhancement suggest developing pseudocyst. 4. Persistent bilateral pleural effusions with worsening aeration to both lung bases. Electronically Signed   By: Signa Kellaylor  Stroud M.D.   On: 03/23/2018 16:40   Ct Abdomen Pelvis W Contrast  Result Date: 03/18/2018 CLINICAL DATA:  Abdominal pain with vomiting. History of pancreatitis. EXAM: CT ABDOMEN AND PELVIS WITH CONTRAST TECHNIQUE: Multidetector CT imaging of the abdomen and pelvis was performed using the standard protocol following bolus administration of intravenous contrast. CONTRAST:  100mL OMNIPAQUE IOHEXOL 300 MG/ML  SOLN COMPARISON:  Abdominopelvic CT 02/10/2018 and 02/09/2018. FINDINGS: Lower chest: Bibasilar atelectasis has mildly improved. There is no significant pleural or pericardial effusion. Hepatobiliary: The hepatic density is within normal limits and no focal lesion identified. No significant biliary dilatation post cholecystectomy. Pancreas: The pancreas remains diffusely enlarged, although appears homogeneous without evidence of necrosis or acute hemorrhage. There is increasing  peripancreatic inflammation and ill-defined fluid. No pancreatic ductal dilatation. Spleen: Normal in size without focal abnormality. Adrenals/Urinary Tract: Both adrenal glands appear normal. There are tiny cysts within the upper poles of both kidneys. No evidence of renal mass, urinary tract calculus or hydronephrosis. The bladder appears normal. Stomach/Bowel: No evidence of bowel wall thickening, distention or surrounding inflammatory change. The appendix appears normal. Moderate stool in the proximal colon. Vascular/Lymphatic: There are no enlarged abdominal or pelvic lymph nodes. Stable 12 mm portacaval node on image 35/2, likely reactive. The superior mesenteric and splenic veins are attenuated by the pancreatic inflammatory changes, although remain patent. No evidence of large vessel occlusion. Reproductive: Hysterectomy.  No evidence of adnexal mass. Other: Volume of pelvic ascites has not significantly increased. As above, there is slightly greater inflammatory change and ill-defined fluid surrounding the pancreas and extending into both pericolic gutters. No focal fluid collections are identified. Musculoskeletal: No acute or significant osseous findings. Severe discogenic sclerosis at L4-5. IMPRESSION: 1. Slight progression of extensive peripancreatic inflammatory changes with ill-defined fluid. No evidence of pancreatic hemorrhage, necrosis or focal fluid collection. 2. Attenuation of the splenic and superior mesenteric veins without evidence of large vessel occlusion. 3. Improved bibasilar atelectasis. Electronically Signed   By: Carey BullocksWilliam  Veazey M.D.   On: 03/18/2018 12:59   Dg Chest Port 1 View  Result Date: 03/19/2018 CLINICAL DATA:  Shortness of breath EXAM: PORTABLE CHEST 1 VIEW COMPARISON:  02/10/2018 FINDINGS: Low volume chest with streaky densities at the bases correlating with atelectasis by abdominal CT yesterday. No edema, effusion, or pneumothorax. Normal heart size and mediastinal  contours. IMPRESSION: Low volume chest with bilateral lower lobe atelectasis. Electronically Signed   By: Marja KaysJonathon  Watts M.D.   On: 03/19/2018 09:10   Dg Abd Portable 1v  Result Date: 03/19/2018 CLINICAL DATA:  Post NG tube placement EXAM: PORTABLE ABDOMEN - 1 VIEW COMPARISON:  03/19/2018 FINDINGS: Interval placement of nasogastric tube which decompresses the stomach. Nasogastric tube tip overlies the level of the mid stomach. Persistent gaseous distension of bowel loops noted. IMPRESSION: NG tube tip is in the stomach. Electronically Signed   By: Norva Pavlov M.D.   On: 03/19/2018 17:53   [place] Ekg Site Rite  Result Date: 03/22/2018 If Site Rite image not attached, placement could not be confirmed due to current cardiac rhythm.    CBC Recent Labs  Lab 03/23/18 0600 03/24/18 0152 03/25/18 0504 03/26/18 0311 03/26/18 1122 03/27/18 0437  WBC 22.2* 22.9* 25.3* 30.0* 31.8* 32.4*  HGB 8.7* 8.8* 9.2* 9.7* 9.3* 9.0*  HCT 27.7* 28.0* 29.6* 30.6* 29.6* 27.8*  PLT 320 346 392 442* 461* 485*  MCV 82.2 82.4 82.2 81.8 81.5 82.0  MCH 25.8* 25.9* 25.6* 25.9* 25.6* 26.5  MCHC 31.4 31.4 31.1 31.7 31.4 32.4  RDW 14.7 14.6 14.7 14.8 14.8 14.9  LYMPHSABS 1.3  --   --   --  2.4  --   MONOABS 1.9*  --   --   --  1.8*  --   EOSABS 0.2  --   --   --  0.5  --   BASOSABS 0.1  --   --   --  0.2*  --     Chemistries  Recent Labs  Lab 03/21/18 0614  03/23/18 0600 03/24/18 0152 03/25/18 0504 03/26/18 0311 03/26/18 1746 03/27/18 0437  NA 134*   < > 135 134* 131* 131*  --  130*  K 3.9   < > 3.1* 3.1* 3.2* 3.3* 3.4* 3.6  CL 99   < > 93* 96* 92* 91*  --  94*  CO2 26   < > 33* 30 29 28   --  26  GLUCOSE 188*   < > 274* 352* 352* 245*  --  227*  BUN 51*   < > 26* 21* 20 21*  --  18  CREATININE 2.46*   < > 1.23* 1.09* 1.07* 0.93  --  0.92  CALCIUM 8.3*   < > 8.6* 8.3* 8.7* 8.9  --  8.6*  MG 1.9  --  1.9 1.9 1.8 1.8  --  1.7  AST 34  --  22  --  24 24  --  23  ALT 35  --  22  --  18 17  --  22   ALKPHOS 67  --  73  --  78 80  --  94  BILITOT 0.9  --  0.6  --  0.4 0.7  --  0.4   < > = values in this interval not displayed.   ------------------------------------------------------------------------------------------------------------------ estimated creatinine clearance is 94.5 mL/min (by C-G formula based on SCr of 0.92 mg/dL). ------------------------------------------------------------------------------------------------------------------ No results for input(s): HGBA1C in the last 72 hours. ------------------------------------------------------------------------------------------------------------------ Recent Labs    03/26/18 0311 03/27/18 0437  TRIG 433* 402*   ------------------------------------------------------------------------------------------------------------------ No results for input(s): TSH, T4TOTAL, T3FREE, THYROIDAB in the last 72 hours.  Invalid input(s): FREET3 ------------------------------------------------------------------------------------------------------------------ No results for input(s): VITAMINB12, FOLATE, FERRITIN, TIBC, IRON, RETICCTPCT in the last 72 hours.  Coagulation profile Recent Labs  Lab 03/23/18 1828  INR 1.15    No results for input(s): DDIMER in the last 72 hours.  Cardiac Enzymes No results for input(s): CKMB,  TROPONINI, MYOGLOBIN in the last 168 hours.  Invalid input(s): CK ------------------------------------------------------------------------------------------------------------------ Invalid input(s): POCBNP    Assessment & Plan   Acute hypoxic respiratory failure- likely due to gastric distention from pancreatitis with atelectasis.  Patient continues to have shallow breathing today.  Remains on 5 L O2. -Wean oxygen as able -Incentive spirometry  Severe acute on chronic pancreatitis- likely due to hypertriglyceridemia. TGs decreasing, now 400. 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 Wyn Quaker (vascular) who recommended anticoagulation, as this can precipitate significant mesenteric venous thrombosis and eventually bowel ischemia -Continue heparin drip  Worsening leukocytosis- unclear etiology, concern for developing infected pancreatic pseudocyst.  Procalcitonin decreased from the last time it was checked 2/13. -ID following-have added Diflucan in addition to Zosyn  Ileus/early SBO- have been treating patient for possible ileus with NG tube.  Seen on abdominal x-ray 2/20. -Change NG tube back to suction -Strict n.p.o. -TPN -Surgery following- have added Dulcolax suppository  Hypertension- BPs well-controlled -Monitor  Chronic pain syndrome -Continue IV morphine   DVT prophylaxis- lovenox  Agree that patient would benefit from transfer to tertiary care center.  She remains on wait lists with all hospitals full.     Code Status Orders  (From admission, onward)         Start     Ordered   03/18/18 1407  Full code  Continuous     03/18/18 1409        Code Status History    Date Active Date Inactive Code Status Order ID Comments User Context   02/09/2018 0921 02/15/2018 1549 Full Code [id number]  Barbaraann Rondo, MD Inpatient   11/28/2017 0352 12/04/2017 2009 Full Code [id number]  Barbaraann Rondo, MD Inpatient      Consults  Nephrology, gastroenterology, ID, surgery  DVT Prophylaxis lovenox  Lab Results  Component Value Date   PLT 485 (H) 03/27/2018    Time Spent in minutes   45 minutes  Greater than 50% of time spent in care coordination and counseling patient regarding the condition and plan of care.   Jinny Blossom Daran Favaro M.D on 03/27/2018 at 12:15 PM  Between 7am to 6pm - Pager -  [phone]  After 6pm go to [email or web address] - Social research officer, government  [employer]   Office  [phone]

## 2018-03-27 NOTE — Progress Notes (Signed)
ANTICOAGULATION CONSULT NOTE   Pharmacy Consult for Heparin  Indication: DVT  Patient Measurements: Height: 5\' 7"  (170.2 cm) Weight: 218 lb 14.7 oz (99.3 kg) IBW/kg (Calculated) : 61.6 Heparin Dosing Weight:  83 kg   Vital Signs: Temp: 99.5 F (37.5 C) (02/21 0443) Temp Source: Oral (02/21 0344) BP: 149/89 (02/21 0443) Pulse Rate: 78 (02/21 0443)  Labs: Recent Labs    03/25/18 0504  03/26/18 0311 03/26/18 1122 03/26/18 1746 03/27/18 0437  HGB 9.2*  --  9.7* 9.3*  --  9.0*  HCT 29.6*  --  30.6* 29.6*  --  27.8*  PLT 392  --  442* 461*  --  485*  HEPARINUNFRC 0.21*   < > 0.28* 0.37 0.40 0.49  CREATININE 1.07*  --  0.93  --   --  0.92   < > = values in this interval not displayed.    Estimated Creatinine Clearance: 94.5 mL/min (by C-G formula based on SCr of 0.92 mg/dL).   Medical History: Past Medical History:  Diagnosis Date  . Allergy   . Anxiety   . Depression   . GERD (gastroesophageal reflux disease)   . Hypertension   . IBS (irritable bowel syndrome)     Medications:  Medications Prior to Admission  Medication Sig Dispense Refill Last Dose  . albuterol (PROVENTIL HFA;VENTOLIN HFA) 108 (90 Base) MCG/ACT inhaler Inhale 2 puffs into the lungs every 6 (six) hours as needed for wheezing or shortness of breath. 1 Inhaler 2 prn at prn  . atorvastatin (LIPITOR) 40 MG tablet Take 40 mg by mouth daily.   03/17/2018 at 2000  . fenofibrate 160 MG tablet Take 160 mg by mouth daily.   03/17/2018 at 0800  . FLUoxetine (PROZAC) 40 MG capsule Take 40 mg by mouth daily.   03/17/2018 at 0800  . fluticasone (FLONASE) 50 MCG/ACT nasal spray Place 2 sprays into both nostrils 2 (two) times daily.   03/17/2018 at 2000  . furosemide (LASIX) 20 MG tablet Take 20 mg by mouth daily.    prn at prn  . ibuprofen (ADVIL,MOTRIN) 200 MG tablet Take 200 mg by mouth every 6 (six) hours as needed.   prn at prn  . mirtazapine (REMERON) 30 MG tablet Take 30 mg by mouth at bedtime.   03/17/2018 at  2000  . [EXPIRED] morphine (MSIR) 15 MG tablet Take 15 mg by mouth every 6 (six) hours as needed.   03/18/2018 at 0730  . [EXPIRED] nystatin (MYCOSTATIN/NYSTOP) powder Apply topically 2 (two) times daily.    03/17/2018 at 2000  . omeprazole (PRILOSEC) 20 MG capsule Take 40 mg by mouth 2 (two) times daily before a meal.   03/17/2018 at 2000  . Pancrelipase, Lip-Prot-Amyl, 24000-76000 units CPEP Take 24,000-48,000 Units by mouth 3 (three) times daily before meals. Take 2 capsules by mouth three times daily with meals and 1 capsule by mouth with snacks.   03/17/2018 at 2000  . polyethylene glycol (MIRALAX / GLYCOLAX) packet Take 17 g by mouth 2 (two) times daily.   03/17/2018 at 2000  . pregabalin (LYRICA) 200 MG capsule Take 200 mg by mouth 3 (three) times daily.   03/17/2018 at 2000  . prochlorperazine (COMPAZINE) 10 MG tablet Take 10 mg by mouth every 6 (six) hours as needed.    prn at prn  . promethazine (PHENERGAN) 12.5 MG tablet Take 1 tablet (12.5 mg total) by mouth every 6 (six) hours as needed for nausea or vomiting. 30 tablet 0 prn  at prn  . senna-docusate (SENOKOT-S) 8.6-50 MG tablet Take 2 tablets by mouth 2 (two) times daily. (Patient taking differently: Take 4 tablets by mouth at bedtime. ) 120 tablet 0 03/17/2018 at 2000  . clonazePAM (KLONOPIN) 1 MG tablet Take 1 mg by mouth 2 (two) times daily as needed for anxiety.    prn at prn    Assessment: 16 YOF admitted for pancreatitis. 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 She is on no chronic anticoagulation here or PTA. Baseline labs have been ordered but not yet resulted. CBC stable  Heparin Course:  2/18 0200 HL <0.1. 2500 unit bolus and increase rate to 1700 units/hr. 2/18 0902 HL <0.10. Will order 2500 unit bolus then 2000 units/hr 2/18 2200 HL 0.22: 1250 unit bolus and inc drip rate to 2150 units/hr.  2/19 AM HL 0.21 1200 unit bolus and inc rate to 2300 units/hr.  2/19 1346 HL 0.21 1300 unit bolus and  increase to 2500 units/hr.  2/19 2100 HL 0.53 2/20 AM HL 0.28: 1200 unit bolus and increase to 2700 units/hr.  2/20 1122 HL 0.37 2/20 1746 HL 0.40  Goal of Therapy:  Heparin level 0.3-0.7 units/ml Monitor platelets by anticoagulation protocol: Yes   Plan:  This is the second consecutive therapeutic heparin level. Will continue current rate and check the next level with AM labs. CBC in am  2/21 AM heparin level 0.49. Continue current regimen. Recheck heparin level and CBC with tomorrow AM labs.  Erich Montane, PharmD Clinical Pharmacist 03/27/2018 6:39 AM

## 2018-03-27 NOTE — Progress Notes (Signed)
Inpatient Diabetes Program Recommendations  AACE/ADA: New Consensus Statement on Inpatient Glycemic Control (2015)  Target Ranges:  Prepandial:   less than 140 mg/dL      Peak postprandial:   less than 180 mg/dL (1-2 hours)      Critically ill patients:  140 - 180 mg/dL   Results for TASHIMA, MAGNUSSEN (MRN [id number]) as of 03/27/2018 10:00  Ref. Range 03/26/2018 00:01 03/26/2018 03:57 03/26/2018 07:51 03/26/2018 11:26 03/26/2018 16:37 03/26/2018 20:14  Glucose-Capillary Latest Ref Range: 70 - 99 mg/dL 037 (H)  5 units NOVOLOG  223 (H)  5 units NOVOLOG  269 (H)  8 units NOVOLOG  316 (H)  11 units NOVOLOG +  20 units LANTUS given at 2pm  280 (H)  8 units NOVOLOG  270 (H)  8 units NOVOLOG    Results for MALYAH, SARPY (MRN [id number]) as of 03/27/2018 10:00  Ref. Range 03/27/2018 00:11 03/27/2018 04:46 03/27/2018 07:51  Glucose-Capillary Latest Ref Range: 70 - 99 mg/dL 450 (H)  8 units NOVOLOG  209 (H)  5 units NOVOLOG  215 (H)  5 units NOVOLOG       Current Orders: Novolog Moderate Correction Scale/ SSI (0-15 units) Q4 hours        Getting TPN at 83cc/hour.  TPN contains 45 units Insulin.     MD- Note patient received Lantus 20 units X 1 dose the last 2 days (got 20 units Lantus on 02/19 and 20 units Lantus on 02/20).  Patient received a total of 45 units Novolog + 20 units Lantus yesterday (02/20).  Please consider adding Lantus 25 units Daily  May also consider increasing Novolog SSI to Resistant scale (0-20 units) Q4 hours  Need to be cautious and down titrate Insulins as TPN is reduced.     --Will follow patient during hospitalization--  Ambrose Finland RN, MSN, CDE Diabetes Coordinator Inpatient Glycemic Control Team Team Pager: [phone] (8a-5p)

## 2018-03-27 NOTE — Progress Notes (Signed)
Melodie Bouillon, MD [address], [place], [place], [address] [address], [place], [place], [address] Phone: [phone]  Fax: [phone]   Subjective: Patient now transferred to the ICU.  She is more awake and alert today.  Had a bowel movement today.  Continues on TPN.  Afebrile.   Objective: Exam: Vital signs in last 24 hours: Vitals:   03/27/18 1100 03/27/18 1200 03/27/18 1300 03/27/18 1400  BP: (!) 160/97 (!) 167/99 (!) 153/95 (!) 145/95  Pulse: 86 81 80 85  Resp: 20 17 (!) 21 18  Temp:      TempSrc:      SpO2: 99% 98% 99% 98%  Weight:      Height:       Weight change:   Intake/Output Summary (Last 24 hours) at 03/27/2018 1526 Last data filed at 03/27/2018 1435 Gross per 24 hour  Intake 3516.1 ml  Output 4025 ml  Net -508.9 ml    General: No acute distress, AAO x3 Abd: Soft, distended, no signs of acute abdomen, tender to palpation, No HSM Skin: Warm, no rashes Neck: Supple, Trachea midline   Lab Results: Lab Results  Component Value Date   WBC 32.4 (H) 03/27/2018   HGB 9.0 (L) 03/27/2018   HCT 27.8 (L) 03/27/2018   MCV 82.0 03/27/2018   PLT 485 (H) 03/27/2018   Micro Results: Recent Results (from the past 240 hour(s))  CULTURE, BLOOD (ROUTINE X 2) w Reflex to ID Panel     Status: None   Collection Time: 03/19/18  9:16 AM  Result Value Ref Range Status   Specimen Description BLOOD Left thumb  Final   Special Requests   Final    BOTTLES DRAWN AEROBIC AND ANAEROBIC Blood Culture results may not be optimal due to an inadequate volume of blood received in culture bottles   Culture   Final    NO GROWTH 5 DAYS Performed at [facility] [place], [address] [place], [place] [address]    Report Status 03/24/2018 FINAL  Final  CULTURE, BLOOD (ROUTINE X 2) w Reflex to ID Panel     Status: None   Collection Time: 03/19/18  9:16 AM  Result Value Ref Range Status   Specimen Description BLOOD BLOOD LEFT HAND  Final   Special Requests   Final    BOTTLES DRAWN AEROBIC AND ANAEROBIC Blood Culture adequate volume   Culture   Final    NO GROWTH 5 DAYS Performed at [facility], [address] [place], [place] [address]    Report Status 03/24/2018 FINAL  Final  MRSA PCR Screening     Status: None   Collection Time: 03/19/18  4:50 PM  Result Value Ref Range Status   MRSA by PCR NEGATIVE NEGATIVE Final    Comment:        The GeneXpert MRSA Assay (FDA approved for NASAL specimens only), is one component of a comprehensive MRSA colonization surveillance program. It is not intended to diagnose MRSA infection nor to guide or monitor treatment for MRSA infections. Performed at [facility], [address] [place], [place] [address]    Studies/Results: Dg Chest 1 View  Result Date: 03/26/2018 CLINICAL DATA:  Leukocytosis EXAM: CHEST  1 VIEW COMPARISON:  Yesterday FINDINGS: Gastric suction tube at least reaches the stomach. Right upper extremity PICC with tip at the upper cavoatrial junction. Low volume chest with streaky opacities over the diaphragm. No edema. No visible pleural effusion, although small volume  pleural fluid was seen on abdominal CT 03/23/2018. No pneumothorax. Partially seen stomach shows gaseous distension. IMPRESSION: Very low lung volumes with atelectasis in both lower lobes, stable from yesterday. Superimposed infection is not excluded. Electronically Signed   By: Marnee Spring M.D.   On: 03/26/2018 10:15   Dg Abd 1 View  Result Date: 03/26/2018 CLINICAL DATA:  Abdominal pain. EXAM: ABDOMEN - 1 VIEW COMPARISON:  Body CT 03/23/2018 FINDINGS: Interval development of abnormal gaseous distension of small bowel loops in the mid abdomen with maximum transverse diameter of 4.6 cm. The colonic bowel gas pattern is preserved. The diaphragms are excluded by collimation and therefore the evaluation for free gas is limited. Enteric catheter within the proximal stomach.  IMPRESSION: Interval development of abnormal gas distended small bowel loops in the mid abdomen concerning for early/intermittent small bowel obstruction or ileus. Electronically Signed   By: Ted Mcalpine M.D.   On: 03/26/2018 13:41   Medications:  Scheduled Meds: . atorvastatin  40 mg Oral Daily  . chlorhexidine  15 mL Mouth Rinse BID  . fenofibrate  160 mg Oral Daily  . FLUoxetine  40 mg Oral Daily  . fluticasone  2 spray Each Nare BID  . insulin aspart  0-20 Units Subcutaneous Q4H  . insulin glargine  15 Units Subcutaneous Daily  . mouth rinse  15 mL Mouth Rinse q12n4p  . nystatin  5 mL Oral QID  . pantoprazole  40 mg Oral Daily  . polyethylene glycol  17 g Oral Daily  . senna-docusate  2 tablet Oral BID  . sodium chloride flush  10-40 mL Intracatheter Q12H  . sodium chloride flush  3 mL Intravenous Q12H   Continuous Infusions: . Marland KitchenTPN (CLINIMIX-E) Adult 83 mL/hr at 03/27/18 1007  . Marland KitchenTPN (CLINIMIX-E) Adult    . sodium chloride Stopped (03/23/18 0627)  . fluconazole (DIFLUCAN) IV    . heparin 2,700 Units/hr (03/27/18 0220)  . [START ON 03/28/2018] meropenem (MERREM) IV    . potassium chloride 10 mEq (03/27/18 1524)   PRN Meds:.sodium chloride, acetaminophen **OR** acetaminophen, albuterol, hydrALAZINE, labetalol, lidocaine, LORazepam, metoCLOPramide (REGLAN) injection, morphine injection, phenol, promethazine, sodium chloride flush   Assessment: Active Problems:   Acute pancreatitis   Distended abdomen    Plan: Patient's MRCP is pending from today to evaluate her biliary tract and pancreas.  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 In addition, it would allow for imaging of the biliary tract evaluate for pancreatic divisum or other abnormalities.  Triglycerides now less than 500 Continue to monitor  Maintaining good fluid balance is important here She is now on lactated Ringer's at 200 cc an hour.   Would recommend monitoring very closely, and increasing or decreasing fluids as necessary to avoid fluid overload.  Her white count was elevated as well, with blood cultures pending  Her last CT scan did not show any evidence of necrosis or infection on imaging.  She is on gram-negative and antifungal coverage as per ID.  Surgery following for ileus with NG tube not to suction.  patient's prognosis remains guarded given her worsening pancreatitis   LOS: 9 days   Melodie Bouillon, MD 03/27/2018, 3:26 PM

## 2018-03-27 NOTE — Progress Notes (Signed)
Per Hank in pharmacy stopped LR infusion to hang zosyn. Per pharmacy infuse over 30 minutes. Then restart LR. Patient only has a double lumen picc and a PIV.

## 2018-03-27 NOTE — Discharge Summary (Signed)
[facility] -  at [facility]   PATIENT NAME: Terri LotDiane Berry    MR#:  [id number]  DATE OF BIRTH:  [birth year]  DATE OF ADMISSION:  03/18/2018   ADMITTING PHYSICIAN: Milagros LollSrikar Sudini, MD  DATE OF DISCHARGE: 03/27/18  PRIMARY CARE PHYSICIAN: Thomes DinningWeeks, Cynthia, MD   ADMISSION DIAGNOSIS:  Acute pancreatitis without infection or necrosis, unspecified pancreatitis type [K85.90] DISCHARGE DIAGNOSIS:  Active Problems:   Acute pancreatitis   Distended abdomen  SECONDARY DIAGNOSIS:   Past Medical History:  Diagnosis Date  . Allergy   . Anxiety   . Depression   . GERD (gastroesophageal reflux disease)   . Hypertension   . IBS (irritable bowel syndrome)    HOSPITAL COURSE:   Terri Berry  is a 43 y.o. female with a known history of hypertension, chronic pain, depression, hypertriglyceridemia, history of pancreatitis presented to the emergency room complaining of worsening chronic abdominal pain and vomiting. In the ED, her lipase was 1044.  CT scan of the abdomen and pelvis showed diffuse inflammation of the pancreas with no pseudocyst. She was admitted for further management.  Severe acute on chronic pancreatitis- likely due to hypertriglyceridemia. TGs decreased from 747 > 403. -Patient has had a cholecystectomy in the past -Patient continued to worsen- repeat CT abdomen 2/17 showed progressive edema and enlargement of the uncinate process and tail of pancreas, also with left upper quadrant fluid, possibly developing pseudocyst -Initially on Zosyn, switched to meropenem by ID 2/21 -GI following- ordered MRCP for today, but this was not done prior to transfer -Pain control with morphine -Will likely require repeat CT abdomen and consideration of drainage of pseudocyst if present  Acute hypoxic respiratory failure- likely due to gastric distention from pancreatitis with atelectasis. Requiring 5L O2 by [place]. -Multiple chest x-rays without signs of pneumonia or  pulmonary edema -Incentive spirometry  New thrombosis of the superior mesenteric vein- seen on CT abdomen/pelvis 2/17 -Discussed with vascular surgery who recommended anticoagulation, as this can precipitate significant mesenteric venous thrombosis and eventually bowel ischemia -Continued heparin drip as patient is NPO  Worsening leukocytosis- concern for developing infected pancreatic pseudocyst.  -ID following- added Diflucan, switched zosyn to meropenem 2/21 -Blood cultures 2/13 negative and repeat blood cultures 2/21 pending -Procalcitonin trended down  Persistent ileus/early SBO-  seen on abdominal x-ray 2/20. -NG tube in place -Strict NPO -Continue TPN -Surgery consulted- added dulcolax suppository  Hypertension- BPs well-controlled -IV hydralazine and labetalol prn  Chronic pain syndrome -IV morphine   DISCHARGE CONDITIONS:  Acute on chronic pancreatitis Acute hypoxic respiratory failure SMV thrombosis Leukocytosis Persistent ileus/early SBO Hypertension Chronic pain syndrome CONSULTS OBTAINED:  Treatment Team:  Midge MiniumWohl, Darren, MD Lynn Itoavishankar, Jayashree, MD Stanton Kidneyoledo, Teodoro K, MD Sung AmabileSakai, Isami, DO DRUG ALLERGIES:   Allergies  Allergen Reactions  . Precedex [Dexmedetomidine Hcl In Nacl] Other (See Comments)    Significant Bradycardia  . Cefpodoxime     Tolerates augmentin and Keflex   . Clarithromycin   . Doxycycline   . Ferrous Gluconate   . Lactose Intolerance (Gi)   . Levofloxacin   . Rizatriptan   . Sulfa Antibiotics   . Topiramate   . Venlafaxine   . Zofran [Ondansetron Hcl]    DISCHARGE MEDICATIONS:   Allergies as of 03/27/2018      Reactions   Precedex [dexmedetomidine Hcl In Nacl] Other (See Comments)   Significant Bradycardia   Cefpodoxime    Tolerates augmentin and Keflex    Clarithromycin    Doxycycline  Ferrous Gluconate    Lactose Intolerance (gi)    Levofloxacin    Rizatriptan    Sulfa Antibiotics    Topiramate     Venlafaxine    Zofran [ondansetron Hcl]       Medication List    STOP taking these medications   atorvastatin 40 MG tablet Commonly known as:  LIPITOR   clonazePAM 1 MG tablet Commonly known as:  KLONOPIN   fenofibrate 160 MG tablet   FLUoxetine 40 MG capsule Commonly known as:  PROZAC   fluticasone 50 MCG/ACT nasal spray Commonly known as:  FLONASE   furosemide 20 MG tablet Commonly known as:  LASIX   ibuprofen 200 MG tablet Commonly known as:  ADVIL,MOTRIN   mirtazapine 30 MG tablet Commonly known as:  REMERON   morphine 15 MG tablet Commonly known as:  MSIR Replaced by:  morphine 2 MG/ML injection   nystatin powder Commonly known as:  MYCOSTATIN/NYSTOP   Pancrelipase (Lip-Prot-Amyl) 24000-76000 units Cpep   pregabalin 200 MG capsule Commonly known as:  LYRICA   prochlorperazine 10 MG tablet Commonly known as:  COMPAZINE   promethazine 12.5 MG tablet Commonly known as:  PHENERGAN Replaced by:  promethazine 25 MG/ML injection   senna-docusate 8.6-50 MG tablet Commonly known as:  Senokot-S     TAKE these medications   acetaminophen 325 MG tablet Commonly known as:  TYLENOL Take 2 tablets (650 mg total) by mouth every 6 (six) hours as needed for mild pain (or Fever >/= 101).   albuterol 108 (90 Base) MCG/ACT inhaler Commonly known as:  PROVENTIL HFA;VENTOLIN HFA Inhale 2 puffs into the lungs every 6 (six) hours as needed for wheezing or shortness of breath.   fluconazole 400-0.9 MG/200ML-% IVPB Commonly known as:  DIFLUCAN Inject 200 mLs (400 mg total) into the vein daily. Start taking on:  March 28, 2018   heparin 25000-0.45 UT/250ML-% infusion Inject 2,700 Units/hr into the vein continuous.   hydrALAZINE 20 MG/ML injection Commonly known as:  APRESOLINE Inject 0.5 mLs (10 mg total) into the vein every 4 (four) hours as needed (SBP > 160, DBP > 110).   insulin aspart 100 UNIT/ML injection Commonly known as:  novoLOG Inject 0-20 Units into  the skin every 4 (four) hours.   insulin glargine 100 UNIT/ML injection Commonly known as:  LANTUS Inject 0.15 mLs (15 Units total) into the skin daily. Start taking on:  March 28, 2018   labetalol 5 MG/ML injection Commonly known as:  NORMODYNE,TRANDATE Inject 2 mLs (10 mg total) into the vein every 2 (two) hours as needed (SBP > 160, DBP > 100).   LORazepam 2 MG/ML injection Commonly known as:  ATIVAN Inject 0.5 mLs (1 mg total) into the vein every 4 (four) hours as needed for anxiety.   meropenem 1 g in sodium chloride 0.9 % 100 mL Inject 1 g into the vein every 8 (eight) hours. Start taking on:  March 28, 2018   metoCLOPramide 5 MG/ML injection Commonly known as:  REGLAN Inject 2 mLs (10 mg total) into the vein every 6 (six) hours as needed.   morphine 2 MG/ML injection Inject 1 mL (2 mg total) into the vein every 2 (two) hours as needed. Replaces:  morphine 15 MG tablet   omeprazole 20 MG capsule Commonly known as:  PRILOSEC Take 40 mg by mouth 2 (two) times daily before a meal.   polyethylene glycol packet Commonly known as:  MIRALAX / GLYCOLAX Take 17 g by mouth 2 (  two) times daily.   promethazine 25 MG/ML injection Commonly known as:  PHENERGAN Inject 0.5 mLs (12.5 mg total) into the vein every 6 (six) hours as needed for nausea or vomiting. Replaces:  promethazine 12.5 MG tablet        DISCHARGE INSTRUCTIONS:  Transfer to [facility] DIET:  NPO DISCHARGE CONDITION:  Serious OXYGEN:  Oxygen: Yes.    Oxygen Delivery: 5 liters/min via Patient connected to nasal cannula oxygen DISCHARGE LOCATION:  To [facility]   If you experience worsening of your admission symptoms, develop shortness of breath, life threatening emergency, suicidal or homicidal thoughts you must seek medical attention immediately by calling 911 or calling your MD immediately  if symptoms less severe.  You Must read complete instructions/literature along with all the possible adverse  reactions/side effects for all the Medicines you take and that have been prescribed to you. Take any new Medicines after you have completely understood and accpet all the possible adverse reactions/side effects.   Please note  You were cared for by a hospitalist during your hospital stay. If you have any questions about your discharge medications or the care you received while you were in the hospital after you are discharged, you can call the unit and asked to speak with the hospitalist on call if the hospitalist that took care of you is not available. Once you are discharged, your primary care physician will handle any further medical issues. Please note that NO REFILLS for any discharge medications will be authorized once you are discharged, as it is imperative that you return to your primary care physician (or establish a relationship with a primary care physician if you do not have one) for your aftercare needs so that they can reassess your need for medications and monitor your lab values.    On the day of Discharge:  VITAL SIGNS:  Blood pressure (!) 167/94, pulse 82, temperature 99.9 F (37.7 C), resp. rate (!) 28, height 5\' 7"  (1.702 m), weight 99.3 kg, SpO2 99 %. PHYSICAL EXAMINATION:  GENERAL: Laying in bed, appears mildly uncomfortable. HEENT: Atraumatic, normocephalic. Extraocular muscles are intact. Pupils equal and reactive to light. Sclerae anicteric. No conjunctival injection.  Mouth is dry.  Tongue is swollen with some cracking. NECK: Supple. There is no jugular venous distention. No bruits, no lymphadenopathy, no thyromegaly.  HEART: RRR, no murmurs clicks or gallops LUNGS: Decreased breath sounds bilaterally without any accessory muscle usage. + Shallow breathing, no tachypnea.  Nasal cannula in place. ABDOMEN: + Distention, + moderate tenderness to palpation throughout the entire abdomen, no guarding or rebound. +hypoactive bowel sounds EXTREMITIES: No evidence of any cyanosis,  clubbing, or peripheral edema.  +2 pedal and radial pulses bilaterally.  NEUROLOGIC: The patient is alert, awake, and oriented x3 with no focal motor or sensory deficits appreciated bilaterally.  SKIN: Moist and warm with no rashes appreciated. +two ulcers present on left foot, one on the dorsal surface, and one on the plantar surface, no erythema or drainage. Psych: Not anxious, depressed DATA REVIEW:   CBC Recent Labs  Lab 03/27/18 0437  WBC 32.4*  HGB 9.0*  HCT 27.8*  PLT 485*    Chemistries  Recent Labs  Lab 03/27/18 0437  NA 130*  K 3.6  CL 94*  CO2 26  GLUCOSE 227*  BUN 18  CREATININE 0.92  CALCIUM 8.6*  MG 1.7  AST 23  ALT 22  ALKPHOS 94  BILITOT 0.4     Microbiology Results  Results for orders placed  or performed during the hospital encounter of 03/18/18  CULTURE, BLOOD (ROUTINE X 2) w Reflex to ID Panel     Status: None   Collection Time: 03/19/18  9:16 AM  Result Value Ref Range Status   Specimen Description BLOOD Left thumb  Final   Special Requests   Final    BOTTLES DRAWN AEROBIC AND ANAEROBIC Blood Culture results may not be optimal due to an inadequate volume of blood received in culture bottles   Culture   Final    NO GROWTH 5 DAYS Performed at [facility], [address] [place], [place] [address]    Report Status 03/24/2018 FINAL  Final  CULTURE, BLOOD (ROUTINE X 2) w Reflex to ID Panel     Status: None   Collection Time: 03/19/18  9:16 AM  Result Value Ref Range Status   Specimen Description BLOOD BLOOD LEFT HAND  Final   Special Requests   Final    BOTTLES DRAWN AEROBIC AND ANAEROBIC Blood Culture adequate volume   Culture   Final    NO GROWTH 5 DAYS Performed at [facility], [address] [place], [place] [address]    Report Status 03/24/2018 FINAL  Final  MRSA PCR Screening     Status: None   Collection Time: 03/19/18  4:50 PM  Result Value Ref Range Status   MRSA by PCR NEGATIVE NEGATIVE Final    Comment:         The GeneXpert MRSA Assay (FDA approved for NASAL specimens only), is one component of a comprehensive MRSA colonization surveillance program. It is not intended to diagnose MRSA infection nor to guide or monitor treatment for MRSA infections. Performed at [facility], [address] [place], [place] [address]     RADIOLOGY:  No results found.   Management plans discussed with the patient, family and they are in agreement.  CODE STATUS: Full Code   TOTAL TIME TAKING CARE OF THIS PATIENT: 45 minutes.    Jinny Blossom Vielka Klinedinst M.D on 03/27/2018 at 4:45 PM  Between 7am to 6pm - Pager [phone]  After 6pm go to [email or web address] - Social research officer, government  [facility]  Office  [phone]  CC: Primary care physician; Thomes Dinning, MD   Note: This dictation was prepared with Dragon dictation along with smaller phrase technology. Any transcriptional errors that result from this process are unintentional.

## 2018-03-27 NOTE — Progress Notes (Signed)
Patient left with [facility].

## 2018-03-27 NOTE — Progress Notes (Signed)
MD messaged: The patient was never transfer to stepdown yesterday as they did not have a bed availlability. She seems to be doing slightly better today than yesterday. Her vital signs were checked since during report it was reported that she had been sweating through the night. temp 98.4,BP 138/88, HR 78, O2 99% on 5L and Resp 18. She has a fluid order of NS running at 40ml/hr and a new one that was placed of LR to run at 239ml/hr. Do you want to D/C one?. Surgery saw her yesterday as well. Dr. Hoy Finlay order yesterday a consult for MRCP.

## 2018-03-27 NOTE — Progress Notes (Signed)
PHARMACY - ADULT TOTAL PARENTERAL NUTRITION CONSULT NOTE   Pharmacy Consult for TPN Indication: Prolonged ileus and severe acute pancreatitis  Patient Measurements: Height: 5\' 7"  (170.2 cm) Weight: 218 lb 14.7 oz (99.3 kg) IBW/kg (Calculated) : 61.6 TPN AdjBW (KG): 70.4 Body mass index is 34.29 kg/m. Usual Weight:    Assessment:   GI: Clear liquids ordered 2/18 Endo:  Lytes: Potassium 3.6, Calcium: 8.6, Magnesium: 1.7 Renal:Scr 0.92 Pulm: Cards:  Hepatobil:  Neuro: DV:VOHYW 2/13 >> Fluconazole 2/20 >>  TPN Access: PICC placed 2/16 TPN start date: 2/16 Nutritional Goals (per RD recommendation on 2/17 ): Clinimix E 5/20 at 83 mL/hr over 24 hours + 20% ILE at 20 mL/hr over 12 hours on Mon and Thur only.   Goal TPN rate is 83 ml/hr   Current Nutrition: NPO  Plan:  Clinimix 5/15 E at 58mL/hr; MVI, trace and 45 units of insulin added. Plan on 2/20 was to decrease rate by 50%. Patient is currently NPO and per team will continue rate of 50mL/hr.   Will order Lantus 15 units daily. Will increase SSI to resistant scale.   Patient received potassium IV Q1hr x 4 doses on 2/20. Will order an additional potassium IV Q1hr x 4 doses and magnesium 2g IV X 1.  Will obtain BMP and magnesium with am labs. Will obtain all other labs per protocol.   Patient with orders for LR at 250mL/hr x 12 hours.   Pharmacy will continue to monitor and adjust per consult.   MLS 03/27/2018

## 2018-03-27 NOTE — Progress Notes (Signed)
Spoke with Raquel RN, [phone]. Patient being tx to [facility]. OG intact with about 50 cc of output for my shift. Belly is distended. Morphine given for discomfort.  Foley intact adequate urine output. Patient remains NPO. Heparin infusing at 27 ml/hr. TPN infusing at 83 ml/hr. Potassium and magnesium replaced this afternoon. Blood cultures obtained. Report called to [employer] Father updated, Seward Grater and Husband through out the shift.

## 2018-03-28 LAB — BLOOD CULTURE ID PANEL (REFLEXED)

## 2018-03-28 LAB — FUNGITELL, SERUM: Fungitell Result: <31 pg/mL (ref <80)

## 2018-03-28 NOTE — Progress Notes (Signed)
PHARMACY - PHYSICIAN COMMUNICATION CRITICAL VALUE ALERT - BLOOD CULTURE IDENTIFICATION (BCID)  Results for orders placed or performed during the hospital encounter of 03/18/18  Blood Culture ID Panel (Reflexed) (Collected: 03/27/2018  4:37 PM)  Result Value Ref Range   Enterococcus species NOT DETECTED NOT DETECTED   Listeria monocytogenes NOT DETECTED NOT DETECTED   Staphylococcus species DETECTED (A) NOT DETECTED   Staphylococcus aureus (BCID) NOT DETECTED NOT DETECTED   Methicillin resistance DETECTED (A) NOT DETECTED   Streptococcus species NOT DETECTED NOT DETECTED   Streptococcus agalactiae NOT DETECTED NOT DETECTED   Streptococcus pneumoniae NOT DETECTED NOT DETECTED   Streptococcus pyogenes NOT DETECTED NOT DETECTED   Acinetobacter baumannii NOT DETECTED NOT DETECTED   Enterobacteriaceae species NOT DETECTED NOT DETECTED   Enterobacter cloacae complex NOT DETECTED NOT DETECTED   Escherichia coli NOT DETECTED NOT DETECTED   Klebsiella oxytoca NOT DETECTED NOT DETECTED   Klebsiella pneumoniae NOT DETECTED NOT DETECTED   Proteus species NOT DETECTED NOT DETECTED   Serratia marcescens NOT DETECTED NOT DETECTED   Haemophilus influenzae NOT DETECTED NOT DETECTED   Neisseria meningitidis NOT DETECTED NOT DETECTED   Pseudomonas aeruginosa NOT DETECTED NOT DETECTED   Candida albicans NOT DETECTED NOT DETECTED   Candida glabrata NOT DETECTED NOT DETECTED   Candida krusei NOT DETECTED NOT DETECTED   Candida parapsilosis NOT DETECTED NOT DETECTED   Candida tropicalis NOT DETECTED NOT DETECTED    Name of physician (or Provider) Contacted: Pt discharged at the time call was received.  Pt was transferred to [facility] on 2/21 PM.  Spoke with Almira Coaster , RN covering the pt and communicated biofire results to her.   Changes to prescribed antibiotics required:   Most likely contaminant.   Caley Volkert D 03/28/2018  5:45 PM

## 2018-03-31 LAB — CULTURE, BLOOD (ROUTINE X 2)

## 2018-04-01 LAB — CULTURE, BLOOD (ROUTINE X 2)
Culture: NO GROWTH
Special Requests: ADEQUATE

## 2018-05-01 ENCOUNTER — Encounter: Payer: Self-pay | Admitting: Emergency Medicine

## 2018-05-01 ENCOUNTER — Other Ambulatory Visit: Payer: Self-pay

## 2018-05-01 ENCOUNTER — Emergency Department
Admission: EM | Admit: 2018-05-01 | Discharge: 2018-05-01 | Disposition: A | Discharge status: Home / Self Care | Payer: Medicaid Other | Attending: Emergency Medicine | Admitting: Emergency Medicine

## 2018-05-01 DIAGNOSIS — F17210 Nicotine dependence, cigarettes, uncomplicated: Secondary | ICD-10-CM | POA: Insufficient documentation

## 2018-05-01 DIAGNOSIS — I10 Essential (primary) hypertension: Secondary | ICD-10-CM | POA: Insufficient documentation

## 2018-05-01 DIAGNOSIS — R109 Unspecified abdominal pain: Secondary | ICD-10-CM | POA: Insufficient documentation

## 2018-05-01 DIAGNOSIS — Z79899 Other long term (current) drug therapy: Secondary | ICD-10-CM | POA: Diagnosis not present

## 2018-05-01 LAB — COMPREHENSIVE METABOLIC PANEL
ALBUMIN: 3.6 g/dL (ref 3.5–5.0)
ALT: 23 U/L (ref 0–44)
AST: 24 U/L (ref 15–41)
Alkaline Phosphatase: 56 U/L (ref 38–126)
Anion gap: 14 (ref 5–15)
BUN: 8 mg/dL (ref 6–20)
CO2: 21 mmol/L — AB (ref 22–32)
Calcium: 8.6 mg/dL — ABNORMAL LOW (ref 8.9–10.3)
Chloride: 106 mmol/L (ref 98–111)
Creatinine, Ser: 1.08 mg/dL — ABNORMAL HIGH (ref 0.44–1.00)
GFR calc Af Amer: >60 mL/min (ref >60)
GFR calc non Af Amer: >60 mL/min (ref >60)
GLUCOSE: 129 mg/dL — AB (ref 70–99)
Potassium: 3.2 mmol/L — ABNORMAL LOW (ref 3.5–5.1)
SODIUM: 141 mmol/L (ref 135–145)
Total Bilirubin: 0.4 mg/dL (ref 0.3–1.2)
Total Protein: 7.7 g/dL (ref 6.5–8.1)

## 2018-05-01 LAB — CBC WITH DIFFERENTIAL/PLATELET
Abs Immature Granulocytes: 0.02 10*3/uL (ref 0.00–0.07)
BASOS ABS: 0.1 10*3/uL (ref 0.0–0.1)
Basophils Relative: 1 %
Eosinophils Absolute: 0.3 10*3/uL (ref 0.0–0.5)
Eosinophils Relative: 4 %
HCT: 27.5 % — ABNORMAL LOW (ref 36.0–46.0)
Hemoglobin: 8.2 g/dL — ABNORMAL LOW (ref 12.0–15.0)
Immature Granulocytes: 0 %
Lymphocytes Relative: 44 %
Lymphs Abs: 3.2 10*3/uL (ref 0.7–4.0)
MCH: 24 pg — ABNORMAL LOW (ref 26.0–34.0)
MCHC: 29.8 g/dL — ABNORMAL LOW (ref 30.0–36.0)
MCV: 80.6 fL (ref 80.0–100.0)
Monocytes Absolute: 0.4 10*3/uL (ref 0.1–1.0)
Monocytes Relative: 6 %
Neutro Abs: 3.3 10*3/uL (ref 1.7–7.7)
Neutrophils Relative %: 45 %
PLATELETS: 360 10*3/uL (ref 150–400)
RBC: 3.41 MIL/uL — ABNORMAL LOW (ref 3.87–5.11)
RDW: 13.7 % (ref 11.5–15.5)
WBC: 7.3 10*3/uL (ref 4.0–10.5)
nRBC: 0 % (ref 0.0–0.2)

## 2018-05-01 LAB — LIPASE, BLOOD: Lipase: 34 U/L (ref 11–51)

## 2018-05-01 MED ORDER — KETOROLAC TROMETHAMINE 10 MG PO TABS: 10.0000 mg | ORAL_TABLET | Freq: Four times a day (QID) | ORAL | 0 refills | Status: DC | PRN

## 2018-05-01 MED ORDER — KETOROLAC TROMETHAMINE 10 MG PO TABS
10.0000 mg | ORAL_TABLET | Freq: Once | ORAL | Status: AC
  Administered 2018-05-01: 10 mg via ORAL
  Filled 2018-05-01: qty 1

## 2018-05-01 NOTE — ED Triage Notes (Addendum)
Pt to triage via w/c with no distress noted, brought in by EMS for abd pain radiating into lower abd accomp by nausea; also st hx "deteriorating spine disease" and having increased pain to neck & back; pt also reports she has a HA as well

## 2018-05-01 NOTE — ED Provider Notes (Signed)
[facility] Emergency Department Provider Note    First MD Initiated Contact with Patient 05/01/18 0240     (approximate)  I have reviewed the triage vital signs and the nursing notes.   HISTORY  Chief Complaint Abdominal Pain    HPI Terri Berry is a 43 y.o. female with below list of previous medical conditions and recent evaluation at [facility] on 04/27/2018 presents to the emergency department with complaint of 10 out of 10 abdominal pain with accompanying nausea.  On my arrival to the room the patient asleep difficult to arouse with verbal stimuli.  After shaking the patient awake she stated that she had 10 out of 10 abdominal pain and then fell asleep again.  This happened repetitively during my evaluation of the patient.  Patient states that she took 1 oxycodone at 8 PM tonight.        Past Medical History:  Diagnosis Date  . Allergy   . Anxiety   . Depression   . GERD (gastroesophageal reflux disease)   . Hypertension   . IBS (irritable bowel syndrome)     Patient Active Problem List   Diagnosis Date Noted  . Distended abdomen   . Acute pancreatitis 03/18/2018  . Acute hypoxemic respiratory failure (HCC) 02/09/2018  . Overdose 11/28/2017    Past Surgical History:  Procedure Laterality Date  . ABDOMINAL HYSTERECTOMY    . GALLBLADDER SURGERY      Prior to Admission medications   Medication Sig Start Date End Date Taking? Authorizing Provider  acetaminophen (TYLENOL) 325 MG tablet Take 2 tablets (650 mg total) by mouth every 6 (six) hours as needed for mild pain (or Fever >/= 101). 03/27/18   Mayo, Allyn Kenner, MD  albuterol (PROVENTIL HFA;VENTOLIN HFA) 108 (90 Base) MCG/ACT inhaler Inhale 2 puffs into the lungs every 6 (six) hours as needed for wheezing or shortness of breath. 02/15/18   Shaune Pollack, MD  fluconazole (DIFLUCAN) 400-0.9 MG/200ML-% IVPB Inject 200 mLs (400 mg total) into the vein daily. 03/28/18   Mayo, Allyn Kenner, MD   heparin 25000-0.45 UT/250ML-% infusion Inject 2,700 Units/hr into the vein continuous. 03/27/18   Mayo, Allyn Kenner, MD  hydrALAZINE (APRESOLINE) 20 MG/ML injection Inject 0.5 mLs (10 mg total) into the vein every 4 (four) hours as needed (SBP > 160, DBP > 110). 03/27/18   Mayo, Allyn Kenner, MD  insulin aspart (NOVOLOG) 100 UNIT/ML injection Inject 0-20 Units into the skin every 4 (four) hours. 03/27/18   Mayo, Allyn Kenner, MD  insulin glargine (LANTUS) 100 UNIT/ML injection Inject 0.15 mLs (15 Units total) into the skin daily. 03/28/18   Mayo, Allyn Kenner, MD  labetalol (NORMODYNE,TRANDATE) 5 MG/ML injection Inject 2 mLs (10 mg total) into the vein every 2 (two) hours as needed (SBP > 160, DBP > 100). 03/27/18   Mayo, Allyn Kenner, MD  LORazepam (ATIVAN) 2 MG/ML injection Inject 0.5 mLs (1 mg total) into the vein every 4 (four) hours as needed for anxiety. 03/27/18   Mayo, Allyn Kenner, MD  meropenem 1 g in sodium chloride 0.9 % 100 mL Inject 1 g into the vein every 8 (eight) hours. 03/28/18   Mayo, Allyn Kenner, MD  metoCLOPramide (REGLAN) 5 MG/ML injection Inject 2 mLs (10 mg total) into the vein every 6 (six) hours as needed. 03/27/18   Mayo, Allyn Kenner, MD  morphine 2 MG/ML injection Inject 1 mL (2 mg total) into the vein every 2 (two) hours as needed. 03/27/18  Mayo, Allyn Kenner, MD  omeprazole (PRILOSEC) 20 MG capsule Take 40 mg by mouth 2 (two) times daily before a meal.    [provider]  polyethylene glycol (MIRALAX / GLYCOLAX) packet Take 17 g by mouth 2 (two) times daily.    [provider]  promethazine (PHENERGAN) 25 MG/ML injection Inject 0.5 mLs (12.5 mg total) into the vein every 6 (six) hours as needed for nausea or vomiting. 03/27/18   Mayo, Allyn Kenner, MD    Allergies Precedex [dexmedetomidine hcl in nacl]; Cefpodoxime; Clarithromycin; Doxycycline; Ferrous gluconate; Lactose intolerance (gi); Levofloxacin; Rizatriptan; Sulfa antibiotics; Topiramate; Venlafaxine; and Zofran [ondansetron hcl]   Family History  Problem Relation Age of Onset  . Diabetes Father   . Heart disease Father   . Kidney disease Father   . COPD Father   . Cancer Paternal Aunt   . Diabetes Paternal Uncle   . Heart disease Paternal Uncle     Social History Social History   Tobacco Use  . Smoking status: Current Every Day Smoker    Packs/day: 0.50    Types: Cigarettes  . Smokeless tobacco: Never Used  Substance Use Topics  . Alcohol use: Not Currently  . Drug use: Not Currently    Review of Systems Constitutional: No fever/chills Eyes: No visual changes. ENT: No sore throat. Cardiovascular: Denies chest pain. Respiratory: Denies shortness of breath. Gastrointestinal: Positive for abdominal pain.  No nausea, no vomiting.  No diarrhea.  No constipation. Genitourinary: Negative for dysuria. Musculoskeletal: Negative for neck pain.  Negative for back pain. Integumentary: Negative for rash. Neurological: Negative for headaches, focal weakness or numbness.   ____________________________________________   PHYSICAL EXAM:  VITAL SIGNS: ED Triage Vitals [05/01/18 0031]  Enc Vitals Group     BP 111/80     Pulse Rate 92     Resp 18     Temp 98 F (36.7 C)     Temp Source Oral     SpO2 100 %     Weight 90.7 kg (200 lb)     Height 1.702 m (5\' 7" )     Head Circumference      Peak Flow      Pain Score 10     Pain Loc      Pain Edu?      Excl. in GC?     Constitutional: Alert and oriented. Well appearing and in no acute distress. Eyes: Conjunctivae are normal.  Pinpoint pupils bilaterally  head: Atraumatic. Mouth/Throat: Mucous membranes are moist.  Oropharynx non-erythematous. Neck: No stridor.  Cardiovascular: Normal rate, regular rhythm. Good peripheral circulation. Grossly normal heart sounds. Respiratory: Normal respiratory effort.  No retractions. Lungs CTAB. Gastrointestinal: Soft and nontender. No distention.  Musculoskeletal: No lower extremity tenderness nor edema. No  gross deformities of extremities. Neurologic:  Normal speech and language. No gross focal neurologic deficits are appreciated.  Skin:  Skin is warm, dry and intact. No rash noted. Psychiatric: Mood and affect are normal. Speech and behavior are normal. ____________________________________________   LABS (all labs ordered are listed, but only abnormal results are displayed)  Labs Reviewed  CBC WITH DIFFERENTIAL/PLATELET - Abnormal; Notable for the following components:      Result Value   RBC 3.41 (*)    Hemoglobin 8.2 (*)    HCT 27.5 (*)    MCH 24.0 (*)    MCHC 29.8 (*)    All other components within normal limits  COMPREHENSIVE METABOLIC PANEL - Abnormal; Notable for the following components:  Potassium 3.2 (*)    CO2 21 (*)    Glucose, Bld 129 (*)    Creatinine, Ser 1.08 (*)    Calcium 8.6 (*)    All other components within normal limits  LIPASE, BLOOD  URINALYSIS, COMPLETE (UACMP) WITH MICROSCOPIC    Procedures   ____________________________________________   INITIAL IMPRESSION / MDM / ASSESSMENT AND PLAN / ED COURSE  As part of my medical decision making, I reviewed the following data within the electronic MEDICAL RECORD NUMBER   43 year old female presenting to the emergency department above-stated history and physical exam.  Patient markedly somnolent during my evaluation falling asleep repetitively with pinpoint pupils.  Patient admits to abdominal pain however no abdominal pain elicited with distraction.  Evaluation from [facility] reviewed which revealed fluid in the left anterior pararenal space with a small hematoma in the left midabdomen likely to be sequelae of recent pancreatitis.  Patient's lipase today normal.  Additional laboratory data consistent with previous including known anemia. ____________________________________________  FINAL CLINICAL IMPRESSION(S) / ED DIAGNOSES  Final diagnoses:  Abdominal pain, unspecified abdominal location     MEDICATIONS GIVEN  DURING THIS VISIT:  Medications - No data to display   ED Discharge Orders    None       Note:  This document was prepared using Dragon voice recognition software and may include unintentional dictation errors.   Darci Current, MD 05/01/18 [phone]

## 2018-05-01 NOTE — ED Notes (Signed)
MD Manson Passey at bedside. Pt noted to be falling asleep during exam. Pt having to be awoken many times to finish exam. Pt falling asleep mid-sentence while conversing with doctor brown.

## 2018-05-06 LAB — FUNGUS CULTURE, BLOOD
Culture: NO GROWTH
Special Requests: ADEQUATE

## 2018-05-25 ENCOUNTER — Inpatient Hospital Stay
Admission: EM | Admit: 2018-05-25 | Discharge: 2018-05-28 | DRG: 641 | Disposition: A | Discharge status: Home / Self Care | Payer: Medicaid Other | Attending: Internal Medicine | Admitting: Internal Medicine

## 2018-05-25 ENCOUNTER — Emergency Department: Payer: Medicaid Other

## 2018-05-25 ENCOUNTER — Other Ambulatory Visit: Payer: Self-pay

## 2018-05-25 DIAGNOSIS — K219 Gastro-esophageal reflux disease without esophagitis: Secondary | ICD-10-CM | POA: Diagnosis present

## 2018-05-25 DIAGNOSIS — I10 Essential (primary) hypertension: Secondary | ICD-10-CM | POA: Diagnosis present

## 2018-05-25 DIAGNOSIS — K589 Irritable bowel syndrome without diarrhea: Secondary | ICD-10-CM | POA: Diagnosis present

## 2018-05-25 DIAGNOSIS — Z79899 Other long term (current) drug therapy: Secondary | ICD-10-CM

## 2018-05-25 DIAGNOSIS — Z833 Family history of diabetes mellitus: Secondary | ICD-10-CM

## 2018-05-25 DIAGNOSIS — Z794 Long term (current) use of insulin: Secondary | ICD-10-CM

## 2018-05-25 DIAGNOSIS — E119 Type 2 diabetes mellitus without complications: Secondary | ICD-10-CM

## 2018-05-25 DIAGNOSIS — Z8249 Family history of ischemic heart disease and other diseases of the circulatory system: Secondary | ICD-10-CM

## 2018-05-25 DIAGNOSIS — L089 Local infection of the skin and subcutaneous tissue, unspecified: Secondary | ICD-10-CM | POA: Diagnosis present

## 2018-05-25 DIAGNOSIS — K861 Other chronic pancreatitis: Secondary | ICD-10-CM | POA: Diagnosis present

## 2018-05-25 DIAGNOSIS — E860 Dehydration: Principal | ICD-10-CM | POA: Diagnosis present

## 2018-05-25 DIAGNOSIS — F329 Major depressive disorder, single episode, unspecified: Secondary | ICD-10-CM | POA: Diagnosis present

## 2018-05-25 DIAGNOSIS — D509 Iron deficiency anemia, unspecified: Secondary | ICD-10-CM | POA: Diagnosis present

## 2018-05-25 DIAGNOSIS — Z79890 Hormone replacement therapy (postmenopausal): Secondary | ICD-10-CM

## 2018-05-25 DIAGNOSIS — I771 Stricture of artery: Secondary | ICD-10-CM | POA: Diagnosis present

## 2018-05-25 DIAGNOSIS — Z841 Family history of disorders of kidney and ureter: Secondary | ICD-10-CM

## 2018-05-25 DIAGNOSIS — Z7901 Long term (current) use of anticoagulants: Secondary | ICD-10-CM

## 2018-05-25 DIAGNOSIS — D649 Anemia, unspecified: Secondary | ICD-10-CM

## 2018-05-25 DIAGNOSIS — F419 Anxiety disorder, unspecified: Secondary | ICD-10-CM | POA: Diagnosis present

## 2018-05-25 DIAGNOSIS — I708 Atherosclerosis of other arteries: Secondary | ICD-10-CM | POA: Diagnosis present

## 2018-05-25 DIAGNOSIS — F17210 Nicotine dependence, cigarettes, uncomplicated: Secondary | ICD-10-CM | POA: Diagnosis present

## 2018-05-25 DIAGNOSIS — Z882 Allergy status to sulfonamides status: Secondary | ICD-10-CM

## 2018-05-25 DIAGNOSIS — I959 Hypotension, unspecified: Secondary | ICD-10-CM | POA: Diagnosis present

## 2018-05-25 DIAGNOSIS — N179 Acute kidney failure, unspecified: Secondary | ICD-10-CM | POA: Diagnosis present

## 2018-05-25 DIAGNOSIS — Z881 Allergy status to other antibiotic agents status: Secondary | ICD-10-CM

## 2018-05-25 DIAGNOSIS — Z888 Allergy status to other drugs, medicaments and biological substances status: Secondary | ICD-10-CM

## 2018-05-25 HISTORY — DX: Type 2 diabetes mellitus without complications: E11.9

## 2018-05-25 LAB — COMPREHENSIVE METABOLIC PANEL
ALT: 11 U/L (ref 0–44)
AST: 15 U/L (ref 15–41)
Albumin: 3.8 g/dL (ref 3.5–5.0)
Alkaline Phosphatase: 54 U/L (ref 38–126)
Anion gap: 13 (ref 5–15)
BUN: 19 mg/dL (ref 6–20)
CO2: 23 mmol/L (ref 22–32)
Calcium: 8.5 mg/dL — ABNORMAL LOW (ref 8.9–10.3)
Chloride: 101 mmol/L (ref 98–111)
Creatinine, Ser: 1.52 mg/dL — ABNORMAL HIGH (ref 0.44–1.00)
GFR calc Af Amer: 48 mL/min — ABNORMAL LOW (ref >60)
GFR calc non Af Amer: 41 mL/min — ABNORMAL LOW (ref >60)
Glucose, Bld: 141 mg/dL — ABNORMAL HIGH (ref 70–99)
Potassium: 3.2 mmol/L — ABNORMAL LOW (ref 3.5–5.1)
Sodium: 137 mmol/L (ref 135–145)
Total Bilirubin: <0.1 mg/dL — ABNORMAL LOW (ref 0.3–1.2)
Total Protein: 7.4 g/dL (ref 6.5–8.1)

## 2018-05-25 LAB — CBC WITH DIFFERENTIAL/PLATELET
Abs Immature Granulocytes: 0.05 10*3/uL (ref 0.00–0.07)
Basophils Absolute: 0.1 10*3/uL (ref 0.0–0.1)
Basophils Relative: 1 %
Eosinophils Absolute: 0.1 10*3/uL (ref 0.0–0.5)
Eosinophils Relative: 1 %
HCT: 27.3 % — ABNORMAL LOW (ref 36.0–46.0)
Hemoglobin: 8 g/dL — ABNORMAL LOW (ref 12.0–15.0)
Immature Granulocytes: 0 %
Lymphocytes Relative: 30 %
Lymphs Abs: 3.5 10*3/uL (ref 0.7–4.0)
MCH: 22.5 pg — ABNORMAL LOW (ref 26.0–34.0)
MCHC: 29.3 g/dL — ABNORMAL LOW (ref 30.0–36.0)
MCV: 76.7 fL — ABNORMAL LOW (ref 80.0–100.0)
Monocytes Absolute: 1 10*3/uL (ref 0.1–1.0)
Monocytes Relative: 9 %
Neutro Abs: 6.8 10*3/uL (ref 1.7–7.7)
Neutrophils Relative %: 59 %
Platelets: 374 10*3/uL (ref 150–400)
RBC: 3.56 MIL/uL — ABNORMAL LOW (ref 3.87–5.11)
RDW: 15.2 % (ref 11.5–15.5)
WBC: 11.6 10*3/uL — ABNORMAL HIGH (ref 4.0–10.5)
nRBC: 0 % (ref 0.0–0.2)

## 2018-05-25 LAB — LIPASE, BLOOD: Lipase: 49 U/L (ref 11–51)

## 2018-05-25 LAB — TROPONIN I: Troponin I: <0.03 ng/mL (ref <0.03)

## 2018-05-25 LAB — LACTIC ACID, PLASMA: Lactic Acid, Venous: 1.7 mmol/L (ref 0.5–1.9)

## 2018-05-25 MED ORDER — IOHEXOL 350 MG/ML SOLN
75.0000 mL | Freq: Once | INTRAVENOUS | Status: AC | PRN
  Administered 2018-05-25: 75 mL via INTRAVENOUS

## 2018-05-25 MED ORDER — SODIUM CHLORIDE 0.9 % IV BOLUS
1000.0000 mL | Freq: Once | INTRAVENOUS | Status: AC
  Administered 2018-05-25: 1000 mL via INTRAVENOUS

## 2018-05-25 NOTE — ED Notes (Signed)
IV consult in with patient.

## 2018-05-25 NOTE — ED Notes (Signed)
Patient transferred to CT at this time

## 2018-05-25 NOTE — ED Notes (Signed)
Xray in with patient at this time

## 2018-05-25 NOTE — ED Triage Notes (Signed)
Patient from home arriving by [facility] for hypotension. Per EMS patients intial BP 63/32 after NS BP 80/50. Patient complaining of dizziness, headache and chest discomfort.  Patient A&O x4.

## 2018-05-25 NOTE — ED Provider Notes (Signed)
[facility] Emergency Department Provider Note  ____________________________________________  Time seen: Approximately 9:53 PM  I have reviewed the triage vital signs and the nursing notes.   HISTORY  Chief Complaint Chest Pain and Hypotension   HPI Terri Berry is a 43 y.o. 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  Patient reports that yesterday she started having sore throat and soreness on her chest and abdomen.  Today she woke up feeling slightly worse.  No fever or chills.  Did not really have much of an appetite.  Did not drink or eat much today.  Still took her hypertension medications.  She was walking in the kitchen when she started feeling dizzy like she was going to pass out.  She lowered herself to the floor.  She denies full syncope or any injuries.  She was unable to get up on 911 was called.  When they arrived, patient was found to have systolics in the 60s.  They started IV fluids.  Patient is complaining of mild constant diffuse soreness of the chest and abdomen, she denies cough or congestion, chest pain, fever or chills, vomiting, diarrhea, dysuria or hematuria.  Past Medical History:  Diagnosis Date  . Allergy   . Anxiety   . Depression   . Diabetes (HCC)   . GERD (gastroesophageal reflux disease)   . GERD (gastroesophageal reflux disease)   . Hypertension   . IBS (irritable bowel syndrome)     Patient Active Problem List   Diagnosis Date Noted  . Hypotension 05/26/2018  . Subclavian arterial stenosis (HCC) 05/26/2018  . Diabetes (HCC) 05/26/2018  . GERD (gastroesophageal reflux disease) 05/25/2018  . HTN (hypertension) 05/25/2018  . Anxiety 05/25/2018  . AKI (acute kidney injury) (HCC) 05/25/2018  . Distended abdomen   . Acute pancreatitis 03/18/2018  .  Acute hypoxemic respiratory failure (HCC) 02/09/2018  . Overdose 11/28/2017    Past Surgical History:  Procedure Laterality Date  . ABDOMINAL HYSTERECTOMY    . GALLBLADDER SURGERY      Prior to Admission medications   Medication Sig Start Date End Date Taking? Authorizing Provider  albuterol (PROVENTIL HFA;VENTOLIN HFA) 108 (90 Base) MCG/ACT inhaler Inhale 2 puffs into the lungs every 6 (six) hours as needed for wheezing or shortness of breath. 02/15/18  Yes Shaune Pollack, MD  amLODipine (NORVASC) 10 MG tablet Take 10 mg by mouth daily. 05/07/18 11/03/18 Yes [provider]  apixaban (ELIQUIS) 5 MG TABS tablet Take 5 mg by mouth every 12 (twelve) hours. 04/20/18 06/19/18 Yes [provider]  atorvastatin (LIPITOR) 40 MG tablet Take 40 mg by mouth daily. 05/07/18 11/03/18 Yes [provider]  busPIRone (BUSPAR) 10 MG tablet Take 10 mg by mouth 3 (three) times daily. 04/20/18 11/03/18 Yes [provider]  clonazePAM (KLONOPIN) 1 MG tablet Take 1 mg by mouth 3 (three) times daily. 05/07/18 08/05/18 Yes [provider]  dicyclomine (BENTYL) 10 MG capsule Take 10 mg by mouth 3 (three) times daily as needed for spasms. 05/11/18 05/11/19 Yes [provider]  fenofibrate 160 MG tablet Take 160 mg by mouth daily. 05/07/18 11/03/18 Yes [provider]  FLUoxetine (PROZAC) 20 MG capsule Take 40 mg by mouth daily.  05/07/18 11/03/18 Yes [provider]  furosemide (LASIX) 20 MG tablet Take 20 mg by mouth 2 (two) times daily as needed for edema or  fluid.   Yes [provider]  lisinopril (ZESTRIL) 20 MG tablet Take 20 mg by mouth daily.   Yes [provider]  Melatonin 3 MG TABS Take 6 mg by mouth at bedtime.   Yes [provider]  mirtazapine (REMERON) 30 MG tablet Take 30 mg by mouth at bedtime. 05/19/18  Yes [provider]  omeprazole (PRILOSEC) 20 MG capsule Take 40 mg by mouth 2 (two) times daily. 05/07/18  Yes [provider]  Pancrelipase, Lip-Prot-Amyl, 24000-76000 units CPEP Take 2 capsules by mouth 3 (three) times daily with meals.   Yes [provider]  pregabalin (LYRICA) 200 MG capsule Take 200 mg by mouth 3 (three) times daily. 05/07/18  Yes [provider]  prochlorperazine (COMPAZINE) 10 MG tablet Take 10 mg by mouth daily. 04/20/18  Yes [provider]  promethazine (PHENERGAN) 25 MG tablet Take 25 mg by mouth at bedtime as needed for nausea. 04/22/18  Yes [provider]  tiZANidine (ZANAFLEX) 4 MG tablet Take 4 mg by mouth 2 (two) times daily as needed for muscle spasms. 05/22/18 05/22/19 Yes [provider]  Vitamin D, Ergocalciferol, (DRISDOL) 1.25 MG (50000 UT) CAPS capsule Take 50,000 Units by mouth once a week. 04/20/18 07/24/18 Yes [provider]  acetaminophen (TYLENOL) 325 MG tablet Take 2 tablets (650 mg total) by mouth every 6 (six) hours as needed for mild pain (or Fever >/= 101). Patient not taking: Reported on 05/26/2018 03/27/18   MayoAllyn Kenner, MD  fluconazole (DIFLUCAN) 400-0.9 MG/200ML-% IVPB Inject 200 mLs (400 mg total) into the vein daily. Patient not taking: Reported on 05/26/2018 03/28/18   Campbell Stall, MD  heparin 25000-0.45 UT/250ML-% infusion Inject 2,700 Units/hr into the vein continuous. Patient not taking: Reported on 05/26/2018 03/27/18   Mayo, Allyn Kenner, MD  hydrALAZINE (APRESOLINE) 20 MG/ML injection Inject 0.5 mLs (10 mg total) into the vein every 4 (four) hours as needed (SBP > 160, DBP > 110). Patient not taking: Reported on 05/26/2018 03/27/18   Mayo, Allyn Kenner, MD  insulin aspart (NOVOLOG) 100 UNIT/ML injection Inject 0-20 Units into the skin every 4 (four) hours. Patient not taking: Reported on 05/26/2018 03/27/18   Mayo, Allyn Kenner, MD  insulin glargine (LANTUS) 100 UNIT/ML injection Inject 0.15 mLs (15 Units total) into the skin daily. Patient not taking: Reported on 05/26/2018 03/28/18   Mayo, Allyn Kenner, MD   ketorolac (TORADOL) 10 MG tablet Take 1 tablet (10 mg total) by mouth every 6 (six) hours as needed. Patient not taking: Reported on 05/26/2018 05/01/18   Darci Current, MD  labetalol (NORMODYNE,TRANDATE) 5 MG/ML injection Inject 2 mLs (10 mg total) into the vein every 2 (two) hours as needed (SBP > 160, DBP > 100). Patient not taking: Reported on 05/26/2018 03/27/18   Mayo, Allyn Kenner, MD  LORazepam (ATIVAN) 2 MG/ML injection Inject 0.5 mLs (1 mg total) into the vein every 4 (four) hours as needed for anxiety. Patient not taking: Reported on 05/26/2018 03/27/18   Mayo, Allyn Kenner, MD  meropenem 1 g in sodium chloride 0.9 % 100 mL Inject 1 g into the vein every 8 (eight) hours. Patient not taking: Reported on 05/26/2018 03/28/18   Mayo, Allyn Kenner, MD  metoCLOPramide (REGLAN) 5 MG/ML injection Inject 2 mLs (10 mg total) into the vein every 6 (six) hours as needed. Patient not taking: Reported on 05/26/2018 03/27/18   MayoAllyn Kenner, MD  morphine 2 MG/ML injection Inject 1 mL (  2 mg total) into the vein every 2 (two) hours as needed. Patient not taking: Reported on 05/26/2018 03/27/18   MayoAllyn Kenner, MD  promethazine (PHENERGAN) 25 MG/ML injection Inject 0.5 mLs (12.5 mg total) into the vein every 6 (six) hours as needed for nausea or vomiting. Patient not taking: Reported on 05/26/2018 03/27/18   Mayo, Allyn Kenner, MD    Allergies Precedex [dexmedetomidine hcl in nacl]; Cefpodoxime; Clarithromycin; Doxycycline; Ferrous gluconate; Lactose intolerance (gi); Levofloxacin; Rizatriptan; Sulfa antibiotics; Topiramate; Venlafaxine; and Zofran [ondansetron hcl]  Family History  Problem Relation Age of Onset  . Diabetes Father   . Heart disease Father   . Kidney disease Father   . COPD Father   . Cancer Paternal Aunt   . Diabetes Paternal Uncle   . Heart disease Paternal Uncle     Social History Social History   Tobacco Use  . Smoking status: Current Every Day Smoker    Packs/day: 0.50    Types:  Cigarettes  . Smokeless tobacco: Never Used  Substance Use Topics  . Alcohol use: Not Currently  . Drug use: Not Currently    Review of Systems  Constitutional: Negative for fever. + dizziness and weakness Eyes: Negative for visual changes. ENT: + sore throat. Neck: No neck pain  Cardiovascular: + chest soreness. Respiratory: Negative for shortness of breath. Gastrointestinal: + abdominal soreness. No vomiting or diarrhea. Genitourinary: Negative for dysuria. Musculoskeletal: Negative for back pain. Skin: Negative for rash. Neurological: Negative for headaches, weakness or numbness. Psych: No SI or HI  ____________________________________________   PHYSICAL EXAM:  VITAL SIGNS: Vitals:   05/26/18 0635 05/26/18 1156  BP:  116/82  Pulse: 79 71  Resp:    Temp:  97.7 F (36.5 C)  SpO2: 95% 99%   Constitutional: Alert and oriented. Well appearing and in no apparent distress. HEENT:      Head: Normocephalic and atraumatic.         Eyes: Conjunctivae are normal. Sclera is non-icteric.       Mouth/Throat: Mucous membranes are dry. Oropharynx is clear      Neck: Supple with no signs of meningismus. Cardiovascular: Regular rate and rhythm. No murmurs, gallops, or rubs. 2+ symmetrical distal pulses are present in all extremities. No JVD. Respiratory: Normal respiratory effort. Lungs are clear to auscultation bilaterally. No wheezes, crackles, or rhonchi.  Gastrointestinal: Soft, mild diffuse tenderness to palpation, and non distended with positive bowel sounds. No rebound or guarding. Musculoskeletal: Nontender with normal range of motion in all extremities. No edema, cyanosis, or erythema of extremities. Neurologic: Normal speech and language. Face is symmetric. Moving all extremities. No gross focal neurologic deficits are appreciated. Skin: Skin is warm, dry and intact. No rash noted. Psychiatric: Mood and affect are normal. Speech and behavior are normal.   ____________________________________________   LABS (all labs ordered are listed, but only abnormal results are displayed)  Labs Reviewed  CBC WITH DIFFERENTIAL/PLATELET - Abnormal; Notable for the following components:      Result Value   WBC 11.6 (*)    RBC 3.56 (*)    Hemoglobin 8.0 (*)    HCT 27.3 (*)    MCV 76.7 (*)    MCH 22.5 (*)    MCHC 29.3 (*)    All other components within normal limits  COMPREHENSIVE METABOLIC PANEL - Abnormal; Notable for the following components:   Potassium 3.2 (*)    Glucose, Bld 141 (*)    Creatinine, Ser 1.52 (*)    Calcium  8.5 (*)    Total Bilirubin <0.1 (*)    GFR calc non Af Amer 41 (*)    GFR calc Af Amer 48 (*)    All other components within normal limits  URINALYSIS, COMPLETE (UACMP) WITH MICROSCOPIC - Abnormal; Notable for the following components:   Color, Urine STRAW (*)    APPearance CLEAR (*)    All other components within normal limits  BASIC METABOLIC PANEL - Abnormal; Notable for the following components:   Potassium 3.3 (*)    Glucose, Bld 258 (*)    Creatinine, Ser 1.33 (*)    Calcium 8.4 (*)    GFR calc non Af Amer 48 (*)    GFR calc Af Amer 56 (*)    All other components within normal limits  CBC - Abnormal; Notable for the following components:   RBC 3.41 (*)    Hemoglobin 7.7 (*)    HCT 26.5 (*)    MCV 77.7 (*)    MCH 22.6 (*)    MCHC 29.1 (*)    All other components within normal limits  GLUCOSE, CAPILLARY - Abnormal; Notable for the following components:   Glucose-Capillary 169 (*)    All other components within normal limits  VITAMIN B12 - Abnormal; Notable for the following components:   Vitamin B-12 146 (*)    All other components within normal limits  IRON AND TIBC - Abnormal; Notable for the following components:   Iron 11 (*)    TIBC 600 (*)    Saturation Ratios 2 (*)    All other components within normal limits  FERRITIN - Abnormal; Notable for the following components:   Ferritin 3 (*)    All  other components within normal limits  RETICULOCYTES - Abnormal; Notable for the following components:   RBC. 3.40 (*)    Immature Retic Fract 24.1 (*)    All other components within normal limits  GLUCOSE, CAPILLARY - Abnormal; Notable for the following components:   Glucose-Capillary 196 (*)    All other components within normal limits  GLUCOSE, CAPILLARY - Abnormal; Notable for the following components:   Glucose-Capillary 205 (*)    All other components within normal limits  LIPASE, BLOOD  TROPONIN I  LACTIC ACID, PLASMA  FOLATE  CORTISOL   ____________________________________________  EKG  ED ECG REPORT I, Nita Sickle, the attending physician, personally viewed and interpreted this ECG.  Normal sinus rhythm, rate of 70, normal intervals, normal axis, no ST elevations or depressions. ____________________________________________  RADIOLOGY  I have personally reviewed the images performed during this visit and I agree with the Radiologist's read.   Interpretation by Radiologist:  Dg Abdomen Acute W/chest  Result Date: 05/25/2018 CLINICAL DATA:  Hypertension.  Chest and abdominal pain. EXAM: DG ABDOMEN ACUTE W/ 1V CHEST COMPARISON:  Abdominal and chest x-rays dated March 26, 2018. FINDINGS: The cardiomediastinal silhouette is normal in size. Normal pulmonary vascularity. Low lung volumes with mild bibasilar atelectasis. No focal consolidation, pleural effusion, or pneumothorax. There are few mildly dilated loops of small bowel in the central and left abdomen. No air-fluid levels. No pneumoperitoneum. No acute osseous abnormality. IMPRESSION: 1. There are few mildly dilated loops of small bowel in the central and left abdomen which could reflect early or partial obstruction versus ileus. 2. Low lung volumes with mild bibasilar atelectasis. No active cardiopulmonary disease. Electronically Signed   By: Obie Dredge M.D.   On: 05/25/2018 22:31   Ct Angio Chest/abd/pel  For Dissection W  And/or Wo Contrast  Result Date: 05/26/2018 CLINICAL DATA:  Hypotension. EXAM: CT ANGIOGRAPHY CHEST, ABDOMEN AND PELVIS TECHNIQUE: Multidetector CT imaging through the chest, abdomen and pelvis was performed using the standard protocol during bolus administration of intravenous contrast. Multiplanar reconstructed images and MIPs were obtained and reviewed to evaluate the vascular anatomy. CONTRAST:  75mL OMNIPAQUE IOHEXOL 350 MG/ML SOLN COMPARISON:  Chest CT 03/19/2018 CT abdomen and pelvis 03/18/2018. FINDINGS: CTA CHEST FINDINGS Cardiovascular: Heart is normal size. Aorta is normal caliber. No evidence of aortic dissection. Moderate soft plaque within the proximal left subclavian artery causing greater than 50% luminal narrowing. Mediastinum/Nodes: No mediastinal, hilar, or axillary adenopathy. Lungs/Pleura: Dependent atelectasis in the lower lobes. No confluent opacities or effusions. Musculoskeletal: Chest wall soft tissues are unremarkable. No acute bony abnormality. Review of the MIP images confirms the above findings. CTA ABDOMEN AND PELVIS FINDINGS VASCULAR Aorta: Normal caliber aorta without aneurysm, dissection, vasculitis or significant stenosis. Celiac: Patent without evidence of aneurysm, dissection, vasculitis or significant stenosis. SMA: Patent without evidence of aneurysm, dissection, vasculitis or significant stenosis. Renals: Both renal arteries are patent without evidence of aneurysm, dissection, vasculitis, fibromuscular dysplasia or significant stenosis. IMA: Patent without evidence of aneurysm, dissection, vasculitis or significant stenosis. Inflow: Patent without evidence of aneurysm, dissection, vasculitis or significant stenosis. Veins: No obvious venous abnormality within the limitations of this arterial phase study. Review of the MIP images confirms the above findings. NON-VASCULAR Hepatobiliary: No focal hepatic abnormality.  Prior cholecystectomy Pancreas: No focal  abnormality or ductal dilatation. Spleen: No focal abnormality.  Normal size. Adrenals/Urinary Tract: No adrenal abnormality. No focal renal abnormality. No stones or hydronephrosis. Urinary bladder is unremarkable. Stomach/Bowel: Normal appendix. Stomach, large and small bowel grossly unremarkable. Lymphatic: Shotty retroperitoneal lymph nodes, none pathologically enlarged. Reproductive: Prior hysterectomy.  No adnexal masses. Other: No free fluid or free air. Musculoskeletal: No acute bony abnormality. Degenerative disc disease in the lower lumbar spine. Review of the MIP images confirms the above findings. IMPRESSION: No evidence of aortic aneurysm or dissection. Moderate soft plaque in the proximal left subclavian artery causing greater than 50% luminal stenosis. No acute cardiopulmonary disease. Dependent atelectasis in the lower lobes. No acute findings in the abdomen or pelvis. Electronically Signed   By: Charlett Nose M.D.   On: 05/26/2018 00:35      ____________________________________________   PROCEDURES  Procedure(s) performed: None Procedures Critical Care performed: yes  CRITICAL CARE Performed by: Nita Sickle  ?  Total critical care time: 30 min  Critical care time was exclusive of separately billable procedures and treating other patients.  Critical care was necessary to treat or prevent imminent or life-threatening deterioration.  Critical care was time spent personally by me on the following activities: development of treatment plan with patient and/or surrogate as well as nursing, discussions with consultants, evaluation of patient's response to treatment, examination of patient, obtaining history from patient or surrogate, ordering and performing treatments and interventions, ordering and review of laboratory studies, ordering and review of radiographic studies, pulse oximetry and re-evaluation of patient's condition.  ____________________________________________    INITIAL IMPRESSION / ASSESSMENT AND PLAN / ED COURSE  43 y.o. 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 Severely hypotensive per EMS and on arrival.  Ddx sepsis, dehydration, AKI, ACS  Patient is well-appearing with normal mental status, blood pressure is 74/51 with  no tachycardia or fever, she has mildly diffuse tenderness to palpation in her abdomen, lungs are clear to auscultation.  Will start IV fluids, get EKG, labs, x-ray, urinalysis   ED Course: Labs consistent with AKI. Patient's BP still in the 70s after 1.75L of IVF. Good mentation, normal HR. Currently on 3rd bolus. XR concerning for possible developing SBO. CT pending. Discussed with Dr. Anne HahnWillis for admission. Care transferred to Dr. York CeriseForbach at the end of my shift at 11PM pending CT.   As part of my medical decision making, I reviewed the following data within the electronic MEDICAL RECORD NUMBER Nursing notes reviewed and incorporated, Labs reviewed , EKG interpreted , Old chart reviewed, Radiograph reviewed , Discussed with admitting physician , Notes from prior ED visits and [place] Controlled Substance Database    Pertinent labs & imaging results that were available during my care of the patient were reviewed by me and considered in my medical decision making (see chart for details).    ____________________________________________   FINAL CLINICAL IMPRESSION(S) / ED DIAGNOSES  Final diagnoses:  Hypotension, unspecified hypotension type  Anemia, unspecified type  Acute kidney injury (HCC)      NEW MEDICATIONS STARTED DURING THIS VISIT:  ED Discharge Orders    None       Note:  This document was prepared using Dragon voice recognition software and may include unintentional dictation errors.     Don PerkingVeronese, [place], MD 05/26/18 [phone]

## 2018-05-26 ENCOUNTER — Encounter: Payer: Self-pay | Admitting: Internal Medicine

## 2018-05-26 ENCOUNTER — Emergency Department: Payer: Medicaid Other

## 2018-05-26 DIAGNOSIS — I708 Atherosclerosis of other arteries: Secondary | ICD-10-CM

## 2018-05-26 DIAGNOSIS — K219 Gastro-esophageal reflux disease without esophagitis: Secondary | ICD-10-CM

## 2018-05-26 DIAGNOSIS — I771 Stricture of artery: Secondary | ICD-10-CM | POA: Diagnosis present

## 2018-05-26 DIAGNOSIS — E119 Type 2 diabetes mellitus without complications: Secondary | ICD-10-CM

## 2018-05-26 DIAGNOSIS — I959 Hypotension, unspecified: Secondary | ICD-10-CM | POA: Diagnosis present

## 2018-05-26 DIAGNOSIS — R5383 Other fatigue: Secondary | ICD-10-CM

## 2018-05-26 LAB — CBC
HCT: 26.5 % — ABNORMAL LOW (ref 36.0–46.0)
Hemoglobin: 7.7 g/dL — ABNORMAL LOW (ref 12.0–15.0)
MCH: 22.6 pg — ABNORMAL LOW (ref 26.0–34.0)
MCHC: 29.1 g/dL — ABNORMAL LOW (ref 30.0–36.0)
MCV: 77.7 fL — ABNORMAL LOW (ref 80.0–100.0)
Platelets: 333 10*3/uL (ref 150–400)
RBC: 3.41 MIL/uL — ABNORMAL LOW (ref 3.87–5.11)
RDW: 15.3 % (ref 11.5–15.5)
WBC: 9.2 10*3/uL (ref 4.0–10.5)
nRBC: 0 % (ref 0.0–0.2)

## 2018-05-26 LAB — RETICULOCYTES
Immature Retic Fract: 24.1 % — ABNORMAL HIGH (ref 2.3–15.9)
RBC.: 3.4 MIL/uL — ABNORMAL LOW (ref 3.87–5.11)
Retic Count, Absolute: 49.3 10*3/uL (ref 19.0–186.0)
Retic Ct Pct: 1.5 % (ref 0.4–3.1)

## 2018-05-26 LAB — BASIC METABOLIC PANEL
Anion gap: 9 (ref 5–15)
BUN: 18 mg/dL (ref 6–20)
CO2: 23 mmol/L (ref 22–32)
Calcium: 8.4 mg/dL — ABNORMAL LOW (ref 8.9–10.3)
Chloride: 106 mmol/L (ref 98–111)
Creatinine, Ser: 1.33 mg/dL — ABNORMAL HIGH (ref 0.44–1.00)
GFR calc Af Amer: 56 mL/min — ABNORMAL LOW (ref >60)
GFR calc non Af Amer: 48 mL/min — ABNORMAL LOW (ref >60)
Glucose, Bld: 258 mg/dL — ABNORMAL HIGH (ref 70–99)
Potassium: 3.3 mmol/L — ABNORMAL LOW (ref 3.5–5.1)
Sodium: 138 mmol/L (ref 135–145)

## 2018-05-26 LAB — VITAMIN B12: Vitamin B-12: 146 pg/mL — ABNORMAL LOW (ref 180–914)

## 2018-05-26 LAB — URINALYSIS, COMPLETE (UACMP) WITH MICROSCOPIC
Bacteria, UA: NONE SEEN
Bilirubin Urine: NEGATIVE
Glucose, UA: NEGATIVE mg/dL
Hgb urine dipstick: NEGATIVE
Ketones, ur: NEGATIVE mg/dL
Leukocytes,Ua: NEGATIVE
Nitrite: NEGATIVE
Protein, ur: NEGATIVE mg/dL
Specific Gravity, Urine: 1.006 (ref 1.005–1.030)
pH: 6 (ref 5.0–8.0)

## 2018-05-26 LAB — GLUCOSE, CAPILLARY
Glucose-Capillary: 169 mg/dL — ABNORMAL HIGH (ref 70–99)
Glucose-Capillary: 196 mg/dL — ABNORMAL HIGH (ref 70–99)
Glucose-Capillary: 205 mg/dL — ABNORMAL HIGH (ref 70–99)
Glucose-Capillary: 146 mg/dL — ABNORMAL HIGH (ref 70–99)
Glucose-Capillary: 152 mg/dL — ABNORMAL HIGH (ref 70–99)

## 2018-05-26 LAB — IRON AND TIBC
Iron: 11 ug/dL — ABNORMAL LOW (ref 28–170)
Saturation Ratios: 2 % — ABNORMAL LOW (ref 10.4–31.8)
TIBC: 600 ug/dL — ABNORMAL HIGH (ref 250–450)
UIBC: 589 ug/dL

## 2018-05-26 LAB — FERRITIN: Ferritin: 3 ng/mL — ABNORMAL LOW (ref 11–307)

## 2018-05-26 LAB — CORTISOL: Cortisol, Plasma: 2.2 ug/dL

## 2018-05-26 LAB — FOLATE: Folate: 11.7 ng/mL (ref >5.9)

## 2018-05-26 MED ORDER — MIDODRINE HCL 5 MG PO TABS
10.0000 mg | ORAL_TABLET | Freq: Three times a day (TID) | ORAL | Status: DC
  Administered 2018-05-26 – 2018-05-27 (×5): 10 mg via ORAL
  Filled 2018-05-26 (×8): qty 2

## 2018-05-26 MED ORDER — FENOFIBRATE 160 MG PO TABS
160.0000 mg | ORAL_TABLET | Freq: Every day | ORAL | Status: DC
  Administered 2018-05-26 – 2018-05-28 (×3): 160 mg via ORAL
  Filled 2018-05-26 (×3): qty 1

## 2018-05-26 MED ORDER — PANTOPRAZOLE SODIUM 40 MG PO TBEC: 40.0000 mg | DELAYED_RELEASE_TABLET | Freq: Every day | ORAL | Status: DC

## 2018-05-26 MED ORDER — PANCRELIPASE (LIP-PROT-AMYL) 12000-38000 UNITS PO CPEP
24000.0000 [IU] | ORAL_CAPSULE | Freq: Three times a day (TID) | ORAL | Status: DC
  Administered 2018-05-26 – 2018-05-28 (×6): 24000 [IU] via ORAL
  Filled 2018-05-26 (×8): qty 2

## 2018-05-26 MED ORDER — ACETAMINOPHEN 650 MG RE SUPP: 650.0000 mg | Freq: Four times a day (QID) | RECTAL | Status: DC | PRN

## 2018-05-26 MED ORDER — SODIUM CHLORIDE 0.9 % IV SOLN
INTRAVENOUS | Status: AC
  Administered 2018-05-26: 03:00:00 via INTRAVENOUS

## 2018-05-26 MED ORDER — APIXABAN 5 MG PO TABS
5.0000 mg | ORAL_TABLET | Freq: Two times a day (BID) | ORAL | Status: DC
  Administered 2018-05-26 – 2018-05-28 (×4): 5 mg via ORAL
  Filled 2018-05-26 (×4): qty 1

## 2018-05-26 MED ORDER — BUSPIRONE HCL 10 MG PO TABS
10.0000 mg | ORAL_TABLET | Freq: Three times a day (TID) | ORAL | Status: DC
  Administered 2018-05-26 – 2018-05-28 (×6): 10 mg via ORAL
  Filled 2018-05-26 (×8): qty 1

## 2018-05-26 MED ORDER — CLONAZEPAM 1 MG PO TABS
1.0000 mg | ORAL_TABLET | Freq: Three times a day (TID) | ORAL | Status: DC
  Administered 2018-05-26 – 2018-05-28 (×5): 1 mg via ORAL
  Filled 2018-05-26 (×5): qty 1

## 2018-05-26 MED ORDER — SENNA 8.6 MG PO TABS
1.0000 | ORAL_TABLET | Freq: Every day | ORAL | Status: DC
  Administered 2018-05-26 – 2018-05-27 (×2): 8.6 mg via ORAL
  Filled 2018-05-26 (×2): qty 1

## 2018-05-26 MED ORDER — PREGABALIN 75 MG PO CAPS
200.0000 mg | ORAL_CAPSULE | Freq: Three times a day (TID) | ORAL | Status: DC
  Administered 2018-05-26 – 2018-05-28 (×6): 200 mg via ORAL
  Filled 2018-05-26 (×7): qty 1

## 2018-05-26 MED ORDER — PROCHLORPERAZINE EDISYLATE 10 MG/2ML IJ SOLN
5.0000 mg | INTRAMUSCULAR | Status: DC | PRN
  Administered 2018-05-26 – 2018-05-27 (×2): 5 mg via INTRAVENOUS
  Filled 2018-05-26 (×3): qty 1

## 2018-05-26 MED ORDER — SODIUM CHLORIDE 0.9 % IV SOLN
INTRAVENOUS | Status: AC
  Administered 2018-05-26 – 2018-05-27 (×4): via INTRAVENOUS

## 2018-05-26 MED ORDER — AMLODIPINE BESYLATE 10 MG PO TABS: 10.0000 mg | ORAL_TABLET | Freq: Every day | ORAL | Status: DC

## 2018-05-26 MED ORDER — VITAMIN D (ERGOCALCIFEROL) 1.25 MG (50000 UNIT) PO CAPS
50000.0000 [IU] | ORAL_CAPSULE | ORAL | Status: DC
  Administered 2018-05-27: 10:00:00 50000 [IU] via ORAL
  Filled 2018-05-26: qty 1

## 2018-05-26 MED ORDER — TIZANIDINE HCL 4 MG PO TABS
4.0000 mg | ORAL_TABLET | Freq: Two times a day (BID) | ORAL | Status: DC | PRN
  Filled 2018-05-26: qty 1

## 2018-05-26 MED ORDER — SODIUM CHLORIDE 0.9 % IV SOLN
300.0000 mg | Freq: Once | INTRAVENOUS | Status: AC
  Administered 2018-05-26: 14:00:00 300 mg via INTRAVENOUS
  Filled 2018-05-26: qty 15

## 2018-05-26 MED ORDER — LORATADINE 10 MG PO TABS
10.0000 mg | ORAL_TABLET | Freq: Every day | ORAL | Status: DC
  Administered 2018-05-27 – 2018-05-28 (×2): 10 mg via ORAL
  Filled 2018-05-26 (×3): qty 1

## 2018-05-26 MED ORDER — ACETAMINOPHEN 325 MG PO TABS
650.0000 mg | ORAL_TABLET | Freq: Four times a day (QID) | ORAL | Status: DC | PRN
  Administered 2018-05-26 – 2018-05-28 (×5): 650 mg via ORAL
  Filled 2018-05-26 (×5): qty 2

## 2018-05-26 MED ORDER — MIRTAZAPINE 15 MG PO TABS
30.0000 mg | ORAL_TABLET | Freq: Every day | ORAL | Status: DC
  Administered 2018-05-26 – 2018-05-27 (×2): 30 mg via ORAL
  Filled 2018-05-26 (×2): qty 2

## 2018-05-26 MED ORDER — INSULIN ASPART 100 UNIT/ML ~~LOC~~ SOLN
0.0000 [IU] | Freq: Three times a day (TID) | SUBCUTANEOUS | Status: DC
  Administered 2018-05-26 (×2): 2 [IU] via SUBCUTANEOUS
  Administered 2018-05-26 – 2018-05-27 (×2): 3 [IU] via SUBCUTANEOUS
  Administered 2018-05-27 – 2018-05-28 (×3): 1 [IU] via SUBCUTANEOUS
  Administered 2018-05-28: 2 [IU] via SUBCUTANEOUS
  Filled 2018-05-26 (×8): qty 1

## 2018-05-26 MED ORDER — DICYCLOMINE HCL 10 MG PO CAPS
10.0000 mg | ORAL_CAPSULE | Freq: Three times a day (TID) | ORAL | Status: DC | PRN
  Filled 2018-05-26: qty 1

## 2018-05-26 MED ORDER — TRAZODONE HCL 50 MG PO TABS
50.0000 mg | ORAL_TABLET | Freq: Every evening | ORAL | Status: DC | PRN
  Administered 2018-05-26: 03:00:00 50 mg via ORAL
  Filled 2018-05-26: qty 1

## 2018-05-26 MED ORDER — HEPARIN SODIUM (PORCINE) 5000 UNIT/ML IJ SOLN
5000.0000 [IU] | Freq: Three times a day (TID) | INTRAMUSCULAR | Status: DC
  Administered 2018-05-26 (×2): 5000 [IU] via SUBCUTANEOUS
  Filled 2018-05-26 (×2): qty 1

## 2018-05-26 MED ORDER — PANTOPRAZOLE SODIUM 40 MG PO TBEC
40.0000 mg | DELAYED_RELEASE_TABLET | Freq: Two times a day (BID) | ORAL | Status: DC
  Administered 2018-05-26 – 2018-05-28 (×5): 40 mg via ORAL
  Filled 2018-05-26 (×5): qty 1

## 2018-05-26 MED ORDER — ACETAMINOPHEN 325 MG PO TABS: 650.0000 mg | ORAL_TABLET | Freq: Four times a day (QID) | ORAL | Status: DC | PRN

## 2018-05-26 MED ORDER — ATORVASTATIN CALCIUM 20 MG PO TABS
40.0000 mg | ORAL_TABLET | Freq: Every day | ORAL | Status: DC
  Administered 2018-05-26 – 2018-05-28 (×3): 40 mg via ORAL
  Filled 2018-05-26 (×3): qty 2

## 2018-05-26 MED ORDER — FLUOXETINE HCL 20 MG PO CAPS
40.0000 mg | ORAL_CAPSULE | Freq: Every day | ORAL | Status: DC
  Administered 2018-05-26 – 2018-05-28 (×3): 40 mg via ORAL
  Filled 2018-05-26 (×3): qty 2

## 2018-05-26 MED ORDER — INSULIN ASPART 100 UNIT/ML ~~LOC~~ SOLN: 0.0000 [IU] | Freq: Every day | SUBCUTANEOUS | Status: DC

## 2018-05-26 NOTE — Progress Notes (Signed)
[employer] - [place] at [facility]                                                                                                                                                                                  Patient Demographics   Terri Berry, is a [age] y.o. female, DOB - [birth year], WGN:[id number]  Admit date - 05/25/2018   Admitting Physician Terri Manisavid Willis, MD  Outpatient Primary MD for the patient is Terri DinningWeeks, Cynthia, MD   LOS - 0  Subjective: Patient states that she is feeling weak continues to have low blood pressure    Review of Systems:   CONSTITUTIONAL: No documented fever.  Positive fatigue, positive weakness. No weight gain, no weight loss.  EYES: No blurry or double vision.  ENT: No tinnitus. No postnasal drip. No redness of the oropharynx.  RESPIRATORY: No cough, no wheeze, no hemoptysis. No dyspnea.  CARDIOVASCULAR: No chest pain. No orthopnea. No palpitations. No syncope.  GASTROINTESTINAL: No nausea, no vomiting or diarrhea. No abdominal pain. No melena or hematochezia.  GENITOURINARY: No dysuria or hematuria.  ENDOCRINE: No polyuria or nocturia. No heat or cold intolerance.  HEMATOLOGY: No anemia. No bruising. No bleeding.  INTEGUMENTARY: Chronic right toe infection MUSCULOSKELETAL: No arthritis. No swelling. No gout.  NEUROLOGIC: No numbness, tingling, or ataxia. No seizure-type activity.  PSYCHIATRIC: No anxiety. No insomnia. No ADD.    Vitals:   Vitals:   05/26/18 0217 05/26/18 0500 05/26/18 0635 05/26/18 1156  BP: 105/75 (!) 90/59  116/82  Pulse: 72 76 79 71  Resp: 19 (!) 21    Temp: 97.9 F (36.6 C) 98.1 F (36.7 C)  97.7 F (36.5 C)  TempSrc: Oral Oral  Oral  SpO2: 100% 99% 95% 99%  Weight:      Height:        Wt Readings from Last 3 Encounters:  05/25/18 90.7 kg  05/01/18 90.7 kg  03/27/18 99.3 kg     Intake/Output Summary (Last 24 hours) at 05/26/2018 1306 Last data filed at 05/26/2018 0500 Gross per 24 hour  Intake  1559.85 ml  Output 300 ml  Net 1259.85 ml    Physical Exam:   GENERAL: Pleasant-appearing in no apparent distress.  HEAD, EYES, EARS, NOSE AND THROAT: Atraumatic, normocephalic. Extraocular muscles are intact. Pupils equal and reactive to light. Sclerae anicteric. No conjunctival injection. No oro-pharyngeal erythema.  NECK: Supple. There is no jugular venous distention. No bruits, no lymphadenopathy, no thyromegaly.  HEART: Regular rate and rhythm,. No murmurs, no rubs, no clicks.  LUNGS: Clear to auscultation bilaterally. No rales or rhonchi. No wheezes.  ABDOMEN: Soft, flat, nontender, nondistended. Has good bowel sounds. No hepatosplenomegaly  appreciated.  EXTREMITIES:             NEUROLOGIC: The patient is alert, awake, and oriented x3 with no focal motor or sensory deficits appreciated bilaterally.  SKIN: Moist and warm with no rashes appreciated.  Psych: Not anxious, depressed LN: No inguinal LN enlargement    Antibiotics   Anti-infectives (From admission, onward)   None      Medications   Scheduled Meds:  heparin  5,000 Units Subcutaneous Q8H   insulin aspart  0-5 Units Subcutaneous QHS   insulin aspart  0-9 Units Subcutaneous TID WC   loratadine  10 mg Oral Daily   midodrine  10 mg Oral TID WC   pantoprazole  40 mg Oral BID AC   Continuous Infusions:  [START ON 05/27/2018] sodium chloride 125 mL/hr at 05/26/18 1211   iron sucrose     PRN Meds:.acetaminophen **OR** acetaminophen, prochlorperazine, traZODone   Data Review:   Micro Results No results found for this or any previous visit (from the past 240 hour(s)).  Radiology Reports Dg Abdomen Acute W/chest  Result Date: 05/25/2018 CLINICAL DATA:  Hypertension.  Chest and abdominal pain. EXAM: DG ABDOMEN ACUTE W/ 1V CHEST COMPARISON:  Abdominal and chest x-rays dated March 26, 2018. FINDINGS: The cardiomediastinal silhouette is normal in size. Normal pulmonary vascularity. Low lung  volumes with mild bibasilar atelectasis. No focal consolidation, pleural effusion, or pneumothorax. There are few mildly dilated loops of small bowel in the central and left abdomen. No air-fluid levels. No pneumoperitoneum. No acute osseous abnormality. IMPRESSION: 1. There are few mildly dilated loops of small bowel in the central and left abdomen which could reflect early or partial obstruction versus ileus. 2. Low lung volumes with mild bibasilar atelectasis. No active cardiopulmonary disease. Electronically Signed   By: Obie Dredge M.D.   On: 05/25/2018 22:31   Ct Angio Chest/abd/pel For Dissection W And/or Wo Contrast  Result Date: 05/26/2018 CLINICAL DATA:  Hypotension. EXAM: CT ANGIOGRAPHY CHEST, ABDOMEN AND PELVIS TECHNIQUE: Multidetector CT imaging through the chest, abdomen and pelvis was performed using the standard protocol during bolus administration of intravenous contrast. Multiplanar reconstructed images and MIPs were obtained and reviewed to evaluate the vascular anatomy. CONTRAST:  75mL OMNIPAQUE IOHEXOL 350 MG/ML SOLN COMPARISON:  Chest CT 03/19/2018 CT abdomen and pelvis 03/18/2018. FINDINGS: CTA CHEST FINDINGS Cardiovascular: Heart is normal size. Aorta is normal caliber. No evidence of aortic dissection. Moderate soft plaque within the proximal left subclavian artery causing greater than 50% luminal narrowing. Mediastinum/Nodes: No mediastinal, hilar, or axillary adenopathy. Lungs/Pleura: Dependent atelectasis in the lower lobes. No confluent opacities or effusions. Musculoskeletal: Chest wall soft tissues are unremarkable. No acute bony abnormality. Review of the MIP images confirms the above findings. CTA ABDOMEN AND PELVIS FINDINGS VASCULAR Aorta: Normal caliber aorta without aneurysm, dissection, vasculitis or significant stenosis. Celiac: Patent without evidence of aneurysm, dissection, vasculitis or significant stenosis. SMA: Patent without evidence of aneurysm, dissection,  vasculitis or significant stenosis. Renals: Both renal arteries are patent without evidence of aneurysm, dissection, vasculitis, fibromuscular dysplasia or significant stenosis. IMA: Patent without evidence of aneurysm, dissection, vasculitis or significant stenosis. Inflow: Patent without evidence of aneurysm, dissection, vasculitis or significant stenosis. Veins: No obvious venous abnormality within the limitations of this arterial phase study. Review of the MIP images confirms the above findings. NON-VASCULAR Hepatobiliary: No focal hepatic abnormality.  Prior cholecystectomy Pancreas: No focal abnormality or ductal dilatation. Spleen: No focal abnormality.  Normal size. Adrenals/Urinary Tract: No adrenal abnormality. No focal  renal abnormality. No stones or hydronephrosis. Urinary bladder is unremarkable. Stomach/Bowel: Normal appendix. Stomach, large and small bowel grossly unremarkable. Lymphatic: Shotty retroperitoneal lymph nodes, none pathologically enlarged. Reproductive: Prior hysterectomy.  No adnexal masses. Other: No free fluid or free air. Musculoskeletal: No acute bony abnormality. Degenerative disc disease in the lower lumbar spine. Review of the MIP images confirms the above findings. IMPRESSION: No evidence of aortic aneurysm or dissection. Moderate soft plaque in the proximal left subclavian artery causing greater than 50% luminal stenosis. No acute cardiopulmonary disease. Dependent atelectasis in the lower lobes. No acute findings in the abdomen or pelvis. Electronically Signed   By: Charlett Nose M.D.   On: 05/26/2018 00:35     CBC Recent Labs  Lab 05/25/18 2152 05/26/18 0355  WBC 11.6* 9.2  HGB 8.0* 7.7*  HCT 27.3* 26.5*  PLT 374 333  MCV 76.7* 77.7*  MCH 22.5* 22.6*  MCHC 29.3* 29.1*  RDW 15.2 15.3  LYMPHSABS 3.5  --   MONOABS 1.0  --   EOSABS 0.1  --   BASOSABS 0.1  --     Chemistries  Recent Labs  Lab 05/25/18 2152 05/26/18 0355  NA 137 138  K 3.2* 3.3*  CL 101  106  CO2 23 23  GLUCOSE 141* 258*  BUN 19 18  CREATININE 1.52* 1.33*  CALCIUM 8.5* 8.4*  AST 15  --   ALT 11  --   ALKPHOS 54  --   BILITOT <0.1*  --    ------------------------------------------------------------------------------------------------------------------ estimated creatinine clearance is 62.4 mL/min (A) (by C-G formula based on SCr of 1.33 mg/dL (H)). ------------------------------------------------------------------------------------------------------------------ No results for input(s): HGBA1C in the last 72 hours. ------------------------------------------------------------------------------------------------------------------ No results for input(s): CHOL, HDL, LDLCALC, TRIG, CHOLHDL, LDLDIRECT in the last 72 hours. ------------------------------------------------------------------------------------------------------------------ No results for input(s): TSH, T4TOTAL, T3FREE, THYROIDAB in the last 72 hours.  Invalid input(s): FREET3 ------------------------------------------------------------------------------------------------------------------ Recent Labs    05/26/18 0355  VITAMINB12 146*  FOLATE 11.7  FERRITIN 3*  TIBC 600*  IRON 11*  RETICCTPCT 1.5    Coagulation profile No results for input(s): INR, PROTIME in the last 168 hours.  No results for input(s): DDIMER in the last 72 hours.  Cardiac Enzymes Recent Labs  Lab 05/25/18 2152  TROPONINI <0.03   ------------------------------------------------------------------------------------------------------------------ Invalid input(s): POCBNP    Assessment & Plan  Patient is 43 year old presenting with hypotension and acute kidney injury    AKI (acute kidney injury) (HCC) -IV fluids have expired I will resume IV fluids at a high rate  Hypotension etiology unclear lactic acid is normal.  I will check cortisol level Give aggressive IV fluids Start patient on midodrine  Right great toe possible  infection we will treat her with oral antibiotics for now  Anemia work-up shows iron deficiency we will give her IV iron follow CBC Guaiac stools  Subclavian arterial stenosis (HCC) -patient denies any symptoms, Vascular surgery has been consulted    Diabetes (HCC) -sliding scale insulin coverage    GERD (gastroesophageal reflux disease) -home dose PPI    Anxiety -continue trazodone     Code Status Orders  (From admission, onward)         Start     Ordered   05/26/18 0212  Full code  Continuous     05/26/18 0211        Code Status History    Date Active Date Inactive Code Status Order ID Comments User Context   03/18/2018 1409 03/27/2018 2130 Full Code [id number]  Sudini,  Srikar, MD ED   02/09/2018 0921 02/15/2018 1549 Full Code [id number]  Barbaraann Rondo, MD Inpatient   11/28/2017 0352 12/04/2017 2009 Full Code [id number]  Barbaraann Rondo, MD Inpatient           Consults  Vascular surgery   DVT Prophylaxis Heparin  Lab Results  Component Value Date   PLT 333 05/26/2018     Time Spent in minutes 45 minutes greater than 50% of time spent in care coordination and counseling patient regarding the condition and plan of care. [phone] am   Auburn Bilberry M.D on 05/26/2018 at 1:06 PM  Between 7am to 6pm - Pager - [phone]  After 6pm go to [email or web address] - Social research officer, government  [employer]   Office  [phone]

## 2018-05-26 NOTE — ED Notes (Signed)
Patient has sore to bottom of right big toe. Area is covered with a 2x2 gauze and tape to secure.

## 2018-05-26 NOTE — Progress Notes (Signed)
Admitted. Oriented to bed controls and admission packet. clonazepam home medication sent to pharmacy to be stored. Patient agrees and has receipt. Long hair very matted, patient says this happened while an inpatient at [facility] and she just hasn't gotten to hairdresser.

## 2018-05-26 NOTE — H&P (Signed)
[facility] [place] [facility]   PATIENT NAME: Terri LotDiane Tolle    MR#:  [id number]  DATE OF BIRTH:  1975/05/03  DATE OF ADMISSION:  05/25/2018  PRIMARY CARE PHYSICIAN: Thomes DinningWeeks, Cynthia, MD   REQUESTING/REFERRING PHYSICIAN: Don PerkingVeronese, MD  CHIEF COMPLAINT:   Chief Complaint  Patient presents with  . Chest Pain  . Hypotension    HISTORY OF PRESENT ILLNESS:  Terri Berry  is a 43 y.o. female who presents with chief complaint as above.  Patient presents to the ED via EMS after an episode of hypotension at home.  Patient states that she became lightheaded, and then fell.  She denies full loss of consciousness.  When EMS arrived to her house her blood pressure was in the 60s systolic.  She was given 750 cc of normal saline in route to the hospital, and then another liter here in the ED.  Her blood pressure improved to 90s/low 100s systolic.  Work-up here in the ED is largely unrevealing.  Patient has some AKI.  She has some chronic anemia which is stable.  She is not complaining of any symptoms other than some central chest discomfort.  Troponin was normal, EKG was normal.  CTA chest abdomen and pelvis did not reveal any significant abnormality except for some left subclavian stenosis.  Hospitalist were called for admission and further evaluation  PAST MEDICAL HISTORY:   Past Medical History:  Diagnosis Date  . Allergy   . Anxiety   . Depression   . Diabetes (HCC)   . GERD (gastroesophageal reflux disease)   . GERD (gastroesophageal reflux disease)   . Hypertension   . IBS (irritable bowel syndrome)      PAST SURGICAL HISTORY:   Past Surgical History:  Procedure Laterality Date  . ABDOMINAL HYSTERECTOMY    . GALLBLADDER SURGERY       SOCIAL HISTORY:   Social History   Tobacco Use  . Smoking status: Current Every Day Smoker    Packs/day: 0.50    Types: Cigarettes  . Smokeless tobacco: Never Used  Substance Use Topics  . Alcohol use: Not  Currently     FAMILY HISTORY:   Family History  Problem Relation Age of Onset  . Diabetes Father   . Heart disease Father   . Kidney disease Father   . COPD Father   . Cancer Paternal Aunt   . Diabetes Paternal Uncle   . Heart disease Paternal Uncle      DRUG ALLERGIES:   Allergies  Allergen Reactions  . Precedex [Dexmedetomidine Hcl In Nacl] Other (See Comments)    Significant Bradycardia  . Cefpodoxime     Tolerates augmentin and Keflex   . Clarithromycin   . Doxycycline   . Ferrous Gluconate   . Lactose Intolerance (Gi)   . Levofloxacin   . Rizatriptan   . Sulfa Antibiotics   . Topiramate   . Venlafaxine   . Zofran [Ondansetron Hcl]     MEDICATIONS AT HOME:   Prior to Admission medications   Medication Sig Start Date End Date Taking? Authorizing Provider  acetaminophen (TYLENOL) 325 MG tablet Take 2 tablets (650 mg total) by mouth every 6 (six) hours as needed for mild pain (or Fever >/= 101). 03/27/18   Mayo, Allyn KennerKaty Dodd, MD  albuterol (PROVENTIL HFA;VENTOLIN HFA) 108 (90 Base) MCG/ACT inhaler Inhale 2 puffs into the lungs every 6 (six) hours as needed for wheezing or shortness of breath. 02/15/18   Shaune Pollackhen, Qing, MD  fluconazole (DIFLUCAN) 400-0.9 MG/200ML-% IVPB Inject 200 mLs (400 mg total) into the vein daily. 03/28/18   Mayo, Allyn Kenner, MD  heparin 25000-0.45 UT/250ML-% infusion Inject 2,700 Units/hr into the vein continuous. 03/27/18   Mayo, Allyn Kenner, MD  hydrALAZINE (APRESOLINE) 20 MG/ML injection Inject 0.5 mLs (10 mg total) into the vein every 4 (four) hours as needed (SBP > 160, DBP > 110). 03/27/18   Mayo, Allyn Kenner, MD  insulin aspart (NOVOLOG) 100 UNIT/ML injection Inject 0-20 Units into the skin every 4 (four) hours. 03/27/18   Mayo, Allyn Kenner, MD  insulin glargine (LANTUS) 100 UNIT/ML injection Inject 0.15 mLs (15 Units total) into the skin daily. 03/28/18   Mayo, Allyn Kenner, MD  ketorolac (TORADOL) 10 MG tablet Take 1 tablet (10 mg total) by mouth every 6  (six) hours as needed. 05/01/18   Darci Current, MD  labetalol (NORMODYNE,TRANDATE) 5 MG/ML injection Inject 2 mLs (10 mg total) into the vein every 2 (two) hours as needed (SBP > 160, DBP > 100). 03/27/18   Mayo, Allyn Kenner, MD  LORazepam (ATIVAN) 2 MG/ML injection Inject 0.5 mLs (1 mg total) into the vein every 4 (four) hours as needed for anxiety. 03/27/18   Mayo, Allyn Kenner, MD  meropenem 1 g in sodium chloride 0.9 % 100 mL Inject 1 g into the vein every 8 (eight) hours. 03/28/18   Mayo, Allyn Kenner, MD  metoCLOPramide (REGLAN) 5 MG/ML injection Inject 2 mLs (10 mg total) into the vein every 6 (six) hours as needed. 03/27/18   Mayo, Allyn Kenner, MD  morphine 2 MG/ML injection Inject 1 mL (2 mg total) into the vein every 2 (two) hours as needed. 03/27/18   Mayo, Allyn Kenner, MD  omeprazole (PRILOSEC) 20 MG capsule Take 40 mg by mouth 2 (two) times daily before a meal.    [provider]  polyethylene glycol (MIRALAX / GLYCOLAX) packet Take 17 g by mouth 2 (two) times daily.    [provider]  promethazine (PHENERGAN) 25 MG/ML injection Inject 0.5 mLs (12.5 mg total) into the vein every 6 (six) hours as needed for nausea or vomiting. 03/27/18   Mayo, Allyn Kenner, MD    REVIEW OF SYSTEMS:  Review of Systems  Constitutional: Negative for chills, fever, malaise/fatigue and weight loss.  HENT: Negative for ear pain, hearing loss and tinnitus.   Eyes: Negative for blurred vision, double vision, pain and redness.  Respiratory: Negative for cough, hemoptysis and shortness of breath.   Cardiovascular: Negative for chest pain, palpitations, orthopnea and leg swelling.       Central chest discomfort  Gastrointestinal: Negative for abdominal pain, constipation, diarrhea, nausea and vomiting.  Genitourinary: Negative for dysuria, frequency and hematuria.  Musculoskeletal: Positive for falls. Negative for back pain, joint pain and neck pain.  Skin:       No acne, rash, or lesions  Neurological:  Positive for headaches. Negative for dizziness, tremors, focal weakness and weakness.  Endo/Heme/Allergies: Negative for polydipsia. Does not bruise/bleed easily.  Psychiatric/Behavioral: Negative for depression. The patient is not nervous/anxious and does not have insomnia.      VITAL SIGNS:   Vitals:   05/25/18 2149 05/25/18 2153 05/25/18 2156 05/25/18 2200  BP:  (!) 74/51  (!) 72/56  Pulse:  69  70  Resp:  16  11  Temp:  98.1 F (36.7 C)    TempSrc:  Oral    SpO2: 96% 98%  98%  Weight:   90.7 kg  Height:   5\' 7"  (1.702 m)    Wt Readings from Last 3 Encounters:  05/25/18 90.7 kg  05/01/18 90.7 kg  03/27/18 99.3 kg    PHYSICAL EXAMINATION:  Physical Exam  Vitals reviewed. Constitutional: She is oriented to person, place, and time. She appears well-developed and well-nourished. No distress.  HENT:  Head: Normocephalic and atraumatic.  Mouth/Throat: Oropharynx is clear and moist.  Eyes: Pupils are equal, round, and reactive to light. Conjunctivae and EOM are normal. No scleral icterus.  Neck: Normal range of motion. Neck supple. No JVD present. No thyromegaly present.  Cardiovascular: Normal rate, regular rhythm and intact distal pulses. Exam reveals no gallop and no friction rub.  No murmur heard. Respiratory: Effort normal and breath sounds normal. No respiratory distress. She has no wheezes. She has no rales.  GI: Soft. Bowel sounds are normal. She exhibits no distension. There is no abdominal tenderness.  Musculoskeletal: Normal range of motion.        General: No edema.     Comments: No arthritis, no gout  Lymphadenopathy:    She has no cervical adenopathy.  Neurological: She is alert and oriented to person, place, and time. No cranial nerve deficit.  No dysarthria, no aphasia  Skin: Skin is warm and dry. No rash noted. No erythema.  Psychiatric: She has a normal mood and affect. Her behavior is normal. Judgment and thought content normal.    LABORATORY PANEL:    CBC Recent Labs  Lab 05/25/18 2152  WBC 11.6*  HGB 8.0*  HCT 27.3*  PLT 374   ------------------------------------------------------------------------------------------------------------------  Chemistries  Recent Labs  Lab 05/25/18 2152  NA 137  K 3.2*  CL 101  CO2 23  GLUCOSE 141*  BUN 19  CREATININE 1.52*  CALCIUM 8.5*  AST 15  ALT 11  ALKPHOS 54  BILITOT <0.1*   ------------------------------------------------------------------------------------------------------------------  Cardiac Enzymes Recent Labs  Lab 05/25/18 2152  TROPONINI <0.03   ------------------------------------------------------------------------------------------------------------------  RADIOLOGY:  Dg Abdomen Acute W/chest  Result Date: 05/25/2018 CLINICAL DATA:  Hypertension.  Chest and abdominal pain. EXAM: DG ABDOMEN ACUTE W/ 1V CHEST COMPARISON:  Abdominal and chest x-rays dated March 26, 2018. FINDINGS: The cardiomediastinal silhouette is normal in size. Normal pulmonary vascularity. Low lung volumes with mild bibasilar atelectasis. No focal consolidation, pleural effusion, or pneumothorax. There are few mildly dilated loops of small bowel in the central and left abdomen. No air-fluid levels. No pneumoperitoneum. No acute osseous abnormality. IMPRESSION: 1. There are few mildly dilated loops of small bowel in the central and left abdomen which could reflect early or partial obstruction versus ileus. 2. Low lung volumes with mild bibasilar atelectasis. No active cardiopulmonary disease. Electronically Signed   By: Obie Dredge M.D.   On: 05/25/2018 22:31   Ct Angio Chest/abd/pel For Dissection W And/or Wo Contrast  Result Date: 05/26/2018 CLINICAL DATA:  Hypotension. EXAM: CT ANGIOGRAPHY CHEST, ABDOMEN AND PELVIS TECHNIQUE: Multidetector CT imaging through the chest, abdomen and pelvis was performed using the standard protocol during bolus administration of intravenous contrast.  Multiplanar reconstructed images and MIPs were obtained and reviewed to evaluate the vascular anatomy. CONTRAST:  60mL OMNIPAQUE IOHEXOL 350 MG/ML SOLN COMPARISON:  Chest CT 03/19/2018 CT abdomen and pelvis 03/18/2018. FINDINGS: CTA CHEST FINDINGS Cardiovascular: Heart is normal size. Aorta is normal caliber. No evidence of aortic dissection. Moderate soft plaque within the proximal left subclavian artery causing greater than 50% luminal narrowing. Mediastinum/Nodes: No mediastinal, hilar, or axillary adenopathy. Lungs/Pleura: Dependent atelectasis in the  lower lobes. No confluent opacities or effusions. Musculoskeletal: Chest wall soft tissues are unremarkable. No acute bony abnormality. Review of the MIP images confirms the above findings. CTA ABDOMEN AND PELVIS FINDINGS VASCULAR Aorta: Normal caliber aorta without aneurysm, dissection, vasculitis or significant stenosis. Celiac: Patent without evidence of aneurysm, dissection, vasculitis or significant stenosis. SMA: Patent without evidence of aneurysm, dissection, vasculitis or significant stenosis. Renals: Both renal arteries are patent without evidence of aneurysm, dissection, vasculitis, fibromuscular dysplasia or significant stenosis. IMA: Patent without evidence of aneurysm, dissection, vasculitis or significant stenosis. Inflow: Patent without evidence of aneurysm, dissection, vasculitis or significant stenosis. Veins: No obvious venous abnormality within the limitations of this arterial phase study. Review of the MIP images confirms the above findings. NON-VASCULAR Hepatobiliary: No focal hepatic abnormality.  Prior cholecystectomy Pancreas: No focal abnormality or ductal dilatation. Spleen: No focal abnormality.  Normal size. Adrenals/Urinary Tract: No adrenal abnormality. No focal renal abnormality. No stones or hydronephrosis. Urinary bladder is unremarkable. Stomach/Bowel: Normal appendix. Stomach, large and small bowel grossly unremarkable.  Lymphatic: Shotty retroperitoneal lymph nodes, none pathologically enlarged. Reproductive: Prior hysterectomy.  No adnexal masses. Other: No free fluid or free air. Musculoskeletal: No acute bony abnormality. Degenerative disc disease in the lower lumbar spine. Review of the MIP images confirms the above findings. IMPRESSION: No evidence of aortic aneurysm or dissection. Moderate soft plaque in the proximal left subclavian artery causing greater than 50% luminal stenosis. No acute cardiopulmonary disease. Dependent atelectasis in the lower lobes. No acute findings in the abdomen or pelvis. Electronically Signed   By: Charlett Nose M.D.   On: 05/26/2018 00:35    EKG:   Orders placed or performed during the hospital encounter of 05/25/18  . ED EKG  . ED EKG  . EKG 12-Lead  . EKG 12-Lead    IMPRESSION AND PLAN:  Principal Problem:   AKI (acute kidney injury) (HCC) -aggressive IV fluids, avoid nephrotoxins and monitor for expected improvement.  Suspect her AKI is due to her episode of hypotension, which still does not have a very clear etiology.  She may need decrease in her antihypertensive medications Active Problems:   Hypotension -hold home dose antihypertensives.  Patient's blood pressure is improving with IV fluids, continue aggressive IV fluids for now.  Restart home dose antihypertensives only as the patient's blood pressure requires.  She may need to be discharged on fewer antihypertensives with follow-up at PCPs office.  I suggested to the patient that she get a home blood pressure cuff to keep blood pressure logs.   Subclavian arterial stenosis (HCC) -patient denies any symptoms, will get a vascular surgery consult for further recommendation, though I suspect she will not need any intervention and will likely be treated with medical management   Diabetes (HCC) -sliding scale insulin coverage   GERD (gastroesophageal reflux disease) -home dose PPI   Anxiety -trazodone nightly now for anxiety  and sleep, can restart her home dose clonazepam once her blood pressure improves  Chart review performed and case discussed with ED provider. Labs, imaging and/or ECG reviewed by provider and discussed with patient/family. Management plans discussed with the patient and/or family.  DVT PROPHYLAXIS: SubQ heparin  GI PROPHYLAXIS:  PPI   ADMISSION STATUS: Observation  CODE STATUS: Full Code Status History    Date Active Date Inactive Code Status Order ID Comments User Context   03/18/2018 1409 03/27/2018 2130 Full Code [id number]  Milagros Loll, MD ED   02/09/2018 0921 02/15/2018 1549 Full Code [id number]  Barbaraann Rondo,  MD Inpatient   11/28/2017 0352 12/04/2017 2009 Full Code [id number]  Barbaraann Rondo, MD Inpatient      TOTAL TIME TAKING CARE OF THIS PATIENT: 40 minutes.   Barney Drain 05/26/2018, 1:09 AM  [facility]  Office  [phone]  CC: Primary care physician; Thomes Dinning, MD  Note:  This document was prepared using Dragon voice recognition software and may include unintentional dictation errors.

## 2018-05-26 NOTE — Progress Notes (Signed)
   05/26/18 1400  Clinical Encounter Type  Visited With Patient not available  Visit Type Initial  Referral From Chaplain  Consult/Referral To Chaplain  Chaplain was rounding and stop to visit the patient but she was asleep.

## 2018-05-26 NOTE — ED Notes (Signed)
Admitting MD in with patient. 

## 2018-05-26 NOTE — TOC Initial Note (Addendum)
Transition of Care [facility](TOC) - Initial/Assessment Note    Patient Details  Name: Terri Berry MRN: [id number] Date of Birth: 1975-03-15  Transition of Care [facility](TOC) CM/SW Contact:    Eber HongGreene, Tawna Alwin R, RN Phone Number: 05/26/2018, 3:14 PM  Clinical Narrative:                Assess due to frequent ED visit,recent admission and lack of payor source. Moderate risk for readmission.   Most [facility] admission was for acute pancreatitis and was [facility].  She is followed at [facility] by Thomes Dinningynthia Weeks.  Patient receives her medications without [facility].  Says her prescriptions must be signed by [facility] physician.  She lives with her dad who provided financial support, shelter and transportation.  Experiencing hypotension.  Vascular says that patient's blood pressure in left arm can be chronically lower that right due to thrombus.  Recommends blood pressures in right arm only for acu Patient keeps dropping off to sleep during assessment and snoring loudly.  Unable to complete the assessment. Unable to determine if patient has initiated a medicaid/disability application. Left voicemail with financial counselor Elita Booneachel Wade       Patient Goals and CMS Choice Patient states their goals for this hospitalization and ongoing recovery are:: (Patient does not answer. Drowsy and drops off to sleep)      Expected Discharge Plan and Services     Discharge Planning Services: CM Consult   Living arrangements for the past 2 months: Single Family Home                          Prior Living Arrangements/Services Living arrangements for the past 2 months: Single Family Home Lives with:: Relatives(Father)                   Activities of Daily Living Home Assistive Devices/Equipment: None ADL Screening (condition at time of admission) Patient's cognitive ability adequate to safely complete daily activities?: Yes Is the patient deaf or have  difficulty hearing?: No Does the patient have difficulty seeing, even when wearing glasses/contacts?: No Does the patient have difficulty concentrating, remembering, or making decisions?: No Patient able to express need for assistance with ADLs?: Yes Does the patient have difficulty dressing or bathing?: No Independently performs ADLs?: Yes (appropriate for developmental age) Does the patient have difficulty walking or climbing stairs?: No Weakness of Legs: Both Weakness of Arms/Hands: None  Permission Sought/Granted                  Emotional Assessment Appearance:: Disheveled Attitude/Demeanor/Rapport: Lethargic, Sedated Affect (typically observed): Stoic, Withdrawn Orientation: : Oriented to Self, Oriented to Place   Psych Involvement: No (comment)  Admission diagnosis:  Acute kidney injury (HCC) [N17.9] Hypotension, unspecified hypotension type [I95.9] Anemia, unspecified type [D64.9] Patient Active Problem List   Diagnosis Date Noted  . Hypotension 05/26/2018  . Subclavian arterial stenosis (HCC) 05/26/2018  . Diabetes (HCC) 05/26/2018  . GERD (gastroesophageal reflux disease) 05/25/2018  . HTN (hypertension) 05/25/2018  . Anxiety 05/25/2018  . AKI (acute kidney injury) (HCC) 05/25/2018  . Distended abdomen   . Acute pancreatitis 03/18/2018  . Acute hypoxemic respiratory failure (HCC) 02/09/2018  . Overdose 11/28/2017   PCP:  Thomes DinningWeeks, Cynthia, MD Pharmacy:   [facility] [place] - [address] [place] [address] Phone: [phone] Fax: [phone]  [facility] -  [place], [place] - [address] [place] [address] Phone: [phone] Fax: [phone]     Social Determinants of Health (SDOH) Interventions    Readmission Risk Interventions No flowsheet data found.

## 2018-05-26 NOTE — ED Provider Notes (Signed)
-----------------------------------------   12:42 AM on 05/26/2018 -----------------------------------------  I assumed care from Dr. Don Perking to follow-up on the CTA chest/abdomen/pelvis.  There were no acute abnormalities.  The patient remains alert and in no acute distress in spite of persistent hypotension.  The hospitalist, Dr. Anne Hahn, is already aware of the patient and I updated him regarding the results of the scan and the fact that she is ready for admission.   Loleta Rose, MD 05/26/18 [phone]

## 2018-05-26 NOTE — Consult Note (Signed)
WOC Nurse wound consult note Consultation was completed by review of records, images and assistance from the bedside nurse/clinical staff.   Reason for Consult:right great toe wound Patient with history of DM Wound type:full thickness ulceration medial aspect of right great toe Pressure Injury POA: /NA Measurement: did not measure, reviewed images Wound CVU:DTHY pink, some yellow/grey material over 50% of the wound, does not appear to be eschar Drainage (amount, consistency, odor) minimal noted on nursing flow sheet and in image.  Periwound: intact, documented distal pulses WDL Dressing procedure/placement/frequency: Clean toe wound with saline, dry Cover with small piece of silver hydrofiber (Aquacel Ag+) cut to fit.  Wrap with conform and/or dry gauze.  Change every other day.  Follow up with primary MD at DC to monitor status of wound in the presence of DM.  Discussed POC with patient and bedside nurse.  Re consult if needed, will not follow at this time. Thanks  Danny Zimny [employer], RN,CWOCN, CNS, CWON-AP [phone])

## 2018-05-26 NOTE — Consult Note (Signed)
[facility] Vascular Consult Note  MRN : [id number]  Terri Berry is a 43 y.o. (28-Nov-1975) female who presents with chief complaint of  Chief Complaint  Patient presents with  . Chest Pain  . Hypotension  .  History of Present Illness:   I am asked to evaluate the patient by Dr. Anne Hahn for stenosis of the left subclavian artery.  The patient is a 43 year old woman admitted to [facility] earlier this morning after being found down.  Upon entering the room and introducing myself the patient does not respond.  She is laying supine mouth breathing and appears to be quite lethargic.  Upon readdressing her at the head of the bed I get a garbled response.  Patient has multiple recent admissions for various different medical problems.  These of also included acute renal insufficiency which was identified on this admission as well.  This seems to be somewhat of a recurring pattern.  However the patient is not able to give any history on questioning and there is no documentation that her left arm has been giving her any significant problems.  Unfortunately, this is a limited interview given the patient's lethargy   Current Facility-Administered Medications  Medication Dose Route Frequency Provider Last Rate Last Dose  . [START ON 05/27/2018] 0.9 %  sodium chloride infusion   Intravenous Continuous Auburn Bilberry, MD 125 mL/hr at 05/26/18 1211    . acetaminophen (TYLENOL) tablet 650 mg  650 mg Oral Q6H PRN Oralia Manis, MD   650 mg at 05/26/18 1210   Or  . acetaminophen (TYLENOL) suppository 650 mg  650 mg Rectal Q6H PRN Oralia Manis, MD      . apixaban Everlene Balls) tablet 5 mg  5 mg Oral Q12H Auburn Bilberry, MD      . atorvastatin (LIPITOR) tablet 40 mg  40 mg Oral Daily Auburn Bilberry, MD      . busPIRone (BUSPAR) tablet 10 mg  10 mg Oral TID Auburn Bilberry, MD      . clonazePAM Scarlette Calico) tablet 1 mg  1 mg Oral TID Auburn Bilberry, MD   1 mg at  05/26/18 1439  . dicyclomine (BENTYL) capsule 10 mg  10 mg Oral TID PRN Auburn Bilberry, MD      . fenofibrate tablet 160 mg  160 mg Oral Daily Auburn Bilberry, MD      . FLUoxetine (PROZAC) capsule 40 mg  40 mg Oral Daily Auburn Bilberry, MD      . insulin aspart (novoLOG) injection 0-5 Units  0-5 Units Subcutaneous QHS Oralia Manis, MD      . insulin aspart (novoLOG) injection 0-9 Units  0-9 Units Subcutaneous TID WC Oralia Manis, MD   2 Units at 05/26/18 1155  . lipase/protease/amylase (CREON) capsule 24,000 Units  24,000 Units Oral TID WC Auburn Bilberry, MD      . loratadine (CLARITIN) tablet 10 mg  10 mg Oral Daily Oralia Manis, MD      . midodrine (PROAMATINE) tablet 10 mg  10 mg Oral TID WC Auburn Bilberry, MD   10 mg at 05/26/18 1155  . mirtazapine (REMERON) tablet 30 mg  30 mg Oral QHS Auburn Bilberry, MD      . pantoprazole (PROTONIX) EC tablet 40 mg  40 mg Oral BID Theresia Bough, MD   40 mg at 05/26/18 4132  . pregabalin (LYRICA) capsule 200 mg  200 mg Oral TID Auburn Bilberry, MD   200 mg at 05/26/18 1438  .  prochlorperazine (COMPAZINE) injection 5 mg  5 mg Intravenous Q4H PRN Oralia Manis, MD   5 mg at 05/26/18 1352  . senna (SENOKOT) tablet 8.6 mg  1 tablet Oral Daily Auburn Bilberry, MD   8.6 mg at 05/26/18 1439  . tiZANidine (ZANAFLEX) tablet 4 mg  4 mg Oral BID PRN Auburn Bilberry, MD      . traZODone (DESYREL) tablet 50 mg  50 mg Oral QHS PRN Oralia Manis, MD   50 mg at 05/26/18 0254  . Vitamin D (Ergocalciferol) (DRISDOL) capsule 50,000 Units  50,000 Units Oral Weekly Auburn Bilberry, MD        Past Medical History:  Diagnosis Date  . Allergy   . Anxiety   . Depression   . Diabetes (HCC)   . GERD (gastroesophageal reflux disease)   . GERD (gastroesophageal reflux disease)   . Hypertension   . IBS (irritable bowel syndrome)     Past Surgical History:  Procedure Laterality Date  . ABDOMINAL HYSTERECTOMY    . GALLBLADDER SURGERY      Social  History Social History   Tobacco Use  . Smoking status: Current Every Day Smoker    Packs/day: 0.50    Types: Cigarettes  . Smokeless tobacco: Never Used  Substance Use Topics  . Alcohol use: Not Currently  . Drug use: Not Currently    Family History Family History  Problem Relation Age of Onset  . Diabetes Father   . Heart disease Father   . Kidney disease Father   . COPD Father   . Cancer Paternal Aunt   . Diabetes Paternal Uncle   . Heart disease Paternal Uncle     Allergies  Allergen Reactions  . Precedex [Dexmedetomidine Hcl In Nacl] Other (See Comments)    Significant Bradycardia  . Cefpodoxime     Tolerates augmentin and Keflex   . Clarithromycin   . Doxycycline   . Ferrous Gluconate   . Lactose Intolerance (Gi)   . Levofloxacin   . Rizatriptan   . Sulfa Antibiotics   . Topiramate   . Venlafaxine   . Zofran [Ondansetron Hcl]      REVIEW OF SYSTEMS (patient is lethargic and does not respond to questions unable to obtain a review of systems)   Physical Examination  Vitals:   05/26/18 0217 05/26/18 0500 05/26/18 0635 05/26/18 1156  BP: 105/75 (!) 90/59  116/82  Pulse: 72 76 79 71  Resp: 19 (!) 21    Temp: 97.9 F (36.6 C) 98.1 F (36.7 C)  97.7 F (36.5 C)  TempSrc: Oral Oral  Oral  SpO2: 100% 99% 95% 99%  Weight:      Height:       Body mass index is 31.32 kg/m.  Head: [place]/AT, No temporalis wasting. Prominent temp pulse not noted. Ear/Nose/Throat: Nares w/o erythema or drainage, oropharynx very dry mucosa but unobstructed Neck: No bruit or JVD.  Pulmonary: No audible wheezing, no use of accessory muscles.  Cardiac: RRR,  Vascular: The right arm demonstrates 2+ brachial and radial pulses.  The left arm demonstrates 1+ brachial and radial pulses.  The pedal pulses are 2+ bilaterally in the dorsalis pedis and posterior tibial distributions. Gastrointestinal: soft, non-tender, non-distended.  Musculoskeletal: Moves all extremities.  No  deformity or atrophy. No edema. Neurologic: CN 2-12 intact. Symmetrical.  Speech is fluent.  Psychiatric: Judgment intact, Mood & affect appropriate for pt's clinical situation. Dermatologic: No rashes + ulcers right great toe noted.  No cellulitis or  open wounds. Lymph : No Cervical,  or Inguinal lymphadenopathy.      CBC Lab Results  Component Value Date   WBC 9.2 05/26/2018   HGB 7.7 (L) 05/26/2018   HCT 26.5 (L) 05/26/2018   MCV 77.7 (L) 05/26/2018   PLT 333 05/26/2018    BMET    Component Value Date/Time   NA 138 05/26/2018 0355   NA 140 03/10/2018 1827   K 3.3 (L) 05/26/2018 0355   CL 106 05/26/2018 0355   CO2 23 05/26/2018 0355   GLUCOSE 258 (H) 05/26/2018 0355   BUN 18 05/26/2018 0355   BUN 13 03/10/2018 1827   CREATININE 1.33 (H) 05/26/2018 0355   CALCIUM 8.4 (L) 05/26/2018 0355   GFRNONAA 48 (L) 05/26/2018 0355   GFRAA 56 (L) 05/26/2018 0355   Estimated Creatinine Clearance: 62.4 mL/min (A) (by C-G formula based on SCr of 1.33 mg/dL (H)).  COAG Lab Results  Component Value Date   INR 1.15 03/23/2018   INR 1.06 11/28/2017    Radiology I have personally reviewed the CT of the chest abdomen and pelvis.  There is a 70% stenosis at the origin of the left subclavian.  This appears to be soft plaque/thrombus.  There are no significant calcifications seen throughout her arterial system.  No other lesions are identified.  Assessment/Plan 1.  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  The patient's hand is pink and warm with good capillary refill and she does maintain a palpable pulse on the left side.  Although, by comparison it is not as strong as the right side.  Given her acute renal insufficiency I do not recommend left subclavian intervention at this time.  This can be readdressed when she follows up as an outpatient in the office.  However, I do believe it is important to measure her blood pressures from the  right arm only as the left arm will demonstrate a significant lower blood pressure given the stenosis in the subclavian artery.  The right arm will yield an accurate blood pressure.  2.  Hypotension: Continue IV fluid resuscitation  Monitor right arm for blood pressure readings.  3.  Lethargy uncertain etiology: Continue to monitor oxygen saturations.  I defer review of medications and toxicology work-up to the medical service.  4.  Diabetes (HCC)  sliding scale insulin coverage  5.  GERD (gastroesophageal reflux disease) home dose PPI     Levora DredgeGregory Schnier, MD  05/26/2018 4:53 PM

## 2018-05-26 NOTE — ED Notes (Signed)
Ambulated patient to toilet for patient to used restroom. Patient was able to stand and take few steps to toilet. Patient complained of dizziness.

## 2018-05-26 NOTE — ED Notes (Signed)
ED TO INPATIENT HANDOFF REPORT  ED Nurse Name and Phone #: Gevena Barre [phone]  S Name/Age/Gender Terri Berry 43 y.o. female Room/Bed: ED07A/ED07A  Code Status   Code Status: Prior  Home/SNF/Other Home Patient oriented to: self, place, time and situation Is this baseline? Yes   Triage Complete: Triage complete  Chief Complaint low bp  Triage Note Patient from home arriving by [facility] for hypotension. Per EMS patients intial BP 63/32 after NS BP 80/50. Patient complaining of dizziness, headache and chest discomfort.  Patient A&O x4.   Allergies Allergies  Allergen Reactions  . Precedex [Dexmedetomidine Hcl In Nacl] Other (See Comments)    Significant Bradycardia  . Cefpodoxime     Tolerates augmentin and Keflex   . Clarithromycin   . Doxycycline   . Ferrous Gluconate   . Lactose Intolerance (Gi)   . Levofloxacin   . Rizatriptan   . Sulfa Antibiotics   . Topiramate   . Venlafaxine   . Zofran [Ondansetron Hcl]     Level of Care/Admitting Diagnosis ED Disposition    ED Disposition Condition Comment   Admit  Hospital Area: [facility] [[id number]]  Level of Care: Med-Surg [16]  Covid Evaluation: N/A  Diagnosis: AKI (acute kidney injury) [facility]) [[id number]]  Admitting Physician: Oralia Manis [[id number]]  Attending Physician: Oralia Manis [[id number]]  PT Class (Do Not Modify): Observation [104]  PT Acc Code (Do Not Modify): Observation [10022]       B Medical/Surgery History Past Medical History:  Diagnosis Date  . Allergy   . Anxiety   . Depression   . Diabetes (HCC)   . GERD (gastroesophageal reflux disease)   . GERD (gastroesophageal reflux disease)   . Hypertension   . IBS (irritable bowel syndrome)    Past Surgical History:  Procedure Laterality Date  . ABDOMINAL HYSTERECTOMY    . GALLBLADDER SURGERY       A IV Location/Drains/Wounds Patient Lines/Drains/Airways Status   Active Line/Drains/Airways     Name:   Placement date:   Placement time:   Site:   Days:   Peripheral IV 03/26/18 Left;Lateral Wrist   03/26/18    0120    Wrist   61   Peripheral IV 05/25/18 Left Hand   05/25/18    2200    Hand   1   PICC Double Lumen 03/22/18 PICC Right Brachial 40 cm 1 cm   03/22/18    1852     65   NG/OG Tube Nasogastric 16 Fr. Left nare Xray 53 cm   03/23/18    1240    Left nare   64   Urethral Catheter Vergia Alberts RN Non-latex 16 Fr.   03/19/18    0916    Non-latex   68          Intake/Output Last 24 hours  Intake/Output Summary (Last 24 hours) at 05/26/2018 0137 Last data filed at 05/25/2018 2144 Gross per 24 hour  Intake 750 ml  Output -  Net 750 ml    Labs/Imaging Results for orders placed or performed during the hospital encounter of 05/25/18 (from the past 48 hour(s))  CBC with Differential/Platelet     Status: Abnormal   Collection Time: 05/25/18  9:52 PM  Result Value Ref Range   WBC 11.6 (H) 4.0 - 10.5 K/uL   RBC 3.56 (L) 3.87 - 5.11 MIL/uL   Hemoglobin 8.0 (L) 12.0 - 15.0 g/dL    Comment: Reticulocyte Hemoglobin testing may  be clinically indicated, consider ordering this additional test [id number]    HCT 27.3 (L) 36.0 - 46.0 %   MCV 76.7 (L) 80.0 - 100.0 fL   MCH 22.5 (L) 26.0 - 34.0 pg   MCHC 29.3 (L) 30.0 - 36.0 g/dL   RDW 54.0 98.1 - 19.1 %   Platelets 374 150 - 400 K/uL   nRBC 0.0 0.0 - 0.2 %   Neutrophils Relative % 59 %   Neutro Abs 6.8 1.7 - 7.7 K/uL   Lymphocytes Relative 30 %   Lymphs Abs 3.5 0.7 - 4.0 K/uL   Monocytes Relative 9 %   Monocytes Absolute 1.0 0.1 - 1.0 K/uL   Eosinophils Relative 1 %   Eosinophils Absolute 0.1 0.0 - 0.5 K/uL   Basophils Relative 1 %   Basophils Absolute 0.1 0.0 - 0.1 K/uL   Immature Granulocytes 0 %   Abs Immature Granulocytes 0.05 0.00 - 0.07 K/uL    Comment: Performed at [facility], [address] [place], [place] [address]  Comprehensive metabolic panel     Status: Abnormal   Collection Time: 05/25/18  9:52  PM  Result Value Ref Range   Sodium 137 135 - 145 mmol/L   Potassium 3.2 (L) 3.5 - 5.1 mmol/L   Chloride 101 98 - 111 mmol/L   CO2 23 22 - 32 mmol/L   Glucose, Bld 141 (H) 70 - 99 mg/dL   BUN 19 6 - 20 mg/dL   Creatinine, Ser 5.62 (H) 0.44 - 1.00 mg/dL   Calcium 8.5 (L) 8.9 - 10.3 mg/dL   Total Protein 7.4 6.5 - 8.1 g/dL   Albumin 3.8 3.5 - 5.0 g/dL   AST 15 15 - 41 U/L   ALT 11 0 - 44 U/L   Alkaline Phosphatase 54 38 - 126 U/L   Total Bilirubin <0.1 (L) 0.3 - 1.2 mg/dL   GFR calc non Af Amer 41 (L) >60 mL/min   GFR calc Af Amer 48 (L) >60 mL/min   Anion gap 13 5 - 15    Comment: Performed at [facility], [address] [place], [place] [address]  Lipase, blood     Status: None   Collection Time: 05/25/18  9:52 PM  Result Value Ref Range   Lipase 49 11 - 51 U/L    Comment: Performed at [facility] [address] [place], [place] [address]  Troponin I - ONCE - STAT     Status: None   Collection Time: 05/25/18  9:52 PM  Result Value Ref Range   Troponin I <0.03 <0.03 ng/mL    Comment: Performed at [facility], [address] [place], [place] [address]  Urinalysis, Complete w Microscopic     Status: Abnormal   Collection Time: 05/25/18  9:52 PM  Result Value Ref Range   Color, Urine STRAW (A) YELLOW   APPearance CLEAR (A) CLEAR   Specific Gravity, Urine 1.006 1.005 - 1.030   pH 6.0 5.0 - 8.0   Glucose, UA NEGATIVE NEGATIVE mg/dL   Hgb urine dipstick NEGATIVE NEGATIVE   Bilirubin Urine NEGATIVE NEGATIVE   Ketones, ur NEGATIVE NEGATIVE mg/dL   Protein, ur NEGATIVE NEGATIVE mg/dL   Nitrite NEGATIVE NEGATIVE   Leukocytes,Ua NEGATIVE NEGATIVE   WBC, UA 0-5 0 - 5 WBC/hpf   Bacteria, UA NONE SEEN NONE SEEN   Squamous Epithelial / LPF 0-5 0 - 5    Comment: Performed at [facility], [address] [place], [place] [address]  Lactic acid, plasma     Status: None   Collection Time: 05/25/18  9:52 PM  Result Value Ref Range   Lactic  Acid, Venous 1.7 0.5 - 1.9 mmol/L    Comment: Performed at [facility], [address], [place], [place] [address]   Dg Abdomen Acute W/chest  Result Date: 05/25/2018 CLINICAL DATA:  Hypertension.  Chest and abdominal pain. EXAM: DG ABDOMEN ACUTE W/ 1V CHEST COMPARISON:  Abdominal and chest x-rays dated March 26, 2018. FINDINGS: The cardiomediastinal silhouette is normal in size. Normal pulmonary vascularity. Low lung volumes with mild bibasilar atelectasis. No focal consolidation, pleural effusion, or pneumothorax. There are few mildly dilated loops of small bowel in the central and left abdomen. No air-fluid levels. No pneumoperitoneum. No acute osseous abnormality. IMPRESSION: 1. There are few mildly dilated loops of small bowel in the central and left abdomen which could reflect early or partial obstruction versus ileus. 2. Low lung volumes with mild bibasilar atelectasis. No active cardiopulmonary disease. Electronically Signed   By: Obie Dredge M.D.   On: 05/25/2018 22:31   Ct Angio Chest/abd/pel For Dissection W And/or Wo Contrast  Result Date: 05/26/2018 CLINICAL DATA:  Hypotension. EXAM: CT ANGIOGRAPHY CHEST, ABDOMEN AND PELVIS TECHNIQUE: Multidetector CT imaging through the chest, abdomen and pelvis was performed using the standard protocol during bolus administration of intravenous contrast. Multiplanar reconstructed images and MIPs were obtained and reviewed to evaluate the vascular anatomy. CONTRAST:  75mL OMNIPAQUE IOHEXOL 350 MG/ML SOLN COMPARISON:  Chest CT 03/19/2018 CT abdomen and pelvis 03/18/2018. FINDINGS: CTA CHEST FINDINGS Cardiovascular: Heart is normal size. Aorta is normal caliber. No evidence of aortic dissection. Moderate soft plaque within the proximal left subclavian artery causing greater than 50% luminal narrowing. Mediastinum/Nodes: No mediastinal, hilar, or axillary adenopathy. Lungs/Pleura: Dependent atelectasis in the lower lobes. No confluent opacities  or effusions. Musculoskeletal: Chest wall soft tissues are unremarkable. No acute bony abnormality. Review of the MIP images confirms the above findings. CTA ABDOMEN AND PELVIS FINDINGS VASCULAR Aorta: Normal caliber aorta without aneurysm, dissection, vasculitis or significant stenosis. Celiac: Patent without evidence of aneurysm, dissection, vasculitis or significant stenosis. SMA: Patent without evidence of aneurysm, dissection, vasculitis or significant stenosis. Renals: Both renal arteries are patent without evidence of aneurysm, dissection, vasculitis, fibromuscular dysplasia or significant stenosis. IMA: Patent without evidence of aneurysm, dissection, vasculitis or significant stenosis. Inflow: Patent without evidence of aneurysm, dissection, vasculitis or significant stenosis. Veins: No obvious venous abnormality within the limitations of this arterial phase study. Review of the MIP images confirms the above findings. NON-VASCULAR Hepatobiliary: No focal hepatic abnormality.  Prior cholecystectomy Pancreas: No focal abnormality or ductal dilatation. Spleen: No focal abnormality.  Normal size. Adrenals/Urinary Tract: No adrenal abnormality. No focal renal abnormality. No stones or hydronephrosis. Urinary bladder is unremarkable. Stomach/Bowel: Normal appendix. Stomach, large and small bowel grossly unremarkable. Lymphatic: Shotty retroperitoneal lymph nodes, none pathologically enlarged. Reproductive: Prior hysterectomy.  No adnexal masses. Other: No free fluid or free air. Musculoskeletal: No acute bony abnormality. Degenerative disc disease in the lower lumbar spine. Review of the MIP images confirms the above findings. IMPRESSION: No evidence of aortic aneurysm or dissection. Moderate soft plaque in the proximal left subclavian artery causing greater than 50% luminal stenosis. No acute cardiopulmonary disease. Dependent atelectasis in the lower lobes. No acute findings in the abdomen or pelvis.  Electronically Signed   By: Charlett Nose M.D.   On: 05/26/2018 00:35    Pending Labs Unresulted  Labs (From admission, onward)    Start     Ordered   Signed and Held  CBC  (heparin)  Once,   R    Comments:  Baseline for heparin therapy IF NOT ALREADY DRAWN.  Notify MD if PLT < 100 K.    Signed and Held   Signed and Held  Creatinine, serum  (heparin)  Once,   R    Comments:  Baseline for heparin therapy IF NOT ALREADY DRAWN.    Signed and Held   Signed and Held  Basic metabolic panel  Tomorrow morning,   R     Signed and Held   Signed and Held  CBC  Tomorrow morning,   R     Signed and Held          Vitals/Pain Today's Vitals   05/25/18 2153 05/25/18 2154 05/25/18 2156 05/25/18 2200  BP: (!) 74/51   (!) 72/56  Pulse: 69   70  Resp: 16   11  Temp: 98.1 F (36.7 C)     TempSrc: Oral     SpO2: 98%   98%  Weight:   90.7 kg   Height:   5\' 7"  (1.702 m)   PainSc:  10-Worst pain ever      Isolation Precautions No active isolations  Medications Medications  0.9 %  sodium chloride infusion (has no administration in time range)  prochlorperazine (COMPAZINE) injection 5 mg (has no administration in time range)  traZODone (DESYREL) tablet 50 mg (has no administration in time range)  acetaminophen (TYLENOL) tablet 650 mg (has no administration in time range)    Or  acetaminophen (TYLENOL) suppository 650 mg (has no administration in time range)  sodium chloride 0.9 % bolus 1,000 mL (1,000 mLs Intravenous New Bag/Given 05/25/18 2236)  iohexol (OMNIPAQUE) 350 MG/ML injection 75 mL (75 mLs Intravenous Contrast Given 05/25/18 2305)    Mobility walks High fall risk   Focused Assessments    R Recommendations: See Admitting Provider Note  Report given to:   Additional Notes:

## 2018-05-27 DIAGNOSIS — Z794 Long term (current) use of insulin: Secondary | ICD-10-CM | POA: Diagnosis not present

## 2018-05-27 DIAGNOSIS — Z79890 Hormone replacement therapy (postmenopausal): Secondary | ICD-10-CM | POA: Diagnosis not present

## 2018-05-27 DIAGNOSIS — I959 Hypotension, unspecified: Secondary | ICD-10-CM | POA: Diagnosis present

## 2018-05-27 DIAGNOSIS — K219 Gastro-esophageal reflux disease without esophagitis: Secondary | ICD-10-CM | POA: Diagnosis present

## 2018-05-27 DIAGNOSIS — Z79899 Other long term (current) drug therapy: Secondary | ICD-10-CM | POA: Diagnosis not present

## 2018-05-27 DIAGNOSIS — I708 Atherosclerosis of other arteries: Secondary | ICD-10-CM | POA: Diagnosis present

## 2018-05-27 DIAGNOSIS — Z841 Family history of disorders of kidney and ureter: Secondary | ICD-10-CM | POA: Diagnosis not present

## 2018-05-27 DIAGNOSIS — I10 Essential (primary) hypertension: Secondary | ICD-10-CM | POA: Diagnosis present

## 2018-05-27 DIAGNOSIS — K589 Irritable bowel syndrome without diarrhea: Secondary | ICD-10-CM | POA: Diagnosis present

## 2018-05-27 DIAGNOSIS — R42 Dizziness and giddiness: Secondary | ICD-10-CM | POA: Diagnosis not present

## 2018-05-27 DIAGNOSIS — Z7901 Long term (current) use of anticoagulants: Secondary | ICD-10-CM | POA: Diagnosis not present

## 2018-05-27 DIAGNOSIS — L089 Local infection of the skin and subcutaneous tissue, unspecified: Secondary | ICD-10-CM | POA: Diagnosis present

## 2018-05-27 DIAGNOSIS — Z881 Allergy status to other antibiotic agents status: Secondary | ICD-10-CM | POA: Diagnosis not present

## 2018-05-27 DIAGNOSIS — F419 Anxiety disorder, unspecified: Secondary | ICD-10-CM | POA: Diagnosis present

## 2018-05-27 DIAGNOSIS — F17210 Nicotine dependence, cigarettes, uncomplicated: Secondary | ICD-10-CM | POA: Diagnosis present

## 2018-05-27 DIAGNOSIS — I771 Stricture of artery: Secondary | ICD-10-CM | POA: Diagnosis present

## 2018-05-27 DIAGNOSIS — F329 Major depressive disorder, single episode, unspecified: Secondary | ICD-10-CM | POA: Diagnosis present

## 2018-05-27 DIAGNOSIS — E860 Dehydration: Secondary | ICD-10-CM | POA: Diagnosis present

## 2018-05-27 DIAGNOSIS — Z833 Family history of diabetes mellitus: Secondary | ICD-10-CM | POA: Diagnosis not present

## 2018-05-27 DIAGNOSIS — Z882 Allergy status to sulfonamides status: Secondary | ICD-10-CM | POA: Diagnosis not present

## 2018-05-27 DIAGNOSIS — Z8249 Family history of ischemic heart disease and other diseases of the circulatory system: Secondary | ICD-10-CM | POA: Diagnosis not present

## 2018-05-27 DIAGNOSIS — N179 Acute kidney failure, unspecified: Secondary | ICD-10-CM | POA: Diagnosis present

## 2018-05-27 DIAGNOSIS — D509 Iron deficiency anemia, unspecified: Secondary | ICD-10-CM | POA: Diagnosis present

## 2018-05-27 DIAGNOSIS — K861 Other chronic pancreatitis: Secondary | ICD-10-CM | POA: Diagnosis present

## 2018-05-27 DIAGNOSIS — E119 Type 2 diabetes mellitus without complications: Secondary | ICD-10-CM | POA: Diagnosis present

## 2018-05-27 LAB — CBC WITH DIFFERENTIAL/PLATELET
Abs Immature Granulocytes: 0.02 10*3/uL (ref 0.00–0.07)
Basophils Absolute: 0.1 10*3/uL (ref 0.0–0.1)
Basophils Relative: 1 %
Eosinophils Absolute: 0.3 10*3/uL (ref 0.0–0.5)
Eosinophils Relative: 4 %
HCT: 27.8 % — ABNORMAL LOW (ref 36.0–46.0)
Hemoglobin: 8.1 g/dL — ABNORMAL LOW (ref 12.0–15.0)
Immature Granulocytes: 0 %
Lymphocytes Relative: 24 %
Lymphs Abs: 1.6 10*3/uL (ref 0.7–4.0)
MCH: 22.1 pg — ABNORMAL LOW (ref 26.0–34.0)
MCHC: 29.1 g/dL — ABNORMAL LOW (ref 30.0–36.0)
MCV: 76 fL — ABNORMAL LOW (ref 80.0–100.0)
Monocytes Absolute: 0.4 10*3/uL (ref 0.1–1.0)
Monocytes Relative: 6 %
Neutro Abs: 4.2 10*3/uL (ref 1.7–7.7)
Neutrophils Relative %: 65 %
Platelets: 332 10*3/uL (ref 150–400)
RBC: 3.66 MIL/uL — ABNORMAL LOW (ref 3.87–5.11)
RDW: 15.1 % (ref 11.5–15.5)
WBC: 6.5 10*3/uL (ref 4.0–10.5)
nRBC: 0 % (ref 0.0–0.2)

## 2018-05-27 LAB — BASIC METABOLIC PANEL
Anion gap: 10 (ref 5–15)
BUN: 11 mg/dL (ref 6–20)
CO2: 25 mmol/L (ref 22–32)
Calcium: 8.9 mg/dL (ref 8.9–10.3)
Chloride: 105 mmol/L (ref 98–111)
Creatinine, Ser: 1.05 mg/dL — ABNORMAL HIGH (ref 0.44–1.00)
GFR calc Af Amer: >60 mL/min (ref >60)
GFR calc non Af Amer: >60 mL/min (ref >60)
Glucose, Bld: 204 mg/dL — ABNORMAL HIGH (ref 70–99)
Potassium: 3.7 mmol/L (ref 3.5–5.1)
Sodium: 140 mmol/L (ref 135–145)

## 2018-05-27 LAB — GLUCOSE, CAPILLARY
Glucose-Capillary: 149 mg/dL — ABNORMAL HIGH (ref 70–99)
Glucose-Capillary: 230 mg/dL — ABNORMAL HIGH (ref 70–99)
Glucose-Capillary: 143 mg/dL — ABNORMAL HIGH (ref 70–99)
Glucose-Capillary: 172 mg/dL — ABNORMAL HIGH (ref 70–99)

## 2018-05-27 LAB — ACTH STIMULATION, 3 TIME POINTS
Cortisol, 30 Min: 26.1 ug/dL
Cortisol, 60 Min: 28.8 ug/dL
Cortisol, Base: 15.4 ug/dL

## 2018-05-27 MED ORDER — INSULIN GLARGINE 100 UNIT/ML ~~LOC~~ SOLN
10.0000 [IU] | Freq: Every day | SUBCUTANEOUS | Status: DC
  Administered 2018-05-27 – 2018-05-28 (×2): 10 [IU] via SUBCUTANEOUS
  Filled 2018-05-27 (×3): qty 0.1

## 2018-05-27 MED ORDER — COSYNTROPIN 0.25 MG IJ SOLR
0.2500 mg | Freq: Once | INTRAMUSCULAR | Status: AC
  Administered 2018-05-27: 0.25 mg via INTRAVENOUS
  Filled 2018-05-27: qty 0.25

## 2018-05-27 MED ORDER — SENNA 8.6 MG PO TABS
4.0000 | ORAL_TABLET | Freq: Every day | ORAL | Status: DC
  Administered 2018-05-28: 09:00:00 34.4 mg via ORAL
  Filled 2018-05-27: qty 4

## 2018-05-27 NOTE — Progress Notes (Signed)
[employer] - [place] at [facility]                                                                                                                                                                                  Patient Demographics   Terri Berry, is a 43 y.o. female, DOB - 10-Nov-1975, ZOX:[id number]  Admit date - 05/25/2018   Admitting Physician Oralia Manis, MD  Outpatient Primary MD for the patient is Thomes Dinning, MD   LOS - 0  Subjective: Patient's blood pressure is improved she is feeling little stronger    Review of Systems:   CONSTITUTIONAL: No documented fever.  Positive fatigue, positive weakness. No weight gain, no weight loss.  EYES: No blurry or double vision.  ENT: No tinnitus. No postnasal drip. No redness of the oropharynx.  RESPIRATORY: No cough, no wheeze, no hemoptysis. No dyspnea.  CARDIOVASCULAR: No chest pain. No orthopnea. No palpitations. No syncope.  GASTROINTESTINAL: No nausea, no vomiting or diarrhea. No abdominal pain. No melena or hematochezia.  GENITOURINARY: No dysuria or hematuria.  ENDOCRINE: No polyuria or nocturia. No heat or cold intolerance.  HEMATOLOGY: No anemia. No bruising. No bleeding.  INTEGUMENTARY: Chronic right toe infection MUSCULOSKELETAL: No arthritis. No swelling. No gout.  NEUROLOGIC: No numbness, tingling, or ataxia. No seizure-type activity.  PSYCHIATRIC: No anxiety. No insomnia. No ADD.    Vitals:   Vitals:   05/26/18 1156 05/26/18 2005 05/27/18 0000 05/27/18 0402  BP: 116/82 118/75 130/82 115/86  Pulse: 71 77 71 84  Resp:  Temp: 97.7 F (36.5 C) 97.8 F (36.6 C) 98.2 F (36.8 C) 98.1 F (36.7 C)  TempSrc: Oral Oral  Oral  SpO2: 99% 98% 99% 98%  Weight:      Height:        Wt Readings from Last 3 Encounters:  05/25/18 90.7 kg  05/01/18 90.7 kg  03/27/18 99.3 kg     Intake/Output Summary (Last 24 hours) at 05/27/2018 1052 Last data filed at 05/27/2018 0900 Gross per 24 hour   Intake 1513.7 ml  Output 500 ml  Net 1013.7 ml    Physical Exam:   GENERAL: Pleasant-appearing in no apparent distress.  HEAD, EYES, EARS, NOSE AND THROAT: Atraumatic, normocephalic. Extraocular muscles are intact. Pupils equal and reactive to light. Sclerae anicteric. No conjunctival injection. No oro-pharyngeal erythema.  NECK: Supple. There is no jugular venous distention. No bruits, no lymphadenopathy, no thyromegaly.  HEART: Regular rate and rhythm,. No murmurs, no rubs, no clicks.  LUNGS: Clear to auscultation bilaterally. No rales or rhonchi. No wheezes.  ABDOMEN: Soft, flat, nontender, nondistended. Has good bowel sounds. No hepatosplenomegaly appreciated.  EXTREMITIES:  Dressing in place NEUROLOGIC: The patient is alert, awake, and oriented x3 with no focal motor or sensory deficits appreciated bilaterally.  SKIN: Moist and warm with no rashes appreciated.  Psych: Not anxious, depressed LN: No inguinal LN enlargement    Antibiotics   Anti-infectives (From admission, onward)   None      Medications   Scheduled Meds: . apixaban  5 mg Oral Q12H  . atorvastatin  40 mg Oral Daily  . busPIRone  10 mg Oral TID  . clonazePAM  1 mg Oral TID  . fenofibrate  160 mg Oral Daily  . FLUoxetine  40 mg Oral Daily  . insulin aspart  0-5 Units Subcutaneous QHS  . insulin aspart  0-9 Units Subcutaneous TID WC  . lipase/protease/amylase  24,000 Units Oral TID WC  . loratadine  10 mg Oral Daily  . midodrine  10 mg Oral TID WC  . mirtazapine  30 mg Oral QHS  . pantoprazole  40 mg Oral BID AC  . pregabalin  200 mg Oral TID  . senna  1 tablet Oral Daily  . Vitamin D (Ergocalciferol)  50,000 Units Oral Q Wed   Continuous Infusions: . sodium chloride 125 mL/hr at 05/27/18 0905   PRN Meds:.acetaminophen **OR** acetaminophen, dicyclomine, prochlorperazine, tiZANidine, traZODone   Data Review:   Micro Results No results found for this or any previous visit (from the past 240  hour(s)).  Radiology Reports Dg Abdomen Acute W/chest  Result Date: 05/25/2018 CLINICAL DATA:  Hypertension.  Chest and abdominal pain. EXAM: DG ABDOMEN ACUTE W/ 1V CHEST COMPARISON:  Abdominal and chest x-rays dated March 26, 2018. FINDINGS: The cardiomediastinal silhouette is normal in size. Normal pulmonary vascularity. Low lung volumes with mild bibasilar atelectasis. No focal consolidation, pleural effusion, or pneumothorax. There are few mildly dilated loops of small bowel in the central and left abdomen. No air-fluid levels. No pneumoperitoneum. No acute osseous abnormality. IMPRESSION: 1. There are few mildly dilated loops of small bowel in the central and left abdomen which could reflect early or partial obstruction versus ileus. 2. Low lung volumes with mild bibasilar atelectasis. No active cardiopulmonary disease. Electronically Signed   By: Obie Dredge M.D.   On: 05/25/2018 22:31   Ct Angio Chest/abd/pel For Dissection W And/or Wo Contrast  Result Date: 05/26/2018 CLINICAL DATA:  Hypotension. EXAM: CT ANGIOGRAPHY CHEST, ABDOMEN AND PELVIS TECHNIQUE: Multidetector CT imaging through the chest, abdomen and pelvis was performed using the standard protocol during bolus administration of intravenous contrast. Multiplanar reconstructed images and MIPs were obtained and reviewed to evaluate the vascular anatomy. CONTRAST:  75mL OMNIPAQUE IOHEXOL 350 MG/ML SOLN COMPARISON:  Chest CT 03/19/2018 CT abdomen and pelvis 03/18/2018. FINDINGS: CTA CHEST FINDINGS Cardiovascular: Heart is normal size. Aorta is normal caliber. No evidence of aortic dissection. Moderate soft plaque within the proximal left subclavian artery causing greater than 50% luminal narrowing. Mediastinum/Nodes: No mediastinal, hilar, or axillary adenopathy. Lungs/Pleura: Dependent atelectasis in the lower lobes. No confluent opacities or effusions. Musculoskeletal: Chest wall soft tissues are unremarkable. No acute bony abnormality.  Review of the MIP images confirms the above findings. CTA ABDOMEN AND PELVIS FINDINGS VASCULAR Aorta: Normal caliber aorta without aneurysm, dissection, vasculitis or significant stenosis. Celiac: Patent without evidence of aneurysm, dissection, vasculitis or significant stenosis. SMA: Patent without evidence of aneurysm, dissection, vasculitis or significant stenosis. Renals: Both renal arteries are patent without evidence of aneurysm, dissection, vasculitis, fibromuscular dysplasia or significant stenosis. IMA: Patent without evidence of aneurysm, dissection,  vasculitis or significant stenosis. Inflow: Patent without evidence of aneurysm, dissection, vasculitis or significant stenosis. Veins: No obvious venous abnormality within the limitations of this arterial phase study. Review of the MIP images confirms the above findings. NON-VASCULAR Hepatobiliary: No focal hepatic abnormality.  Prior cholecystectomy Pancreas: No focal abnormality or ductal dilatation. Spleen: No focal abnormality.  Normal size. Adrenals/Urinary Tract: No adrenal abnormality. No focal renal abnormality. No stones or hydronephrosis. Urinary bladder is unremarkable. Stomach/Bowel: Normal appendix. Stomach, large and small bowel grossly unremarkable. Lymphatic: Shotty retroperitoneal lymph nodes, none pathologically enlarged. Reproductive: Prior hysterectomy.  No adnexal masses. Other: No free fluid or free air. Musculoskeletal: No acute bony abnormality. Degenerative disc disease in the lower lumbar spine. Review of the MIP images confirms the above findings. IMPRESSION: No evidence of aortic aneurysm or dissection. Moderate soft plaque in the proximal left subclavian artery causing greater than 50% luminal stenosis. No acute cardiopulmonary disease. Dependent atelectasis in the lower lobes. No acute findings in the abdomen or pelvis. Electronically Signed   By: Charlett NoseKevin  Dover M.D.   On: 05/26/2018 00:35     CBC Recent Labs  Lab  05/25/18 2152 05/26/18 0355 05/27/18 0944  WBC 11.6* 9.2 6.5  HGB 8.0* 7.7* 8.1*  HCT 27.3* 26.5* 27.8*  PLT 374 333 332  MCV 76.7* 77.7* 76.0*  MCH 22.5* 22.6* 22.1*  MCHC 29.3* 29.1* 29.1*  RDW 15.2 15.3 15.1  LYMPHSABS 3.5  --  1.6  MONOABS 1.0  --  0.4  EOSABS 0.1  --  0.3  BASOSABS 0.1  --  0.1    Chemistries  Recent Labs  Lab 05/25/18 2152 05/26/18 0355 05/27/18 0944  NA 137 138 140  K 3.2* 3.3* 3.7  CL 101 106 105  CO2 23 23 25   GLUCOSE 141* 258* 204*  BUN 19 18 11   CREATININE 1.52* 1.33* 1.05*  CALCIUM 8.5* 8.4* 8.9  AST 15  --   --   ALT 11  --   --   ALKPHOS 54  --   --   BILITOT <0.1*  --   --    ------------------------------------------------------------------------------------------------------------------ estimated creatinine clearance is 79 mL/min (A) (by C-G formula based on SCr of 1.05 mg/dL (H)). ------------------------------------------------------------------------------------------------------------------ No results for input(s): HGBA1C in the last 72 hours. ------------------------------------------------------------------------------------------------------------------ No results for input(s): CHOL, HDL, LDLCALC, TRIG, CHOLHDL, LDLDIRECT in the last 72 hours. ------------------------------------------------------------------------------------------------------------------ No results for input(s): TSH, T4TOTAL, T3FREE, THYROIDAB in the last 72 hours.  Invalid input(s): FREET3 ------------------------------------------------------------------------------------------------------------------ Recent Labs    05/26/18 0355  VITAMINB12 146*  FOLATE 11.7  FERRITIN 3*  TIBC 600*  IRON 11*  RETICCTPCT 1.5    Coagulation profile No results for input(s): INR, PROTIME in the last 168 hours.  No results for input(s): DDIMER in the last 72 hours.  Cardiac Enzymes Recent Labs  Lab 05/25/18 2152  TROPONINI <0.03    ------------------------------------------------------------------------------------------------------------------ Invalid input(s): POCBNP    Assessment & Plan  Patient is 43 year old presenting with hypotension and acute kidney injury    AKI (acute kidney injury) (HCC) resolved with IV fluid  Hypotension patient's cortisol level is 2 which is severely low adrenal insufficiency needs to be ruled Out we will do ACTH stimulation Continue hydrating  Right great toe possible infection we will treat her with oral antibiotics for now, being well followed by wound care nurse  Anemia work-up shows iron deficiency  Continue to monitor  Subclavian arterial stenosis (HCC) -patient denies any symptoms, Vascular surgery has been consulted Continue Eliquis  as taking at home   Diabetes (HCC) -sliding scale insulin coverage, will resume patient's home medication     GERD (gastroesophageal reflux disease) -home dose PPI    Anxiety -continue trazodone     Code Status Orders  (From admission, onward)         Start     Ordered   05/26/18 0212  Full code  Continuous     05/26/18 0211        Code Status History    Date Active Date Inactive Code Status Order ID Comments User Context   03/18/2018 1409 03/27/2018 2130 Full Code [id number]  Milagros Loll, MD ED   02/09/2018 0921 02/15/2018 1549 Full Code [id number]  Barbaraann Rondo, MD Inpatient   11/28/2017 0352 12/04/2017 2009 Full Code [id number]  Barbaraann Rondo, MD Inpatient           Consults  Vascular surgery   DVT Prophylaxis Heparin  Lab Results  Component Value Date   PLT 332 05/27/2018     Time Spent in minutes 35 minutes greater than 50% of time spent in care coordination and counseling patient regarding the condition and plan of care. [id number] am   Auburn Bilberry M.D on 05/27/2018 at 10:52 AM  Between 7am to 6pm - Pager - [phone]  After 6pm go to [email or web address] - Social research officer, government  [employer]   Office  [phone]

## 2018-05-28 LAB — GLUCOSE, CAPILLARY
Glucose-Capillary: 130 mg/dL — ABNORMAL HIGH (ref 70–99)
Glucose-Capillary: 172 mg/dL — ABNORMAL HIGH (ref 70–99)

## 2018-05-28 MED ORDER — MIDODRINE HCL 10 MG PO TABS: 10.0000 mg | ORAL_TABLET | Freq: Three times a day (TID) | ORAL | 0 refills | Status: DC

## 2018-05-28 MED ORDER — PROMETHAZINE HCL 25 MG PO TABS
12.5000 mg | ORAL_TABLET | Freq: Four times a day (QID) | ORAL | Status: DC | PRN
  Administered 2018-05-28: 12.5 mg via ORAL
  Filled 2018-05-28: qty 1

## 2018-05-28 MED ORDER — AMOXICILLIN-POT CLAVULANATE 875-125 MG PO TABS: 1.0000 | ORAL_TABLET | Freq: Two times a day (BID) | ORAL | 0 refills | Status: AC

## 2018-05-28 NOTE — TOC Transition Note (Signed)
Transition of Care [facility]) - CM/SW Discharge Note   Patient Details  Name: Ayliah Flick MRN: [id number] Date of Birth: [birth year]  [facility] CM/SW Contact:  Chapman Fitch, RN Phone Number: 05/28/2018, 4:11 PM   Clinical Narrative:     Patient to discharge today.  Patient discharging on midodrine, and Augmentin.  Medications were obtained at no cost from Medication Management  And delivered to the bedside RN.   Final next level of care: Home/Self Care Barriers to Discharge: Barriers Resolved   Patient Goals and CMS Choice Patient states their goals for this hospitalization and ongoing recovery are:: (Patient does not answer. Drowsy and drops off to sleep)      Discharge Placement                       Discharge Plan and Services   Discharge Planning Services: CM Consult                                 Social Determinants of Health (SDOH) Interventions     Readmission Risk Interventions Readmission Risk Prevention Plan 05/28/2018  Transportation Screening Complete  PCP or Specialist Appt within 3-5 Days Complete  HRI or Home Care Consult Not Complete  HRI or Home Care Consult comments NA  Social Work Consult for Recovery Care Planning/Counseling Not Complete  SW consult not completed comments NA  Palliative Care Screening Not Applicable  Medication Review Oceanographer) Complete  Some recent data might be hidden

## 2018-05-28 NOTE — Discharge Summary (Signed)
[facility] - [place] at [facility]  Terri Berry, 43 y.o., DOB 03-02-1975, MRN [id number]. Admission date: 05/25/2018 Discharge Date 05/28/2018 Primary MD Thomes Dinning, MD Admitting Physician Oralia Manis, MD  Admission Diagnosis  Acute kidney injury [facility]) [N17.9] Hypotension, unspecified hypotension type [I95.9] Anemia, unspecified type [D64.9]  Discharge Diagnosis   Principal Problem: Acute kidney injury due to dehydration now resolved Hypotension likely due to dehydration now resolved Right great toe infection we will treat with Augmentin Anemia Subclavian arterial stenosis Diabetes GERD Anxiety   Hospital Course  Terri Berry  is a 43 y.o. female who presents with chief complaint as above.  Patient presents to the ED via EMS after an episode of hypotension at home.  Patient states that she became lightheaded, and then fell.  She denies full loss of consciousness.  When EMS arrived to her house her blood pressure was in the 60s systolic.  She was given 750 cc of normal saline in route to the hospital, and then another liter here in the ED.  Her blood pressure improved to 90s/low 100s systolic.  Work-up here in the ED is largely unrevealing.  Patient has some AKI.  Patient is on antihypertensives which was stopped.  She was given IV fluids.  With improvement in her blood pressure.  She also had a cortisol check which was 2.  I did a ACTH stimulation that was completely normal.  At this point I will continue to hold her blood pressure medications we will treat her with midodrine for short-term she will need to follow-up with primary care provider.            Consults  None  Significant Tests:  See full reports for all details    Dg Abdomen Acute W/chest  Result Date: 05/25/2018 CLINICAL DATA:  Hypertension.  Chest and abdominal pain. EXAM: DG ABDOMEN ACUTE W/ 1V CHEST COMPARISON:  Abdominal and chest x-rays dated March 26, 2018. FINDINGS: The  cardiomediastinal silhouette is normal in size. Normal pulmonary vascularity. Low lung volumes with mild bibasilar atelectasis. No focal consolidation, pleural effusion, or pneumothorax. There are few mildly dilated loops of small bowel in the central and left abdomen. No air-fluid levels. No pneumoperitoneum. No acute osseous abnormality. IMPRESSION: 1. There are few mildly dilated loops of small bowel in the central and left abdomen which could reflect early or partial obstruction versus ileus. 2. Low lung volumes with mild bibasilar atelectasis. No active cardiopulmonary disease. Electronically Signed   By: Obie Dredge M.D.   On: 05/25/2018 22:31   Ct Angio Chest/abd/pel For Dissection W And/or Wo Contrast  Result Date: 05/26/2018 CLINICAL DATA:  Hypotension. EXAM: CT ANGIOGRAPHY CHEST, ABDOMEN AND PELVIS TECHNIQUE: Multidetector CT imaging through the chest, abdomen and pelvis was performed using the standard protocol during bolus administration of intravenous contrast. Multiplanar reconstructed images and MIPs were obtained and reviewed to evaluate the vascular anatomy. CONTRAST:  75mL OMNIPAQUE IOHEXOL 350 MG/ML SOLN COMPARISON:  Chest CT 03/19/2018 CT abdomen and pelvis 03/18/2018. FINDINGS: CTA CHEST FINDINGS Cardiovascular: Heart is normal size. Aorta is normal caliber. No evidence of aortic dissection. Moderate soft plaque within the proximal left subclavian artery causing greater than 50% luminal narrowing. Mediastinum/Nodes: No mediastinal, hilar, or axillary adenopathy. Lungs/Pleura: Dependent atelectasis in the lower lobes. No confluent opacities or effusions. Musculoskeletal: Chest wall soft tissues are unremarkable. No acute bony abnormality. Review of the MIP images confirms the above findings. CTA ABDOMEN AND PELVIS FINDINGS VASCULAR Aorta: Normal caliber aorta without aneurysm, dissection,  vasculitis or significant stenosis. Celiac: Patent without evidence of aneurysm, dissection,  vasculitis or significant stenosis. SMA: Patent without evidence of aneurysm, dissection, vasculitis or significant stenosis. Renals: Both renal arteries are patent without evidence of aneurysm, dissection, vasculitis, fibromuscular dysplasia or significant stenosis. IMA: Patent without evidence of aneurysm, dissection, vasculitis or significant stenosis. Inflow: Patent without evidence of aneurysm, dissection, vasculitis or significant stenosis. Veins: No obvious venous abnormality within the limitations of this arterial phase study. Review of the MIP images confirms the above findings. NON-VASCULAR Hepatobiliary: No focal hepatic abnormality.  Prior cholecystectomy Pancreas: No focal abnormality or ductal dilatation. Spleen: No focal abnormality.  Normal size. Adrenals/Urinary Tract: No adrenal abnormality. No focal renal abnormality. No stones or hydronephrosis. Urinary bladder is unremarkable. Stomach/Bowel: Normal appendix. Stomach, large and small bowel grossly unremarkable. Lymphatic: Shotty retroperitoneal lymph nodes, none pathologically enlarged. Reproductive: Prior hysterectomy.  No adnexal masses. Other: No free fluid or free air. Musculoskeletal: No acute bony abnormality. Degenerative disc disease in the lower lumbar spine. Review of the MIP images confirms the above findings. IMPRESSION: No evidence of aortic aneurysm or dissection. Moderate soft plaque in the proximal left subclavian artery causing greater than 50% luminal stenosis. No acute cardiopulmonary disease. Dependent atelectasis in the lower lobes. No acute findings in the abdomen or pelvis. Electronically Signed   By: Charlett Nose M.D.   On: 05/26/2018 00:35       Today   Subjective:   Terri Berry feels little nauseous wants to go home Objective:   Blood pressure 126/84, pulse 72, temperature 98.6 F (37 C), temperature source Oral, resp. rate 20, height  (1.702 m), weight 90.7 kg, SpO2 98 %.  .  Intake/Output Summary  (Last 24 hours) at 05/28/2018 0940 Last data filed at 05/27/2018 2219 Gross per 24 hour  Intake 1749.44 ml  Output 300 ml  Net 1449.44 ml    Exam VITAL SIGNS: Blood pressure 126/84, pulse 72, temperature 98.6 F (37 C), temperature source Oral, resp. rate 20, height  (1.702 m), weight 90.7 kg, SpO2 98 %.  GENERAL:  43 y.o.-year-old patient lying in the bed with no acute distress.  EYES: Pupils equal, round, reactive to light and accommodation. No scleral icterus. Extraocular muscles intact.  HEENT: Head atraumatic, normocephalic. Oropharynx and nasopharynx clear.  NECK:  Supple, no jugular venous distention. No thyroid enlargement, no tenderness.  LUNGS: Normal breath sounds bilaterally, no wheezing, rales,rhonchi or crepitation. No use of accessory muscles of respiration.  CARDIOVASCULAR: S1, S2 normal. No murmurs, rubs, or gallops.  ABDOMEN: Soft, nontender, nondistended. Bowel sounds present. No organomegaly or mass.  EXTREMITIES: No pedal edema, cyanosis, or clubbing.  NEUROLOGIC: Cranial nerves II through XII are intact. Muscle strength 5/5 in all extremities.  Right lower extremity toe dressing in place PSYCHIATRIC: The patient is alert and oriented x 3.  SKIN: No obvious rash, lesion, or ulcer.   Data Review     CBC w Diff:  Lab Results  Component Value Date   WBC 6.5 05/27/2018   HGB 8.1 (L) 05/27/2018   HGB 10.5 (L) 03/10/2018   HCT 27.8 (L) 05/27/2018   HCT 31.6 (L) 03/10/2018   PLT 332 05/27/2018   PLT 328 03/10/2018   LYMPHOPCT 24 05/27/2018   MONOPCT 6 05/27/2018   EOSPCT 4 05/27/2018   BASOPCT 1 05/27/2018   CMP:  Lab Results  Component Value Date   NA 140 05/27/2018   NA 140 03/10/2018   K 3.7 05/27/2018   CL  105 05/27/2018   CO2 25 05/27/2018   BUN 11 05/27/2018   BUN 13 03/10/2018   CREATININE 1.05 (H) 05/27/2018   PROT 7.4 05/25/2018   PROT 7.1 03/10/2018   ALBUMIN 3.8 05/25/2018   ALBUMIN 4.4 03/10/2018   BILITOT <0.1 (L) 05/25/2018    BILITOT <0.2 03/10/2018   ALKPHOS 54 05/25/2018   AST 15 05/25/2018   ALT 11 05/25/2018  .  Micro Results No results found for this or any previous visit (from the past 240 hour(s)).      Code Status Orders  (From admission, onward)         Start     Ordered   05/26/18 0212  Full code  Continuous     05/26/18 0211        Code Status History    Date Active Date Inactive Code Status Order ID Comments User Context   03/18/2018 1409 03/27/2018 2130 Full Code [id number]  Milagros LollSudini, Srikar, MD ED   02/09/2018 0921 02/15/2018 1549 Full Code [id number]  Barbaraann RondoSridharan, Prasanna, MD Inpatient   11/28/2017 0352 12/04/2017 2009 Full Code [id number]  Barbaraann RondoSridharan, Prasanna, MD Inpatient          Follow-up Information    Schnier, Latina CraverGregory G, MD Follow up in 3 month(s).   Specialties:  Vascular Surgery, Cardiology, Radiology, Vascular Surgery Why:  Seen as consult. Can see Schnier or midlevel. Will need left upper extremity arterial duplex with visit.  Contact information: [address] [place] [address] [phone]        Thomes DinningWeeks, Cynthia, MD Follow up in 6 day(s).   Specialty:  Family Medicine Contact information: [address] [place] [id number] [phone]           Discharge Medications   Allergies as of 05/28/2018      Reactions   Precedex [dexmedetomidine Hcl In Nacl] Other (See Comments)   Significant Bradycardia   Cefpodoxime    Tolerates augmentin and Keflex    Clarithromycin    Doxycycline    Ferrous Gluconate    Lactose Intolerance (gi)    Levofloxacin    Rizatriptan    Sulfa Antibiotics    Topiramate    Venlafaxine    Zofran [ondansetron Hcl]       Medication List    STOP taking these medications   amLODipine 10 MG tablet Commonly known as:  NORVASC   fluconazole 400-0.9 MG/200ML-% IVPB Commonly known as:  DIFLUCAN   furosemide 20 MG tablet Commonly known as:  LASIX   heparin 25000-0.45 UT/250ML-% infusion   hydrALAZINE 20 MG/ML  injection Commonly known as:  APRESOLINE   insulin aspart 100 UNIT/ML injection Commonly known as:  novoLOG   insulin glargine 100 UNIT/ML injection Commonly known as:  LANTUS   ketorolac 10 MG tablet Commonly known as:  TORADOL   labetalol 5 MG/ML injection Commonly known as:  NORMODYNE   lisinopril 20 MG tablet Commonly known as:  ZESTRIL   LORazepam 2 MG/ML injection Commonly known as:  ATIVAN   meropenem 1 g in sodium chloride 0.9 % 100 mL   metoCLOPramide 5 MG/ML injection Commonly known as:  REGLAN   morphine 2 MG/ML injection     TAKE these medications   acetaminophen 325 MG tablet Commonly known as:  TYLENOL Take 2 tablets (650 mg total) by mouth every 6 (six) hours as needed for mild pain (or Fever >/= 101).   albuterol 108 (90 Base) MCG/ACT inhaler Commonly known as:  VENTOLIN HFA Inhale  2 puffs into the lungs every 6 (six) hours as needed for wheezing or shortness of breath.   amoxicillin-clavulanate 875-125 MG tablet Commonly known as:  Augmentin Take 1 tablet by mouth 2 (two) times daily for 6 days.   apixaban 5 MG Tabs tablet Commonly known as:  ELIQUIS Take 5 mg by mouth every 12 (twelve) hours.   atorvastatin 40 MG tablet Commonly known as:  LIPITOR Take 40 mg by mouth daily.   busPIRone 10 MG tablet Commonly known as:  BUSPAR Take 10 mg by mouth 3 (three) times daily.   clonazePAM 1 MG tablet Commonly known as:  KLONOPIN Take 1 mg by mouth 3 (three) times daily.   dicyclomine 10 MG capsule Commonly known as:  BENTYL Take 10 mg by mouth 3 (three) times daily as needed for spasms.   fenofibrate 160 MG tablet Take 160 mg by mouth daily.   FLUoxetine 20 MG capsule Commonly known as:  PROZAC Take 40 mg by mouth daily.   Melatonin 3 MG Tabs Take 6 mg by mouth at bedtime.   midodrine 10 MG tablet Commonly known as:  PROAMATINE Take 1 tablet (10 mg total) by mouth 3 (three) times daily with meals.   mirtazapine 30 MG  tablet Commonly known as:  REMERON Take 30 mg by mouth at bedtime.   omeprazole 20 MG capsule Commonly known as:  PRILOSEC Take 40 mg by mouth 2 (two) times daily.   Pancrelipase (Lip-Prot-Amyl) 24000-76000 units Cpep Take 2 capsules by mouth 3 (three) times daily with meals.   pregabalin 200 MG capsule Commonly known as:  LYRICA Take 200 mg by mouth 3 (three) times daily.   prochlorperazine 10 MG tablet Commonly known as:  COMPAZINE Take 10 mg by mouth daily.   promethazine 25 MG tablet Commonly known as:  PHENERGAN Take 25 mg by mouth at bedtime as needed for nausea. What changed:  Another medication with the same name was removed. Continue taking this medication, and follow the directions you see here.   senna 8.6 MG Tabs tablet Commonly known as:  SENOKOT Take 4 tablets by mouth daily.   tiZANidine 4 MG tablet Commonly known as:  ZANAFLEX Take 4 mg by mouth 2 (two) times daily as needed for muscle spasms.   Vitamin D (Ergocalciferol) 1.25 MG (50000 UT) Caps capsule Commonly known as:  DRISDOL Take 50,000 Units by mouth once a week.          Total Time in preparing paper work, data evaluation and todays exam - 35 minutes  Auburn Bilberry M.D on 05/28/2018 at 9:40 AM [facility]   Office  [phone]

## 2018-05-30 ENCOUNTER — Emergency Department
Admission: EM | Admit: 2018-05-30 | Discharge: 2018-05-30 | Disposition: A | Discharge status: Home / Self Care | Payer: Medicaid Other | Attending: Emergency Medicine | Admitting: Emergency Medicine

## 2018-05-30 DIAGNOSIS — R400 Somnolence: Secondary | ICD-10-CM | POA: Diagnosis not present

## 2018-05-30 DIAGNOSIS — I10 Essential (primary) hypertension: Secondary | ICD-10-CM | POA: Insufficient documentation

## 2018-05-30 DIAGNOSIS — F17210 Nicotine dependence, cigarettes, uncomplicated: Secondary | ICD-10-CM | POA: Diagnosis not present

## 2018-05-30 DIAGNOSIS — E119 Type 2 diabetes mellitus without complications: Secondary | ICD-10-CM | POA: Diagnosis not present

## 2018-05-30 DIAGNOSIS — R55 Syncope and collapse: Secondary | ICD-10-CM | POA: Diagnosis present

## 2018-05-30 DIAGNOSIS — Z7901 Long term (current) use of anticoagulants: Secondary | ICD-10-CM | POA: Insufficient documentation

## 2018-05-30 DIAGNOSIS — E860 Dehydration: Secondary | ICD-10-CM | POA: Diagnosis not present

## 2018-05-30 DIAGNOSIS — Z79899 Other long term (current) drug therapy: Secondary | ICD-10-CM | POA: Insufficient documentation

## 2018-05-30 LAB — BASIC METABOLIC PANEL
Anion gap: 11 (ref 5–15)
BUN: 18 mg/dL (ref 6–20)
CO2: 23 mmol/L (ref 22–32)
Calcium: 8.7 mg/dL — ABNORMAL LOW (ref 8.9–10.3)
Chloride: 103 mmol/L (ref 98–111)
Creatinine, Ser: 1.54 mg/dL — ABNORMAL HIGH (ref 0.44–1.00)
GFR calc Af Amer: 47 mL/min — ABNORMAL LOW (ref >60)
GFR calc non Af Amer: 41 mL/min — ABNORMAL LOW (ref >60)
Glucose, Bld: 227 mg/dL — ABNORMAL HIGH (ref 70–99)
Potassium: 3.6 mmol/L (ref 3.5–5.1)
Sodium: 137 mmol/L (ref 135–145)

## 2018-05-30 LAB — CBC
HCT: 27.1 % — ABNORMAL LOW (ref 36.0–46.0)
Hemoglobin: 8.1 g/dL — ABNORMAL LOW (ref 12.0–15.0)
MCH: 22.9 pg — ABNORMAL LOW (ref 26.0–34.0)
MCHC: 29.9 g/dL — ABNORMAL LOW (ref 30.0–36.0)
MCV: 76.8 fL — ABNORMAL LOW (ref 80.0–100.0)
Platelets: 397 10*3/uL (ref 150–400)
RBC: 3.53 MIL/uL — ABNORMAL LOW (ref 3.87–5.11)
RDW: 15.9 % — ABNORMAL HIGH (ref 11.5–15.5)
WBC: 12.8 10*3/uL — ABNORMAL HIGH (ref 4.0–10.5)
nRBC: 0 % (ref 0.0–0.2)

## 2018-05-30 LAB — URINALYSIS, COMPLETE (UACMP) WITH MICROSCOPIC
Bacteria, UA: NONE SEEN
Bilirubin Urine: NEGATIVE
Glucose, UA: NEGATIVE mg/dL
Hgb urine dipstick: NEGATIVE
Ketones, ur: NEGATIVE mg/dL
Leukocytes,Ua: NEGATIVE
Nitrite: NEGATIVE
Protein, ur: NEGATIVE mg/dL
Specific Gravity, Urine: 1.011 (ref 1.005–1.030)
pH: 5 (ref 5.0–8.0)

## 2018-05-30 MED ORDER — SODIUM CHLORIDE 0.9 % IV BOLUS
1000.0000 mL | Freq: Once | INTRAVENOUS | Status: AC
  Administered 2018-05-30: 1000 mL via INTRAVENOUS

## 2018-05-30 NOTE — ED Notes (Signed)
POC PREG NEG 

## 2018-05-30 NOTE — ED Triage Notes (Signed)
Patient coming acems from home for syncope. Patient somnolent, arouseable to voice in triage. Patient orthostatic for EMS. Patient hypotensive and bradycardic in triage.

## 2018-05-30 NOTE — ED Provider Notes (Addendum)
[facility] Emergency Department Provider Note  ____________________________________________  Time seen: Approximately 3:31 AM  I have reviewed the triage vital signs and the nursing notes.   HISTORY  Chief Complaint Loss of Consciousness    HPI Terri Berry is a 43 y.o. female with a history of anxiety depression diabetes and GERD who is brought to the ED today for reported syncope.  However, apparently she had taken a large dose of melatonin at home and then was sitting at her dining table eating ice when she became sleepy and fell asleep on the table.  She denies loss of consciousness and passing out and falling.  She is somnolent currently but arousable.  Denies any pain.  She does report that she does not drink water and only drinks sweet tea.  Had a recent hospitalization for hypotension, was started on Midrin by hospitalist after the rest of the work-up was unremarkable.  She had a normal cortisol stim test      Past Medical History:  Diagnosis Date  . Allergy   . Anxiety   . Depression   . Diabetes (HCC)   . GERD (gastroesophageal reflux disease)   . GERD (gastroesophageal reflux disease)   . Hypertension   . IBS (irritable bowel syndrome)      Patient Active Problem List   Diagnosis Date Noted  . Hypotension 05/26/2018  . Subclavian arterial stenosis (HCC) 05/26/2018  . Diabetes (HCC) 05/26/2018  . GERD (gastroesophageal reflux disease) 05/25/2018  . HTN (hypertension) 05/25/2018  . Anxiety 05/25/2018  . AKI (acute kidney injury) (HCC) 05/25/2018  . Distended abdomen   . Acute pancreatitis 03/18/2018  . Acute hypoxemic respiratory failure (HCC) 02/09/2018  . Overdose 11/28/2017     Past Surgical History:  Procedure Laterality Date  . ABDOMINAL HYSTERECTOMY    . GALLBLADDER SURGERY       Prior to Admission medications   Medication Sig Start Date End Date Taking? Authorizing Provider  acetaminophen (TYLENOL) 325 MG tablet  Take 2 tablets (650 mg total) by mouth every 6 (six) hours as needed for mild pain (or Fever >/= 101). Patient not taking: Reported on 05/26/2018 03/27/18   Mayo, Allyn Kenner, MD  albuterol (PROVENTIL HFA;VENTOLIN HFA) 108 (90 Base) MCG/ACT inhaler Inhale 2 puffs into the lungs every 6 (six) hours as needed for wheezing or shortness of breath. 02/15/18   Shaune Pollack, MD  amoxicillin-clavulanate (AUGMENTIN) 875-125 MG tablet Take 1 tablet by mouth 2 (two) times daily for 6 days. 05/28/18 06/03/18  Auburn Bilberry, MD  apixaban (ELIQUIS) 5 MG TABS tablet Take 5 mg by mouth every 12 (twelve) hours. 04/20/18 06/19/18  [provider]  atorvastatin (LIPITOR) 40 MG tablet Take 40 mg by mouth daily. 05/07/18 11/03/18  [provider]  busPIRone (BUSPAR) 10 MG tablet Take 10 mg by mouth 3 (three) times daily. 04/20/18 11/03/18  [provider]  clonazePAM (KLONOPIN) 1 MG tablet Take 1 mg by mouth 3 (three) times daily. 05/07/18 08/05/18  [provider]  dicyclomine (BENTYL) 10 MG capsule Take 10 mg by mouth 3 (three) times daily as needed for spasms. 05/11/18 05/11/19  [provider]  fenofibrate 160 MG tablet Take 160 mg by mouth daily. 05/07/18 11/03/18  [provider]  FLUoxetine (PROZAC) 20 MG capsule Take 40 mg by mouth daily.  05/07/18 11/03/18  [provider]  Melatonin 3 MG TABS Take 6 mg by mouth at bedtime.    [provider]  midodrine (PROAMATINE) 10  MG tablet Take 1 tablet (10 mg total) by mouth 3 (three) times daily with meals. 05/28/18   Auburn BilberryPatel, Shreyang, MD  mirtazapine (REMERON) 30 MG tablet Take 30 mg by mouth at bedtime. 05/19/18   [provider]  omeprazole (PRILOSEC) 20 MG capsule Take 40 mg by mouth 2 (two) times daily. 05/07/18   [provider]  Pancrelipase, Lip-Prot-Amyl, 24000-76000 units CPEP Take 2 capsules by mouth 3 (three) times daily with meals.    [provider]  pregabalin (LYRICA) 200 MG capsule Take  200 mg by mouth 3 (three) times daily. 05/07/18   [provider]  prochlorperazine (COMPAZINE) 10 MG tablet Take 10 mg by mouth daily. 04/20/18   [provider]  promethazine (PHENERGAN) 25 MG tablet Take 25 mg by mouth at bedtime as needed for nausea. 04/22/18   [provider]  senna (SENOKOT) 8.6 MG TABS tablet Take 4 tablets by mouth daily.    [provider]  tiZANidine (ZANAFLEX) 4 MG tablet Take 4 mg by mouth 2 (two) times daily as needed for muscle spasms. 05/22/18 05/22/19  [provider]  Vitamin D, Ergocalciferol, (DRISDOL) 1.25 MG (50000 UT) CAPS capsule Take 50,000 Units by mouth once a week. 04/20/18 07/24/18  [provider]     Allergies Precedex [dexmedetomidine hcl in nacl]; Cefpodoxime; Clarithromycin; Doxycycline; Ferrous gluconate; Lactose intolerance (gi); Levofloxacin; Rizatriptan; Sulfa antibiotics; Topiramate; Venlafaxine; and Zofran [ondansetron hcl]   Family History  Problem Relation Age of Onset  . Diabetes Father   . Heart disease Father   . Kidney disease Father   . COPD Father   . Cancer Paternal Aunt   . Diabetes Paternal Uncle   . Heart disease Paternal Uncle     Social History Social History   Tobacco Use  . Smoking status: Current Every Day Smoker    Packs/day: 0.50    Types: Cigarettes  . Smokeless tobacco: Never Used  Substance Use Topics  . Alcohol use: Not Currently  . Drug use: Not Currently    Review of Systems  Constitutional:   No fever or chills.  ENT:   No sore throat. No rhinorrhea. Cardiovascular:   No chest pain or syncope. Respiratory:   No dyspnea or cough. Gastrointestinal:   Negative for abdominal pain, vomiting and diarrhea.  Musculoskeletal:   Negative for focal pain or swelling All other systems reviewed and are negative except as documented above in ROS and HPI.  ____________________________________________   PHYSICAL EXAM:  VITAL SIGNS: ED Triage Vitals  [05/30/18 0155]  Enc Vitals Group     BP 91/66     Pulse Rate (!) 47     Resp 14     Temp 98 F (36.7 C)     Temp src      SpO2 99 %     Weight 200 lb 9.9 oz (91 kg)     Height      Head Circumference      Peak Flow      Pain Score 3     Pain Loc      Pain Edu?      Excl. in GC?     Vital signs reviewed, nursing assessments reviewed.   Constitutional:   Alert and oriented. Non-toxic appearance. Eyes:   Conjunctivae are normal. EOMI. PERRL. ENT      Head:   Normocephalic and atraumatic.      Nose:   No congestion/rhinnorhea.       Mouth/Throat:  MMM, no pharyngeal erythema. No peritonsillar mass.       Neck:   No meningismus. Full ROM. Hematological/Lymphatic/Immunilogical:   No cervical lymphadenopathy. Cardiovascular:   RRR. Symmetric bilateral radial and DP pulses.  No murmurs. Cap refill less than 2 seconds. Respiratory:   Normal respiratory effort without tachypnea/retractions. Breath sounds are clear and equal bilaterally. No wheezes/rales/rhonchi. Gastrointestinal:   Soft and nontender. Non distended. There is no CVA tenderness.  No rebound, rigidity, or guarding. Musculoskeletal:   Normal range of motion in all extremities. No joint effusions.  No lower extremity tenderness.  No edema. Neurologic:   Normal speech and language.  Motor grossly intact. No acute focal neurologic deficits are appreciated.  Skin:    Skin is warm, dry and intact. No rash noted.  No petechiae, purpura, or bullae.  ____________________________________________    LABS (pertinent positives/negatives) (all labs ordered are listed, but only abnormal results are displayed) Labs Reviewed  BASIC METABOLIC PANEL - Abnormal; Notable for the following components:      Result Value   Glucose, Bld 227 (*)    Creatinine, Ser 1.54 (*)    Calcium 8.7 (*)    GFR calc non Af Amer 41 (*)    GFR calc Af Amer 47 (*)    All other components within normal limits  CBC - Abnormal; Notable for the  following components:   WBC 12.8 (*)    RBC 3.53 (*)    Hemoglobin 8.1 (*)    HCT 27.1 (*)    MCV 76.8 (*)    MCH 22.9 (*)    MCHC 29.9 (*)    RDW 15.9 (*)    All other components within normal limits  URINALYSIS, COMPLETE (UACMP) WITH MICROSCOPIC - Abnormal; Notable for the following components:   Color, Urine YELLOW (*)    APPearance CLEAR (*)    All other components within normal limits  POC URINE PREG, ED  CBG MONITORING, ED   ____________________________________________   EKG  Interpreted by me Sinus rhythm rate of 57, normal axis intervals QRS ST segments and T waves.  ____________________________________________    RADIOLOGY  No results found.  ____________________________________________   PROCEDURES Procedures  ____________________________________________    CLINICAL IMPRESSION / ASSESSMENT AND PLAN / ED COURSE  Medications ordered in the ED: Medications  sodium chloride 0.9 % bolus 1,000 mL (1,000 mLs Intravenous New Bag/Given 05/30/18 0233)    Pertinent labs & imaging results that were available during my care of the patient were reviewed by me and considered in my medical decision making (see chart for details).  Terri Berry was evaluated in Emergency Department on 05/30/2018 for the symptoms described in the history of present illness. She was evaluated in the context of the global COVID-19 pandemic, which necessitated consideration that the patient might be at risk for infection with the SARS-CoV-2 virus that causes COVID-19. Institutional protocols and algorithms that pertain to the evaluation of patients at risk for COVID-19 are in a state of rapid change based on information released by regulatory bodies including the CDC and federal and state organizations. These policies and algorithms were followed during the patient's care in the ED.   Patient presents with somnolence after taking melatonin.  No acute symptoms, no reason to suspect acute  neurologic vascular or cardiopulmonary pathology.  Doubt ACS PE dissection AAA pneumothorax pericarditis stroke carotid dissection vertebral dissection or occlusion meningitis or encephalitis.  She is calm and comfortable, vital signs unremarkable in the ED.  She is  not bradycardic on my exam, blood pressure on my exam was 115/80.  Recommend she continue her home medications and follow-up with her doctor.  Encouraged her to drink more fluids.  She was given a liter of saline for hydration in the ED which will help with her blood pressure as well as her slight acute renal insufficiency which is a recurrent issue for her.      ____________________________________________   FINAL CLINICAL IMPRESSION(S) / ED DIAGNOSES    Final diagnoses:  Somnolence  Dehydration     ED Discharge Orders    None      Portions of this note were generated with dragon dictation software. Dictation errors may occur despite best attempts at proofreading.   Sharman Cheek, MD 05/30/18 1610    Sharman Cheek, MD 05/30/18 [phone]

## 2018-05-30 NOTE — Discharge Instructions (Addendum)
Your lab test today show that you are a little bit dehydrated again.  Be sure to drink plenty of water and follow-up with your doctor.  It would be best to wait until you are ready to go to sleep to take your sleeping medicine at night.

## 2018-07-02 ENCOUNTER — Encounter: Payer: Self-pay | Admitting: Emergency Medicine

## 2018-07-02 ENCOUNTER — Other Ambulatory Visit: Payer: Self-pay

## 2018-07-02 ENCOUNTER — Emergency Department: Payer: Medicaid Other

## 2018-07-02 ENCOUNTER — Observation Stay
Admission: EM | Admit: 2018-07-02 | Discharge: 2018-07-03 | Disposition: A | Discharge status: Home / Self Care | Payer: Medicaid Other | Attending: Internal Medicine | Admitting: Internal Medicine

## 2018-07-02 DIAGNOSIS — Z794 Long term (current) use of insulin: Secondary | ICD-10-CM | POA: Diagnosis not present

## 2018-07-02 DIAGNOSIS — R42 Dizziness and giddiness: Secondary | ICD-10-CM | POA: Diagnosis present

## 2018-07-02 DIAGNOSIS — I10 Essential (primary) hypertension: Secondary | ICD-10-CM | POA: Insufficient documentation

## 2018-07-02 DIAGNOSIS — F17210 Nicotine dependence, cigarettes, uncomplicated: Secondary | ICD-10-CM | POA: Diagnosis not present

## 2018-07-02 DIAGNOSIS — E860 Dehydration: Secondary | ICD-10-CM | POA: Insufficient documentation

## 2018-07-02 DIAGNOSIS — Z886 Allergy status to analgesic agent status: Secondary | ICD-10-CM | POA: Diagnosis not present

## 2018-07-02 DIAGNOSIS — R55 Syncope and collapse: Principal | ICD-10-CM | POA: Insufficient documentation

## 2018-07-02 DIAGNOSIS — K589 Irritable bowel syndrome without diarrhea: Secondary | ICD-10-CM | POA: Insufficient documentation

## 2018-07-02 DIAGNOSIS — N179 Acute kidney failure, unspecified: Secondary | ICD-10-CM | POA: Insufficient documentation

## 2018-07-02 DIAGNOSIS — Z79899 Other long term (current) drug therapy: Secondary | ICD-10-CM | POA: Insufficient documentation

## 2018-07-02 DIAGNOSIS — Z881 Allergy status to other antibiotic agents status: Secondary | ICD-10-CM | POA: Insufficient documentation

## 2018-07-02 DIAGNOSIS — Z888 Allergy status to other drugs, medicaments and biological substances status: Secondary | ICD-10-CM | POA: Diagnosis not present

## 2018-07-02 DIAGNOSIS — Z1159 Encounter for screening for other viral diseases: Secondary | ICD-10-CM | POA: Diagnosis not present

## 2018-07-02 DIAGNOSIS — I959 Hypotension, unspecified: Secondary | ICD-10-CM | POA: Diagnosis not present

## 2018-07-02 DIAGNOSIS — K219 Gastro-esophageal reflux disease without esophagitis: Secondary | ICD-10-CM | POA: Insufficient documentation

## 2018-07-02 DIAGNOSIS — Z8249 Family history of ischemic heart disease and other diseases of the circulatory system: Secondary | ICD-10-CM | POA: Diagnosis not present

## 2018-07-02 DIAGNOSIS — E119 Type 2 diabetes mellitus without complications: Secondary | ICD-10-CM | POA: Insufficient documentation

## 2018-07-02 DIAGNOSIS — K8500 Idiopathic acute pancreatitis without necrosis or infection: Secondary | ICD-10-CM | POA: Insufficient documentation

## 2018-07-02 DIAGNOSIS — F329 Major depressive disorder, single episode, unspecified: Secondary | ICD-10-CM | POA: Insufficient documentation

## 2018-07-02 DIAGNOSIS — Z833 Family history of diabetes mellitus: Secondary | ICD-10-CM | POA: Diagnosis not present

## 2018-07-02 DIAGNOSIS — R7989 Other specified abnormal findings of blood chemistry: Secondary | ICD-10-CM | POA: Diagnosis not present

## 2018-07-02 DIAGNOSIS — Z7901 Long term (current) use of anticoagulants: Secondary | ICD-10-CM | POA: Insufficient documentation

## 2018-07-02 DIAGNOSIS — Z882 Allergy status to sulfonamides status: Secondary | ICD-10-CM | POA: Insufficient documentation

## 2018-07-02 DIAGNOSIS — F419 Anxiety disorder, unspecified: Secondary | ICD-10-CM | POA: Diagnosis not present

## 2018-07-02 LAB — CBC
HCT: 35.4 % — ABNORMAL LOW (ref 36.0–46.0)
Hemoglobin: 10.5 g/dL — ABNORMAL LOW (ref 12.0–15.0)
MCH: 21.8 pg — ABNORMAL LOW (ref 26.0–34.0)
MCHC: 29.7 g/dL — ABNORMAL LOW (ref 30.0–36.0)
MCV: 73.6 fL — ABNORMAL LOW (ref 80.0–100.0)
Platelets: 243 10*3/uL (ref 150–400)
RBC: 4.81 MIL/uL (ref 3.87–5.11)
RDW: 17 % — ABNORMAL HIGH (ref 11.5–15.5)
WBC: 9.4 10*3/uL (ref 4.0–10.5)
nRBC: 0 % (ref 0.0–0.2)

## 2018-07-02 LAB — COMPREHENSIVE METABOLIC PANEL
ALT: 18 U/L (ref 0–44)
AST: 29 U/L (ref 15–41)
Albumin: 4.2 g/dL (ref 3.5–5.0)
Alkaline Phosphatase: 50 U/L (ref 38–126)
Anion gap: 14 (ref 5–15)
BUN: 27 mg/dL — ABNORMAL HIGH (ref 6–20)
CO2: 22 mmol/L (ref 22–32)
Calcium: 9.6 mg/dL (ref 8.9–10.3)
Chloride: 98 mmol/L (ref 98–111)
Creatinine, Ser: 2.2 mg/dL — ABNORMAL HIGH (ref 0.44–1.00)
GFR calc Af Amer: 31 mL/min — ABNORMAL LOW (ref >60)
GFR calc non Af Amer: 26 mL/min — ABNORMAL LOW (ref >60)
Glucose, Bld: 122 mg/dL — ABNORMAL HIGH (ref 70–99)
Potassium: 3.6 mmol/L (ref 3.5–5.1)
Sodium: 134 mmol/L — ABNORMAL LOW (ref 135–145)
Total Bilirubin: 0.5 mg/dL (ref 0.3–1.2)
Total Protein: 8.1 g/dL (ref 6.5–8.1)

## 2018-07-02 LAB — URINALYSIS, COMPLETE (UACMP) WITH MICROSCOPIC
Bilirubin Urine: NEGATIVE
Glucose, UA: NEGATIVE mg/dL
Hgb urine dipstick: NEGATIVE
Ketones, ur: NEGATIVE mg/dL
Leukocytes,Ua: NEGATIVE
Nitrite: NEGATIVE
Protein, ur: NEGATIVE mg/dL
Specific Gravity, Urine: 1.003 — ABNORMAL LOW (ref 1.005–1.030)
pH: 6 (ref 5.0–8.0)

## 2018-07-02 LAB — SARS CORONAVIRUS 2 BY RT PCR (HOSPITAL ORDER, PERFORMED IN ~~LOC~~ HOSPITAL LAB): SARS Coronavirus 2: NEGATIVE

## 2018-07-02 LAB — TROPONIN I: Troponin I: <0.03 ng/mL (ref <0.03)

## 2018-07-02 MED ORDER — PREGABALIN 75 MG PO CAPS
200.0000 mg | ORAL_CAPSULE | Freq: Three times a day (TID) | ORAL | Status: DC
  Administered 2018-07-03 (×2): 200 mg via ORAL
  Filled 2018-07-02 (×3): qty 1

## 2018-07-02 MED ORDER — MELATONIN 5 MG PO TABS
5.0000 mg | ORAL_TABLET | Freq: Every day | ORAL | Status: DC
  Administered 2018-07-03: 5 mg via ORAL
  Filled 2018-07-02 (×2): qty 1

## 2018-07-02 MED ORDER — ACETAMINOPHEN 650 MG RE SUPP: 650.0000 mg | Freq: Four times a day (QID) | RECTAL | Status: DC | PRN

## 2018-07-02 MED ORDER — SODIUM CHLORIDE 0.9 % IV BOLUS
1000.0000 mL | Freq: Once | INTRAVENOUS | Status: AC
  Administered 2018-07-02: 1000 mL via INTRAVENOUS

## 2018-07-02 MED ORDER — ACETAMINOPHEN 325 MG PO TABS
650.0000 mg | ORAL_TABLET | Freq: Four times a day (QID) | ORAL | Status: DC | PRN
  Administered 2018-07-03: 650 mg via ORAL
  Filled 2018-07-02: qty 2

## 2018-07-02 MED ORDER — PANTOPRAZOLE SODIUM 20 MG PO TBEC
20.0000 mg | DELAYED_RELEASE_TABLET | Freq: Two times a day (BID) | ORAL | Status: DC
  Administered 2018-07-03: 20 mg via ORAL
  Filled 2018-07-02 (×2): qty 1

## 2018-07-02 MED ORDER — CLONAZEPAM 1 MG PO TABS
1.0000 mg | ORAL_TABLET | Freq: Three times a day (TID) | ORAL | Status: DC
  Administered 2018-07-03 (×2): 1 mg via ORAL
  Filled 2018-07-02 (×2): qty 1

## 2018-07-02 MED ORDER — ATORVASTATIN CALCIUM 20 MG PO TABS
40.0000 mg | ORAL_TABLET | Freq: Every day | ORAL | Status: DC
  Administered 2018-07-03: 40 mg via ORAL
  Filled 2018-07-02: qty 2

## 2018-07-02 MED ORDER — BUSPIRONE HCL 10 MG PO TABS
10.0000 mg | ORAL_TABLET | Freq: Three times a day (TID) | ORAL | Status: DC
  Administered 2018-07-03 (×2): 10 mg via ORAL
  Filled 2018-07-02 (×4): qty 1

## 2018-07-02 MED ORDER — PROCHLORPERAZINE EDISYLATE 10 MG/2ML IJ SOLN
5.0000 mg | INTRAMUSCULAR | Status: DC | PRN
  Filled 2018-07-02 (×2): qty 1

## 2018-07-02 MED ORDER — SODIUM CHLORIDE 0.9 % IV SOLN
INTRAVENOUS | Status: AC
  Administered 2018-07-02: via INTRAVENOUS

## 2018-07-02 MED ORDER — INSULIN ASPART 100 UNIT/ML ~~LOC~~ SOLN
0.0000 [IU] | Freq: Three times a day (TID) | SUBCUTANEOUS | Status: DC
  Administered 2018-07-03: 2 [IU] via SUBCUTANEOUS
  Filled 2018-07-02: qty 1

## 2018-07-02 MED ORDER — MIRTAZAPINE 15 MG PO TABS
30.0000 mg | ORAL_TABLET | Freq: Every day | ORAL | Status: DC
  Administered 2018-07-03: 30 mg via ORAL
  Filled 2018-07-02: qty 2

## 2018-07-02 MED ORDER — MECLIZINE HCL 25 MG PO TABS
25.0000 mg | ORAL_TABLET | Freq: Once | ORAL | Status: AC
  Administered 2018-07-02: 25 mg via ORAL
  Filled 2018-07-02: qty 1

## 2018-07-02 MED ORDER — APIXABAN 5 MG PO TABS
5.0000 mg | ORAL_TABLET | Freq: Two times a day (BID) | ORAL | Status: DC
  Administered 2018-07-03 (×2): 5 mg via ORAL
  Filled 2018-07-02 (×2): qty 1

## 2018-07-02 MED ORDER — FLUOXETINE HCL 20 MG PO CAPS
60.0000 mg | ORAL_CAPSULE | Freq: Every day | ORAL | Status: DC
  Administered 2018-07-03: 60 mg via ORAL
  Filled 2018-07-02: qty 3

## 2018-07-02 MED ORDER — PANCRELIPASE (LIP-PROT-AMYL) 12000-38000 UNITS PO CPEP
24000.0000 [IU] | ORAL_CAPSULE | Freq: Three times a day (TID) | ORAL | Status: DC
  Administered 2018-07-03 (×2): 24000 [IU] via ORAL
  Filled 2018-07-02 (×3): qty 2

## 2018-07-02 NOTE — ED Notes (Signed)
ED TO INPATIENT HANDOFF REPORT  ED Nurse Name and Phone #:  Gwynneth Munson [phone]  S Name/Age/Gender Terri Berry 43 y.o. female Room/Bed: ED06A/ED06A  Code Status   Code Status: Prior  Home/SNF/Other Home Patient oriented to: self, place, time and situation Is this baseline? Yes   Triage Complete: Triage complete  Chief Complaint ems  Triage Note Pt presents to ED via [facility] from home c/o hypotension. EMS report initial SBP 60s, glucose 157. Per EMS pt had similar episode and was admitted to hospital several weeks ago and was told it was due to poor oral intake and doubling her BP meds at home. Pt states she has not been drinking well because "I've been sick with my chest hurting." States she did take meds last night but not today.    Allergies Allergies  Allergen Reactions  . Precedex [Dexmedetomidine Hcl In Nacl] Other (See Comments)    Significant Bradycardia  . Aspirin     Not indicated due to other anticoagulant use.  Marland Kitchen Cefpodoxime     Tolerates augmentin and Keflex   . Clarithromycin   . Doxycycline   . Ferrous Gluconate   . Lactose Intolerance (Gi)   . Levofloxacin   . Rizatriptan   . Sulfa Antibiotics   . Topiramate   . Venlafaxine   . Zofran [Ondansetron Hcl]     Level of Care/Admitting Diagnosis ED Disposition    ED Disposition Condition Comment   Admit  Hospital Area: [facility] [[id number]]  Level of Care: Med-Surg [16]  Covid Evaluation: Confirmed COVID Negative  Diagnosis: Hypotension [147829]  Admitting Physician: Oralia Manis [[id number]]  Attending Physician: Oralia Manis [phone]  Estimated length of stay: past midnight tomorrow  Certification:: I certify this patient will need inpatient services for at least 2 midnights  PT Class (Do Not Modify): Inpatient [101]  PT Acc Code (Do Not Modify): Private [1]       B Medical/Surgery History Past Medical History:  Diagnosis Date  . Allergy   . Anxiety   . Depression   .  Diabetes (HCC)   . GERD (gastroesophageal reflux disease)   . GERD (gastroesophageal reflux disease)   . Hypertension   . IBS (irritable bowel syndrome)    Past Surgical History:  Procedure Laterality Date  . ABDOMINAL HYSTERECTOMY    . GALLBLADDER SURGERY       A IV Location/Drains/Wounds Patient Lines/Drains/Airways Status   Active Line/Drains/Airways    Name:   Placement date:   Placement time:   Site:   Days:   Peripheral IV 07/02/18 Right;Posterior Forearm   07/02/18    1415    Forearm   less than 1   Peripheral IV 07/02/18 Left Hand   07/02/18    1420    Hand   less than 1   Wound / Incision (Open or Dehisced) 05/25/18 Non-pressure wound Toe (Comment  which one) Anterior;Right;Medial abrasion with drainage   05/25/18    2301    Toe (Comment  which one)   38          Intake/Output Last 24 hours  Intake/Output Summary (Last 24 hours) at 07/02/2018 2219 Last data filed at 07/02/2018 1958 Gross per 24 hour  Intake 3000 ml  Output -  Net 3000 ml    Labs/Imaging Results for orders placed or performed during the hospital encounter of 07/02/18 (from the past 48 hour(s))  CBC     Status: Abnormal   Collection Time: 07/02/18  2:42 PM  Result Value Ref Range   WBC 9.4 4.0 - 10.5 K/uL   RBC 4.81 3.87 - 5.11 MIL/uL   Hemoglobin 10.5 (L) 12.0 - 15.0 g/dL   HCT 21.1 (L) 15.5 - 20.8 %   MCV 73.6 (L) 80.0 - 100.0 fL   MCH 21.8 (L) 26.0 - 34.0 pg   MCHC 29.7 (L) 30.0 - 36.0 g/dL   RDW 02.2 (H) 33.6 - 12.2 %   Platelets 243 150 - 400 K/uL   nRBC 0.0 0.0 - 0.2 %    Comment: Performed at [facility], [address] [place], [place] [address]  Comprehensive metabolic panel     Status: Abnormal   Collection Time: 07/02/18  2:42 PM  Result Value Ref Range   Sodium 134 (L) 135 - 145 mmol/L   Potassium 3.6 3.5 - 5.1 mmol/L    Comment: HEMOLYSIS AT THIS LEVEL MAY AFFECT RESULT   Chloride 98 98 - 111 mmol/L   CO2 22 22 - 32 mmol/L   Glucose, Bld 122 (H) 70 - 99 mg/dL    BUN 27 (H) 6 - 20 mg/dL   Creatinine, Ser 3.00 (H) 0.44 - 1.00 mg/dL   Calcium 9.6 8.9 - 51.1 mg/dL   Total Protein 8.1 6.5 - 8.1 g/dL   Albumin 4.2 3.5 - 5.0 g/dL   AST 29 15 - 41 U/L   ALT 18 0 - 44 U/L   Alkaline Phosphatase 50 38 - 126 U/L   Total Bilirubin 0.5 0.3 - 1.2 mg/dL   GFR calc non Af Amer 26 (L) >60 mL/min   GFR calc Af Amer 31 (L) >60 mL/min   Anion gap 14 5 - 15    Comment: Performed at [facility], [address] [place], [place] [address]  Troponin I - ONCE - STAT     Status: None   Collection Time: 07/02/18  2:42 PM  Result Value Ref Range   Troponin I <0.03 <0.03 ng/mL    Comment: Performed at [facility], [address], [place], [place] [address]  SARS Coronavirus 2 (CEPHEID- Performed in [facility] hospital lab), Hosp Order     Status: None   Collection Time: 07/02/18  2:43 PM  Result Value Ref Range   SARS Coronavirus 2 NEGATIVE NEGATIVE    Comment: (NOTE) If result is NEGATIVE SARS-CoV-2 target nucleic acids are NOT DETECTED. The SARS-CoV-2 RNA is generally detectable in upper and lower  respiratory specimens during the acute phase of infection. The lowest  concentration of SARS-CoV-2 viral copies this assay can detect is 250  copies / mL. A negative result does not preclude SARS-CoV-2 infection  and should not be used as the sole basis for treatment or other  patient management decisions.  A negative result may occur with  improper specimen collection / handling, submission of specimen other  than nasopharyngeal swab, presence of viral mutation(s) within the  areas targeted by this assay, and inadequate number of viral copies  (<250 copies / mL). A negative result must be combined with clinical  observations, patient history, and epidemiological information. If result is POSITIVE SARS-CoV-2 target nucleic acids are DETECTED. The SARS-CoV-2 RNA is generally detectable in upper and lower  respiratory specimens dur ing the acute  phase of infection.  Positive  results are indicative of active infection with SARS-CoV-2.  Clinical  correlation with patient history and other diagnostic information is  necessary to determine patient infection status.  Positive results do  not  rule out bacterial infection or co-infection with other viruses. If result is PRESUMPTIVE POSTIVE SARS-CoV-2 nucleic acids MAY BE PRESENT.   A presumptive positive result was obtained on the submitted specimen  and confirmed on repeat testing.  While 2019 novel coronavirus  (SARS-CoV-2) nucleic acids may be present in the submitted sample  additional confirmatory testing may be necessary for epidemiological  and / or clinical management purposes  to differentiate between  SARS-CoV-2 and other Sarbecovirus currently known to infect humans.  If clinically indicated additional testing with an alternate test  methodology [phone]([id number]) is advised. The SARS-CoV-2 RNA is generally  detectable in upper and lower respiratory sp ecimens during the acute  phase of infection. The expected result is Negative. Fact Sheet for Patients:  BoilerBrush.com.cyhttps://www.fda.gov/media/136312/download Fact Sheet for Healthcare Providers: https://pope.com/https://www.fda.gov/media/136313/download This test is not yet approved or cleared by the [place] FDA and has been authorized for detection and/or diagnosis of SARS-CoV-2 by FDA under an Emergency Use Authorization (EUA).  This EUA will remain in effect (meaning this test can be used) for the duration of the COVID-19 declaration under Section 564(b)(1) of the Act, 21 U.S.C. section 360bbb-3(b)(1), unless the authorization is terminated or revoked sooner. Performed at [facility], [address] [place], [place] [address]   Urinalysis, Complete w Microscopic     Status: Abnormal   Collection Time: 07/02/18  5:33 PM  Result Value Ref Range   Color, Urine STRAW (A) YELLOW   APPearance CLEAR (A) CLEAR   Specific Gravity, Urine 1.003  (L) 1.005 - 1.030   pH 6.0 5.0 - 8.0   Glucose, UA NEGATIVE NEGATIVE mg/dL   Hgb urine dipstick NEGATIVE NEGATIVE   Bilirubin Urine NEGATIVE NEGATIVE   Ketones, ur NEGATIVE NEGATIVE mg/dL   Protein, ur NEGATIVE NEGATIVE mg/dL   Nitrite NEGATIVE NEGATIVE   Leukocytes,Ua NEGATIVE NEGATIVE   RBC / HPF 0-5 0 - 5 RBC/hpf   WBC, UA 0-5 0 - 5 WBC/hpf   Bacteria, UA RARE (A) NONE SEEN   Squamous Epithelial / LPF 0-5 0 - 5    Comment: Performed at [facility], [address] [place], [place] [address]   Dg Chest Portable 1 View  Result Date: 07/02/2018 CLINICAL DATA:  43 year old female with a history of syncope EXAM: PORTABLE CHEST 1 VIEW COMPARISON:  05/25/2018, 03/26/2010 FINDINGS: Cardiomediastinal silhouette unchanged in size and contour. No evidence of central vascular congestion. No pneumothorax or pleural effusion. No confluent airspace disease. Similar appearance of coarsened interstitial markings, felt to be chronic. No displaced fracture. IMPRESSION: Chronic changes without evidence of superimposed acute cardiopulmonary disease Electronically Signed   By: Gilmer MorJaime  Wagner D.O.   On: 07/02/2018 15:08    Pending Labs [employer]ed Labs (From admission, onward)    Start     Ordered   Signed and Armed forces training and education officerHeld  Basic metabolic panel  Tomorrow morning,   R     Signed and Held   Signed and Held  CBC  Tomorrow morning,   R     Signed and Held          Vitals/Pain Today's Vitals   07/02/18 2044 07/02/18 2056 07/02/18 2158 07/02/18 2219  BP: (!) 134/96 122/85 (!) 129/100 112/78  Pulse: 66 83 72 70  Resp: 17 18 16 17   Temp:      TempSrc:      SpO2: 98% 99% 99% 97%  Weight:      Height:      PainSc:    8  Isolation Precautions Droplet and Contact precautions  Medications Medications  sodium chloride 0.9 % bolus 1,000 mL (0 mLs Intravenous Stopped 07/02/18 1700)  sodium chloride 0.9 % bolus 1,000 mL (0 mLs Intravenous Stopped 07/02/18 1700)  sodium chloride 0.9 % bolus 1,000  mL (0 mLs Intravenous Stopped 07/02/18 1958)  meclizine (ANTIVERT) tablet 25 mg (25 mg Oral Given 07/02/18 2055)    Mobility walks with person assist Low fall risk   Focused Assessments    R Recommendations: See Admitting Provider Note  Report given to:   Additional Notes:  Very unsteady on her feet during ambulation; still c/o dizziness and feeling syncopal when standing.

## 2018-07-02 NOTE — ED Notes (Signed)
Noted 2 small blisters with bruising surround to L 2nd and 3rd toes and small (67mm) open area to bottom of L foot. Sites are dry and crusty, no inflammation noted at this time.

## 2018-07-02 NOTE — ED Triage Notes (Signed)
Pt presents to ED via [facility] from home c/o hypotension. EMS report initial SBP 60s, glucose 157. Per EMS pt had similar episode and was admitted to hospital several weeks ago and was told it was due to poor oral intake and doubling her BP meds at home. Pt states she has not been drinking well because "I've been sick with my chest hurting." States she did take meds last night but not today.

## 2018-07-02 NOTE — ED Notes (Signed)
O2 removed, sat remains WNL. Pt now able to sit upright with SBP 119. Pt still reports feeling like she is going to pass out when standing. EDP Derrill Kay informed, MD states he will come and evaluate pt.

## 2018-07-02 NOTE — ED Notes (Signed)
All fluids placed on pressure bags.

## 2018-07-02 NOTE — H&P (Signed)
[facility] [place] [facility]   PATIENT NAME: Terri Berry    MR#:  [id number]  DATE OF BIRTH:  1975/04/22  DATE OF ADMISSION:  07/02/2018  PRIMARY CARE PHYSICIAN: Thomes Dinning, MD   REQUESTING/REFERRING PHYSICIAN: Derrill Kay, MD  CHIEF COMPLAINT:   Chief Complaint  Patient presents with  . Hypotension    HISTORY OF PRESENT ILLNESS:  Terri Berry  is a 43 y.o. female who presents with chief complaint as above.  Patient presents the ED after near syncopal episode.  She states that she has had a headache and intermittent chest discomfort for the past few days.  She arrived to the ED tonight after near syncopal episode in her bathroom.  She was significantly hypotensive.  Her blood pressure corrected relatively quickly with IV fluids.  However, she does have some AKI.  Her cardiac work-up in the ED is largely within normal limits.  Hospitalist were called for admission and further evaluation  PAST MEDICAL HISTORY:   Past Medical History:  Diagnosis Date  . Allergy   . Anxiety   . Depression   . Diabetes (HCC)   . GERD (gastroesophageal reflux disease)   . GERD (gastroesophageal reflux disease)   . Hypertension   . IBS (irritable bowel syndrome)      PAST SURGICAL HISTORY:   Past Surgical History:  Procedure Laterality Date  . ABDOMINAL HYSTERECTOMY    . GALLBLADDER SURGERY       SOCIAL HISTORY:   Social History   Tobacco Use  . Smoking status: Current Every Day Smoker    Packs/day: 0.50    Types: Cigarettes  . Smokeless tobacco: Never Used  Substance Use Topics  . Alcohol use: Not Currently     FAMILY HISTORY:   Family History  Problem Relation Age of Onset  . Diabetes Father   . Heart disease Father   . Kidney disease Father   . COPD Father   . Cancer Paternal Aunt   . Diabetes Paternal Uncle   . Heart disease Paternal Uncle      DRUG ALLERGIES:   Allergies  Allergen Reactions  . Precedex [Dexmedetomidine Hcl  In Nacl] Other (See Comments)    Significant Bradycardia  . Aspirin     Not indicated due to other anticoagulant use.  Marland Kitchen Cefpodoxime     Tolerates augmentin and Keflex   . Clarithromycin   . Doxycycline   . Ferrous Gluconate   . Lactose Intolerance (Gi)   . Levofloxacin   . Rizatriptan   . Sulfa Antibiotics   . Topiramate   . Venlafaxine   . Zofran [Ondansetron Hcl]     MEDICATIONS AT HOME:   Prior to Admission medications   Medication Sig Start Date End Date Taking? Authorizing Provider  atorvastatin (LIPITOR) 40 MG tablet Take 40 mg by mouth daily. 05/07/18 11/03/18 Yes [provider]  busPIRone (BUSPAR) 10 MG tablet Take 10 mg by mouth 3 (three) times daily. 04/20/18 11/03/18 Yes [provider]  cetirizine (ZYRTEC) 10 MG tablet Take 10 mg by mouth daily.   Yes [provider]  clonazePAM (KLONOPIN) 1 MG tablet Take 1 mg by mouth 3 (three) times daily. 05/07/18 08/05/18 Yes [provider]  dicyclomine (BENTYL) 10 MG capsule Take 10 mg by mouth 3 (three) times daily as needed for spasms. 05/11/18 05/11/19 Yes [provider]  fenofibrate 160 MG tablet Take 160 mg by mouth daily. 05/07/18 11/03/18 Yes [provider]  FLUoxetine (PROZAC)  20 MG capsule Take 60 mg by mouth daily. 06/12/18 12/09/18 Yes [provider]  furosemide (LASIX) 20 MG tablet Take 20 mg by mouth daily. 06/12/18  Yes [provider]  hydrocortisone (ANUSOL-HC) 25 MG suppository Place 25 mg rectally 2 (two) times a day. 06/12/18  Yes [provider]  lisinopril (ZESTRIL) 5 MG tablet Take 5 mg by mouth daily. 06/12/18 06/12/19 Yes [provider]  Melatonin 3 MG TABS Take 6 mg by mouth at bedtime.   Yes [provider]  midodrine (PROAMATINE) 10 MG tablet Take 1 tablet (10 mg total) by mouth 3 (three) times daily with meals. 05/28/18  Yes Auburn Bilberry, MD  mirtazapine (REMERON) 30 MG tablet Take 30 mg by mouth at bedtime. 05/19/18  Yes  [provider]  omeprazole (PRILOSEC) 20 MG capsule Take 40 mg by mouth 2 (two) times daily. 05/07/18  Yes [provider]  Pancrelipase, Lip-Prot-Amyl, 24000-76000 units CPEP Take 2 capsules by mouth 3 (three) times daily with meals.   Yes [provider]  pregabalin (LYRICA) 200 MG capsule Take 200 mg by mouth 3 (three) times daily. 05/07/18  Yes [provider]  prochlorperazine (COMPAZINE) 10 MG tablet Take 10 mg by mouth daily. 04/20/18  Yes [provider]  promethazine (PHENERGAN) 25 MG tablet Take 25 mg by mouth at bedtime as needed for nausea. 04/22/18  Yes [provider]  senna (SENOKOT) 8.6 MG TABS tablet Take 4 tablets by mouth daily.   Yes [provider]  tiZANidine (ZANAFLEX) 4 MG tablet Take 4 mg by mouth 2 (two) times daily as needed for muscle spasms. 05/22/18 05/22/19 Yes [provider]  Vitamin D, Ergocalciferol, (DRISDOL) 1.25 MG (50000 UT) CAPS capsule Take 50,000 Units by mouth once a week. 04/20/18 07/24/18 Yes [provider]  acetaminophen (TYLENOL) 325 MG tablet Take 2 tablets (650 mg total) by mouth every 6 (six) hours as needed for mild pain (or Fever >/= 101). Patient not taking: Reported on 05/26/2018 03/27/18   Mayo, Allyn Kenner, MD  albuterol (PROVENTIL HFA;VENTOLIN HFA) 108 (90 Base) MCG/ACT inhaler Inhale 2 puffs into the lungs every 6 (six) hours as needed for wheezing or shortness of breath. 02/15/18   Shaune Pollack, MD  apixaban (ELIQUIS) 5 MG TABS tablet Take 5 mg by mouth every 12 (twelve) hours. 04/20/18 06/19/18  [provider]    REVIEW OF SYSTEMS:  Review of Systems  Constitutional: Negative for chills, fever, malaise/fatigue and weight loss.  HENT: Negative for ear pain, hearing loss and tinnitus.   Eyes: Negative for blurred vision, double vision, pain and redness.  Respiratory: Negative for cough, hemoptysis and shortness of breath.   Cardiovascular: Positive for chest pain.  Negative for palpitations, orthopnea and leg swelling.       Near syncope  Gastrointestinal: Negative for abdominal pain, constipation, diarrhea, nausea and vomiting.  Genitourinary: Negative for dysuria, frequency and hematuria.  Musculoskeletal: Negative for back pain, joint pain and neck pain.  Skin:       No acne, rash, or lesions  Neurological: Positive for headaches. Negative for dizziness, tremors, focal weakness and weakness.  Endo/Heme/Allergies: Negative for polydipsia. Does not bruise/bleed easily.  Psychiatric/Behavioral: Negative for depression. The patient is not nervous/anxious and does not have insomnia.      VITAL SIGNS:   Vitals:   07/02/18 1934 07/02/18 1958 07/02/18 2044 07/02/18 2056  BP: 127/88 124/86 (!) 134/96 122/85  Pulse: (!) 59 67 66 83  Resp: 17  17 17 18   Temp:      TempSrc:      SpO2: 100% 100% 98% 99%  Weight:      Height:       Wt Readings from Last 3 Encounters:  07/02/18 90.7 kg  05/30/18 91 kg  05/25/18 90.7 kg    PHYSICAL EXAMINATION:  Physical Exam  Vitals reviewed. Constitutional: She is oriented to person, place, and time. She appears well-developed and well-nourished. No distress.  HENT:  Head: Normocephalic and atraumatic.  Mouth/Throat: Oropharynx is clear and moist.  Eyes: Pupils are equal, round, and reactive to light. Conjunctivae and EOM are normal. No scleral icterus.  Neck: Normal range of motion. Neck supple. No JVD present. No thyromegaly present.  Cardiovascular: Normal rate, regular rhythm and intact distal pulses. Exam reveals no gallop and no friction rub.  No murmur heard. Respiratory: Effort normal and breath sounds normal. No respiratory distress. She has no wheezes. She has no rales.  GI: Soft. Bowel sounds are normal. She exhibits no distension. There is no abdominal tenderness.  Musculoskeletal: Normal range of motion.        General: No edema.     Comments: No arthritis, no gout  Lymphadenopathy:    She has  no cervical adenopathy.  Neurological: She is alert and oriented to person, place, and time. No cranial nerve deficit.  No dysarthria, no aphasia  Skin: Skin is warm and dry. No rash noted. No erythema.  Psychiatric: She has a normal mood and affect. Her behavior is normal. Judgment and thought content normal.    LABORATORY PANEL:   CBC Recent Labs  Lab 07/02/18 1442  WBC 9.4  HGB 10.5*  HCT 35.4*  PLT 243   ------------------------------------------------------------------------------------------------------------------  Chemistries  Recent Labs  Lab 07/02/18 1442  NA 134*  K 3.6  CL 98  CO2 22  GLUCOSE 122*  BUN 27*  CREATININE 2.20*  CALCIUM 9.6  AST 29  ALT 18  ALKPHOS 50  BILITOT 0.5   ------------------------------------------------------------------------------------------------------------------  Cardiac Enzymes Recent Labs  Lab 07/02/18 1442  TROPONINI <0.03   ------------------------------------------------------------------------------------------------------------------  RADIOLOGY:  Dg Chest Portable 1 View  Result Date: 07/02/2018 CLINICAL DATA:  43 year old female with a history of syncope EXAM: PORTABLE CHEST 1 VIEW COMPARISON:  05/25/2018, 03/26/2010 FINDINGS: Cardiomediastinal silhouette unchanged in size and contour. No evidence of central vascular congestion. No pneumothorax or pleural effusion. No confluent airspace disease. Similar appearance of coarsened interstitial markings, felt to be chronic. No displaced fracture. IMPRESSION: Chronic changes without evidence of superimposed acute cardiopulmonary disease Electronically Signed   By: Gilmer MorJaime  Wagner D.O.   On: 07/02/2018 15:08    EKG:   Orders placed or performed during the hospital encounter of 07/02/18  . EKG 12-Lead  . EKG 12-Lead    IMPRESSION AND PLAN:  Principal Problem:   Hypotension -unclear absolute etiology.  Patient does state that her p.o. intake has been poor these last  few days.  She has furosemide listed on her home meds, but states that she has been taken off of this.  She also has Midrin listed on home meds, but states that this was also stopped.  We will hydrate her with IV fluids tonight Active Problems:   AKI (acute kidney injury) (HCC) -IV fluids as above, avoid nephrotoxins and monitor   Diabetes (HCC) -sliding scale insulin coverage   GERD (gastroesophageal reflux disease) -home dose PPI   Anxiety -home dose anxiolytics  Chart review performed and case discussed with ED  provider. Labs, imaging and/or ECG reviewed by provider and discussed with patient/family. Management plans discussed with the patient and/or family.  COVID-19 status: Tested negative     DVT PROPHYLAXIS: Systemic anticoagulation  GI PROPHYLAXIS:  PPI   ADMISSION STATUS: Inpatient     CODE STATUS: Full Code Status History    Date Active Date Inactive Code Status Order ID Comments User Context   05/26/2018 0211 05/28/2018 1954 Full Code [id number]  Oralia Manis, MD Inpatient   03/18/2018 1409 03/27/2018 2130 Full Code [id number]  Milagros Loll, MD ED   02/09/2018 0921 02/15/2018 1549 Full Code [id number]  Barbaraann Rondo, MD Inpatient   11/28/2017 0352 12/04/2017 2009 Full Code [id number]  Barbaraann Rondo, MD Inpatient      TOTAL TIME TAKING CARE OF THIS PATIENT: 45 minutes.   This patient was evaluated in the context of the global COVID-19 pandemic, which necessitated consideration that the patient might be at risk for infection with the SARS-CoV-2 virus that causes COVID-19. Institutional protocols and algorithms that pertain to the evaluation of patients at risk for COVID-19 are in a state of rapid change based on information released by regulatory bodies including the CDC and federal and state organizations. These policies and algorithms were followed to the best of this provider's knowledge to date during the patient's care at this facility.  Barney Drain 07/02/2018,  9:42 PM  [facility]  Office  [phone]  CC: Primary care physician; Thomes Dinning, MD  Note:  This document was prepared using Dragon voice recognition software and may include unintentional dictation errors.

## 2018-07-02 NOTE — ED Notes (Addendum)
Pt placed on 2L O2 for O2 sat 88% while in trendelenburg. When encouraged to take deep breaths, O2 sat returns to normal while in trendelenburg.

## 2018-07-02 NOTE — ED Provider Notes (Signed)
[facility] Emergency Department Provider Note  Time seen: 2:41 PM  I have reviewed the triage vital signs and the nursing notes.   HISTORY  Chief Complaint Hypotension Near syncope   HPI Terri Berry is a 43 y.o. female with a past medical history anxiety, depression, gastric reflux, diabetes, presents to the emergency department after near syncopal episode at home found to be hypotensive.  According to the patient approximate 2 hours ago she began feeling very weak with a near syncopal event.  EMS was called patient found to have hypotension around 70 systolic and was brought to the emergency department.  Here the patient is awake alert and oriented, she states a similar event occurred approximately 1 week ago when she had low blood pressure as well but they did not find out why per patient.  Patient denies taking any medications today including her blood pressure medications.  Denies any drugs or alcohol.  Patient states she has been feeling somewhat weak and fatigued over the past several days with mild central chest discomfort as well as a mild cough.  Denies any fever.  Denies any known sick contacts.  No abdominal pain, vomiting or diarrhea.  No dysuria.   Past Medical History:  Diagnosis Date  . Allergy   . Anxiety   . Depression   . Diabetes (HCC)   . GERD (gastroesophageal reflux disease)   . GERD (gastroesophageal reflux disease)   . Hypertension   . IBS (irritable bowel syndrome)     Patient Active Problem List   Diagnosis Date Noted  . Hypotension 05/26/2018  . Subclavian arterial stenosis (HCC) 05/26/2018  . Diabetes (HCC) 05/26/2018  . GERD (gastroesophageal reflux disease) 05/25/2018  . HTN (hypertension) 05/25/2018  . Anxiety 05/25/2018  . AKI (acute kidney injury) (HCC) 05/25/2018  . Distended abdomen   . Acute pancreatitis 03/18/2018  . Acute hypoxemic respiratory failure (HCC) 02/09/2018  . Overdose 11/28/2017    Past Surgical  History:  Procedure Laterality Date  . ABDOMINAL HYSTERECTOMY    . GALLBLADDER SURGERY      Prior to Admission medications   Medication Sig Start Date End Date Taking? Authorizing Provider  acetaminophen (TYLENOL) 325 MG tablet Take 2 tablets (650 mg total) by mouth every 6 (six) hours as needed for mild pain (or Fever >/= 101). Patient not taking: Reported on 05/26/2018 03/27/18   Mayo, Allyn Kenner, MD  albuterol (PROVENTIL HFA;VENTOLIN HFA) 108 (90 Base) MCG/ACT inhaler Inhale 2 puffs into the lungs every 6 (six) hours as needed for wheezing or shortness of breath. 02/15/18   Shaune Pollack, MD  apixaban (ELIQUIS) 5 MG TABS tablet Take 5 mg by mouth every 12 (twelve) hours. 04/20/18 06/19/18  [provider]  atorvastatin (LIPITOR) 40 MG tablet Take 40 mg by mouth daily. 05/07/18 11/03/18  [provider]  busPIRone (BUSPAR) 10 MG tablet Take 10 mg by mouth 3 (three) times daily. 04/20/18 11/03/18  [provider]  clonazePAM (KLONOPIN) 1 MG tablet Take 1 mg by mouth 3 (three) times daily. 05/07/18 08/05/18  [provider]  dicyclomine (BENTYL) 10 MG capsule Take 10 mg by mouth 3 (three) times daily as needed for spasms. 05/11/18 05/11/19  [provider]  fenofibrate 160 MG tablet Take 160 mg by mouth daily. 05/07/18 11/03/18  [provider]  FLUoxetine (PROZAC) 20 MG capsule Take 40 mg by mouth daily.  05/07/18 11/03/18  [provider]  Melatonin 3 MG TABS Take 6 mg by mouth  at bedtime.    [provider]  midodrine (PROAMATINE) 10 MG tablet Take 1 tablet (10 mg total) by mouth 3 (three) times daily with meals. 05/28/18   Auburn Bilberry, MD  mirtazapine (REMERON) 30 MG tablet Take 30 mg by mouth at bedtime. 05/19/18   [provider]  omeprazole (PRILOSEC) 20 MG capsule Take 40 mg by mouth 2 (two) times daily. 05/07/18   [provider]  Pancrelipase, Lip-Prot-Amyl, 24000-76000 units CPEP Take 2 capsules by mouth 3 (three) times  daily with meals.    [provider]  pregabalin (LYRICA) 200 MG capsule Take 200 mg by mouth 3 (three) times daily. 05/07/18   [provider]  prochlorperazine (COMPAZINE) 10 MG tablet Take 10 mg by mouth daily. 04/20/18   [provider]  promethazine (PHENERGAN) 25 MG tablet Take 25 mg by mouth at bedtime as needed for nausea. 04/22/18   [provider]  senna (SENOKOT) 8.6 MG TABS tablet Take 4 tablets by mouth daily.    [provider]  tiZANidine (ZANAFLEX) 4 MG tablet Take 4 mg by mouth 2 (two) times daily as needed for muscle spasms. 05/22/18 05/22/19  [provider]  Vitamin D, Ergocalciferol, (DRISDOL) 1.25 MG (50000 UT) CAPS capsule Take 50,000 Units by mouth once a week. 04/20/18 07/24/18  [provider]    Allergies  Allergen Reactions  . Precedex [Dexmedetomidine Hcl In Nacl] Other (See Comments)    Significant Bradycardia  . Cefpodoxime     Tolerates augmentin and Keflex   . Clarithromycin   . Doxycycline   . Ferrous Gluconate   . Lactose Intolerance (Gi)   . Levofloxacin   . Rizatriptan   . Sulfa Antibiotics   . Topiramate   . Venlafaxine   . Zofran [Ondansetron Hcl]     Family History  Problem Relation Age of Onset  . Diabetes Father   . Heart disease Father   . Kidney disease Father   . COPD Father   . Cancer Paternal Aunt   . Diabetes Paternal Uncle   . Heart disease Paternal Uncle     Social History Social History   Tobacco Use  . Smoking status: Current Every Day Smoker    Packs/day: 0.50    Types: Cigarettes  . Smokeless tobacco: Never Used  Substance Use Topics  . Alcohol use: Not Currently  . Drug use: Not Currently    Review of Systems Constitutional: Negative for fever. Cardiovascular: Mild intermittent chest pain Respiratory: Negative for shortness of breath.  Negative for cough. Gastrointestinal: Negative for abdominal pain, vomiting  Genitourinary: Negative for urinary  compaints Musculoskeletal: Negative for musculoskeletal complaints Skin: Negative for skin complaints  Neurological: Negative for headache All other ROS negative  ____________________________________________   PHYSICAL EXAM:  VITAL SIGNS: ED Triage Vitals  Enc Vitals Group     BP 07/02/18 1415 (S) (!) 69/53     Pulse Rate 07/02/18 1415 70     Resp 07/02/18 1415 16     Temp 07/02/18 1415 98.7 F (37.1 C)     Temp Source 07/02/18 1415 Oral     SpO2 07/02/18 1415 98 %     Weight 07/02/18 1432 200 lb (90.7 kg)     Height 07/02/18 1432  (1.702 m)     Head Circumference --      Peak Flow --      Pain Score 07/02/18 1432 7     Pain Loc --  Pain Edu? --      Excl. in GC? --    Constitutional: Alert and oriented. Well appearing and in no distress. Eyes: Normal exam ENT      Head: Normocephalic and atraumatic.      Mouth/Throat: Mucous membranes are moist. Cardiovascular: Normal rate, regular rhythm. Respiratory: Normal respiratory effort without tachypnea nor retractions. Breath sounds are clear  Gastrointestinal: Soft and nontender. No distention.  Musculoskeletal: Nontender with normal range of motion in all extremities.  Neurologic:  Normal speech and language. No gross focal neurologic deficits Skin:  Skin is warm, dry and intact.  Psychiatric: Mood and affect are normal.   ____________________________________________    EKG  EKG viewed and interpreted by myself shows a sinus rhythm at 66 bpm with a narrow QRS, normal axis, normal intervals, no concerning ST changes.  ____________________________________________    RADIOLOGY  Chest x-ray pending  ____________________________________________   INITIAL IMPRESSION / ASSESSMENT AND PLAN / ED COURSE  Pertinent labs & imaging results that were available during my care of the patient were reviewed by me and considered in my medical decision making (see chart for details).   Patient presents to the  emergency department found to be hypotensive 69/53 after near syncopal episode and weakness for several days.  Patient is only complaint is mild intermittent chest discomfort as well as cough.  Patient is quite hypertensive 69/53 awake alert oriented, placed in Trendelenburg, two 1 L boluses on pressure bags infusing.  We will check labs including cardiac enzymes.  EKG shows nonspecific findings.  Labs are pending.  Patient care will be signed out to oncoming physician.  Maxx Andria MeuseStevens was evaluated in Emergency Department on 07/02/2018 for the symptoms described in the history of present illness. She was evaluated in the context of the global COVID-19 pandemic, which necessitated consideration that the patient might be at risk for infection with the SARS-CoV-2 virus that causes COVID-19. Institutional protocols and algorithms that pertain to the evaluation of patients at risk for COVID-19 are in a state of rapid change based on information released by regulatory bodies including the CDC and federal and state organizations. These policies and algorithms were followed during the patient's care in the ED.  ____________________________________________   FINAL CLINICAL IMPRESSION(S) / ED DIAGNOSES  Hypotension Near syncope   Minna AntisPaduchowski, Mahati Vajda, MD 07/02/18 [phone]

## 2018-07-02 NOTE — ED Notes (Signed)
Terri Berry (husband) contact # [phone]

## 2018-07-03 DIAGNOSIS — N179 Acute kidney failure, unspecified: Secondary | ICD-10-CM | POA: Diagnosis present

## 2018-07-03 LAB — CBC
HCT: 30.8 % — ABNORMAL LOW (ref 36.0–46.0)
Hemoglobin: 9.2 g/dL — ABNORMAL LOW (ref 12.0–15.0)
MCH: 22 pg — ABNORMAL LOW (ref 26.0–34.0)
MCHC: 29.9 g/dL — ABNORMAL LOW (ref 30.0–36.0)
MCV: 73.7 fL — ABNORMAL LOW (ref 80.0–100.0)
Platelets: 353 10*3/uL (ref 150–400)
RBC: 4.18 MIL/uL (ref 3.87–5.11)
RDW: 16.9 % — ABNORMAL HIGH (ref 11.5–15.5)
WBC: 8.4 10*3/uL (ref 4.0–10.5)
nRBC: 0 % (ref 0.0–0.2)

## 2018-07-03 LAB — BASIC METABOLIC PANEL
Anion gap: 10 (ref 5–15)
BUN: 22 mg/dL — ABNORMAL HIGH (ref 6–20)
CO2: 23 mmol/L (ref 22–32)
Calcium: 9.1 mg/dL (ref 8.9–10.3)
Chloride: 107 mmol/L (ref 98–111)
Creatinine, Ser: 1.22 mg/dL — ABNORMAL HIGH (ref 0.44–1.00)
GFR calc Af Amer: >60 mL/min (ref >60)
GFR calc non Af Amer: 54 mL/min — ABNORMAL LOW (ref >60)
Glucose, Bld: 141 mg/dL — ABNORMAL HIGH (ref 70–99)
Potassium: 3.3 mmol/L — ABNORMAL LOW (ref 3.5–5.1)
Sodium: 140 mmol/L (ref 135–145)

## 2018-07-03 LAB — GLUCOSE, CAPILLARY
Glucose-Capillary: 120 mg/dL — ABNORMAL HIGH (ref 70–99)
Glucose-Capillary: 159 mg/dL — ABNORMAL HIGH (ref 70–99)

## 2018-07-03 NOTE — Care Management (Signed)
Patient active with [facility] home health. Patient should not require new orders as she is observation however MD did place orders in case that is needed. Per patient she believes everything is in place for resumption of home health and she has all needed contact info just in case. 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

## 2018-07-03 NOTE — Progress Notes (Signed)
Terri Berry to be D/C'd home per MD order.  Discussed prescriptions and follow up appointments with the patient. Prescriptions given to patient, medication list explained in detail. Pt verbalized understanding.  Allergies as of 07/03/2018      Reactions   Precedex [dexmedetomidine Hcl In Nacl] Other (See Comments)   Significant Bradycardia   Aspirin    Not indicated due to other anticoagulant use.   Cefpodoxime    Tolerates augmentin and Keflex    Clarithromycin    Doxycycline    Ferrous Gluconate    Lactose Intolerance (gi)    Levofloxacin    Rizatriptan    Sulfa Antibiotics    Topiramate    Venlafaxine    Zofran [ondansetron Hcl]       Medication List    STOP taking these medications   acetaminophen 325 MG tablet Commonly known as:  TYLENOL   furosemide 20 MG tablet Commonly known as:  LASIX   lisinopril 5 MG tablet Commonly known as:  ZESTRIL   midodrine 10 MG tablet Commonly known as:  PROAMATINE     TAKE these medications   albuterol 108 (90 Base) MCG/ACT inhaler Commonly known as:  VENTOLIN HFA Inhale 2 puffs into the lungs every 6 (six) hours as needed for wheezing or shortness of breath.   apixaban 5 MG Tabs tablet Commonly known as:  ELIQUIS Take 5 mg by mouth every 12 (twelve) hours.   atorvastatin 40 MG tablet Commonly known as:  LIPITOR Take 40 mg by mouth daily.   busPIRone 10 MG tablet Commonly known as:  BUSPAR Take 10 mg by mouth 3 (three) times daily.   cetirizine 10 MG tablet Commonly known as:  ZYRTEC Take 10 mg by mouth daily.   clonazePAM 1 MG tablet Commonly known as:  KLONOPIN Take 1 mg by mouth 3 (three) times daily.   dicyclomine 10 MG capsule Commonly known as:  BENTYL Take 10 mg by mouth 3 (three) times daily as needed for spasms.   fenofibrate 160 MG tablet Take 160 mg by mouth daily.   FLUoxetine 20 MG capsule Commonly known as:  PROZAC Take 60 mg by mouth daily.   hydrocortisone 25 MG suppository Commonly known  as:  ANUSOL-HC Place 25 mg rectally 2 (two) times a day.   Melatonin 3 MG Tabs Take 6 mg by mouth at bedtime.   mirtazapine 30 MG tablet Commonly known as:  REMERON Take 30 mg by mouth at bedtime.   omeprazole 20 MG capsule Commonly known as:  PRILOSEC Take 40 mg by mouth 2 (two) times daily.   Pancrelipase (Lip-Prot-Amyl) 24000-76000 units Cpep Take 2 capsules by mouth 3 (three) times daily with meals.   pregabalin 200 MG capsule Commonly known as:  LYRICA Take 200 mg by mouth 3 (three) times daily.   prochlorperazine 10 MG tablet Commonly known as:  COMPAZINE Take 10 mg by mouth daily.   promethazine 25 MG tablet Commonly known as:  PHENERGAN Take 25 mg by mouth at bedtime as needed for nausea.   senna 8.6 MG Tabs tablet Commonly known as:  SENOKOT Take 4 tablets by mouth daily.   tiZANidine 4 MG tablet Commonly known as:  ZANAFLEX Take 4 mg by mouth 2 (two) times daily as needed for muscle spasms.   Vitamin D (Ergocalciferol) 1.25 MG (50000 UT) Caps capsule Commonly known as:  DRISDOL Take 50,000 Units by mouth once a week.       Vitals:   07/03/18 0542 07/03/18 1219  BP: 91/60 106/84  Pulse: (!) 57 60  Resp: 20 16  Temp: 97.9 F (36.6 C) (!) 97.5 F (36.4 C)  SpO2: 100% 95%    Skin clean, dry and intact without evidence of skin break down, no evidence of skin tears noted. IV catheter discontinued intact. Site without signs and symptoms of complications. Dressing and pressure applied. Pt denies pain at this time. No complaints noted.  An After Visit Summary was printed and given to the patient. [facility] and [facility] [place] [employer]  Cecil CobbsMolly Weismiller RN [facility] Phone [phone]

## 2018-07-03 NOTE — Discharge Instructions (Signed)
Patient advised to hydrate herself at home adequately. I have asked her to hold her blood pressure medication lisinopril and diuretic Lasix for next few days. Keep log of blood pressure at home

## 2018-07-03 NOTE — Progress Notes (Signed)
Patient ID: Terri Berry, female   DOB: 04-Mar-1975, 43 y.o.   MRN: [id number] Spoke with care manager to inform [facility] that pt may go home later today

## 2018-07-03 NOTE — Discharge Summary (Signed)
[facility] [place] [facility]   PATIENT NAME: Terri Berry    MR#:  [id number]  DATE OF BIRTH:  [birth year]  DATE OF ADMISSION:  07/02/2018 ADMITTING PHYSICIAN: Oralia Manisavid Willis, MD  DATE OF DISCHARGE: 07/03/2018  PRIMARY CARE PHYSICIAN: Thomes DinningWeeks, Cynthia, MD    ADMISSION DIAGNOSIS:  Dehydration [E86.0] Dizziness [R42] AKI (acute kidney injury) (HCC) [N17.9]  DISCHARGE DIAGNOSIS:  near syncope suspected due to dehydration acute renal failure due to poor PO intake/prerenal azotemia  SECONDARY DIAGNOSIS:   Past Medical History:  Diagnosis Date  . Allergy   . Anxiety   . Depression   . GERD (gastroesophageal reflux disease)   . GERD (gastroesophageal reflux disease)   . Hypertension   . IBS (irritable bowel syndrome)     HOSPITAL COURSE:  Terri Berry  is a 43 y.o. female who presents with chief complaint as above.  Patient presents the ED after near syncopal episode.  She states that she has had a headache and intermittent chest discomfort for the past few days  *Hypotension /near syncope suspected due to dehydration -  Patient does state that her p.o. intake has been poor these last few days.   -patient came in with creatinine of 2.22. She received IV fluids. Creatinine Dr. 1.2. Blood pressure much improved. Patient able to tolerate PO diet. -I have asked her not to take her Lasix. Holding lisinopril. -He was here with similar symptoms in April. Importance of hydration discussed with patient. -She remains in sinus rhythm.  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  Will ambulate patient. She will discharged to home later if continues to improve. She will follow up with primary care physician as outpatient will resume home health services as before  CONSULTS OBTAINED:    DRUG ALLERGIES:   Allergies  Allergen Reactions  . Precedex [Dexmedetomidine Hcl In Nacl] Other (See Comments)    Significant Bradycardia  . Aspirin     Not indicated due to other anticoagulant use.  Marland Kitchen. Cefpodoxime     Tolerates augmentin and Keflex   . Clarithromycin   . Doxycycline   . Ferrous Gluconate   . Lactose Intolerance (Gi)   . Levofloxacin   . Rizatriptan   . Sulfa Antibiotics   . Topiramate   . Venlafaxine   . Zofran [Ondansetron Hcl]     DISCHARGE MEDICATIONS:   Allergies as of 07/03/2018      Reactions   Precedex [dexmedetomidine Hcl In Nacl] Other (See Comments)   Significant Bradycardia   Aspirin    Not indicated due to other anticoagulant use.   Cefpodoxime    Tolerates augmentin and Keflex    Clarithromycin    Doxycycline    Ferrous Gluconate    Lactose Intolerance (gi)    Levofloxacin    Rizatriptan    Sulfa Antibiotics    Topiramate    Venlafaxine  Zofran [ondansetron Hcl]       Medication List    STOP taking these medications   acetaminophen 325 MG tablet Commonly known as:  TYLENOL   furosemide 20 MG tablet Commonly known as:  LASIX   lisinopril 5 MG tablet Commonly known as:  ZESTRIL   midodrine 10 MG tablet Commonly known as:  PROAMATINE     TAKE these medications   albuterol 108 (90 Base) MCG/ACT inhaler Commonly known as:  VENTOLIN HFA Inhale 2 puffs into the lungs every 6 (six) hours as needed for wheezing or shortness of breath.   apixaban 5 MG Tabs  tablet Commonly known as:  ELIQUIS Take 5 mg by mouth every 12 (twelve) hours.   atorvastatin 40 MG tablet Commonly known as:  LIPITOR Take 40 mg by mouth daily.   busPIRone 10 MG tablet Commonly known as:  BUSPAR Take 10 mg by mouth 3 (three) times daily.   cetirizine 10 MG tablet Commonly known as:  ZYRTEC Take 10 mg by mouth daily.   clonazePAM 1 MG tablet Commonly known as:  KLONOPIN Take 1 mg by mouth 3 (three) times daily.   dicyclomine 10 MG capsule Commonly known as:  BENTYL Take 10 mg by mouth 3 (three) times daily as needed for spasms.   fenofibrate 160 MG tablet Take 160 mg by mouth daily.   FLUoxetine 20 MG capsule Commonly known as:  PROZAC Take 60 mg by mouth daily.   hydrocortisone 25 MG suppository Commonly known as:  ANUSOL-HC Place 25 mg rectally 2 (two) times a day.   Melatonin 3 MG Tabs Take 6 mg by mouth at bedtime.   mirtazapine 30 MG tablet Commonly known as:  REMERON Take 30 mg by mouth at bedtime.   omeprazole 20 MG capsule Commonly known as:  PRILOSEC Take 40 mg by mouth 2 (two) times daily.   Pancrelipase (Lip-Prot-Amyl) 24000-76000 units Cpep Take 2 capsules by mouth 3 (three) times daily with meals.   pregabalin 200 MG capsule Commonly known as:  LYRICA Take 200 mg by mouth 3 (three) times daily.   prochlorperazine 10 MG tablet Commonly known as:  COMPAZINE Take 10 mg by mouth daily.   promethazine 25 MG tablet Commonly known as:  PHENERGAN Take 25 mg by mouth at bedtime as needed for nausea.   senna 8.6 MG Tabs tablet Commonly known as:  SENOKOT Take 4 tablets by mouth daily.   tiZANidine 4 MG tablet Commonly known as:  ZANAFLEX Take 4 mg by mouth 2 (two) times daily as needed for muscle spasms.   Vitamin D (Ergocalciferol) 1.25 MG (50000 UT) Caps capsule Commonly known as:  DRISDOL Take 50,000 Units by mouth once a week.       If you experience worsening of your admission symptoms, develop shortness of breath,  life threatening emergency, suicidal or homicidal thoughts you must seek medical attention immediately by calling 911 or calling your MD immediately  if symptoms less severe.  You Must read complete instructions/literature along with all the possible adverse reactions/side effects for all the Medicines you take and that have been prescribed to you. Take any new Medicines after you have completely understood and accept all the possible adverse reactions/side effects.   Please note  You were cared for by a hospitalist during your hospital stay. If you have any questions about your discharge medications or the care you received while you were in the hospital after you are discharged, you can call the unit and  asked to speak with the hospitalist on call if the hospitalist that took care of you is not available. Once you are discharged, your primary care physician will handle any further medical issues. Please note that NO REFILLS for any discharge medications will be authorized once you are discharged, as it is imperative that you return to your primary care physician (or establish a relationship with a primary care physician if you do not have one) for your aftercare needs so that they can reassess your need for medications and monitor your lab values. Today   SUBJECTIVE   Feels a lot better today. Drinking water. Had some juice in the morning.  VITAL SIGNS:  Blood pressure 106/84, pulse 60, temperature (!) 97.5 F (36.4 C), temperature source Oral, resp. rate 16, height 5\' 7"  (1.702 m), weight 90.7 kg, SpO2 95 %.  I/O:    Intake/Output Summary (Last 24 hours) at 07/03/2018 1439 Last data filed at 07/03/2018 0901 Gross per 24 hour  Intake 3240 ml  Output 400 ml  Net 2840 ml    PHYSICAL EXAMINATION:  GENERAL:  43 y.o.-year-old patient lying in the bed with no acute distress.  EYES: Pupils equal, round, reactive to light and accommodation. No scleral icterus. Extraocular muscles intact.   HEENT: Head atraumatic, normocephalic. Oropharynx and nasopharynx clear.  NECK:  Supple, no jugular venous distention. No thyroid enlargement, no tenderness.  LUNGS: Normal breath sounds bilaterally, no wheezing, rales,rhonchi or crepitation. No use of accessory muscles of respiration.  CARDIOVASCULAR: S1, S2 normal. No murmurs, rubs, or gallops.  ABDOMEN: Soft, non-tender, non-distended. Bowel sounds present. No organomegaly or mass.  EXTREMITIES: No pedal edema, cyanosis, or clubbing.  NEUROLOGIC: Cranial nerves II through XII are intact. Muscle strength 5/5 in all extremities. Sensation intact. Gait not checked.  PSYCHIATRIC: The patient is alert and oriented x 3.  SKIN some old diabetic ulcer in the feet appears healed. Patient has calluses and corn in her feet. No open or draining ulcer. Dry skin.  DATA REVIEW:   CBC  Recent Labs  Lab 07/03/18 0428  WBC 8.4  HGB 9.2*  HCT 30.8*  PLT 353    Chemistries  Recent Labs  Lab 07/02/18 1442 07/03/18 0428  NA 134* 140  K 3.6 3.3*  CL 98 107  CO2 22 23  GLUCOSE 122* 141*  BUN 27* 22*  CREATININE 2.20* 1.22*  CALCIUM 9.6 9.1  AST 29  --   ALT 18  --   ALKPHOS 50  --   BILITOT 0.5  --     Microbiology Results   Recent Results (from the past 240 hour(s))  SARS Coronavirus 2 (CEPHEID- Performed in [facility] hospital lab), Hosp Order     Status: None   Collection Time: 07/02/18  2:43 PM  Result Value Ref Range Status   SARS Coronavirus 2 NEGATIVE NEGATIVE Final    Comment: (NOTE) If result is NEGATIVE SARS-CoV-2 target nucleic acids are NOT DETECTED. The SARS-CoV-2 RNA is generally detectable in upper and lower  respiratory specimens during the acute phase of infection. The lowest  concentration of SARS-CoV-2 viral copies this assay can detect is 250  copies / mL. A negative result does not preclude SARS-CoV-2 infection  and should not be used as the sole basis for treatment or other  patient management decisions.  A  negative result may occur with  improper specimen collection / handling, submission of specimen other  than nasopharyngeal swab, presence of viral mutation(s) within the  areas  targeted by this assay, and inadequate number of viral copies  (<250 copies / mL). A negative result must be combined with clinical  observations, patient history, and epidemiological information. If result is POSITIVE SARS-CoV-2 target nucleic acids are DETECTED. The SARS-CoV-2 RNA is generally detectable in upper and lower  respiratory specimens dur ing the acute phase of infection.  Positive  results are indicative of active infection with SARS-CoV-2.  Clinical  correlation with patient history and other diagnostic information is  necessary to determine patient infection status.  Positive results do  not rule out bacterial infection or co-infection with other viruses. If result is PRESUMPTIVE POSTIVE SARS-CoV-2 nucleic acids MAY BE PRESENT.   A presumptive positive result was obtained on the submitted specimen  and confirmed on repeat testing.  While 2019 novel coronavirus  (SARS-CoV-2) nucleic acids may be present in the submitted sample  additional confirmatory testing may be necessary for epidemiological  and / or clinical management purposes  to differentiate between  SARS-CoV-2 and other Sarbecovirus currently known to infect humans.  If clinically indicated additional testing with an alternate test  methodology [phone]) is advised. The SARS-CoV-2 RNA is generally  detectable in upper and lower respiratory sp ecimens during the acute  phase of infection. The expected result is Negative. Fact Sheet for Patients:  BoilerBrush.com.cy Fact Sheet for Healthcare Providers: https://pope.com/ This test is not yet approved or cleared by the [place] FDA and has been authorized for detection and/or diagnosis of SARS-CoV-2 by FDA under an Emergency Use  Authorization (EUA).  This EUA will remain in effect (meaning this test can be used) for the duration of the COVID-19 declaration under Section 564(b)(1) of the Act, 21 U.S.C. section 360bbb-3(b)(1), unless the authorization is terminated or revoked sooner. Performed at [facility], [address] [place] [place], [place] [address]     RADIOLOGY:  Dg Chest Portable 1 View  Result Date: 07/02/2018 CLINICAL DATA:  43 year old female with a history of syncope EXAM: PORTABLE CHEST 1 VIEW COMPARISON:  05/25/2018, 03/26/2010 FINDINGS: Cardiomediastinal silhouette unchanged in size and contour. No evidence of central vascular congestion. No pneumothorax or pleural effusion. No confluent airspace disease. Similar appearance of coarsened interstitial markings, felt to be chronic. No displaced fracture. IMPRESSION: Chronic changes without evidence of superimposed acute cardiopulmonary disease Electronically Signed   By: Gilmer Mor D.O.   On: 07/02/2018 15:08     CODE STATUS:     Code Status Orders  (From admission, onward)         Start     Ordered   07/02/18 2309  Full code  Continuous     07/02/18 2308        Code Status History    Date Active Date Inactive Code Status Order ID Comments User Context   05/26/2018 0211 05/28/2018 1954 Full Code [id number]  Oralia Manis, MD Inpatient   03/18/2018 1409 03/27/2018 2130 Full Code [id number]  Milagros Loll, MD ED   02/09/2018 0921 02/15/2018 1549 Full Code [id number]  Barbaraann Rondo, MD Inpatient   11/28/2017 0352 12/04/2017 2009 Full Code [id number]  Barbaraann Rondo, MD Inpatient      TOTAL TIME TAKING CARE OF THIS PATIENT: *40* minutes.    Enedina Finner M.D on 07/03/2018 at 2:39 PM  Between 7am to 6pm - Pager - [phone] After 6pm go to [email or web address] - Social research officer, government  [facility]  Office  [phone]  CC: Primary care physician; Thomes Dinning, MD

## 2018-07-21 ENCOUNTER — Inpatient Hospital Stay
Admission: EM | Admit: 2018-07-21 | Discharge: 2018-07-25 | DRG: 391 | Disposition: A | Discharge status: Home / Self Care | Payer: Medicaid Other | Attending: Family Medicine | Admitting: Family Medicine

## 2018-07-21 ENCOUNTER — Emergency Department: Payer: Medicaid Other

## 2018-07-21 ENCOUNTER — Encounter: Payer: Self-pay | Admitting: Emergency Medicine

## 2018-07-21 ENCOUNTER — Other Ambulatory Visit: Payer: Self-pay

## 2018-07-21 DIAGNOSIS — K581 Irritable bowel syndrome with constipation: Principal | ICD-10-CM | POA: Diagnosis present

## 2018-07-21 DIAGNOSIS — Z883 Allergy status to other anti-infective agents status: Secondary | ICD-10-CM

## 2018-07-21 DIAGNOSIS — E119 Type 2 diabetes mellitus without complications: Secondary | ICD-10-CM

## 2018-07-21 DIAGNOSIS — Z888 Allergy status to other drugs, medicaments and biological substances status: Secondary | ICD-10-CM

## 2018-07-21 DIAGNOSIS — I10 Essential (primary) hypertension: Secondary | ICD-10-CM | POA: Diagnosis present

## 2018-07-21 DIAGNOSIS — R112 Nausea with vomiting, unspecified: Secondary | ICD-10-CM | POA: Diagnosis present

## 2018-07-21 DIAGNOSIS — Z809 Family history of malignant neoplasm, unspecified: Secondary | ICD-10-CM

## 2018-07-21 DIAGNOSIS — Z833 Family history of diabetes mellitus: Secondary | ICD-10-CM

## 2018-07-21 DIAGNOSIS — F17210 Nicotine dependence, cigarettes, uncomplicated: Secondary | ICD-10-CM | POA: Diagnosis present

## 2018-07-21 DIAGNOSIS — E1143 Type 2 diabetes mellitus with diabetic autonomic (poly)neuropathy: Secondary | ICD-10-CM | POA: Diagnosis present

## 2018-07-21 DIAGNOSIS — K3184 Gastroparesis: Secondary | ICD-10-CM | POA: Diagnosis present

## 2018-07-21 DIAGNOSIS — Z7901 Long term (current) use of anticoagulants: Secondary | ICD-10-CM

## 2018-07-21 DIAGNOSIS — K55069 Acute infarction of intestine, part and extent unspecified: Secondary | ICD-10-CM | POA: Diagnosis present

## 2018-07-21 DIAGNOSIS — E739 Lactose intolerance, unspecified: Secondary | ICD-10-CM | POA: Diagnosis present

## 2018-07-21 DIAGNOSIS — Z20828 Contact with and (suspected) exposure to other viral communicable diseases: Secondary | ICD-10-CM | POA: Diagnosis present

## 2018-07-21 DIAGNOSIS — R1084 Generalized abdominal pain: Secondary | ICD-10-CM

## 2018-07-21 DIAGNOSIS — F419 Anxiety disorder, unspecified: Secondary | ICD-10-CM | POA: Diagnosis present

## 2018-07-21 DIAGNOSIS — Z886 Allergy status to analgesic agent status: Secondary | ICD-10-CM

## 2018-07-21 DIAGNOSIS — D500 Iron deficiency anemia secondary to blood loss (chronic): Secondary | ICD-10-CM | POA: Diagnosis present

## 2018-07-21 DIAGNOSIS — Z882 Allergy status to sulfonamides status: Secondary | ICD-10-CM

## 2018-07-21 DIAGNOSIS — K219 Gastro-esophageal reflux disease without esophagitis: Secondary | ICD-10-CM | POA: Diagnosis present

## 2018-07-21 DIAGNOSIS — Z841 Family history of disorders of kidney and ureter: Secondary | ICD-10-CM

## 2018-07-21 DIAGNOSIS — Z8249 Family history of ischemic heart disease and other diseases of the circulatory system: Secondary | ICD-10-CM

## 2018-07-21 DIAGNOSIS — Z825 Family history of asthma and other chronic lower respiratory diseases: Secondary | ICD-10-CM

## 2018-07-21 DIAGNOSIS — D519 Vitamin B12 deficiency anemia, unspecified: Secondary | ICD-10-CM | POA: Diagnosis present

## 2018-07-21 DIAGNOSIS — K861 Other chronic pancreatitis: Secondary | ICD-10-CM | POA: Diagnosis present

## 2018-07-21 HISTORY — DX: Acute pancreatitis without necrosis or infection, unspecified: K85.90

## 2018-07-21 LAB — SARS CORONAVIRUS 2 BY RT PCR (HOSPITAL ORDER, PERFORMED IN ~~LOC~~ HOSPITAL LAB): SARS Coronavirus 2: NEGATIVE

## 2018-07-21 LAB — URINALYSIS, COMPLETE (UACMP) WITH MICROSCOPIC
Bilirubin Urine: NEGATIVE
Glucose, UA: NEGATIVE mg/dL
Hgb urine dipstick: NEGATIVE
Ketones, ur: 5 mg/dL — AB
Leukocytes,Ua: NEGATIVE
Nitrite: NEGATIVE
Protein, ur: 30 mg/dL — AB
Specific Gravity, Urine: 1.024 (ref 1.005–1.030)
Squamous Epithelial / HPF: >50 — ABNORMAL HIGH (ref 0–5)
pH: 6 (ref 5.0–8.0)

## 2018-07-21 LAB — COMPREHENSIVE METABOLIC PANEL
ALT: 22 U/L (ref 0–44)
AST: 26 U/L (ref 15–41)
Albumin: 4 g/dL (ref 3.5–5.0)
Alkaline Phosphatase: 54 U/L (ref 38–126)
Anion gap: 15 (ref 5–15)
BUN: 11 mg/dL (ref 6–20)
CO2: 22 mmol/L (ref 22–32)
Calcium: 9.5 mg/dL (ref 8.9–10.3)
Chloride: 102 mmol/L (ref 98–111)
Creatinine, Ser: 0.95 mg/dL (ref 0.44–1.00)
GFR calc Af Amer: >60 mL/min (ref >60)
GFR calc non Af Amer: >60 mL/min (ref >60)
Glucose, Bld: 177 mg/dL — ABNORMAL HIGH (ref 70–99)
Potassium: 3.4 mmol/L — ABNORMAL LOW (ref 3.5–5.1)
Sodium: 139 mmol/L (ref 135–145)
Total Bilirubin: 0.7 mg/dL (ref 0.3–1.2)
Total Protein: 7.9 g/dL (ref 6.5–8.1)

## 2018-07-21 LAB — CBC
HCT: 33.4 % — ABNORMAL LOW (ref 36.0–46.0)
Hemoglobin: 10.1 g/dL — ABNORMAL LOW (ref 12.0–15.0)
MCH: 21.6 pg — ABNORMAL LOW (ref 26.0–34.0)
MCHC: 30.2 g/dL (ref 30.0–36.0)
MCV: 71.5 fL — ABNORMAL LOW (ref 80.0–100.0)
Platelets: 335 10*3/uL (ref 150–400)
RBC: 4.67 MIL/uL (ref 3.87–5.11)
RDW: 16.9 % — ABNORMAL HIGH (ref 11.5–15.5)
WBC: 10.6 10*3/uL — ABNORMAL HIGH (ref 4.0–10.5)
nRBC: 0 % (ref 0.0–0.2)

## 2018-07-21 LAB — LACTIC ACID, PLASMA: Lactic Acid, Venous: 1.2 mmol/L (ref 0.5–1.9)

## 2018-07-21 LAB — LIPASE, BLOOD: Lipase: 24 U/L (ref 11–51)

## 2018-07-21 MED ORDER — SODIUM CHLORIDE 0.9 % IV BOLUS
500.0000 mL | Freq: Once | INTRAVENOUS | Status: AC
  Administered 2018-07-21: 500 mL via INTRAVENOUS

## 2018-07-21 MED ORDER — PROMETHAZINE HCL 25 MG/ML IJ SOLN
12.5000 mg | Freq: Once | INTRAMUSCULAR | Status: AC
  Administered 2018-07-21: 16:00:00 12.5 mg via INTRAMUSCULAR
  Filled 2018-07-21: qty 1

## 2018-07-21 MED ORDER — APIXABAN 5 MG PO TABS
5.0000 mg | ORAL_TABLET | Freq: Two times a day (BID) | ORAL | Status: DC
  Administered 2018-07-22 – 2018-07-23 (×3): 5 mg via ORAL
  Filled 2018-07-21 (×3): qty 1

## 2018-07-21 MED ORDER — IOPAMIDOL (ISOVUE-370) INJECTION 76%
100.0000 mL | Freq: Once | INTRAVENOUS | Status: AC | PRN
  Administered 2018-07-21: 100 mL via INTRAVENOUS

## 2018-07-21 MED ORDER — METOCLOPRAMIDE HCL 5 MG/ML IJ SOLN: 5.0000 mg | Freq: Four times a day (QID) | INTRAMUSCULAR | Status: DC | PRN

## 2018-07-21 MED ORDER — INSULIN ASPART 100 UNIT/ML ~~LOC~~ SOLN
0.0000 [IU] | Freq: Three times a day (TID) | SUBCUTANEOUS | Status: DC
  Administered 2018-07-23: 2 [IU] via SUBCUTANEOUS
  Filled 2018-07-21: qty 1

## 2018-07-21 MED ORDER — TRAMADOL HCL 50 MG PO TABS
50.0000 mg | ORAL_TABLET | Freq: Four times a day (QID) | ORAL | Status: DC | PRN
  Administered 2018-07-22 – 2018-07-25 (×10): 50 mg via ORAL
  Filled 2018-07-21 (×10): qty 1

## 2018-07-21 MED ORDER — MORPHINE SULFATE (PF) 4 MG/ML IV SOLN
4.0000 mg | Freq: Once | INTRAVENOUS | Status: AC
  Administered 2018-07-21: 4 mg via INTRAVENOUS
  Filled 2018-07-21: qty 1

## 2018-07-21 MED ORDER — PANTOPRAZOLE SODIUM 20 MG PO TBEC
20.0000 mg | DELAYED_RELEASE_TABLET | Freq: Two times a day (BID) | ORAL | Status: DC
  Administered 2018-07-22 – 2018-07-23 (×3): 20 mg via ORAL
  Filled 2018-07-21 (×5): qty 1

## 2018-07-21 MED ORDER — HYDROXYZINE HCL 50 MG/ML IM SOLN
25.0000 mg | Freq: Once | INTRAMUSCULAR | Status: AC
  Administered 2018-07-22: 25 mg via INTRAMUSCULAR
  Filled 2018-07-21: qty 0.5

## 2018-07-21 MED ORDER — ACETAMINOPHEN 325 MG PO TABS
650.0000 mg | ORAL_TABLET | Freq: Four times a day (QID) | ORAL | Status: DC | PRN
  Administered 2018-07-22 – 2018-07-23 (×3): 650 mg via ORAL
  Filled 2018-07-21 (×3): qty 2

## 2018-07-21 MED ORDER — ENOXAPARIN SODIUM 40 MG/0.4ML ~~LOC~~ SOLN: 40.0000 mg | SUBCUTANEOUS | Status: DC

## 2018-07-21 MED ORDER — PROMETHAZINE HCL 25 MG/ML IJ SOLN
12.5000 mg | Freq: Once | INTRAMUSCULAR | Status: AC
  Administered 2018-07-21: 20:00:00 12.5 mg via INTRAMUSCULAR
  Filled 2018-07-21: qty 1

## 2018-07-21 MED ORDER — PROCHLORPERAZINE EDISYLATE 10 MG/2ML IJ SOLN: 5.0000 mg | Freq: Four times a day (QID) | INTRAMUSCULAR | Status: DC | PRN

## 2018-07-21 MED ORDER — DIPHENHYDRAMINE HCL 25 MG PO CAPS
25.0000 mg | ORAL_CAPSULE | Freq: Once | ORAL | Status: AC
  Administered 2018-07-21: 23:00:00 25 mg via ORAL
  Filled 2018-07-21: qty 1

## 2018-07-21 MED ORDER — CLONAZEPAM 1 MG PO TABS: 1.0000 mg | ORAL_TABLET | Freq: Three times a day (TID) | ORAL | Status: DC

## 2018-07-21 MED ORDER — PANCRELIPASE (LIP-PROT-AMYL) 12000-38000 UNITS PO CPEP
24000.0000 [IU] | ORAL_CAPSULE | Freq: Three times a day (TID) | ORAL | Status: DC
  Administered 2018-07-22 – 2018-07-25 (×7): 24000 [IU] via ORAL
  Filled 2018-07-21 (×12): qty 2

## 2018-07-21 MED ORDER — PREGABALIN 75 MG PO CAPS
200.0000 mg | ORAL_CAPSULE | Freq: Three times a day (TID) | ORAL | Status: DC
  Administered 2018-07-22 – 2018-07-25 (×10): 200 mg via ORAL
  Filled 2018-07-21 (×10): qty 1

## 2018-07-21 MED ORDER — METOCLOPRAMIDE HCL 5 MG/ML IJ SOLN
10.0000 mg | Freq: Three times a day (TID) | INTRAMUSCULAR | Status: DC | PRN
  Administered 2018-07-22: 10:00:00 10 mg via INTRAVENOUS
  Filled 2018-07-21: qty 2

## 2018-07-21 MED ORDER — LORAZEPAM 2 MG/ML IJ SOLN
0.5000 mg | Freq: Once | INTRAMUSCULAR | Status: AC
  Administered 2018-07-21: 0.5 mg via INTRAVENOUS
  Filled 2018-07-21: qty 1

## 2018-07-21 MED ORDER — ACETAMINOPHEN 650 MG RE SUPP: 650.0000 mg | Freq: Four times a day (QID) | RECTAL | Status: DC | PRN

## 2018-07-21 MED ORDER — CLONAZEPAM 1 MG PO TABS
1.0000 mg | ORAL_TABLET | Freq: Three times a day (TID) | ORAL | Status: DC
  Administered 2018-07-22 – 2018-07-23 (×7): 1 mg via ORAL
  Filled 2018-07-21 (×7): qty 1

## 2018-07-21 MED ORDER — MORPHINE SULFATE (PF) 4 MG/ML IV SOLN
4.0000 mg | Freq: Once | INTRAVENOUS | Status: AC
  Administered 2018-07-21: 16:00:00 4 mg via INTRAVENOUS
  Filled 2018-07-21: qty 1

## 2018-07-21 MED ORDER — PROCHLORPERAZINE EDISYLATE 10 MG/2ML IJ SOLN
10.0000 mg | Freq: Four times a day (QID) | INTRAMUSCULAR | Status: DC | PRN
  Administered 2018-07-22: 01:00:00 10 mg via INTRAVENOUS
  Filled 2018-07-21 (×2): qty 2

## 2018-07-21 MED ORDER — PANTOPRAZOLE SODIUM 40 MG IV SOLR
40.0000 mg | Freq: Once | INTRAVENOUS | Status: AC
  Administered 2018-07-21: 40 mg via INTRAVENOUS
  Filled 2018-07-21: qty 40

## 2018-07-21 MED ORDER — ATORVASTATIN CALCIUM 20 MG PO TABS
40.0000 mg | ORAL_TABLET | Freq: Every day | ORAL | Status: DC
  Administered 2018-07-22 – 2018-07-24 (×3): 40 mg via ORAL
  Filled 2018-07-21 (×3): qty 2

## 2018-07-21 MED ORDER — SODIUM CHLORIDE 0.9 % IV SOLN
INTRAVENOUS | Status: AC
  Administered 2018-07-22: 01:00:00 via INTRAVENOUS

## 2018-07-21 MED ORDER — ALUM & MAG HYDROXIDE-SIMETH 200-200-20 MG/5ML PO SUSP
30.0000 mL | Freq: Once | ORAL | Status: AC
  Administered 2018-07-21: 20:00:00 30 mL via ORAL
  Filled 2018-07-21: qty 30

## 2018-07-21 MED ORDER — MORPHINE SULFATE (PF) 4 MG/ML IV SOLN
4.0000 mg | Freq: Once | INTRAVENOUS | Status: AC
  Administered 2018-07-21: 20:00:00 4 mg via INTRAVENOUS
  Filled 2018-07-21: qty 1

## 2018-07-21 MED ORDER — FLUOXETINE HCL 20 MG PO CAPS
60.0000 mg | ORAL_CAPSULE | Freq: Every day | ORAL | Status: DC
  Administered 2018-07-22 – 2018-07-25 (×4): 60 mg via ORAL
  Filled 2018-07-21 (×4): qty 3

## 2018-07-21 NOTE — H&P (Signed)
[facility] - [place] [facility]   PATIENT NAME: Terri LotDiane Yoon    MR#:  [id number]  DATE OF BIRTH:  1975/10/01  DATE OF ADMISSION:  07/21/2018  PRIMARY CARE PHYSICIAN: Thomes DinningWeeks, Cynthia, MD   REQUESTING/REFERRING PHYSICIAN: Fanny BienQuale, MD  CHIEF COMPLAINT:   Chief Complaint  Patient presents with  . Abdominal Pain    HISTORY OF PRESENT ILLNESS:  Terri Berry  is a 43 y.o. female who presents with chief complaint as above.  Patient presents to the ED with a complaint of 2 days abdominal pain with nausea and vomiting.  She states that her pain feels the same as it did when she had pancreatitis.  She states that she decreased her p.o. intake significantly hoping that that would help improve her pain, however it did not.  Here in the ED tonight her lipase is normal.  CT of her abdomen and pelvis does not show anything that would explain her pain.  She had persistent nausea and vomiting despite multiple medications given in the ED.  Hospitalist were called for admission  PAST MEDICAL HISTORY:   Past Medical History:  Diagnosis Date  . Allergy   . Anxiety   . Depression   . GERD (gastroesophageal reflux disease)   . Hypertension   . IBS (irritable bowel syndrome)   . Pancreatitis      PAST SURGICAL HISTORY:   Past Surgical History:  Procedure Laterality Date  . ABDOMINAL HYSTERECTOMY    . GALLBLADDER SURGERY       SOCIAL HISTORY:   Social History   Tobacco Use  . Smoking status: Current Every Day Smoker    Packs/day: 0.50    Types: Cigarettes  . Smokeless tobacco: Never Used  Substance Use Topics  . Alcohol use: Not Currently     FAMILY HISTORY:   Family History  Problem Relation Age of Onset  . Diabetes Father   . Heart disease Father   . Kidney disease Father   . COPD Father   . Cancer Paternal Aunt   . Diabetes Paternal Uncle   . Heart disease Paternal Uncle      DRUG ALLERGIES:   Allergies  Allergen Reactions  . Precedex  [Dexmedetomidine Hcl In Nacl] Other (See Comments)    Significant Bradycardia  . Aspirin     Not indicated due to other anticoagulant use.  Marland Kitchen. Cefpodoxime     Tolerates augmentin and Keflex   . Clarithromycin   . Doxycycline   . Ferrous Gluconate   . Lactose Intolerance (Gi)   . Levofloxacin   . Rizatriptan   . Sulfa Antibiotics   . Topiramate   . Venlafaxine   . Zofran [Ondansetron Hcl]     MEDICATIONS AT HOME:   Prior to Admission medications   Medication Sig Start Date End Date Taking? Authorizing Provider  albuterol (PROVENTIL HFA;VENTOLIN HFA) 108 (90 Base) MCG/ACT inhaler Inhale 2 puffs into the lungs every 6 (six) hours as needed for wheezing or shortness of breath. 02/15/18  Yes Shaune Pollackhen, Qing, MD  apixaban (ELIQUIS) 5 MG TABS tablet Take 5 mg by mouth every 12 (twelve) hours. 04/20/18 07/21/18 Yes [provider]  atorvastatin (LIPITOR) 40 MG tablet Take 40 mg by mouth daily. 05/07/18 11/03/18 Yes [provider]  busPIRone (BUSPAR) 10 MG tablet Take 10 mg by mouth 3 (three) times daily. 04/20/18 11/03/18 Yes [provider]  cetirizine (ZYRTEC) 10 MG tablet Take 10 mg by mouth daily.   Yes [provider]  clonazePAM (KLONOPIN) 1 MG tablet Take 1 mg by mouth 3 (three) times daily. 05/07/18 08/05/18 Yes [provider]  dicyclomine (BENTYL) 10 MG capsule Take 10 mg by mouth 3 (three) times daily as needed for spasms. 05/11/18 05/11/19 Yes [provider]  fenofibrate 160 MG tablet Take 160 mg by mouth daily. 05/07/18 11/03/18 Yes [provider]  FLUoxetine (PROZAC) 20 MG capsule Take 60 mg by mouth daily. 06/12/18 12/09/18 Yes [provider]  hydrocortisone (ANUSOL-HC) 25 MG suppository Place 25 mg rectally 2 (two) times a day. 06/12/18  Yes [provider]  Melatonin 3 MG TABS Take 6 mg by mouth at bedtime.   Yes [provider]  mirtazapine (REMERON) 30 MG tablet Take 30 mg by mouth at bedtime. 05/19/18  Yes  [provider]  omeprazole (PRILOSEC) 20 MG capsule Take 40 mg by mouth 2 (two) times daily. 05/07/18  Yes [provider]  Pancrelipase, Lip-Prot-Amyl, 24000-76000 units CPEP Take 2 capsules by mouth 3 (three) times daily with meals.   Yes [provider]  pregabalin (LYRICA) 200 MG capsule Take 200 mg by mouth 3 (three) times daily. 05/07/18  Yes [provider]  prochlorperazine (COMPAZINE) 10 MG tablet Take 10 mg by mouth daily. 04/20/18  Yes [provider]  promethazine (PHENERGAN) 25 MG tablet Take 25 mg by mouth at bedtime as needed for nausea. 04/22/18  Yes [provider]  senna (SENOKOT) 8.6 MG TABS tablet Take 4 tablets by mouth daily.   Yes [provider]  tiZANidine (ZANAFLEX) 4 MG tablet Take 4 mg by mouth 2 (two) times daily as needed for muscle spasms. 05/22/18 05/22/19 Yes [provider]  Vitamin D, Ergocalciferol, (DRISDOL) 1.25 MG (50000 UT) CAPS capsule Take 50,000 Units by mouth once a week. 04/20/18 07/24/18 Yes [provider]    REVIEW OF SYSTEMS:  Review of Systems  Constitutional: Negative for chills, fever, malaise/fatigue and weight loss.  HENT: Negative for ear pain, hearing loss and tinnitus.   Eyes: Negative for blurred vision, double vision, pain and redness.  Respiratory: Negative for cough, hemoptysis and shortness of breath.   Cardiovascular: Negative for chest pain, palpitations, orthopnea and leg swelling.  Gastrointestinal: Positive for abdominal pain, nausea and vomiting. Negative for constipation and diarrhea.  Genitourinary: Negative for dysuria, frequency and hematuria.  Musculoskeletal: Negative for back pain, joint pain and neck pain.  Skin:       No acne, rash, or lesions  Neurological: Negative for dizziness, tremors, focal weakness and weakness.  Endo/Heme/Allergies: Negative for polydipsia. Does not bruise/bleed easily.  Psychiatric/Behavioral: Negative for depression.  The patient is not nervous/anxious and does not have insomnia.      VITAL SIGNS:   Vitals:   07/21/18 1930 07/21/18 1945 07/21/18 2045 07/21/18 2100  BP: 124/79   (!) 112/59  Pulse: 69 67 76 78  Resp:    18  Temp:      TempSrc:      SpO2: 96% 96% 94% 95%  Weight:      Height:       Wt Readings from Last 3 Encounters:  07/21/18 90.7 kg  07/02/18 90.7 kg  05/30/18 91 kg    PHYSICAL EXAMINATION:  Physical Exam  Vitals reviewed. Constitutional: She is oriented to person, place, and time. She appears well-developed and well-nourished. No distress.  HENT:  Head: Normocephalic and atraumatic.  Mouth/Throat: Oropharynx is clear and moist.  Eyes: Pupils are equal, round, and reactive  to light. Conjunctivae and EOM are normal. No scleral icterus.  Neck: Normal range of motion. Neck supple. No JVD present. No thyromegaly present.  Cardiovascular: Normal rate, regular rhythm and intact distal pulses. Exam reveals no gallop and no friction rub.  No murmur heard. Respiratory: Effort normal and breath sounds normal. No respiratory distress. She has no wheezes. She has no rales.  GI: Soft. Bowel sounds are normal. She exhibits no distension. There is abdominal tenderness.  Musculoskeletal: Normal range of motion.        General: No edema.     Comments: No arthritis, no gout  Lymphadenopathy:    She has no cervical adenopathy.  Neurological: She is alert and oriented to person, place, and time. No cranial nerve deficit.  No dysarthria, no aphasia  Skin: Skin is warm and dry. No rash noted. No erythema.  Psychiatric: She has a normal mood and affect. Her behavior is normal. Judgment and thought content normal.    LABORATORY PANEL:   CBC Recent Labs  Lab 07/21/18 1205  WBC 10.6*  HGB 10.1*  HCT 33.4*  PLT 335   ------------------------------------------------------------------------------------------------------------------  Chemistries  Recent Labs  Lab 07/21/18 1205  NA  139  K 3.4*  CL 102  CO2 22  GLUCOSE 177*  BUN 11  CREATININE 0.95  CALCIUM 9.5  AST 26  ALT 22  ALKPHOS 54  BILITOT 0.7   ------------------------------------------------------------------------------------------------------------------  Cardiac Enzymes No results for input(s): TROPONINI in the last 168 hours. ------------------------------------------------------------------------------------------------------------------  RADIOLOGY:  Ct Angio Abd/pel W And/or Wo Contrast  Result Date: 07/21/2018 CLINICAL DATA:  Acute abdominal pain EXAM: CT ANGIOGRAPHY ABDOMEN AND PELVIS WITH CONTRAST AND WITHOUT CONTRAST TECHNIQUE: Multidetector CT imaging of the abdomen and pelvis was performed using the standard protocol during bolus administration of intravenous contrast. Multiplanar reconstructed images and MIPs were obtained and reviewed to evaluate the vascular anatomy. CONTRAST:  100mL ISOVUE-370 IOPAMIDOL (ISOVUE-370) INJECTION 76% COMPARISON:  04/02/2018 FINDINGS: VASCULAR Aorta: Very minor wall irregularity compatible with minimal intimal thickening/early atherosclerosis. Intact aorta without aneurysm or dissection. No occlusive process. No retroperitoneal hemorrhage or hematoma. Celiac: Widely patent including its branches. SMA: Widely patent including its branches. Replaced right hepatic artery noted off the SMA. Renals: Widely patent renal origins without acute finding. IMA: Remains patent off the distal aorta including its branches Inflow: Pelvic iliac vasculature remain patent. No inflow disease or occlusion. The common, internal, and external iliac arteries are patent. Proximal Outflow: Bilateral common femoral and visualized portions of the superficial and profunda femoral arteries are patent without evidence of aneurysm, dissection, vasculitis or significant stenosis. Veins: No veno-occlusive process. Review of the MIP images confirms the above findings. NON-VASCULAR Lower chest: Bibasilar  atelectasis and hypoventilatory changes with mild patchy ground-glass attenuation. Normal heart size. No pericardial pleural effusion. Hepatobiliary: No focal liver abnormality is seen. Status post cholecystectomy. No biliary dilatation. Pancreas: Unremarkable. No pancreatic ductal dilatation or surrounding inflammatory changes. Spleen: Normal in size without focal abnormality. Adrenals/Urinary Tract: Adrenal glands are unremarkable. Kidneys are normal, without renal calculi, focal lesion, or hydronephrosis. Bladder is unremarkable. Stomach/Bowel: Stomach is within normal limits. Appendix appears normal. No evidence of bowel wall thickening, distention, or inflammatory changes. Lymphatic: No adenopathy. Mildly prominent inguinal lymph nodes noted bilaterally Reproductive: Remote hysterectomy. Small follicles present on both ovaries. Ovaries are normal in size. No pelvic free fluid or fluid collection. Other: No abdominal wall hernia or abnormality. No abdominopelvic ascites. Musculoskeletal: Degenerative changes of the spine. No acute osseous finding. L4-5 sclerotic  degenerative change with vacuum disc phenomenon. No compression fracture. IMPRESSION: VASCULAR No significant or acute vascular finding by CTA. NON-VASCULAR Minor basilar atelectasis/hypoventilatory changes. Remote cholecystectomy and hysterectomy No other acute intra-abdominal or pelvic finding by CT. Electronically Signed   By: Jerilynn Mages.  Shick M.D.   On: 07/21/2018 16:00    EKG:   Orders placed or performed during the hospital encounter of 07/21/18  . EKG 12-Lead  . EKG 12-Lead    IMPRESSION AND PLAN:  Principal Problem:   Intractable nausea and vomiting -PRN antiemetics, IV fluids, unclear etiology though the patient does have a history of IBS.  She also has a listed history of diabetes though she states she does not have diabetes.  She states that she was put on metformin years ago, but then her doctor recheck her labs and told her she did not  need it anymore.  This could potentially be something like gastroparesis. Active Problems:   HTN (hypertension) -home dose antihypertensives   Diabetes (HCC) -lighting scale insulin, check hemoglobin A1c as the patient's glucose was elevated tonight   GERD (gastroesophageal reflux disease) -home dose PPI   Anxiety -home dose anxiolytic  Chart review performed and case discussed with ED provider. Labs, imaging and/or ECG reviewed by provider and discussed with patient/family. Management plans discussed with the patient and/or family.  COVID-19 status: Tested negative     DVT PROPHYLAXIS: SubQ lovenox   GI PROPHYLAXIS:  PPI   ADMISSION STATUS: Observation  CODE STATUS: Full Code Status History    Date Active Date Inactive Code Status Order ID Comments User Context   07/02/2018 2308 07/03/2018 2205 Full Code [id number]  Lance Coon, MD Inpatient   05/26/2018 0211 05/28/2018 1954 Full Code [id number]  Lance Coon, MD Inpatient   03/18/2018 1409 03/27/2018 2130 Full Code [id number]  Hillary Bow, MD ED   02/09/2018 0921 02/15/2018 1549 Full Code [id number]  Arta Silence, MD Inpatient   11/28/2017 0352 12/04/2017 2009 Full Code [id number]  Arta Silence, MD Inpatient   Advance Care Planning Activity      TOTAL TIME TAKING CARE OF THIS PATIENT: 40 minutes.   This patient was evaluated in the context of the global COVID-19 pandemic, which necessitated consideration that the patient might be at risk for infection with the SARS-CoV-2 virus that causes COVID-19. Institutional protocols and algorithms that pertain to the evaluation of patients at risk for COVID-19 are in a state of rapid change based on information released by regulatory bodies including the CDC and federal and state organizations. These policies and algorithms were followed to the best of this provider's knowledge to date during the patient's care at this facility.  Ethlyn Daniels 07/21/2018, 10:15 PM  [facility]  Office  [phone]  CC: Primary care physician; Joanie Coddington, MD  Note:  This document was prepared using Dragon voice recognition software and may include unintentional dictation errors.

## 2018-07-21 NOTE — ED Notes (Signed)
Pt given meal tray.

## 2018-07-21 NOTE — ED Provider Notes (Signed)
[facility] Emergency Department Provider Note   ____________________________________________   First MD Initiated Contact with Patient 07/21/18 1452     (approximate)  I have reviewed the triage vital signs and the nursing notes.   HISTORY  Chief Complaint Abdominal Pain    HPI Claretta Kendra is a 43 y.o. female history of pancreatitis, venous thrombus, reflux, hypertension, subclavian stenosis  Patient denies any fevers or chills.  No exposure anyone known to have coronavirus.  Was hospitalized few weeks ago for low blood pressure and being dehydrated she reports  She reports that 2 to 3 days ago started experiencing pain that felt like "pancreatitis" so she slowed down her eating and is been just drinking a little bit of fluid because she is trying to calm it down.  However she is continued to have increasing pain throughout the upper abdomen.  No trouble breathing.  No chest pain.  Prior hysterectomy.  No pain or burning with urination.  Patient reports she thinks her pancreas is flared up again.  Previously had gallbladder removed.  Currently on Eliquis    Past Medical History:  Diagnosis Date   Allergy    Anxiety    Depression    GERD (gastroesophageal reflux disease)    Hypertension    IBS (irritable bowel syndrome)    Pancreatitis     Patient Active Problem List   Diagnosis Date Noted   Intractable nausea and vomiting 07/21/2018   Acute renal failure ([place]) 07/03/2018   Hypotension 05/26/2018   Subclavian arterial stenosis ([place]) 05/26/2018   Diabetes ([place]) 05/26/2018   GERD (gastroesophageal reflux disease) 05/25/2018   HTN (hypertension) 05/25/2018   Anxiety 05/25/2018   AKI (acute kidney injury) ([place]) 05/25/2018   Distended abdomen    Acute pancreatitis 03/18/2018   Acute hypoxemic respiratory failure ([place]) 02/09/2018   Overdose 11/28/2017    Past Surgical History:  Procedure Laterality Date   ABDOMINAL  HYSTERECTOMY     GALLBLADDER SURGERY      Prior to Admission medications   Medication Sig Start Date End Date Taking? Authorizing Provider  albuterol (PROVENTIL HFA;VENTOLIN HFA) 108 (90 Base) MCG/ACT inhaler Inhale 2 puffs into the lungs every 6 (six) hours as needed for wheezing or shortness of breath. 02/15/18  Yes Demetrios Loll, MD  apixaban (ELIQUIS) 5 MG TABS tablet Take 5 mg by mouth every 12 (twelve) hours. 04/20/18 07/21/18 Yes [provider]  atorvastatin (LIPITOR) 40 MG tablet Take 40 mg by mouth daily. 05/07/18 11/03/18 Yes [provider]  busPIRone (BUSPAR) 10 MG tablet Take 10 mg by mouth 3 (three) times daily. 04/20/18 11/03/18 Yes [provider]  cetirizine (ZYRTEC) 10 MG tablet Take 10 mg by mouth daily.   Yes [provider]  clonazePAM (KLONOPIN) 1 MG tablet Take 1 mg by mouth 3 (three) times daily. 05/07/18 08/05/18 Yes [provider]  dicyclomine (BENTYL) 10 MG capsule Take 10 mg by mouth 3 (three) times daily as needed for spasms. 05/11/18 05/11/19 Yes [provider]  fenofibrate 160 MG tablet Take 160 mg by mouth daily. 05/07/18 11/03/18 Yes [provider]  FLUoxetine (PROZAC) 20 MG capsule Take 60 mg by mouth daily. 06/12/18 12/09/18 Yes [provider]  hydrocortisone (ANUSOL-HC) 25 MG suppository Place 25 mg rectally 2 (two) times a day. 06/12/18  Yes [provider]  Melatonin 3 MG TABS Take 6 mg by mouth at bedtime.   Yes [provider]  mirtazapine (REMERON) 30 MG tablet Take  30 mg by mouth at bedtime. 05/19/18  Yes [provider]  omeprazole (PRILOSEC) 20 MG capsule Take 40 mg by mouth 2 (two) times daily. 05/07/18  Yes [provider]  Pancrelipase, Lip-Prot-Amyl, 24000-76000 units CPEP Take 2 capsules by mouth 3 (three) times daily with meals.   Yes [provider]  pregabalin (LYRICA) 200 MG capsule Take 200 mg by mouth 3 (three) times daily. 05/07/18  Yes [provider]  prochlorperazine (COMPAZINE) 10 MG tablet Take 10 mg by mouth daily. 04/20/18  Yes [provider]  promethazine (PHENERGAN) 25 MG tablet Take 25 mg by mouth at bedtime as needed for nausea. 04/22/18  Yes [provider]  senna (SENOKOT) 8.6 MG TABS tablet Take 4 tablets by mouth daily.   Yes [provider]  tiZANidine (ZANAFLEX) 4 MG tablet Take 4 mg by mouth 2 (two) times daily as needed for muscle spasms. 05/22/18 05/22/19 Yes [provider]  Vitamin D, Ergocalciferol, (DRISDOL) 1.25 MG (50000 UT) CAPS capsule Take 50,000 Units by mouth once a week. 04/20/18 07/24/18 Yes [provider]    Allergies Precedex [dexmedetomidine hcl in nacl], Aspirin, Cefpodoxime, Clarithromycin, Doxycycline, Ferrous gluconate, Lactose intolerance (gi), Levofloxacin, Rizatriptan, Sulfa antibiotics, Topiramate, Venlafaxine, and Zofran [ondansetron hcl]  Family History  Problem Relation Age of Onset   Diabetes Father    Heart disease Father    Kidney disease Father    COPD Father    Cancer Paternal Aunt    Diabetes Paternal Uncle    Heart disease Paternal Uncle     Social History Social History   Tobacco Use   Smoking status: Current Every Day Smoker    Packs/day: 0.50    Types: Cigarettes   Smokeless tobacco: Never Used  Substance Use Topics   Alcohol use: Not Currently   Drug use: Not Currently    Review of Systems Constitutional: No fever/chills Eyes: No visual changes. ENT: No sore throat. Cardiovascular: Denies chest pain. Respiratory: Denies shortness of breath. Gastrointestinal: See HPI.  Not vomiting just very nauseated.  No diarrhea. Genitourinary: Negative for dysuria. Musculoskeletal: Negative for back pain. Skin: Negative for rash. Neurological: Negative for headaches, areas of focal weakness or numbness.    ____________________________________________   PHYSICAL EXAM:  VITAL SIGNS: ED Triage Vitals    Enc Vitals Group     BP 07/21/18 1201 113/79     Pulse Rate 07/21/18 1201 81     Resp 07/21/18 1201 (!) 22     Temp 07/21/18 1201 98.3 F (36.8 C)     Temp Source 07/21/18 1201 Oral     SpO2 07/21/18 1201 98 %     Weight 07/21/18 1202 200 lb (90.7 kg)     Height 07/21/18 1202 5\' 7"  (1.702 m)     Head Circumference --      Peak Flow --      Pain Score 07/21/18 1201 10     Pain Loc --      Pain Edu? --      Excl. in GC? --     Constitutional: Alert and oriented. Well appearing and in no acute distress does not appear in some discomfort holding her hand over her stomach as I enter the room wincing slightly. Eyes: Conjunctivae are normal. Head: Atraumatic. Nose: No congestion/rhinnorhea. Mouth/Throat: Mucous membranes are moist. Neck: No stridor.  Cardiovascular: Normal rate, regular rhythm. Grossly normal heart sounds.  Good peripheral circulation. Respiratory: Normal respiratory effort.  No retractions. Lungs CTAB. Gastrointestinal:  Soft but fairly tender throughout, seems more tender in the upper abdomen possibly more right than left.  Some pain to percussion, question some mild peritonitis type symptoms Musculoskeletal: No lower extremity tenderness nor edema. Neurologic:  Normal speech and language. No gross focal neurologic deficits are appreciated.  Skin:  Skin is warm, dry and intact. No rash noted. Psychiatric: Mood and affect are normal. Speech and behavior are normal.  ____________________________________________   LABS (all labs ordered are listed, but only abnormal results are displayed)  Labs Reviewed  COMPREHENSIVE METABOLIC PANEL - Abnormal; Notable for the following components:      Result Value   Potassium 3.4 (*)    Glucose, Bld 177 (*)    All other components within normal limits  CBC - Abnormal; Notable for the following components:   WBC 10.6 (*)    Hemoglobin 10.1 (*)    HCT 33.4 (*)    MCV 71.5 (*)    MCH 21.6 (*)    RDW 16.9 (*)    All other  components within normal limits  URINALYSIS, COMPLETE (UACMP) WITH MICROSCOPIC - Abnormal; Notable for the following components:   Color, Urine AMBER (*)    APPearance CLOUDY (*)    Ketones, ur 5 (*)    Protein, ur 30 (*)    Bacteria, UA FEW (*)    Squamous Epithelial / LPF >50 (*)    All other components within normal limits  SARS CORONAVIRUS 2 (HOSPITAL ORDER, PERFORMED IN [facility])  LIPASE, BLOOD  LACTIC ACID, PLASMA  HEMOGLOBIN A1C   ____________________________________________  EKG  Reviewed entered by me at 1205 Heart rate 80 QRS 80 QTc 480 EKG is reviewed entered by me, no acute ischemia denoted ____________________________________________  RADIOLOGY  Ct Angio Abd/pel W And/or Wo Contrast  Result Date: 07/21/2018 CLINICAL DATA:  Acute abdominal pain EXAM: CT ANGIOGRAPHY ABDOMEN AND PELVIS WITH CONTRAST AND WITHOUT CONTRAST TECHNIQUE: Multidetector CT imaging of the abdomen and pelvis was performed using the standard protocol during bolus administration of intravenous contrast. Multiplanar reconstructed images and MIPs were obtained and reviewed to evaluate the vascular anatomy. CONTRAST:  100mL ISOVUE-370 IOPAMIDOL (ISOVUE-370) INJECTION 76% COMPARISON:  04/02/2018 FINDINGS: VASCULAR Aorta: Very minor wall irregularity compatible with minimal intimal thickening/early atherosclerosis. Intact aorta without aneurysm or dissection. No occlusive process. No retroperitoneal hemorrhage or hematoma. Celiac: Widely patent including its branches. SMA: Widely patent including its branches. Replaced right hepatic artery noted off the SMA. Renals: Widely patent renal origins without acute finding. IMA: Remains patent off the distal aorta including its branches Inflow: Pelvic iliac vasculature remain patent. No inflow disease or occlusion. The common, internal, and external iliac arteries are patent. Proximal Outflow: Bilateral common femoral and visualized portions of the  superficial and profunda femoral arteries are patent without evidence of aneurysm, dissection, vasculitis or significant stenosis. Veins: No veno-occlusive process. Review of the MIP images confirms the above findings. NON-VASCULAR Lower chest: Bibasilar atelectasis and hypoventilatory changes with mild patchy ground-glass attenuation. Normal heart size. No pericardial pleural effusion. Hepatobiliary: No focal liver abnormality is seen. Status post cholecystectomy. No biliary dilatation. Pancreas: Unremarkable. No pancreatic ductal dilatation or surrounding inflammatory changes. Spleen: Normal in size without focal abnormality. Adrenals/Urinary Tract: Adrenal glands are unremarkable. Kidneys are normal, without renal calculi, focal lesion, or hydronephrosis. Bladder is unremarkable. Stomach/Bowel: Stomach is within normal limits. Appendix appears normal. No evidence of bowel wall thickening, distention, or inflammatory changes. Lymphatic: No adenopathy. Mildly prominent inguinal lymph nodes noted bilaterally Reproductive:  Remote hysterectomy. Small follicles present on both ovaries. Ovaries are normal in size. No pelvic free fluid or fluid collection. Other: No abdominal wall hernia or abnormality. No abdominopelvic ascites. Musculoskeletal: Degenerative changes of the spine. No acute osseous finding. L4-5 sclerotic degenerative change with vacuum disc phenomenon. No compression fracture. IMPRESSION: VASCULAR No significant or acute vascular finding by CTA. NON-VASCULAR Minor basilar atelectasis/hypoventilatory changes. Remote cholecystectomy and hysterectomy No other acute intra-abdominal or pelvic finding by CT. Electronically Signed   By: Judie PetitM.  Shick M.D.   On: 07/21/2018 16:00   CT scan reviewed, no acute findings are noted. ____________________________________________   PROCEDURES  Procedure(s) performed: None  Procedures  Critical Care performed:  No  ____________________________________________   INITIAL IMPRESSION / ASSESSMENT AND PLAN / ED COURSE  Pertinent labs & imaging results that were available during my care of the patient were reviewed by me and considered in my medical decision making (see chart for details).   Reviewed case and imaging selection with Dr. Allegra LaiVanga given the patient's previous history and also history of mesenteric vein thrombosis.  Given the patient's symptoms but normal lipase, will proceed with CT angiography abdomen pelvis to further evaluate.  No cardiac or pulmonary symptoms.  EKG reassuring.  Slightly elevated white count.  Mild but chronic anemia.  Slightly elevated anion gap, will add lactic acid in the event this could represent ischemic type pain or vascular etiology.  Morphine and Phenergan for pain control which the patient reports has been helpful in the past,  Differential diagnosis includes but is not limited to, abdominal perforation, aortic dissection, cholecystitis, appendicitis, diverticulitis, colitis, esophagitis/gastritis, kidney stone, pyelonephritis, urinary tract infection, aortic aneurysm. All are considered in decision and treatment plan. Based upon the patient's presentation and risk factors, proceed with imaging studies  Prior hysterectomy  Clinical Course as of Jul 21 2138  Tue Jul 21, 2018  1809 Patient continues to have ongoing abdominal pain and discomfort, reports some relief with morphine.  No vomiting.  She is requesting additional pain medicine.  I have paged GI to discuss given her previous history, will provide morphine and also p.o. challenge at this point.   [MQ]    Clinical Course User Index [MQ] Sharyn CreamerQuale, Einar Nolasco, MD   Patient admitted to medical service for intractable abdominal pain nausea and vomiting unable to tolerate by mouth after antiemetics and multiple pain medications.  Patient agreement understand plan for admission, I did discuss the case with Dr. Allegra LaiVanga given the  patient's complex history including thrombosis and pancreatitis, she recommends that this could be other cause such as peptic ulcer disease.  We will also provide PPI.  Patient stable for admission to the hospital for further work-up, because of abdominal pain not yet clear but may require GI consultation if not improving  Case discussed with Dr. Anne HahnWillis  ____________________________________________   FINAL CLINICAL IMPRESSION(S) / ED DIAGNOSES  Final diagnoses:  Intractable generalized abdominal pain        Note:  This document was prepared using Dragon voice recognition software and may include unintentional dictation errors       Sharyn CreamerQuale, Yarielis Funaro, MD 07/21/18 2140

## 2018-07-21 NOTE — ED Triage Notes (Signed)
Pt reports hx of pancreatitis and states for the past 2 days she has started to have the same pain and thinks her pancreas is flaring up.

## 2018-07-21 NOTE — ED Notes (Signed)
Pt given water at this time 

## 2018-07-21 NOTE — ED Notes (Signed)
ED TO INPATIENT HANDOFF REPORT  ED Nurse Name Sam and Phone #: [phone]  S Name/Age/Gender Terri Berry 43 y.o. female Room/Bed: ED15A/ED15A  Code Status   Code Status: Prior  Home/SNF/Other Home Patient oriented to: self, place, time and situation Is this baseline? Yes   Triage Complete: Triage complete  Chief Complaint abd pain ems  Triage Note Pt reports hx of pancreatitis and states for the past 2 days she has started to have the same pain and thinks her pancreas is flaring up.    Allergies Allergies  Allergen Reactions  . Precedex [Dexmedetomidine Hcl In Nacl] Other (See Comments)    Significant Bradycardia  . Aspirin     Not indicated due to other anticoagulant use.  Marland Kitchen. Cefpodoxime     Tolerates augmentin and Keflex   . Clarithromycin   . Doxycycline   . Ferrous Gluconate   . Lactose Intolerance (Gi)   . Levofloxacin   . Rizatriptan   . Sulfa Antibiotics   . Topiramate   . Venlafaxine   . Zofran [Ondansetron Hcl]     Level of Care/Admitting Diagnosis ED Disposition    ED Disposition Condition Comment   Admit  Hospital Area: [facility] [[id number]]  Level of Care: Med-Surg [16]  Covid Evaluation: Confirmed COVID Negative  Diagnosis: Intractable nausea and vomiting [720114]  Admitting Physician: Oralia ManisWILLIS, DAVID [[id number]]  Attending Physician: Oralia ManisWILLIS, DAVID [[id number]]  PT Class (Do Not Modify): Observation [104]  PT Acc Code (Do Not Modify): Observation [10022]       B Medical/Surgery History Past Medical History:  Diagnosis Date  . Allergy   . Anxiety   . Depression   . GERD (gastroesophageal reflux disease)   . Hypertension   . IBS (irritable bowel syndrome)   . Pancreatitis    Past Surgical History:  Procedure Laterality Date  . ABDOMINAL HYSTERECTOMY    . GALLBLADDER SURGERY       A IV Location/Drains/Wounds Patient Lines/Drains/Airways Status   Active Line/Drains/Airways    Name:   Placement date:   Placement time:    Site:   Days:   Peripheral IV 07/21/18 Left   07/21/18    1435    -   less than 1   Wound / Incision (Open or Dehisced) 05/25/18 Non-pressure wound Toe (Comment  which one) Anterior;Right;Medial abrasion with drainage   05/25/18    2301    Toe (Comment  which one)   57          Intake/Output Last 24 hours  Intake/Output Summary (Last 24 hours) at 07/21/2018 2235 Last data filed at 07/21/2018 1801 Gross per 24 hour  Intake 500 ml  Output -  Net 500 ml    Labs/Imaging Results for orders placed or performed during the hospital encounter of 07/21/18 (from the past 48 hour(s))  Lipase, blood     Status: None   Collection Time: 07/21/18 12:05 PM  Result Value Ref Range   Lipase 24 11 - 51 U/L    Comment: Performed at [facility], [address] [place], [place] [address]  Comprehensive metabolic panel     Status: Abnormal   Collection Time: 07/21/18 12:05 PM  Result Value Ref Range   Sodium 139 135 - 145 mmol/L   Potassium 3.4 (L) 3.5 - 5.1 mmol/L   Chloride 102 98 - 111 mmol/L   CO2 22 22 - 32 mmol/L   Glucose, Bld 177 (H) 70 - 99 mg/dL   BUN 11  6 - 20 mg/dL   Creatinine, Ser 1.610.95 0.44 - 1.00 mg/dL   Calcium 9.5 8.9 - 09.610.3 mg/dL   Total Protein 7.9 6.5 - 8.1 g/dL   Albumin 4.0 3.5 - 5.0 g/dL   AST 26 15 - 41 U/L   ALT 22 0 - 44 U/L   Alkaline Phosphatase 54 38 - 126 U/L   Total Bilirubin 0.7 0.3 - 1.2 mg/dL   GFR calc non Af Amer >60 >60 mL/min   GFR calc Af Amer >60 >60 mL/min   Anion gap 15 5 - 15    Comment: Performed at [facility], [address] [place], [place] [address]  CBC     Status: Abnormal   Collection Time: 07/21/18 12:05 PM  Result Value Ref Range   WBC 10.6 (H) 4.0 - 10.5 K/uL   RBC 4.67 3.87 - 5.11 MIL/uL   Hemoglobin 10.1 (L) 12.0 - 15.0 g/dL   HCT 98.133.4 (L) 19.136.0 - 47.846.0 %   MCV 71.5 (L) 80.0 - 100.0 fL   MCH 21.6 (L) 26.0 - 34.0 pg   MCHC 30.2 30.0 - 36.0 g/dL   RDW 29.516.9 (H) 62.111.5 - 30.815.5 %   Platelets 335 150 - 400 K/uL    nRBC 0.0 0.0 - 0.2 %    Comment: Performed at [facility], [address] [place], [place] [address]  Lactic acid, plasma     Status: None   Collection Time: 07/21/18  3:07 PM  Result Value Ref Range   Lactic Acid, Venous 1.2 0.5 - 1.9 mmol/L    Comment: Performed at [facility], [address] [place], [place] [address]  Urinalysis, Complete w Microscopic     Status: Abnormal   Collection Time: 07/21/18  4:25 PM  Result Value Ref Range   Color, Urine AMBER (A) YELLOW    Comment: BIOCHEMICALS MAY BE AFFECTED BY COLOR   APPearance CLOUDY (A) CLEAR   Specific Gravity, Urine 1.024 1.005 - 1.030   pH 6.0 5.0 - 8.0   Glucose, UA NEGATIVE NEGATIVE mg/dL   Hgb urine dipstick NEGATIVE NEGATIVE   Bilirubin Urine NEGATIVE NEGATIVE   Ketones, ur 5 (A) NEGATIVE mg/dL   Protein, ur 30 (A) NEGATIVE mg/dL   Nitrite NEGATIVE NEGATIVE   Leukocytes,Ua NEGATIVE NEGATIVE   RBC / HPF 0-5 0 - 5 RBC/hpf   WBC, UA 0-5 0 - 5 WBC/hpf   Bacteria, UA FEW (A) NONE SEEN   Squamous Epithelial / LPF >50 (H) 0 - 5   Mucus PRESENT    Hyaline Casts, UA PRESENT     Comment: Performed at [facility], [address] [place], [place] [address]  SARS Coronavirus 2 (CEPHEID - Performed in [facility] hospital lab), Hosp Order     Status: None   Collection Time: 07/21/18  8:32 PM   Specimen: Nasopharyngeal Swab  Result Value Ref Range   SARS Coronavirus 2 NEGATIVE NEGATIVE    Comment: (NOTE) If result is NEGATIVE SARS-CoV-2 target nucleic acids are NOT DETECTED. The SARS-CoV-2 RNA is generally detectable in upper and lower  respiratory specimens during the acute phase of infection. The lowest  concentration of SARS-CoV-2 viral copies this assay can detect is 250  copies / mL. A negative result does not preclude SARS-CoV-2 infection  and should not be used as the sole basis for treatment or other  patient management decisions.  A negative result may occur with  improper specimen  collection / handling,  submission of specimen other  than nasopharyngeal swab, presence of viral mutation(s) within the  areas targeted by this assay, and inadequate number of viral copies  (<250 copies / mL). A negative result must be combined with clinical  observations, patient history, and epidemiological information. If result is POSITIVE SARS-CoV-2 target nucleic acids are DETECTED. The SARS-CoV-2 RNA is generally detectable in upper and lower  respiratory specimens dur ing the acute phase of infection.  Positive  results are indicative of active infection with SARS-CoV-2.  Clinical  correlation with patient history and other diagnostic information is  necessary to determine patient infection status.  Positive results do  not rule out bacterial infection or co-infection with other viruses. If result is PRESUMPTIVE POSTIVE SARS-CoV-2 nucleic acids MAY BE PRESENT.   A presumptive positive result was obtained on the submitted specimen  and confirmed on repeat testing.  While 2019 novel coronavirus  (SARS-CoV-2) nucleic acids may be present in the submitted sample  additional confirmatory testing may be necessary for epidemiological  and / or clinical management purposes  to differentiate between  SARS-CoV-2 and other Sarbecovirus currently known to infect humans.  If clinically indicated additional testing with an alternate test  methodology [phone]([id number]) is advised. The SARS-CoV-2 RNA is generally  detectable in upper and lower respiratory sp ecimens during the acute  phase of infection. The expected result is Negative. Fact Sheet for Patients:  BoilerBrush.com.cyhttps://www.fda.gov/media/136312/download Fact Sheet for Healthcare Providers: https://pope.com/https://www.fda.gov/media/136313/download This test is not yet approved or cleared by the [place] FDA and has been authorized for detection and/or diagnosis of SARS-CoV-2 by FDA under an Emergency Use Authorization (EUA).  This EUA will remain in effect  (meaning this test can be used) for the duration of the COVID-19 declaration under Section 564(b)(1) of the Act, 21 U.S.C. section 360bbb-3(b)(1), unless the authorization is terminated or revoked sooner. Performed at [facility], [address] [place], [place] [address]    Ct Angio Abd/pel W And/or Wo Contrast  Result Date: 07/21/2018 CLINICAL DATA:  Acute abdominal pain EXAM: CT ANGIOGRAPHY ABDOMEN AND PELVIS WITH CONTRAST AND WITHOUT CONTRAST TECHNIQUE: Multidetector CT imaging of the abdomen and pelvis was performed using the standard protocol during bolus administration of intravenous contrast. Multiplanar reconstructed images and MIPs were obtained and reviewed to evaluate the vascular anatomy. CONTRAST:  100mL ISOVUE-370 IOPAMIDOL (ISOVUE-370) INJECTION 76% COMPARISON:  04/02/2018 FINDINGS: VASCULAR Aorta: Very minor wall irregularity compatible with minimal intimal thickening/early atherosclerosis. Intact aorta without aneurysm or dissection. No occlusive process. No retroperitoneal hemorrhage or hematoma. Celiac: Widely patent including its branches. SMA: Widely patent including its branches. Replaced right hepatic artery noted off the SMA. Renals: Widely patent renal origins without acute finding. IMA: Remains patent off the distal aorta including its branches Inflow: Pelvic iliac vasculature remain patent. No inflow disease or occlusion. The common, internal, and external iliac arteries are patent. Proximal Outflow: Bilateral common femoral and visualized portions of the superficial and profunda femoral arteries are patent without evidence of aneurysm, dissection, vasculitis or significant stenosis. Veins: No veno-occlusive process. Review of the MIP images confirms the above findings. NON-VASCULAR Lower chest: Bibasilar atelectasis and hypoventilatory changes with mild patchy ground-glass attenuation. Normal heart size. No pericardial pleural effusion. Hepatobiliary: No focal liver  abnormality is seen. Status post cholecystectomy. No biliary dilatation. Pancreas: Unremarkable. No pancreatic ductal dilatation or surrounding inflammatory changes. Spleen: Normal in size without focal abnormality. Adrenals/Urinary Tract: Adrenal glands are unremarkable. Kidneys are normal, without renal calculi, focal lesion, or hydronephrosis. Bladder  is unremarkable. Stomach/Bowel: Stomach is within normal limits. Appendix appears normal. No evidence of bowel wall thickening, distention, or inflammatory changes. Lymphatic: No adenopathy. Mildly prominent inguinal lymph nodes noted bilaterally Reproductive: Remote hysterectomy. Small follicles present on both ovaries. Ovaries are normal in size. No pelvic free fluid or fluid collection. Other: No abdominal wall hernia or abnormality. No abdominopelvic ascites. Musculoskeletal: Degenerative changes of the spine. No acute osseous finding. L4-5 sclerotic degenerative change with vacuum disc phenomenon. No compression fracture. IMPRESSION: VASCULAR No significant or acute vascular finding by CTA. NON-VASCULAR Minor basilar atelectasis/hypoventilatory changes. Remote cholecystectomy and hysterectomy No other acute intra-abdominal or pelvic finding by CT. Electronically Signed   By: Jerilynn Mages.  Shick M.D.   On: 07/21/2018 16:00    Pending Labs Unresulted Labs (From admission, onward)    Start     Ordered   07/21/18 2125  Hemoglobin A1c  Add-on,   AD     07/21/18 2124   Signed and Held  CBC  (enoxaparin (LOVENOX)    CrCl >/= 30 ml/min)  Once,   R    Comments: Baseline for enoxaparin therapy IF NOT ALREADY DRAWN.  Notify MD if PLT < 100 K.    Signed and Held   Signed and Held  Creatinine, serum  (enoxaparin (LOVENOX)    CrCl >/= 30 ml/min)  Once,   R    Comments: Baseline for enoxaparin therapy IF NOT ALREADY DRAWN.    Signed and Held   Signed and Held  Creatinine, serum  (enoxaparin (LOVENOX)    CrCl >/= 30 ml/min)  Weekly,   R    Comments: while on enoxaparin  therapy    Signed and Held   Signed and Held  Basic metabolic panel  Tomorrow morning,   R     Signed and Held   Signed and Held  CBC  Tomorrow morning,   R     Signed and Held          Vitals/Pain Today's Vitals   07/21/18 1945 07/21/18 2029 07/21/18 2045 07/21/18 2100  BP:    (!) 112/59  Pulse: 67  76 78  Resp:    18  Temp:      TempSrc:      SpO2: 96%  94% 95%  Weight:      Height:      PainSc:  8       Isolation Precautions No active isolations  Medications Medications  diphenhydrAMINE (BENADRYL) capsule 25 mg (has no administration in time range)  morphine 4 MG/ML injection 4 mg (4 mg Intravenous Given 07/21/18 1538)  promethazine (PHENERGAN) injection 12.5 mg (12.5 mg Intramuscular Given 07/21/18 1538)  sodium chloride 0.9 % bolus 500 mL (0 mLs Intravenous Stopped 07/21/18 1801)  iopamidol (ISOVUE-370) 76 % injection 100 mL (100 mLs Intravenous Contrast Given 07/21/18 1525)  morphine 4 MG/ML injection 4 mg (4 mg Intravenous Given 07/21/18 1805)  morphine 4 MG/ML injection 4 mg (4 mg Intravenous Given 07/21/18 2003)  promethazine (PHENERGAN) injection 12.5 mg (12.5 mg Intramuscular Given 07/21/18 2002)  pantoprazole (PROTONIX) injection 40 mg (40 mg Intravenous Given 07/21/18 2002)  alum & mag hydroxide-simeth (MAALOX/MYLANTA) 200-200-20 MG/5ML suspension 30 mL (30 mLs Oral Given 07/21/18 2003)  LORazepam (ATIVAN) injection 0.5 mg (0.5 mg Intravenous Given 07/21/18 2029)    Mobility walks Low fall risk   Focused Assessments abdominal   R Recommendations: See Admitting Provider Note  Report given to:   Additional Notes:

## 2018-07-21 NOTE — ED Notes (Signed)
Pt up to toilet 

## 2018-07-21 NOTE — ED Notes (Signed)
Pt stating she still feels nauseated and was unable to drink any water. Will inform MD Quale

## 2018-07-22 DIAGNOSIS — Z833 Family history of diabetes mellitus: Secondary | ICD-10-CM | POA: Diagnosis not present

## 2018-07-22 DIAGNOSIS — K55069 Acute infarction of intestine, part and extent unspecified: Secondary | ICD-10-CM | POA: Diagnosis present

## 2018-07-22 DIAGNOSIS — K3184 Gastroparesis: Secondary | ICD-10-CM | POA: Diagnosis present

## 2018-07-22 DIAGNOSIS — Z825 Family history of asthma and other chronic lower respiratory diseases: Secondary | ICD-10-CM | POA: Diagnosis not present

## 2018-07-22 DIAGNOSIS — K861 Other chronic pancreatitis: Secondary | ICD-10-CM | POA: Diagnosis present

## 2018-07-22 DIAGNOSIS — Z882 Allergy status to sulfonamides status: Secondary | ICD-10-CM | POA: Diagnosis not present

## 2018-07-22 DIAGNOSIS — D519 Vitamin B12 deficiency anemia, unspecified: Secondary | ICD-10-CM | POA: Diagnosis present

## 2018-07-22 DIAGNOSIS — Z888 Allergy status to other drugs, medicaments and biological substances status: Secondary | ICD-10-CM | POA: Diagnosis not present

## 2018-07-22 DIAGNOSIS — Z7901 Long term (current) use of anticoagulants: Secondary | ICD-10-CM | POA: Diagnosis not present

## 2018-07-22 DIAGNOSIS — Z883 Allergy status to other anti-infective agents status: Secondary | ICD-10-CM | POA: Diagnosis not present

## 2018-07-22 DIAGNOSIS — K581 Irritable bowel syndrome with constipation: Secondary | ICD-10-CM | POA: Diagnosis present

## 2018-07-22 DIAGNOSIS — Z841 Family history of disorders of kidney and ureter: Secondary | ICD-10-CM | POA: Diagnosis not present

## 2018-07-22 DIAGNOSIS — F17210 Nicotine dependence, cigarettes, uncomplicated: Secondary | ICD-10-CM | POA: Diagnosis present

## 2018-07-22 DIAGNOSIS — E739 Lactose intolerance, unspecified: Secondary | ICD-10-CM | POA: Diagnosis present

## 2018-07-22 DIAGNOSIS — Z809 Family history of malignant neoplasm, unspecified: Secondary | ICD-10-CM | POA: Diagnosis not present

## 2018-07-22 DIAGNOSIS — Z8249 Family history of ischemic heart disease and other diseases of the circulatory system: Secondary | ICD-10-CM | POA: Diagnosis not present

## 2018-07-22 DIAGNOSIS — I10 Essential (primary) hypertension: Secondary | ICD-10-CM | POA: Diagnosis present

## 2018-07-22 DIAGNOSIS — K219 Gastro-esophageal reflux disease without esophagitis: Secondary | ICD-10-CM | POA: Diagnosis present

## 2018-07-22 DIAGNOSIS — E1143 Type 2 diabetes mellitus with diabetic autonomic (poly)neuropathy: Secondary | ICD-10-CM | POA: Diagnosis present

## 2018-07-22 DIAGNOSIS — R1084 Generalized abdominal pain: Secondary | ICD-10-CM | POA: Diagnosis present

## 2018-07-22 DIAGNOSIS — F419 Anxiety disorder, unspecified: Secondary | ICD-10-CM | POA: Diagnosis present

## 2018-07-22 DIAGNOSIS — Z20828 Contact with and (suspected) exposure to other viral communicable diseases: Secondary | ICD-10-CM | POA: Diagnosis present

## 2018-07-22 DIAGNOSIS — D500 Iron deficiency anemia secondary to blood loss (chronic): Secondary | ICD-10-CM | POA: Diagnosis present

## 2018-07-22 DIAGNOSIS — Z886 Allergy status to analgesic agent status: Secondary | ICD-10-CM | POA: Diagnosis not present

## 2018-07-22 LAB — CBC
HCT: 30.8 % — ABNORMAL LOW (ref 36.0–46.0)
Hemoglobin: 9 g/dL — ABNORMAL LOW (ref 12.0–15.0)
MCH: 21.4 pg — ABNORMAL LOW (ref 26.0–34.0)
MCHC: 29.2 g/dL — ABNORMAL LOW (ref 30.0–36.0)
MCV: 73.2 fL — ABNORMAL LOW (ref 80.0–100.0)
Platelets: 305 10*3/uL (ref 150–400)
RBC: 4.21 MIL/uL (ref 3.87–5.11)
RDW: 17.2 % — ABNORMAL HIGH (ref 11.5–15.5)
WBC: 9.4 10*3/uL (ref 4.0–10.5)
nRBC: 0 % (ref 0.0–0.2)

## 2018-07-22 LAB — BASIC METABOLIC PANEL
Anion gap: 11 (ref 5–15)
BUN: 12 mg/dL (ref 6–20)
CO2: 25 mmol/L (ref 22–32)
Calcium: 8.9 mg/dL (ref 8.9–10.3)
Chloride: 102 mmol/L (ref 98–111)
Creatinine, Ser: 0.91 mg/dL (ref 0.44–1.00)
GFR calc Af Amer: >60 mL/min (ref >60)
GFR calc non Af Amer: >60 mL/min (ref >60)
Glucose, Bld: 98 mg/dL (ref 70–99)
Potassium: 3.1 mmol/L — ABNORMAL LOW (ref 3.5–5.1)
Sodium: 138 mmol/L (ref 135–145)

## 2018-07-22 LAB — HEMOGLOBIN A1C
Hgb A1c MFr Bld: 7.7 % — ABNORMAL HIGH (ref 4.8–5.6)
Mean Plasma Glucose: 174.29 mg/dL

## 2018-07-22 LAB — GLUCOSE, CAPILLARY
Glucose-Capillary: 110 mg/dL — ABNORMAL HIGH (ref 70–99)
Glucose-Capillary: 113 mg/dL — ABNORMAL HIGH (ref 70–99)
Glucose-Capillary: 85 mg/dL (ref 70–99)
Glucose-Capillary: 100 mg/dL — ABNORMAL HIGH (ref 70–99)

## 2018-07-22 MED ORDER — HYDROXYZINE HCL 25 MG PO TABS: 25.0000 mg | ORAL_TABLET | Freq: Four times a day (QID) | ORAL | Status: DC | PRN

## 2018-07-22 MED ORDER — METOCLOPRAMIDE HCL 5 MG/ML IJ SOLN
10.0000 mg | Freq: Four times a day (QID) | INTRAMUSCULAR | Status: DC
  Administered 2018-07-22 – 2018-07-25 (×12): 10 mg via INTRAVENOUS
  Filled 2018-07-22 (×12): qty 2

## 2018-07-22 MED ORDER — PROMETHAZINE HCL 25 MG/ML IJ SOLN
12.5000 mg | Freq: Four times a day (QID) | INTRAMUSCULAR | Status: DC | PRN
  Administered 2018-07-23 – 2018-07-24 (×4): 12.5 mg via INTRAVENOUS
  Filled 2018-07-22 (×4): qty 1

## 2018-07-22 MED ORDER — POTASSIUM CHLORIDE CRYS ER 20 MEQ PO TBCR
40.0000 meq | EXTENDED_RELEASE_TABLET | Freq: Once | ORAL | Status: AC
  Administered 2018-07-22: 13:00:00 40 meq via ORAL
  Filled 2018-07-22: qty 2

## 2018-07-22 NOTE — Progress Notes (Signed)
Terri Berry at [place] NAME: Terri Berry    MR#:  [id number]  DATE OF BIRTH:  [birth year]  SUBJECTIVE:  CHIEF COMPLAINT:   Chief Complaint  Patient presents with  . Abdominal Pain   Has epigastric and right-sided abdominal pain.  Continues to have nausea but no vomiting today.   REVIEW OF SYSTEMS:    Review of Systems  Constitutional: Positive for malaise/fatigue. Negative for chills and fever.  HENT: Negative for sore throat.   Eyes: Negative for blurred vision, double vision and pain.  Respiratory: Negative for cough, hemoptysis, shortness of breath and wheezing.   Cardiovascular: Negative for chest pain, palpitations, orthopnea and leg swelling.  Gastrointestinal: Positive for abdominal pain and nausea. Negative for constipation, diarrhea, heartburn and vomiting.  Genitourinary: Negative for dysuria and hematuria.  Musculoskeletal: Negative for back pain and joint pain.  Skin: Negative for rash.  Neurological: Negative for sensory change, speech change, focal weakness and headaches.  Endo/Heme/Allergies: Does not bruise/bleed easily.  Psychiatric/Behavioral: Negative for depression. The patient is not nervous/anxious.     DRUG ALLERGIES:   Allergies  Allergen Reactions  . Precedex [Dexmedetomidine Hcl In Nacl] Other (See Comments)    Significant Bradycardia  . Aspirin     Not indicated due to other anticoagulant use.  Marland Kitchen Cefpodoxime     Tolerates augmentin and Keflex   . Clarithromycin   . Doxycycline   . Ferrous Gluconate   . Lactose Intolerance (Gi)   . Levofloxacin   . Rizatriptan   . Sulfa Antibiotics   . Topiramate   . Venlafaxine   . Zofran [Ondansetron Hcl]     VITALS:  Blood pressure 115/84, pulse 76, temperature (!) 97.5 F (36.4 C), temperature source Oral, resp. rate 18, height 5\' 7"  (1.702 m), weight 88.8 kg, SpO2 98 %.  PHYSICAL EXAMINATION:   Physical Exam  GENERAL:  43 y.o.-year-old patient lying  in the bed with no acute distress.  EYES: Pupils equal, round, reactive to light and accommodation. No scleral icterus. Extraocular muscles intact.  HEENT: Head atraumatic, normocephalic. Oropharynx and nasopharynx clear.  NECK:  Supple, no jugular venous distention. No thyroid enlargement, no tenderness.  LUNGS: Normal breath sounds bilaterally, no wheezing, rales, rhonchi. No use of accessory muscles of respiration.  CARDIOVASCULAR: S1, S2 normal. No murmurs, rubs, or gallops.  ABDOMEN: Soft, tenderness in epigastric and right upper quadrant area, nondistended. Bowel sounds present. No organomegaly or mass.  EXTREMITIES: No cyanosis, clubbing or edema b/l.    NEUROLOGIC: Cranial nerves II through XII are intact. No focal Motor or sensory deficits b/l.   PSYCHIATRIC: The patient is alert and oriented x 3.  SKIN: No obvious rash, lesion, or ulcer.   LABORATORY PANEL:   CBC Recent Labs  Lab 07/22/18 0247  WBC 9.4  HGB 9.0*  HCT 30.8*  PLT 305   ------------------------------------------------------------------------------------------------------------------ Chemistries  Recent Labs  Lab 07/21/18 1205 07/22/18 0247  NA 139 138  K 3.4* 3.1*  CL 102 102  CO2 22 25  GLUCOSE 177* 98  BUN 11 12  CREATININE 0.95 0.91  CALCIUM 9.5 8.9  AST 26  --   ALT 22  --   ALKPHOS 54  --   BILITOT 0.7  --    ------------------------------------------------------------------------------------------------------------------  Cardiac Enzymes No results for input(s): TROPONINI in the last 168 hours. ------------------------------------------------------------------------------------------------------------------  RADIOLOGY:  Ct Angio Abd/pel W And/or Wo Contrast  Result Date: 07/21/2018 CLINICAL DATA:  Acute abdominal pain EXAM:  CT ANGIOGRAPHY ABDOMEN AND PELVIS WITH CONTRAST AND WITHOUT CONTRAST TECHNIQUE: Multidetector CT imaging of the abdomen and pelvis was performed using the standard  protocol during bolus administration of intravenous contrast. Multiplanar reconstructed images and MIPs were obtained and reviewed to evaluate the vascular anatomy. CONTRAST:  100mL ISOVUE-370 IOPAMIDOL (ISOVUE-370) INJECTION 76% COMPARISON:  04/02/2018 FINDINGS: VASCULAR Aorta: Very minor wall irregularity compatible with minimal intimal thickening/early atherosclerosis. Intact aorta without aneurysm or dissection. No occlusive process. No retroperitoneal hemorrhage or hematoma. Celiac: Widely patent including its branches. SMA: Widely patent including its branches. Replaced right hepatic artery noted off the SMA. Renals: Widely patent renal origins without acute finding. IMA: Remains patent off the distal aorta including its branches Inflow: Pelvic iliac vasculature remain patent. No inflow disease or occlusion. The common, internal, and external iliac arteries are patent. Proximal Outflow: Bilateral common femoral and visualized portions of the superficial and profunda femoral arteries are patent without evidence of aneurysm, dissection, vasculitis or significant stenosis. Veins: No veno-occlusive process. Review of the MIP images confirms the above findings. NON-VASCULAR Lower chest: Bibasilar atelectasis and hypoventilatory changes with mild patchy ground-glass attenuation. Normal heart size. No pericardial pleural effusion. Hepatobiliary: No focal liver abnormality is seen. Status post cholecystectomy. No biliary dilatation. Pancreas: Unremarkable. No pancreatic ductal dilatation or surrounding inflammatory changes. Spleen: Normal in size without focal abnormality. Adrenals/Urinary Tract: Adrenal glands are unremarkable. Kidneys are normal, without renal calculi, focal lesion, or hydronephrosis. Bladder is unremarkable. Stomach/Bowel: Stomach is within normal limits. Appendix appears normal. No evidence of bowel wall thickening, distention, or inflammatory changes. Lymphatic: No adenopathy. Mildly prominent  inguinal lymph nodes noted bilaterally Reproductive: Remote hysterectomy. Small follicles present on both ovaries. Ovaries are normal in size. No pelvic free fluid or fluid collection. Other: No abdominal wall hernia or abnormality. No abdominopelvic ascites. Musculoskeletal: Degenerative changes of the spine. No acute osseous finding. L4-5 sclerotic degenerative change with vacuum disc phenomenon. No compression fracture. IMPRESSION: VASCULAR No significant or acute vascular finding by CTA. NON-VASCULAR Minor basilar atelectasis/hypoventilatory changes. Remote cholecystectomy and hysterectomy No other acute intra-abdominal or pelvic finding by CT. Electronically Signed   By: Judie PetitM.  Shick M.D.   On: 07/21/2018 16:00     ASSESSMENT AND PLAN:   *Intractable nausea and vomiting.  Differential had pancreatitis but lipase is normal and CT shows nothing acute.  Suspect possible gastroparesis.  Will start scheduled Reglan.  PRN Compazine.  Pain medications as needed.  *Diabetes mellitus.  Patient was on medications in the past for a year and then stopped as her blood sugars were normal.  At this time her hemoglobin A1c is 7.7.  Will start metformin at the time of discharge.  Presently on sliding scale insulin.  *Hypertension.  Continue home medications.  *GERD.  On PPI  All the records are reviewed and case discussed with Care Management/Social Worker Management plans discussed with the patient, family and they are in agreement.  CODE STATUS: Full code  DVT Prophylaxis: SCDs  TOTAL TIME TAKING CARE OF THIS PATIENT: 35 minutes.   POSSIBLE D/C IN 1-2 DAYS, DEPENDING ON CLINICAL CONDITION.  Orie FishermanSrikar R Chaquana Nichols M.D on 07/22/2018 at 12:05 PM  Between 7am to 6pm - Pager - [phone]  After 6pm go to [email or web address] - password [email or web address] [facility]  [facility] [place] [facility]  Office  [phone]  CC: Primary care physician; Thomes DinningWeeks, Cynthia, MD  Note: This dictation was prepared with Dragon dictation along with  smaller phrase technology. Any transcriptional errors that result from this process are unintentional.

## 2018-07-23 DIAGNOSIS — R112 Nausea with vomiting, unspecified: Secondary | ICD-10-CM

## 2018-07-23 DIAGNOSIS — R1013 Epigastric pain: Secondary | ICD-10-CM

## 2018-07-23 LAB — GLUCOSE, CAPILLARY
Glucose-Capillary: 115 mg/dL — ABNORMAL HIGH (ref 70–99)
Glucose-Capillary: 181 mg/dL — ABNORMAL HIGH (ref 70–99)
Glucose-Capillary: 83 mg/dL (ref 70–99)
Glucose-Capillary: 88 mg/dL (ref 70–99)

## 2018-07-23 LAB — VITAMIN B12: Vitamin B-12: >7500 pg/mL — ABNORMAL HIGH (ref 180–914)

## 2018-07-23 LAB — IRON AND TIBC
Iron: 22 ug/dL — ABNORMAL LOW (ref 28–170)
Saturation Ratios: 4 % — ABNORMAL LOW (ref 10.4–31.8)
TIBC: 532 ug/dL — ABNORMAL HIGH (ref 250–450)
UIBC: 510 ug/dL

## 2018-07-23 LAB — FOLATE: Folate: 17 ng/mL (ref >5.9)

## 2018-07-23 LAB — FERRITIN: Ferritin: 3 ng/mL — ABNORMAL LOW (ref 11–307)

## 2018-07-23 MED ORDER — SODIUM CHLORIDE 0.9 % IV SOLN
300.0000 mg | INTRAVENOUS | Status: DC
  Administered 2018-07-23: 300 mg via INTRAVENOUS
  Filled 2018-07-23 (×5): qty 15

## 2018-07-23 MED ORDER — PANTOPRAZOLE SODIUM 40 MG PO TBEC
40.0000 mg | DELAYED_RELEASE_TABLET | Freq: Two times a day (BID) | ORAL | Status: DC
  Administered 2018-07-23 – 2018-07-25 (×4): 40 mg via ORAL
  Filled 2018-07-23 (×3): qty 1

## 2018-07-23 MED ORDER — SODIUM CHLORIDE 0.9 % IV SOLN
INTRAVENOUS | Status: DC
  Administered 2018-07-24: 13:00:00 via INTRAVENOUS

## 2018-07-23 MED ORDER — PEG 3350-KCL-NA BICARB-NACL 420 G PO SOLR
4000.0000 mL | Freq: Once | ORAL | Status: DC
  Filled 2018-07-23: qty 4000

## 2018-07-23 MED ORDER — POTASSIUM CHLORIDE CRYS ER 20 MEQ PO TBCR
40.0000 meq | EXTENDED_RELEASE_TABLET | Freq: Once | ORAL | Status: AC
  Administered 2018-07-23: 11:00:00 40 meq via ORAL
  Filled 2018-07-23: qty 2

## 2018-07-23 MED ORDER — CYANOCOBALAMIN 1000 MCG/ML IJ SOLN
1000.0000 ug | Freq: Every day | INTRAMUSCULAR | Status: DC
  Administered 2018-07-23 – 2018-07-24 (×2): 1000 ug via SUBCUTANEOUS
  Filled 2018-07-23 (×3): qty 1

## 2018-07-23 MED ORDER — MAGNESIUM CITRATE PO SOLN
1.0000 | Freq: Once | ORAL | Status: AC
  Administered 2018-07-23: 1 via ORAL
  Filled 2018-07-23: qty 296

## 2018-07-23 MED ORDER — SODIUM CHLORIDE 0.9 % IV SOLN
300.0000 mg | Freq: Once | INTRAVENOUS | Status: DC
  Filled 2018-07-23: qty 15

## 2018-07-23 MED ORDER — POLYETHYLENE GLYCOL 3350 17 GM/SCOOP PO POWD
1.0000 | Freq: Once | ORAL | Status: AC
  Administered 2018-07-23: 255 g via ORAL
  Filled 2018-07-23: qty 255

## 2018-07-23 NOTE — Plan of Care (Signed)
Patient is tolerating clear liquid diet and asked for saltine crackers.  Ambulating independently.  Will continue to monitor.  Christene Slates  07/23/2018  3:33 AM

## 2018-07-23 NOTE — Progress Notes (Signed)
[facility] [place] NAME: Terri Berry    MR#:  [id number]  DATE OF BIRTH:  May 05, 1975  SUBJECTIVE:  CHIEF COMPLAINT:   Chief Complaint  Patient presents with  . Abdominal Pain   Continues to complain of epigastric pain and significant nausea.   REVIEW OF SYSTEMS:    Review of Systems  Constitutional: Positive for malaise/fatigue. Negative for chills and fever.  HENT: Negative for sore throat.   Eyes: Negative for blurred vision, double vision and pain.  Respiratory: Negative for cough, hemoptysis, shortness of breath and wheezing.   Cardiovascular: Negative for chest pain, palpitations, orthopnea and leg swelling.  Gastrointestinal: Positive for abdominal pain and nausea. Negative for constipation, diarrhea, heartburn and vomiting.  Genitourinary: Negative for dysuria and hematuria.  Musculoskeletal: Negative for back pain and joint pain.  Skin: Negative for rash.  Neurological: Negative for sensory change, speech change, focal weakness and headaches.  Endo/Heme/Allergies: Does not bruise/bleed easily.  Psychiatric/Behavioral: Negative for depression. The patient is not nervous/anxious.     DRUG ALLERGIES:   Allergies  Allergen Reactions  . Precedex [Dexmedetomidine Hcl In Nacl] Other (See Comments)    Significant Bradycardia  . Aspirin     Not indicated due to other anticoagulant use.  Marland Kitchen Cefpodoxime     Tolerates augmentin and Keflex   . Clarithromycin   . Doxycycline   . Ferrous Gluconate   . Lactose Intolerance (Gi)   . Levofloxacin   . Rizatriptan   . Sulfa Antibiotics   . Topiramate   . Venlafaxine   . Zofran [Ondansetron Hcl]     VITALS:  Blood pressure 113/89, pulse (!) 56, temperature 97.7 F (36.5 C), temperature source Oral, resp. rate 16, height 5\' 7"  (1.702 m), weight 88.8 kg, SpO2 98 %.  PHYSICAL EXAMINATION:   Physical Exam  GENERAL:  43 y.o.-year-old patient lying in the bed with no acute  distress.  EYES: Pupils equal, round, reactive to light and accommodation. No scleral icterus. Extraocular muscles intact.  HEENT: Head atraumatic, normocephalic. Oropharynx and nasopharynx clear.  NECK:  Supple, no jugular venous distention. No thyroid enlargement, no tenderness.  LUNGS: Normal breath sounds bilaterally, no wheezing, rales, rhonchi. No use of accessory muscles of respiration.  CARDIOVASCULAR: S1, S2 normal. No murmurs, rubs, or gallops.  ABDOMEN: Soft, tenderness in epigastric and right upper quadrant area, nondistended. Bowel sounds present. No organomegaly or mass.  EXTREMITIES: No cyanosis, clubbing or edema b/l.    NEUROLOGIC: Cranial nerves II through XII are intact. No focal Motor or sensory deficits b/l.   PSYCHIATRIC: The patient is alert and oriented x 3.  SKIN: No obvious rash, lesion, or ulcer.   LABORATORY PANEL:   CBC Recent Labs  Lab 07/22/18 0247  WBC 9.4  HGB 9.0*  HCT 30.8*  PLT 305   ------------------------------------------------------------------------------------------------------------------ Chemistries  Recent Labs  Lab 07/21/18 1205 07/22/18 0247  NA 139 138  K 3.4* 3.1*  CL 102 102  CO2 22 25  GLUCOSE 177* 98  BUN 11 12  CREATININE 0.95 0.91  CALCIUM 9.5 8.9  AST 26  --   ALT 22  --   ALKPHOS 54  --   BILITOT 0.7  --    ------------------------------------------------------------------------------------------------------------------  Cardiac Enzymes No results for input(s): TROPONINI in the last 168 hours. ------------------------------------------------------------------------------------------------------------------  RADIOLOGY:  Ct Angio Abd/pel W And/or Wo Contrast  Result Date: 07/21/2018 CLINICAL DATA:  Acute abdominal pain EXAM: CT ANGIOGRAPHY ABDOMEN AND PELVIS WITH  CONTRAST AND WITHOUT CONTRAST TECHNIQUE: Multidetector CT imaging of the abdomen and pelvis was performed using the standard protocol during bolus  administration of intravenous contrast. Multiplanar reconstructed images and MIPs were obtained and reviewed to evaluate the vascular anatomy. CONTRAST:  100mL ISOVUE-370 IOPAMIDOL (ISOVUE-370) INJECTION 76% COMPARISON:  04/02/2018 FINDINGS: VASCULAR Aorta: Very minor wall irregularity compatible with minimal intimal thickening/early atherosclerosis. Intact aorta without aneurysm or dissection. No occlusive process. No retroperitoneal hemorrhage or hematoma. Celiac: Widely patent including its branches. SMA: Widely patent including its branches. Replaced right hepatic artery noted off the SMA. Renals: Widely patent renal origins without acute finding. IMA: Remains patent off the distal aorta including its branches Inflow: Pelvic iliac vasculature remain patent. No inflow disease or occlusion. The common, internal, and external iliac arteries are patent. Proximal Outflow: Bilateral common femoral and visualized portions of the superficial and profunda femoral arteries are patent without evidence of aneurysm, dissection, vasculitis or significant stenosis. Veins: No veno-occlusive process. Review of the MIP images confirms the above findings. NON-VASCULAR Lower chest: Bibasilar atelectasis and hypoventilatory changes with mild patchy ground-glass attenuation. Normal heart size. No pericardial pleural effusion. Hepatobiliary: No focal liver abnormality is seen. Status post cholecystectomy. No biliary dilatation. Pancreas: Unremarkable. No pancreatic ductal dilatation or surrounding inflammatory changes. Spleen: Normal in size without focal abnormality. Adrenals/Urinary Tract: Adrenal glands are unremarkable. Kidneys are normal, without renal calculi, focal lesion, or hydronephrosis. Bladder is unremarkable. Stomach/Bowel: Stomach is within normal limits. Appendix appears normal. No evidence of bowel wall thickening, distention, or inflammatory changes. Lymphatic: No adenopathy. Mildly prominent inguinal lymph nodes  noted bilaterally Reproductive: Remote hysterectomy. Small follicles present on both ovaries. Ovaries are normal in size. No pelvic free fluid or fluid collection. Other: No abdominal wall hernia or abnormality. No abdominopelvic ascites. Musculoskeletal: Degenerative changes of the spine. No acute osseous finding. L4-5 sclerotic degenerative change with vacuum disc phenomenon. No compression fracture. IMPRESSION: VASCULAR No significant or acute vascular finding by CTA. NON-VASCULAR Minor basilar atelectasis/hypoventilatory changes. Remote cholecystectomy and hysterectomy No other acute intra-abdominal or pelvic finding by CT. Electronically Signed   By: Judie PetitM.  Shick M.D.   On: 07/21/2018 16:00     ASSESSMENT AND PLAN:   *Intractable nausea and vomiting.  Differential had pancreatitis but lipase is normal and CT shows nothing acute.  Started Reglan for possible gastroparesis.  But patient's blood sugars have been close to normal without any medications. Concern regarding gastritis or peptic ulcer disease. Will consult GI. Haiku msg sent to Dr. Allegra LaiVanga.  *Diabetes mellitus.  Patient was on medications in the past for a year and then stopped as her blood sugars were normal.  At this time her hemoglobin A1c is 7.7.  Will start metformin at the time of discharge.  Presently on sliding scale insulin.  *Hypertension.  Continue home medications.  *GERD. On PPI  All the records are reviewed and case discussed with Care Management/Social Worker Management plans discussed with the patient, family and they are in agreement.  CODE STATUS: Full code  DVT Prophylaxis: SCDs  TOTAL TIME TAKING CARE OF THIS PATIENT: 35 minutes.   POSSIBLE D/C IN 1-2 DAYS, DEPENDING ON CLINICAL CONDITION.  Orie FishermanSrikar R Nehan Flaum M.D on 07/23/2018 at 10:55 AM  Between 7am to 6pm - Pager - [phone]  After 6pm go to [email or web address] - password [email or web address] [facility]  [facility] [place]  Office  [phone]  CC: Primary care  physician; Thomes DinningWeeks, Cynthia, MD  Note: This dictation was prepared with Dragon dictation along with smaller phrase  technology. Any transcriptional errors that result from this process are unintentional.

## 2018-07-23 NOTE — Consult Note (Signed)
Terri Darby, MD [address]  [place]  [place], [place] [address]  Main: [phone]  Fax: [phone] Pager: [phone]   Consultation  Referring Provider:     No ref. provider found Primary Care Physician:  Joanie Coddington, MD Primary Gastroenterologist:  Dr. Sherri Sear         Reason for Consultation:     Epigastric pain, nausea and vomiting  Date of Admission:  07/21/2018 Date of Consultation:  07/23/2018         HPI:   Sandeep Delagarza is a 43 y.o. female with metabolic syndrome, known history of acute pancreatitis secondary to hypertriglyceridemia, well-known to GI service from prior admissions for acute pancreatitis now admitted with 2 days history of acute epigastric, right upper quadrant pain associated with intractable nausea and nonbloody emesis.  Patient denies drinking alcohol, she reports taking insulin and fenofibrate.  Her triglycerides are fairly under control and her hemoglobin A1c is 7.7.  She underwent CT angios abdomen and pelvis which did not reveal any evidence of acute intra-abdominal pathology, no evidence of pancreatic fluid collections from prior pancreatitis episodes.  Her lipase is normal, LFTs are normal.  She is started on Reglan, Protonix 20 mg twice daily.  Patient received Norco, when interviewed her, she was snoring, but arousable and able to answer simple questions.  She denied severe abdominal pain, reports feeling nauseous only she is able to keep food down today  She also has chronic severe iron deficiency and B12 deficiency anemia Had a colonoscopy at [facility] in 2009, report not available   NSAIDs: None  Antiplts/Anticoagulants/Anti thrombotics: Eliquis, unclear indication  GI Procedures: Colonoscopy at [facility] in 2009  Past Medical History:  Diagnosis Date   Allergy    Anxiety    Depression    GERD (gastroesophageal reflux disease)    Hypertension    IBS (irritable bowel syndrome)    Pancreatitis     Past Surgical  History:  Procedure Laterality Date   ABDOMINAL HYSTERECTOMY     GALLBLADDER SURGERY      Prior to Admission medications   Medication Sig Start Date End Date Taking? Authorizing Provider  albuterol (PROVENTIL HFA;VENTOLIN HFA) 108 (90 Base) MCG/ACT inhaler Inhale 2 puffs into the lungs every 6 (six) hours as needed for wheezing or shortness of breath. 02/15/18  Yes Demetrios Loll, MD  apixaban (ELIQUIS) 5 MG TABS tablet Take 5 mg by mouth every 12 (twelve) hours. 04/20/18 07/21/18 Yes [provider]  atorvastatin (LIPITOR) 40 MG tablet Take 40 mg by mouth daily. 05/07/18 11/03/18 Yes [provider]  busPIRone (BUSPAR) 10 MG tablet Take 10 mg by mouth 3 (three) times daily. 04/20/18 11/03/18 Yes [provider]  cetirizine (ZYRTEC) 10 MG tablet Take 10 mg by mouth daily.   Yes [provider]  clonazePAM (KLONOPIN) 1 MG tablet Take 1 mg by mouth 3 (three) times daily. 05/07/18 08/05/18 Yes [provider]  dicyclomine (BENTYL) 10 MG capsule Take 10 mg by mouth 3 (three) times daily as needed for spasms. 05/11/18 05/11/19 Yes [provider]  fenofibrate 160 MG tablet Take 160 mg by mouth daily. 05/07/18 11/03/18 Yes [provider]  FLUoxetine (PROZAC) 20 MG capsule Take 60 mg by mouth daily. 06/12/18 12/09/18 Yes [provider]  hydrocortisone (ANUSOL-HC) 25 MG suppository Place 25 mg rectally 2 (two) times a day. 06/12/18  Yes [provider]  Melatonin 3 MG TABS Take 6 mg by mouth at bedtime.  Yes [provider]  mirtazapine (REMERON) 30 MG tablet Take 30 mg by mouth at bedtime. 05/19/18  Yes [provider]  omeprazole (PRILOSEC) 20 MG capsule Take 40 mg by mouth 2 (two) times daily. 05/07/18  Yes [provider]  Pancrelipase, Lip-Prot-Amyl, 24000-76000 units CPEP Take 2 capsules by mouth 3 (three) times daily with meals.   Yes [provider]  pregabalin (LYRICA) 200 MG capsule Take 200 mg by mouth  3 (three) times daily. 05/07/18  Yes [provider]  prochlorperazine (COMPAZINE) 10 MG tablet Take 10 mg by mouth daily. 04/20/18  Yes [provider]  promethazine (PHENERGAN) 25 MG tablet Take 25 mg by mouth at bedtime as needed for nausea. 04/22/18  Yes [provider]  senna (SENOKOT) 8.6 MG TABS tablet Take 4 tablets by mouth daily.   Yes [provider]  tiZANidine (ZANAFLEX) 4 MG tablet Take 4 mg by mouth 2 (two) times daily as needed for muscle spasms. 05/22/18 05/22/19 Yes [provider]  Vitamin D, Ergocalciferol, (DRISDOL) 1.25 MG (50000 UT) CAPS capsule Take 50,000 Units by mouth once a week. 04/20/18 07/24/18 Yes [provider]   Current Facility-Administered Medications:    acetaminophen (TYLENOL) tablet 650 mg, 650 mg, Oral, Q6H PRN, 650 mg at 07/23/18 1037 **OR** acetaminophen (TYLENOL) suppository 650 mg, 650 mg, Rectal, Q6H PRN, Lance Coon, MD   atorvastatin (LIPITOR) tablet 40 mg, 40 mg, Oral, Daily, Lance Coon, MD, 40 mg at 07/22/18 1732   clonazePAM (KLONOPIN) tablet 1 mg, 1 mg, Oral, TID, Lance Coon, MD, 1 mg at 07/23/18 1037   cyanocobalamin ((VITAMIN B-12)) injection 1,000 mcg, 1,000 mcg, Subcutaneous, Daily, Sherria Riemann, Tally Due, MD   FLUoxetine (PROZAC) capsule 60 mg, 60 mg, Oral, Daily, Lance Coon, MD, 60 mg at 07/23/18 1038   hydrOXYzine (ATARAX/VISTARIL) tablet 25 mg, 25 mg, Oral, Q6H PRN, Sudini, Srikar, MD   insulin aspart (novoLOG) injection 0-9 Units, 0-9 Units, Subcutaneous, TID WC, Lance Coon, MD   iron sucrose (VENOFER) 300 mg in sodium chloride 0.9 % 250 mL IVPB, 300 mg, Intravenous, Q24H, Nikolis Berent, Tally Due, MD   lipase/protease/amylase (CREON) capsule 24,000 Units, 24,000 Units, Oral, TID WC, Lance Coon, MD, 24,000 Units at 07/23/18 1038   metoCLOPramide (REGLAN) injection 10 mg, 10 mg, Intravenous, Q6H, Sudini, Srikar, MD, 10 mg at 07/23/18 0622   pantoprazole (PROTONIX) EC tablet  40 mg, 40 mg, Oral, BID AC, Landyn Buckalew, Tally Due, MD   pregabalin (LYRICA) capsule 200 mg, 200 mg, Oral, TID, Lance Coon, MD, 200 mg at 07/23/18 1036   promethazine (PHENERGAN) injection 12.5 mg, 12.5 mg, Intravenous, Q6H PRN, Sudini, Srikar, MD, 12.5 mg at 07/23/18 1033   traMADol (ULTRAM) tablet 50 mg, 50 mg, Oral, Q6H PRN, Lance Coon, MD, 50 mg at 07/23/18 6712   Family History  Problem Relation Age of Onset   Diabetes Father    Heart disease Father    Kidney disease Father    COPD Father    Cancer Paternal Aunt    Diabetes Paternal Uncle    Heart disease Paternal Uncle      Social History   Tobacco Use   Smoking status: Current Every Day Smoker    Packs/day: 0.50    Types: Cigarettes   Smokeless tobacco: Never Used  Substance Use Topics   Alcohol use: Not Currently   Drug use: Not Currently    Allergies as of 07/21/2018 - Review Complete 07/21/2018  Allergen Reaction Noted   Precedex [dexmedetomidine  hcl in nacl] Other (See Comments) 02/09/2018   Aspirin  06/13/2018   Cefpodoxime  10/31/2017   Clarithromycin  10/31/2017   Doxycycline  10/31/2017   Ferrous gluconate  10/31/2017   Lactose intolerance (gi)  12/02/2017   Levofloxacin  10/31/2017   Rizatriptan  10/31/2017   Sulfa antibiotics  10/31/2017   Topiramate  10/31/2017   Venlafaxine  10/31/2017   Zofran [ondansetron hcl]  10/31/2017    Review of Systems:    All systems reviewed and negative except where noted in HPI.   Physical Exam:  Vital signs in last 24 hours: Temp:  [97.7 F (36.5 C)-98.4 F (36.9 C)] 97.8 F (36.6 C) (06/18 1128) Pulse Rate:  [56-69] 69 (06/18 1128) Resp:  [16] 16 (06/18 0431) BP: (113-136)/(87-90) 136/90 (06/18 1128) SpO2:  [86 %-98 %] 97 % (06/18 1128) Last BM Date: 07/21/18 General:   Pleasant, cooperative in NAD, lethargic but arousable Head:  Normocephalic and atraumatic. Eyes:   No icterus.   Conjunctiva pink. PERRLA. Ears:  Normal  auditory acuity. Neck:  Supple; no masses or thyroidomegaly Lungs: Respirations even and unlabored. Lungs clear to auscultation bilaterally.   No wheezes, crackles, or rhonchi.  Heart:  Regular rate and rhythm;  Without murmur, clicks, rubs or gallops Abdomen:  Soft, nondistended, epigastric and right upper quadrant tenderness. Normal bowel sounds. No appreciable masses or hepatomegaly.  No rebound or guarding.  Rectal:  Not performed. Msk:  Symmetrical without gross deformities.  Strength generalized weakness Extremities:  Without edema, cyanosis or clubbing. Neurologic:  Alert and oriented x3;  grossly normal neurologically. Skin:  Intact without significant lesions or rashes. Psych:  Alert and cooperative. Normal affect.  LAB RESULTS: CBC Latest Ref Rng & Units 07/22/2018 07/21/2018 07/03/2018  WBC 4.0 - 10.5 K/uL 9.4 10.6(H) 8.4  Hemoglobin 12.0 - 15.0 g/dL 9.0(L) 10.1(L) 9.2(L)  Hematocrit 36.0 - 46.0 % 30.8(L) 33.4(L) 30.8(L)  Platelets 150 - 400 K/uL 305 335 353    BMET BMP Latest Ref Rng & Units 07/22/2018 07/21/2018 07/03/2018  Glucose 70 - 99 mg/dL 98 177(H) 141(H)  BUN 6 - 20 mg/dL 12 11 22(H)  Creatinine 0.44 - 1.00 mg/dL 0.91 0.95 1.22(H)  BUN/Creat Ratio 9 - 23 - - -  Sodium 135 - 145 mmol/L 138 139 140  Potassium 3.5 - 5.1 mmol/L 3.1(L) 3.4(L) 3.3(L)  Chloride 98 - 111 mmol/L 102 102 107  CO2 22 - 32 mmol/L _0 Calcium 8.9 - 10.3 mg/dL 8.9 9.5 9.1    LFT Hepatic Function Latest Ref Rng & Units 07/21/2018 07/02/2018 05/25/2018  Total Protein 6.5 - 8.1 g/dL 7.9 8.1 7.4  Albumin 3.5 - 5.0 g/dL 4.0 4.2 3.8  AST 15 - 41 U/L _1 ALT 0 - 44 U/L _2 Alk Phosphatase 38 - 126 U/L 54 50 54  Total Bilirubin 0.3 - 1.2 mg/dL 0.7 0.5 <0.1(L)  Bilirubin, Direct 0.0 - 0.2 mg/dL - - -     STUDIES: Ct Angio Abd/pel W And/or Wo Contrast  Result Date: 07/21/2018 CLINICAL DATA:  Acute abdominal pain EXAM: CT ANGIOGRAPHY ABDOMEN AND PELVIS WITH CONTRAST AND WITHOUT  CONTRAST TECHNIQUE: Multidetector CT imaging of the abdomen and pelvis was performed using the standard protocol during bolus administration of intravenous contrast. Multiplanar reconstructed images and MIPs were obtained and reviewed to evaluate the vascular anatomy. CONTRAST:  133m ISOVUE-370 IOPAMIDOL (ISOVUE-370) INJECTION 76% COMPARISON:  04/02/2018 FINDINGS: VASCULAR Aorta: Very minor wall irregularity compatible with  minimal intimal thickening/early atherosclerosis. Intact aorta without aneurysm or dissection. No occlusive process. No retroperitoneal hemorrhage or hematoma. Celiac: Widely patent including its branches. SMA: Widely patent including its branches. Replaced right hepatic artery noted off the SMA. Renals: Widely patent renal origins without acute finding. IMA: Remains patent off the distal aorta including its branches Inflow: Pelvic iliac vasculature remain patent. No inflow disease or occlusion. The common, internal, and external iliac arteries are patent. Proximal Outflow: Bilateral common femoral and visualized portions of the superficial and profunda femoral arteries are patent without evidence of aneurysm, dissection, vasculitis or significant stenosis. Veins: No veno-occlusive process. Review of the MIP images confirms the above findings. NON-VASCULAR Lower chest: Bibasilar atelectasis and hypoventilatory changes with mild patchy ground-glass attenuation. Normal heart size. No pericardial pleural effusion. Hepatobiliary: No focal liver abnormality is seen. Status post cholecystectomy. No biliary dilatation. Pancreas: Unremarkable. No pancreatic ductal dilatation or surrounding inflammatory changes. Spleen: Normal in size without focal abnormality. Adrenals/Urinary Tract: Adrenal glands are unremarkable. Kidneys are normal, without renal calculi, focal lesion, or hydronephrosis. Bladder is unremarkable. Stomach/Bowel: Stomach is within normal limits. Appendix appears normal. No evidence of  bowel wall thickening, distention, or inflammatory changes. Lymphatic: No adenopathy. Mildly prominent inguinal lymph nodes noted bilaterally Reproductive: Remote hysterectomy. Small follicles present on both ovaries. Ovaries are normal in size. No pelvic free fluid or fluid collection. Other: No abdominal wall hernia or abnormality. No abdominopelvic ascites. Musculoskeletal: Degenerative changes of the spine. No acute osseous finding. L4-5 sclerotic degenerative change with vacuum disc phenomenon. No compression fracture. IMPRESSION: VASCULAR No significant or acute vascular finding by CTA. NON-VASCULAR Minor basilar atelectasis/hypoventilatory changes. Remote cholecystectomy and hysterectomy No other acute intra-abdominal or pelvic finding by CT. Electronically Signed   By: Jerilynn Mages.  Shick M.D.   On: 07/21/2018 16:00      Impression / Plan:   Terri Berry is a 42 y.o. 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  Differentials include chronic pancreatitis or peptic ulcer disease or diabetic gastroparesis  Diet as tolerated Increase Protonix to 40 mg p.o. twice daily Okay with Reglan as needed Recommend EGD for further evaluation  Superior mesenteric vein thrombosis (CMS-HCC) Found incidentally on CT Abdomen during ED course for pancreatitis. [place]ed [place] 65[place]course of Eliquis [place]  CT Angio abdomen and pelvis during this admission did not reveal any vaso-occlusive or venoocclusive process.  No evidence of SMV thrombosis.  Recommend to stop Eliquis at  this time  Chronic iron and B12 deficiency anemia Recommend parenteral iron and B12 Check anti-intrinsic factor antibodies, parietal cell antibodies EGD tomorrow with gastric and duodenal biopsies Recommend colonoscopy +/- VCE if EGD unremarkable  Thank you for involving me in the care of this patient.  Will follow along with you She will need close follow-up with GI after discharge    LOS: 1 day   RSherri Sear MD  07/23/2018, 12:42 PM   Note: This dictation was prepared with Dragon dictation along with smaller phrase technology. Any transcriptional errors that result from this process are unintentional.

## 2018-07-23 NOTE — Plan of Care (Signed)
Patient is still complaining of nausea and pain in right quadrant of abdomen.  Will be going for an EGD/colonoscopy tomorrow.  Currently drinking bowel prep.  Will be NPO at midnight.

## 2018-07-24 ENCOUNTER — Encounter: Payer: Self-pay | Admitting: *Deleted

## 2018-07-24 ENCOUNTER — Inpatient Hospital Stay: Payer: Medicaid Other | Admitting: Anesthesiology

## 2018-07-24 ENCOUNTER — Encounter
Admission: EM | Disposition: A | Discharge status: Home / Self Care | Payer: Self-pay | Source: Home / Self Care | Attending: Internal Medicine

## 2018-07-24 DIAGNOSIS — D500 Iron deficiency anemia secondary to blood loss (chronic): Secondary | ICD-10-CM

## 2018-07-24 HISTORY — PX: ESOPHAGOGASTRODUODENOSCOPY (EGD) WITH PROPOFOL: SHX5813

## 2018-07-24 HISTORY — PX: COLONOSCOPY WITH PROPOFOL: SHX5780

## 2018-07-24 LAB — GLUCOSE, CAPILLARY
Glucose-Capillary: 104 mg/dL — ABNORMAL HIGH (ref 70–99)
Glucose-Capillary: 106 mg/dL — ABNORMAL HIGH (ref 70–99)

## 2018-07-24 LAB — INTRINSIC FACTOR ANTIBODIES: Intrinsic Factor: 16.1 AU/mL — ABNORMAL HIGH (ref 0.0–1.1)

## 2018-07-24 LAB — TRIGLYCERIDES: Triglycerides: 287 mg/dL — ABNORMAL HIGH (ref <150)

## 2018-07-24 LAB — ANTI-PARIETAL ANTIBODY: Parietal Cell Antibody-IgG: 27.6 Units — ABNORMAL HIGH (ref 0.0–20.0)

## 2018-07-24 SURGERY — COLONOSCOPY
Anesthesia: General

## 2018-07-24 SURGERY — ESOPHAGOGASTRODUODENOSCOPY (EGD) WITH PROPOFOL
Anesthesia: General

## 2018-07-24 MED ORDER — LIDOCAINE HCL (CARDIAC) PF 100 MG/5ML IV SOSY
PREFILLED_SYRINGE | INTRAVENOUS | Status: DC | PRN
  Administered 2018-07-24: 100 mg via INTRAVENOUS

## 2018-07-24 MED ORDER — MIDAZOLAM HCL 2 MG/2ML IJ SOLN
INTRAMUSCULAR | Status: AC
  Filled 2018-07-24: qty 2

## 2018-07-24 MED ORDER — PROPOFOL 500 MG/50ML IV EMUL
INTRAVENOUS | Status: AC
  Filled 2018-07-24: qty 50

## 2018-07-24 MED ORDER — LIDOCAINE HCL (PF) 2 % IJ SOLN
INTRAMUSCULAR | Status: AC
  Filled 2018-07-24: qty 10

## 2018-07-24 MED ORDER — LINACLOTIDE 145 MCG PO CAPS
145.0000 ug | ORAL_CAPSULE | Freq: Every day | ORAL | Status: DC
  Administered 2018-07-25: 145 ug via ORAL
  Filled 2018-07-24: qty 1

## 2018-07-24 MED ORDER — PROPOFOL 10 MG/ML IV BOLUS
INTRAVENOUS | Status: DC | PRN
  Administered 2018-07-24: 70 mg via INTRAVENOUS

## 2018-07-24 MED ORDER — PROPOFOL 500 MG/50ML IV EMUL
INTRAVENOUS | Status: DC | PRN
  Administered 2018-07-24: 200 ug/kg/min via INTRAVENOUS

## 2018-07-24 MED ORDER — MIDAZOLAM HCL 5 MG/5ML IJ SOLN
INTRAMUSCULAR | Status: DC | PRN
  Administered 2018-07-24: 2 mg via INTRAVENOUS

## 2018-07-24 MED ORDER — APIXABAN 5 MG PO TABS
5.0000 mg | ORAL_TABLET | Freq: Two times a day (BID) | ORAL | Status: DC
  Administered 2018-07-24 – 2018-07-25 (×2): 5 mg via ORAL
  Filled 2018-07-24 (×2): qty 1

## 2018-07-24 MED ORDER — CLONAZEPAM 0.5 MG PO TABS
0.5000 mg | ORAL_TABLET | Freq: Three times a day (TID) | ORAL | Status: DC
  Administered 2018-07-24 – 2018-07-25 (×4): 0.5 mg via ORAL
  Filled 2018-07-24 (×4): qty 1

## 2018-07-24 NOTE — Progress Notes (Signed)
[employer] - [place] at [facility]   PATIENT NAME: Terri LotDiane Berry    MR#:  [id number]  DATE OF BIRTH:  25-Apr-1975  SUBJECTIVE:  CHIEF COMPLAINT:   Chief Complaint  Patient presents with  . Abdominal Pain   Continues to complain of epigastric pain and significant nausea. Scheduled for EGD and colonoscopy later today   REVIEW OF SYSTEMS:    Review of Systems  Constitutional: Positive for malaise/fatigue. Negative for chills and fever.  HENT: Negative for sore throat.   Eyes: Negative for blurred vision, double vision and pain.  Respiratory: Negative for cough, hemoptysis, shortness of breath and wheezing.   Cardiovascular: Negative for chest pain, palpitations, orthopnea and leg swelling.  Gastrointestinal: Positive for abdominal pain and nausea. Negative for constipation, diarrhea, heartburn and vomiting.  Genitourinary: Negative for dysuria and hematuria.  Musculoskeletal: Negative for back pain and joint pain.  Skin: Negative for rash.  Neurological: Negative for sensory change, speech change, focal weakness and headaches.  Endo/Heme/Allergies: Does not bruise/bleed easily.  Psychiatric/Behavioral: Negative for depression. The patient is not nervous/anxious.     DRUG ALLERGIES:   Allergies  Allergen Reactions  . Precedex [Dexmedetomidine Hcl In Nacl] Other (See Comments)    Significant Bradycardia  . Aspirin     Not indicated due to other anticoagulant use.  Marland Kitchen. Cefpodoxime     Tolerates augmentin and Keflex   . Clarithromycin   . Doxycycline   . Ferrous Gluconate   . Lactose Intolerance (Gi)   . Levofloxacin   . Rizatriptan   . Sulfa Antibiotics   . Topiramate   . Venlafaxine   . Zofran [Ondansetron Hcl]     VITALS:  Blood pressure (!) 153/86, pulse 61, temperature 98.3 F (36.8 C), temperature source Oral, resp. rate 15, height 5\' 7"  (1.702 m), weight 88.8 kg, SpO2 99 %.  PHYSICAL EXAMINATION:   Physical Exam  GENERAL:  43  y.o.-year-old patient lying in the bed with no acute distress.  EYES: Pupils equal, round, reactive to light and accommodation. No scleral icterus. Extraocular muscles intact.  HEENT: Head atraumatic, normocephalic. Oropharynx and nasopharynx clear.  NECK:  Supple, no jugular venous distention. No thyroid enlargement, no tenderness.  LUNGS: Normal breath sounds bilaterally, no wheezing, rales, rhonchi. No use of accessory muscles of respiration.  CARDIOVASCULAR: S1, S2 normal. No murmurs, rubs, or gallops.  ABDOMEN: Soft, tenderness in epigastric and right upper quadrant area, nondistended. Bowel sounds present. No organomegaly or mass.  EXTREMITIES: No cyanosis, clubbing or edema b/l.    NEUROLOGIC: Cranial nerves II through XII are intact. No focal Motor or sensory deficits b/l.   PSYCHIATRIC: The patient is alert and oriented x 3.  SKIN: No obvious rash, lesion, or ulcer.   LABORATORY PANEL:   CBC Recent Labs  Lab 07/22/18 0247  WBC 9.4  HGB 9.0*  HCT 30.8*  PLT 305   ------------------------------------------------------------------------------------------------------------------ Chemistries  Recent Labs  Lab 07/21/18 1205 07/22/18 0247  NA 139 138  K 3.4* 3.1*  CL 102 102  CO2 22 25  GLUCOSE 177* 98  BUN 11 12  CREATININE 0.95 0.91  CALCIUM 9.5 8.9  AST 26  --   ALT 22  --   ALKPHOS 54  --   BILITOT 0.7  --    ------------------------------------------------------------------------------------------------------------------  Cardiac Enzymes No results for input(s): TROPONINI in the last 168 hours. ------------------------------------------------------------------------------------------------------------------  RADIOLOGY:  No results found.   ASSESSMENT AND PLAN:   *Intractable nausea and vomiting.  Differential had  pancreatitis but lipase is normal and CT shows nothing acute.  Started Reglan for possible gastroparesis.  But patient's blood sugars have been  close to normal without any medications. Concern regarding gastritis or peptic ulcer disease.  EGD/colonoscopy later today  *Diabetes mellitus.  Patient was on medications in the past for a year and then stopped as her blood sugars were normal.  At this time her hemoglobin A1c is 7.7.   Needs metformin at discharge due to elevated hemoglobin A1c Diabetic diet  *Hypertension.  Continue home medications.  *GERD. On PPI  *DVT prophylaxis.  Patient on Eliquis.  Unclear but patient tells me she had clots in her abdomen with her episodes of pancreatitis.  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 Held for EGD  Likely discharge tomorrow once she tolerates food and pain improves.  All the records are reviewed and case discussed with Care Management/Social Worker Management plans discussed with the patient, family and they are in agreement.  CODE STATUS: Full code  DVT Prophylaxis: SCDs  TOTAL TIME TAKING CARE OF THIS PATIENT: 35 minutes.   POSSIBLE D/C IN 1-2 DAYS, DEPENDING ON CLINICAL CONDITION.  Leia Alf Harnoor Kohles M.D on 07/24/2018 at 1:42 PM  Between 7am to 6pm - Pager - [phone]  After 6pm go to [email or web address] - password [email or web address] [facility]  Office  [phone]  CC: Primary care physician; Joanie Coddington, MD  Note: This dictation was prepared with Dragon dictation along with smaller phrase technology. Any transcriptional errors that result from this process are unintentional.

## 2018-07-24 NOTE — Progress Notes (Signed)
Left VM at [phone] returning Peter's call (patient's husband).

## 2018-07-24 NOTE — Op Note (Signed)
[facility] Gastroenterology Patient Name: Terri Berry Procedure Date: 07/24/2018 1:52 PM MRN: [id number] Account #: [id number] Date of Birth: 1975/12/03 Admit Type: Inpatient Age: 43 Room: [facility] ENDO ROOM 4 Gender: Female Note Status: Finalized Procedure:            Upper GI endoscopy Indications:          Epigastric abdominal pain, Iron deficiency anemia                        secondary to chronic blood loss, Iron deficiency                        anemia, Nausea with vomiting Providers:            Lin Landsman MD, MD Medicines:            Monitored Anesthesia Care Complications:        No immediate complications. Estimated blood loss: None. Procedure:            Pre-Anesthesia Assessment:                       - Prior to the procedure, a History and Physical was                        performed, and patient medications and allergies were                        reviewed. The patient is competent. The risks and                        benefits of the procedure and the sedation options and                        risks were discussed with the patient. All questions                        were answered and informed consent was obtained.                        Patient identification and proposed procedure were                        verified by the physician, the nurse, the                        anesthesiologist, the anesthetist and the technician in                        the pre-procedure area in the procedure room in the                        endoscopy suite. Mental Status Examination: alert and                        oriented. Airway Examination: normal oropharyngeal                        airway and neck mobility. Respiratory Examination:  clear to auscultation. CV Examination: normal.                        Prophylactic Antibiotics: The patient does not require                        prophylactic antibiotics. Prior Anticoagulants:  The                        patient has taken Eliquis (apixaban), last dose was 1                        day prior to procedure. ASA Grade Assessment: II - A                        patient with mild systemic disease. After reviewing the                        risks and benefits, the patient was deemed in                        satisfactory condition to undergo the procedure. The                        anesthesia plan was to use monitored anesthesia care                        (MAC). Immediately prior to administration of                        medications, the patient was re-assessed for adequacy                        to receive sedatives. The heart rate, respiratory rate,                        oxygen saturations, blood pressure, adequacy of                        pulmonary ventilation, and response to care were                        monitored throughout the procedure. The physical status                        of the patient was re-assessed after the procedure.                       After obtaining informed consent, the endoscope was                        passed under direct vision. Throughout the procedure,                        the patient's blood pressure, pulse, and oxygen                        saturations were monitored continuously. The Endoscope  was introduced through the mouth, and advanced to the                        second part of duodenum. The upper GI endoscopy was                        accomplished without difficulty. The patient tolerated                        the procedure well. Findings:      The duodenal bulb and second portion of the duodenum were normal.       Biopsies for histology were taken with a cold forceps for evaluation of       celiac disease.      The entire examined stomach was normal. Biopsies were taken with a cold       forceps for Helicobacter pylori testing.      The cardia and gastric fundus were normal on  retroflexion.      The gastroesophageal junction and examined esophagus were normal. Impression:           - Normal duodenal bulb and second portion of the                        duodenum. Biopsied.                       - Normal stomach. Biopsied.                       - Normal gastroesophageal junction and esophagus. Recommendation:       - Await pathology results.                       - Proceed with colonoscopy as scheduled                       See colonoscopy report Procedure Code(s):    --- Professional ---                       [phone], Esophagogastroduodenoscopy, flexible, transoral;                        with biopsy, single or multiple Diagnosis Code(s):    --- Professional ---                       R10.13, Epigastric pain                       D50.0, Iron deficiency anemia secondary to blood loss                        (chronic)                       D50.9, Iron deficiency anemia, unspecified                       R11.2, Nausea with vomiting, unspecified CPT copyright 2019 American Medical Association. All rights reserved. The codes documented in this report are preliminary and upon coder review may  be revised to meet current compliance requirements. Dr. Ulyess Mort Lin Landsman MD, MD 07/24/2018  2:13:01 PM This report has been signed electronically. Number of Addenda: 0 Note Initiated On: 07/24/2018 1:52 PM Estimated Blood Loss: Estimated blood loss: none.      [facility]

## 2018-07-24 NOTE — Progress Notes (Signed)
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  Continue Creon, Lyrica, recommend to add tramadol for short-term Patient does not have insurance, has charity care, her PCP is at [facility], [place] [address]  [place]  [place], [place] [address]  Main: [phone]  Fax: [phone] Pager: [phone]

## 2018-07-24 NOTE — Transfer of Care (Signed)
Immediate Anesthesia Transfer of Care Note  Patient: Terri Berry  Procedure(s) Performed: ESOPHAGOGASTRODUODENOSCOPY (EGD) WITH PROPOFOL (N/A ) COLONOSCOPY WITH PROPOFOL (N/A )  Patient Location: PACU  Anesthesia Type:General  Level of Consciousness: alert   Airway & Oxygen Therapy: Patient Spontanous Breathing  Post-op Assessment: Report given to RN  Post vital signs: stable  Last Vitals:  Vitals Value Taken Time  BP 98/74 07/24/18 1438  Temp 36.1 C 07/24/18 1438  Pulse 79 07/24/18 1440  Resp 15 07/24/18 1440  SpO2 96 % 07/24/18 1440  Vitals shown include unvalidated device data.  Last Pain:  Vitals:   07/24/18 1438  TempSrc: Tympanic  PainSc:       Patients Stated Pain Goal: 0 (50/93/26 7124)  Complications: No apparent anesthesia complications

## 2018-07-24 NOTE — Anesthesia Preprocedure Evaluation (Signed)
Anesthesia Evaluation  Patient identified by MRN, date of birth, ID band Patient awake    Reviewed: Allergy & Precautions, H&P , NPO status , Patient's Chart, lab work & pertinent test results  Airway Mallampati: III  TM Distance: >3 FB Neck ROM: full    Dental  (+) Chipped   Pulmonary Current Smoker,           Cardiovascular hypertension,      Neuro/Psych PSYCHIATRIC DISORDERS Anxiety Depression negative neurological ROS     GI/Hepatic Neg liver ROS, GERD  Controlled,  Endo/Other  diabetes  Renal/GU negative Renal ROS  negative genitourinary   Musculoskeletal   Abdominal   Peds  Hematology negative hematology ROS (+)   Anesthesia Other Findings Obese  Past Medical History: No date: Allergy No date: Anxiety No date: Depression No date: GERD (gastroesophageal reflux disease) No date: Hypertension No date: IBS (irritable bowel syndrome) No date: Pancreatitis  Past Surgical History: No date: ABDOMINAL HYSTERECTOMY No date: GALLBLADDER SURGERY  BMI    Body Mass Index: 30.66 kg/m      Reproductive/Obstetrics negative OB ROS                             Anesthesia Physical Anesthesia Plan  ASA: II  Anesthesia Plan: General   Post-op Pain Management:    Induction:   PONV Risk Score and Plan: Propofol infusion and TIVA  Airway Management Planned:   Additional Equipment:   Intra-op Plan:   Post-operative Plan:   Informed Consent: I have reviewed the patients History and Physical, chart, labs and discussed the procedure including the risks, benefits and alternatives for the proposed anesthesia with the patient or authorized representative who has indicated his/her understanding and acceptance.     Dental Advisory Given  Plan Discussed with: Anesthesiologist and CRNA  Anesthesia Plan Comments:         Anesthesia Quick Evaluation

## 2018-07-24 NOTE — Anesthesia Post-op Follow-up Note (Signed)
Anesthesia QCDR form completed.        

## 2018-07-24 NOTE — Op Note (Signed)
[facility] Gastroenterology Patient Name: Terri Berry Procedure Date: 07/24/2018 1:51 PM MRN: [id number] Account #: [id number] Date of Birth: [birth year] Admit Type: Inpatient Age: 43 Room: [facility] ENDO ROOM 4 Gender: Female Note Status: Finalized Procedure:            Colonoscopy Indications:          Unexplained iron deficiency anemia Providers:            Lin Landsman MD, MD Medicines:            Monitored Anesthesia Care Complications:        No immediate complications. Estimated blood loss: None. Procedure:            Pre-Anesthesia Assessment:                       - Prior to the procedure, a History and Physical was                        performed, and patient medications and allergies were                        reviewed. The patient is competent. The risks and                        benefits of the procedure and the sedation options and                        risks were discussed with the patient. All questions                        were answered and informed consent was obtained.                        Patient identification and proposed procedure were                        verified by the physician, the nurse, the                        anesthesiologist, the anesthetist and the technician in                        the pre-procedure area in the procedure room in the                        endoscopy suite. Mental Status Examination: alert and                        oriented. Airway Examination: normal oropharyngeal                        airway and neck mobility. Respiratory Examination:                        clear to auscultation. CV Examination: normal.                        Prophylactic Antibiotics: The patient does not require  prophylactic antibiotics. Prior Anticoagulants: The                        patient has taken Eliquis (apixaban), last dose was 1                        day prior to procedure. ASA Grade  Assessment: II - A                        patient with mild systemic disease. After reviewing the                        risks and benefits, the patient was deemed in                        satisfactory condition to undergo the procedure. The                        anesthesia plan was to use monitored anesthesia care                        (MAC). Immediately prior to administration of                        medications, the patient was re-assessed for adequacy                        to receive sedatives. The heart rate, respiratory rate,                        oxygen saturations, blood pressure, adequacy of                        pulmonary ventilation, and response to care were                        monitored throughout the procedure. The physical status                        of the patient was re-assessed after the procedure.                       After obtaining informed consent, the colonoscope was                        passed under direct vision. Throughout the procedure,                        the patient's blood pressure, pulse, and oxygen                        saturations were monitored continuously. The                        Colonoscope was introduced through the anus and                        advanced to the the cecum, identified by appendiceal  orifice and ileocecal valve. The colonoscopy was                        unusually difficult due to a tortuous colon and the                        patient's body habitus. Successful completion of the                        procedure was aided by changing the patient to a supine                        position and applying abdominal pressure. The patient                        tolerated the procedure well. The quality of the bowel                        preparation was poor. Findings:      The perianal and digital rectal examinations were normal. Pertinent       negatives include normal sphincter tone and no  palpable rectal lesions.      Copious quantities of stool was found in the entire colon, interfering       with visualization. Impression:           - Preparation of the colon was poor.                       - Stool in the entire examined colon.                       - No specimens collected. Recommendation:       - Return patient to hospital ward for ongoing care.                       - Diabetic (ADA) diet today.                       - Continue present medications.                       - Repeat colonoscopy in 6 months because the bowel                        preparation was poor.                       - Return to GI office in 4 weeks. Procedure Code(s):    --- Professional ---                       [phone], Colonoscopy, flexible; diagnostic, including                        collection of specimen(s) by brushing or washing, when                        performed (separate procedure) Diagnosis Code(s):    --- Professional ---                       D50.9, Iron deficiency  anemia, unspecified CPT copyright 2019 American Medical Association. All rights reserved. The codes documented in this report are preliminary and upon coder review may  be revised to meet current compliance requirements. Dr. Ulyess Mort Lin Landsman MD, MD 07/24/2018 2:36:07 PM This report has been signed electronically. Number of Addenda: 0 Note Initiated On: 07/24/2018 1:51 PM Scope Withdrawal Time: 0 hours 4 minutes 47 seconds  Total Procedure Duration: 0 hours 18 minutes 22 seconds  Estimated Blood Loss: Estimated blood loss: none.      [facility]

## 2018-07-25 LAB — CBC
HCT: 30.5 % — ABNORMAL LOW (ref 36.0–46.0)
Hemoglobin: 9 g/dL — ABNORMAL LOW (ref 12.0–15.0)
MCH: 21.7 pg — ABNORMAL LOW (ref 26.0–34.0)
MCHC: 29.5 g/dL — ABNORMAL LOW (ref 30.0–36.0)
MCV: 73.7 fL — ABNORMAL LOW (ref 80.0–100.0)
Platelets: 281 10*3/uL (ref 150–400)
RBC: 4.14 MIL/uL (ref 3.87–5.11)
RDW: 17.2 % — ABNORMAL HIGH (ref 11.5–15.5)
WBC: 9.4 10*3/uL (ref 4.0–10.5)
nRBC: 0 % (ref 0.0–0.2)

## 2018-07-25 LAB — BASIC METABOLIC PANEL
Anion gap: 8 (ref 5–15)
BUN: 8 mg/dL (ref 6–20)
CO2: 27 mmol/L (ref 22–32)
Calcium: 8.9 mg/dL (ref 8.9–10.3)
Chloride: 104 mmol/L (ref 98–111)
Creatinine, Ser: 0.82 mg/dL (ref 0.44–1.00)
GFR calc Af Amer: >60 mL/min (ref >60)
GFR calc non Af Amer: >60 mL/min (ref >60)
Glucose, Bld: 103 mg/dL — ABNORMAL HIGH (ref 70–99)
Potassium: 3.6 mmol/L (ref 3.5–5.1)
Sodium: 139 mmol/L (ref 135–145)

## 2018-07-25 MED ORDER — TRAMADOL HCL 50 MG PO TABS: 50.0000 mg | ORAL_TABLET | Freq: Every day | ORAL | 0 refills | Status: DC | PRN

## 2018-07-25 MED ORDER — LINACLOTIDE 145 MCG PO CAPS: 145.0000 ug | ORAL_CAPSULE | Freq: Every day | ORAL | 0 refills | Status: AC

## 2018-07-25 NOTE — Progress Notes (Signed)
Per patient's request, Taxi voucher given by Merrily Pew, CM.

## 2018-07-25 NOTE — Discharge Summary (Addendum)
[facility] - [place] at [facility]   PATIENT NAME: Terri Berry    MR#:  [id number]  DATE OF BIRTH:  Sep 24, 1975  DATE OF ADMISSION:  07/21/2018 ADMITTING PHYSICIAN: Oralia Manisavid Willis, MD  DATE OF DISCHARGE: No discharge date for patient encounter.  PRIMARY CARE PHYSICIAN: Thomes DinningWeeks, Cynthia, MD    ADMISSION DIAGNOSIS:  Intractable generalized abdominal pain [R10.84]  DISCHARGE DIAGNOSIS:  Principal Problem:   Intractable nausea and vomiting Active Problems:   GERD (gastroesophageal reflux disease)   HTN (hypertension)   Anxiety   Diabetes (HCC)   Iron deficiency anemia due to chronic blood loss   SECONDARY DIAGNOSIS:   Past Medical History:  Diagnosis Date  . Allergy   . Anxiety   . Depression   . GERD (gastroesophageal reflux disease)   . Hypertension   . IBS (irritable bowel syndrome)   . Pancreatitis     HOSPITAL COURSE:  * Intractable nausea and vomiting  Resolved Treated treated w/ prn antiemetics, IV fluids, Reglan  unclear etiology  -  though the patient does have a history of IBS, less likely due to diabetic gastroparesis  *Acute abdominal pain  Resolved  Status post EGD which was normal, colonoscopy was a poor study-Dr. Vanga/gastroenterology recommended reevaluation in 4 weeks with possible repeat endoscopy in 6 weeks, started on Linzess for problems with constipations   *Acute on chronic constipation  Linzess  Gastroenterology input appreciated   *History of DVT  Continue Eliquis   *Chronic pancreatitis  Stable   DISCHARGE CONDITIONS:   stable  CONSULTS OBTAINED:    DRUG ALLERGIES:   Allergies  Allergen Reactions  . Precedex [Dexmedetomidine Hcl In Nacl] Other (See Comments)    Significant Bradycardia  . Aspirin     Not indicated due to other anticoagulant use.  Marland Kitchen. Cefpodoxime     Tolerates augmentin and Keflex   . Clarithromycin   . Doxycycline   . Ferrous Gluconate   . Lactose Intolerance (Gi)   .  Levofloxacin   . Rizatriptan   . Sulfa Antibiotics   . Topiramate   . Venlafaxine   . Zofran [Ondansetron Hcl]     DISCHARGE MEDICATIONS:   Allergies as of 07/25/2018      Reactions   Precedex [dexmedetomidine Hcl In Nacl] Other (See Comments)   Significant Bradycardia   Aspirin    Not indicated due to other anticoagulant use.   Cefpodoxime    Tolerates augmentin and Keflex    Clarithromycin    Doxycycline    Ferrous Gluconate    Lactose Intolerance (gi)    Levofloxacin    Rizatriptan    Sulfa Antibiotics    Topiramate    Venlafaxine    Zofran [ondansetron Hcl]       Medication List    STOP taking these medications   Vitamin D (Ergocalciferol) 1.25 MG (50000 UT) Caps capsule Commonly known as: DRISDOL     TAKE these medications   albuterol 108 (90 Base) MCG/ACT inhaler Commonly known as: VENTOLIN HFA Inhale 2 puffs into the lungs every 6 (six) hours as needed for wheezing or shortness of breath.   apixaban 5 MG Tabs tablet Commonly known as: ELIQUIS Take 5 mg by mouth every 12 (twelve) hours.   atorvastatin 40 MG tablet Commonly known as: LIPITOR Take 40 mg by mouth daily.   busPIRone 10 MG tablet Commonly known as: BUSPAR Take 10 mg by mouth 3 (three) times daily.   cetirizine 10 MG tablet Commonly known as:  ZYRTEC Take 10 mg by mouth daily.   clonazePAM 1 MG tablet Commonly known as: KLONOPIN Take 1 mg by mouth 3 (three) times daily.   dicyclomine 10 MG capsule Commonly known as: BENTYL Take 10 mg by mouth 3 (three) times daily as needed for spasms.   fenofibrate 160 MG tablet Take 160 mg by mouth daily.   FLUoxetine 20 MG capsule Commonly known as: PROZAC Take 60 mg by mouth daily.   hydrocortisone 25 MG suppository Commonly known as: ANUSOL-HC Place 25 mg rectally 2 (two) times a day.   linaclotide 145 MCG Caps capsule Commonly known as: LINZESS Take 1 capsule (145 mcg total) by mouth daily before breakfast. Start taking on: July 26, 2018   Melatonin 3 MG Tabs Take 6 mg by mouth at bedtime.   mirtazapine 30 MG tablet Commonly known as: REMERON Take 30 mg by mouth at bedtime.   omeprazole 20 MG capsule Commonly known as: PRILOSEC Take 40 mg by mouth 2 (two) times daily.   Pancrelipase (Lip-Prot-Amyl) 24000-76000 units Cpep Take 2 capsules by mouth 3 (three) times daily with meals.   pregabalin 200 MG capsule Commonly known as: LYRICA Take 200 mg by mouth 3 (three) times daily.   prochlorperazine 10 MG tablet Commonly known as: COMPAZINE Take 10 mg by mouth daily.   promethazine 25 MG tablet Commonly known as: PHENERGAN Take 25 mg by mouth at bedtime as needed for nausea.   senna 8.6 MG Tabs tablet Commonly known as: SENOKOT Take 4 tablets by mouth daily.   tiZANidine 4 MG tablet Commonly known as: ZANAFLEX Take 4 mg by mouth 2 (two) times daily as needed for muscle spasms.   traMADol 50 MG tablet Commonly known as: ULTRAM Take 1 tablet (50 mg total) by mouth daily as needed for moderate pain.        DISCHARGE INSTRUCTIONS:   If you experience worsening of your admission symptoms, develop shortness of breath, life threatening emergency, suicidal or homicidal thoughts you must seek medical attention immediately by calling 911 or calling your MD immediately  if symptoms less severe.  You Must read complete instructions/literature along with all the possible adverse reactions/side effects for all the Medicines you take and that have been prescribed to you. Take any new Medicines after you have completely understood and accept all the possible adverse reactions/side effects.   Please note  You were cared for by a hospitalist during your hospital stay. If you have any questions about your discharge medications or the care you received while you were in the hospital after you are discharged, you can call the unit and asked to speak with the hospitalist on call if the hospitalist that took care of you  is not available. Once you are discharged, your primary care physician will handle any further medical issues. Please note that NO REFILLS for any discharge medications will be authorized once you are discharged, as it is imperative that you return to your primary care physician (or establish a relationship with a primary care physician if you do not have one) for your aftercare needs so that they can reassess your need for medications and monitor your lab values.    Today   CHIEF COMPLAINT:   Chief Complaint  Patient presents with  . Abdominal Pain    HISTORY OF PRESENT ILLNESS:   43 y.o. female who presents with chief complaint as above.  Patient presents to the ED with a complaint of 2 days abdominal pain  with nausea and vomiting.  She states that her pain feels the same as it did when she had pancreatitis.  She states that she decreased her p.o. intake significantly hoping that that would help improve her pain, however it did not.  Here in the ED tonight her lipase is normal.  CT of her abdomen and pelvis does not show anything that would explain her pain.  She had persistent nausea and vomiting despite multiple medications given in the ED.  Hospitalist were called for admission  VITAL SIGNS:  Blood pressure 117/64, pulse 60, temperature 97.9 F (36.6 C), temperature source Oral, resp. rate 17, height 5\' 7"  (1.702 m), weight 88.8 kg, SpO2 96 %.  I/O:    Intake/Output Summary (Last 24 hours) at 07/25/2018 1143 Last data filed at 07/25/2018 1100 Gross per 24 hour  Intake 890 ml  Output 900 ml  Net -10 ml    PHYSICAL EXAMINATION:  GENERAL:  43 y.o.-year-old patient lying in the bed with no acute distress.  EYES: Pupils equal, round, reactive to light and accommodation. No scleral icterus. Extraocular muscles intact.  HEENT: Head atraumatic, normocephalic. Oropharynx and nasopharynx clear.  NECK:  Supple, no jugular venous distention. No thyroid enlargement, no tenderness.  LUNGS:  Normal breath sounds bilaterally, no wheezing, rales,rhonchi or crepitation. No use of accessory muscles of respiration.  CARDIOVASCULAR: S1, S2 normal. No murmurs, rubs, or gallops.  ABDOMEN: Soft, non-tender, non-distended. Bowel sounds present. No organomegaly or mass.  EXTREMITIES: No pedal edema, cyanosis, or clubbing.  NEUROLOGIC: Cranial nerves II through XII are intact. Muscle strength 5/5 in all extremities. Sensation intact. Gait not checked.  PSYCHIATRIC: The patient is alert and oriented x 3.  SKIN: No obvious rash, lesion, or ulcer.   DATA REVIEW:   CBC Recent Labs  Lab 07/25/18 0651  WBC 9.4  HGB 9.0*  HCT 30.5*  PLT 281    Chemistries  Recent Labs  Lab 07/21/18 1205  07/25/18 0651  NA 139   < > 139  K 3.4*   < > 3.6  CL 102   < > 104  CO2 22   < > 27  GLUCOSE 177*   < > 103*  BUN 11   < > 8  CREATININE 0.95   < > 0.82  CALCIUM 9.5   < > 8.9  AST 26  --   --   ALT 22  --   --   ALKPHOS 54  --   --   BILITOT 0.7  --   --    < > = values in this interval not displayed.    Cardiac Enzymes No results for input(s): TROPONINI in the last 168 hours.  Microbiology Results  Results for orders placed or performed during the hospital encounter of 07/21/18  SARS Coronavirus 2 (CEPHEID - Performed in [facility] lab), Hosp Order     Status: None   Collection Time: 07/21/18  8:32 PM   Specimen: Nasopharyngeal Swab  Result Value Ref Range Status   SARS Coronavirus 2 NEGATIVE NEGATIVE Final    Comment: (NOTE) If result is NEGATIVE SARS-CoV-2 target nucleic acids are NOT DETECTED. The SARS-CoV-2 RNA is generally detectable in upper and lower  respiratory specimens during the acute phase of infection. The lowest  concentration of SARS-CoV-2 viral copies this assay can detect is 250  copies / mL. A negative result does not preclude SARS-CoV-2 infection  and should not be used as the sole basis for treatment or  other  patient management decisions.  A  negative result may occur with  improper specimen collection / handling, submission of specimen other  than nasopharyngeal swab, presence of viral mutation(s) within the  areas targeted by this assay, and inadequate number of viral copies  (<250 copies / mL). A negative result must be combined with clinical  observations, patient history, and epidemiological information. If result is POSITIVE SARS-CoV-2 target nucleic acids are DETECTED. The SARS-CoV-2 RNA is generally detectable in upper and lower  respiratory specimens dur ing the acute phase of infection.  Positive  results are indicative of active infection with SARS-CoV-2.  Clinical  correlation with patient history and other diagnostic information is  necessary to determine patient infection status.  Positive results do  not rule out bacterial infection or co-infection with other viruses. If result is PRESUMPTIVE POSTIVE SARS-CoV-2 nucleic acids MAY BE PRESENT.   A presumptive positive result was obtained on the submitted specimen  and confirmed on repeat testing.  While 2019 novel coronavirus  (SARS-CoV-2) nucleic acids may be present in the submitted sample  additional confirmatory testing may be necessary for epidemiological  and / or clinical management purposes  to differentiate between  SARS-CoV-2 and other Sarbecovirus currently known to infect humans.  If clinically indicated additional testing with an alternate test  methodology [phone] is advised. The SARS-CoV-2 RNA is generally  detectable in upper and lower respiratory sp ecimens during the acute  phase of infection. The expected result is Negative. Fact Sheet for Patients:  BoilerBrush.com.cyhttps://www.fda.gov/media/136312/download Fact Sheet for Healthcare Providers: https://pope.com/https://www.fda.gov/media/136313/download This test is not yet approved or cleared by the [place] FDA and has been authorized for detection and/or diagnosis of SARS-CoV-2 by FDA under an Emergency Use  Authorization (EUA).  This EUA will remain in effect (meaning this test can be used) for the duration of the COVID-19 declaration under Section 564(b)(1) of the Act, 21 U.S.C. section 360bbb-3(b)(1), unless the authorization is terminated or revoked sooner. Performed at [facility], [address] [place], [place] [address]     RADIOLOGY:  No results found.  EKG:   Orders placed or performed during the hospital encounter of 07/21/18  . EKG 12-Lead  . EKG 12-Lead      Management plans discussed with the patient, family and they are in agreement.  CODE STATUS:     Code Status Orders  (From admission, onward)         Start     Ordered   07/21/18 2317  Full code  Continuous     07/21/18 2317        Code Status History    Date Active Date Inactive Code Status Order ID Comments User Context   07/02/2018 2308 07/03/2018 2205 Full Code [id number]  Oralia ManisWillis, David, MD Inpatient   05/26/2018 0211 05/28/2018 1954 Full Code [id number]  Oralia ManisWillis, David, MD Inpatient   03/18/2018 1409 03/27/2018 2130 Full Code [id number]  Milagros LollSudini, Srikar, MD ED   02/09/2018 0921 02/15/2018 1549 Full Code [id number]  Barbaraann RondoSridharan, Prasanna, MD Inpatient   11/28/2017 0352 12/04/2017 2009 Full Code [id number]  Barbaraann RondoSridharan, Prasanna, MD Inpatient   Advance Care Planning Activity      TOTAL TIME TAKING CARE OF THIS PATIENT: 40 minutes.    Evelena AsaMontell D Salary M.D on 07/25/2018 at 11:43 AM  Between 7am to 6pm - Pager - [phone]  After 6pm go to [email or web address] - Social research officer, governmentpassword EPAS [email or web address]  [facility] [place] [facility]  Office  [phone]  CC: Primary care physician; Tania AdeWeeks,  Aram Beecham, MD   Note: This dictation was prepared with Dragon dictation along with smaller phrase technology. Any transcriptional errors that result from this process are unintentional.

## 2018-07-27 NOTE — Anesthesia Postprocedure Evaluation (Signed)
Anesthesia Post Note  Patient: Terri Berry  Procedure(s) Performed: ESOPHAGOGASTRODUODENOSCOPY (EGD) WITH PROPOFOL (N/A ) COLONOSCOPY WITH PROPOFOL (N/A )  Patient location during evaluation: PACU Anesthesia Type: General Level of consciousness: awake and alert Pain management: pain level controlled Vital Signs Assessment: post-procedure vital signs reviewed and stable Respiratory status: spontaneous breathing, nonlabored ventilation and respiratory function stable Cardiovascular status: blood pressure returned to baseline and stable Postop Assessment: no apparent nausea or vomiting Anesthetic complications: no     Last Vitals:  Vitals:   07/24/18 2100 07/25/18 0625  BP: 135/72 117/64  Pulse: 66 60  Resp: 17   Temp: 36.8 C 36.6 C  SpO2: 99% 96%    Last Pain:  Vitals:   07/25/18 1018  TempSrc:   PainSc: [place]

## 2018-07-28 LAB — SURGICAL PATHOLOGY

## 2018-07-29 ENCOUNTER — Encounter: Payer: Self-pay | Admitting: Gastroenterology

## 2018-08-09 ENCOUNTER — Encounter: Payer: Self-pay | Admitting: Emergency Medicine

## 2018-08-09 ENCOUNTER — Other Ambulatory Visit: Payer: Self-pay

## 2018-08-09 DIAGNOSIS — E119 Type 2 diabetes mellitus without complications: Secondary | ICD-10-CM | POA: Insufficient documentation

## 2018-08-09 DIAGNOSIS — Z79899 Other long term (current) drug therapy: Secondary | ICD-10-CM | POA: Diagnosis not present

## 2018-08-09 DIAGNOSIS — I10 Essential (primary) hypertension: Secondary | ICD-10-CM | POA: Diagnosis not present

## 2018-08-09 DIAGNOSIS — F17210 Nicotine dependence, cigarettes, uncomplicated: Secondary | ICD-10-CM | POA: Insufficient documentation

## 2018-08-09 DIAGNOSIS — Z7901 Long term (current) use of anticoagulants: Secondary | ICD-10-CM | POA: Diagnosis not present

## 2018-08-09 DIAGNOSIS — R112 Nausea with vomiting, unspecified: Secondary | ICD-10-CM | POA: Insufficient documentation

## 2018-08-09 DIAGNOSIS — R1011 Right upper quadrant pain: Secondary | ICD-10-CM | POA: Insufficient documentation

## 2018-08-09 MED ORDER — DICYCLOMINE HCL 10 MG PO CAPS
20.0000 mg | ORAL_CAPSULE | Freq: Once | ORAL | Status: AC
  Administered 2018-08-10: 20 mg via ORAL
  Filled 2018-08-09: qty 2

## 2018-08-09 NOTE — ED Notes (Signed)
Patient to waiting room via wheelchair by EMS.  EMS reports right sided abdominal pain, hx of pancreatitis.  Reports pain started yesterday and she is out of her medications.  +nausea, CBG 143, temp 98.4, attempted IV but unsuccessful, had recent admission for same.

## 2018-08-09 NOTE — ED Triage Notes (Signed)
Pt arrived via EMS to waiting room; says "it's my pancrease"; c/o right upper quadrant pain x 2 days with nausea; pt has been here several times in the last few months for same, twice requiring admission; pt awake and alert;

## 2018-08-10 ENCOUNTER — Emergency Department
Admission: EM | Admit: 2018-08-10 | Discharge: 2018-08-10 | Disposition: A | Discharge status: Home / Self Care | Payer: Medicaid Other | Attending: Emergency Medicine | Admitting: Emergency Medicine

## 2018-08-10 ENCOUNTER — Emergency Department: Payer: Medicaid Other

## 2018-08-10 DIAGNOSIS — R109 Unspecified abdominal pain: Secondary | ICD-10-CM

## 2018-08-10 LAB — CBC
HCT: 31.1 % — ABNORMAL LOW (ref 36.0–46.0)
Hemoglobin: 9.3 g/dL — ABNORMAL LOW (ref 12.0–15.0)
MCH: 22 pg — ABNORMAL LOW (ref 26.0–34.0)
MCHC: 29.9 g/dL — ABNORMAL LOW (ref 30.0–36.0)
MCV: 73.7 fL — ABNORMAL LOW (ref 80.0–100.0)
Platelets: 399 10*3/uL (ref 150–400)
RBC: 4.22 MIL/uL (ref 3.87–5.11)
RDW: 19 % — ABNORMAL HIGH (ref 11.5–15.5)
WBC: 9.2 10*3/uL (ref 4.0–10.5)
nRBC: 0 % (ref 0.0–0.2)

## 2018-08-10 LAB — URINALYSIS, COMPLETE (UACMP) WITH MICROSCOPIC
Bacteria, UA: NONE SEEN
Bilirubin Urine: NEGATIVE
Glucose, UA: NEGATIVE mg/dL
Hgb urine dipstick: NEGATIVE
Ketones, ur: NEGATIVE mg/dL
Leukocytes,Ua: NEGATIVE
Nitrite: NEGATIVE
Protein, ur: NEGATIVE mg/dL
Specific Gravity, Urine: 1.021 (ref 1.005–1.030)
pH: 6 (ref 5.0–8.0)

## 2018-08-10 LAB — LIPASE, BLOOD: Lipase: 29 U/L (ref 11–51)

## 2018-08-10 LAB — COMPREHENSIVE METABOLIC PANEL
ALT: 15 U/L (ref 0–44)
AST: 17 U/L (ref 15–41)
Albumin: 4.1 g/dL (ref 3.5–5.0)
Alkaline Phosphatase: 59 U/L (ref 38–126)
Anion gap: 9 (ref 5–15)
BUN: 14 mg/dL (ref 6–20)
CO2: 27 mmol/L (ref 22–32)
Calcium: 8.7 mg/dL — ABNORMAL LOW (ref 8.9–10.3)
Chloride: 103 mmol/L (ref 98–111)
Creatinine, Ser: 0.97 mg/dL (ref 0.44–1.00)
GFR calc Af Amer: >60 mL/min (ref >60)
GFR calc non Af Amer: >60 mL/min (ref >60)
Glucose, Bld: 126 mg/dL — ABNORMAL HIGH (ref 70–99)
Potassium: 3.3 mmol/L — ABNORMAL LOW (ref 3.5–5.1)
Sodium: 139 mmol/L (ref 135–145)
Total Bilirubin: 0.3 mg/dL (ref 0.3–1.2)
Total Protein: 7.1 g/dL (ref 6.5–8.1)

## 2018-08-10 MED ORDER — KETOROLAC TROMETHAMINE 30 MG/ML IJ SOLN
30.0000 mg | Freq: Once | INTRAMUSCULAR | Status: AC
  Administered 2018-08-10: 06:00:00 30 mg via INTRAVENOUS
  Filled 2018-08-10: qty 1

## 2018-08-10 MED ORDER — SUCRALFATE 1 G PO TABS: 1.0000 g | ORAL_TABLET | Freq: Four times a day (QID) | ORAL | 0 refills | Status: AC

## 2018-08-10 MED ORDER — HALOPERIDOL LACTATE 5 MG/ML IJ SOLN
5.0000 mg | Freq: Once | INTRAMUSCULAR | Status: AC
  Administered 2018-08-10: 05:00:00 5 mg via INTRAVENOUS
  Filled 2018-08-10: qty 1

## 2018-08-10 MED ORDER — LIDOCAINE VISCOUS HCL 2 % MT SOLN
15.0000 mL | Freq: Once | OROMUCOSAL | Status: AC
  Administered 2018-08-10: 07:00:00 15 mL via OROMUCOSAL
  Filled 2018-08-10: qty 15

## 2018-08-10 MED ORDER — SODIUM CHLORIDE 0.9 % IV BOLUS
1000.0000 mL | Freq: Once | INTRAVENOUS | Status: AC
  Administered 2018-08-10: 05:00:00 1000 mL via INTRAVENOUS

## 2018-08-10 NOTE — ED Provider Notes (Signed)
[facility] Emergency Department Provider Note   ____________________________________________   I have reviewed the triage vital signs and the nursing notes.   HISTORY  Chief Complaint Abdominal Pain   History limited by: Not Limited   HPI Terri Berry is a 43 y.o. female who presents to the emergency department today because of concern for abdominal pain. The patient states that it is located in the right upper quadrant and radiates to her back. It started two days ago. It reminded the patient of the pain she has with pancreatitis so she tried decreasing her oral intake however the pain persisted. Has had some associated nausea and vomiting. Denies any bloody vomit or stool. Denies any fevers.   Records reviewed. Per medical record review patient has a history of IBS. Recent admission for similar symptoms.    Past Medical History:  Diagnosis Date  . Allergy   . Anxiety   . Depression   . GERD (gastroesophageal reflux disease)   . Hypertension   . IBS (irritable bowel syndrome)   . Pancreatitis     Patient Active Problem List   Diagnosis Date Noted  . Iron deficiency anemia due to chronic blood loss   . Intractable nausea and vomiting 07/21/2018  . Acute renal failure ([place]) 07/03/2018  . Hypotension 05/26/2018  . 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 Diabetes ([place]) 05/26/2018  . GERD (gastroesophageal reflux disease) 05/25/2018  . HTN (hypertension) 05/25/2018  . Anxiety 05/25/2018  . AKI (acute kidney injury) ([place]) 05/25/2018  . Distended abdomen   . Acute pancreatitis 03/18/2018  . Acute hypoxemic respiratory failure ([place]) 02/09/2018  . Overdose 11/28/2017    Past Surgical History:  Procedure Laterality Date  . ABDOMINAL HYSTERECTOMY    . COLONOSCOPY WITH PROPOFOL N/A 07/24/2018   Procedure: COLONOSCOPY WITH PROPOFOL;  Surgeon: Lin Landsman, MD;  [facility] ENDOSCOPY;  Service: Gastroenterology;  Laterality: N/A;   . ESOPHAGOGASTRODUODENOSCOPY (EGD) WITH PROPOFOL N/A 07/24/2018   Procedure: ESOPHAGOGASTRODUODENOSCOPY (EGD) WITH PROPOFOL;  Surgeon: Lin Landsman, MD;  Location: [facility] ENDOSCOPY;  Service: Gastroenterology;  Laterality: N/A;  . GALLBLADDER SURGERY      Prior to Admission medications   Medication Sig Start Date End Date Taking? Authorizing Provider  albuterol (PROVENTIL HFA;VENTOLIN HFA) 108 (90 Base) MCG/ACT inhaler Inhale 2 puffs into the lungs every 6 (six) hours as needed for wheezing or shortness of breath. 02/15/18  Yes Demetrios Loll, MD  apixaban (ELIQUIS) 5 MG TABS tablet Take 5 mg by mouth every 12 (twelve) hours. 04/20/18 08/09/18 Yes [provider]  atorvastatin (LIPITOR) 40 MG tablet Take 40 mg by mouth daily. 05/07/18 11/03/18 Yes [provider]  busPIRone (BUSPAR) 10 MG tablet Take 10 mg by mouth 3 (three) times daily. 04/20/18 11/03/18 Yes [provider]  dicyclomine (BENTYL) 10 MG capsule Take 10 mg by mouth 3 (three) times daily as needed for spasms. 05/11/18 05/11/19 Yes [provider]  fenofibrate 160 MG tablet Take 160 mg by mouth daily. 05/07/18 11/03/18 Yes [provider]  FLUoxetine (PROZAC) 20 MG capsule Take 60 mg by mouth daily. 06/12/18 12/09/18 Yes [provider]  hydrocortisone (ANUSOL-HC) 25 MG suppository Place 25 mg rectally 2 (two) times a day. 06/12/18  Yes [provider]  linaclotide Rolan Lipa) 145 MCG CAPS capsule Take 1 capsule (145 mcg total) by mouth daily before breakfast. 07/26/18  Yes Salary, Montell D, MD  Melatonin 3 MG TABS Take 6 mg by mouth at bedtime.   Yes  [provider]  mirtazapine (REMERON) 30 MG tablet Take 30 mg by mouth at bedtime. 05/19/18  Yes [provider]  omeprazole (PRILOSEC) 20 MG capsule Take 40 mg by mouth 2 (two) times daily. 05/07/18  Yes [provider]  Pancrelipase, Lip-Prot-Amyl, 24000-76000 units CPEP Take 2 capsules by mouth 3 (three) times daily  with meals.   Yes [provider]  pregabalin (LYRICA) 200 MG capsule Take 200 mg by mouth 3 (three) times daily. 05/07/18  Yes [provider]  prochlorperazine (COMPAZINE) 10 MG tablet Take 10 mg by mouth daily. 04/20/18  Yes [provider]  promethazine (PHENERGAN) 25 MG tablet Take 25 mg by mouth at bedtime as needed for nausea. 04/22/18  Yes [provider]  tiZANidine (ZANAFLEX) 4 MG tablet Take 4 mg by mouth 2 (two) times daily as needed for muscle spasms. 05/22/18 05/22/19 Yes [provider]  cetirizine (ZYRTEC) 10 MG tablet Take 10 mg by mouth daily.    [provider]    Allergies Precedex [dexmedetomidine hcl in nacl], Aspirin, Cefpodoxime, Clarithromycin, Doxycycline, Ferrous gluconate, Lactose intolerance (gi), Levofloxacin, Rizatriptan, Sulfa antibiotics, Topiramate, Venlafaxine, and Zofran [ondansetron hcl]  Family History  Problem Relation Age of Onset  . Diabetes Father   . Heart disease Father   . Kidney disease Father   . COPD Father   . Cancer Paternal Aunt   . Diabetes Paternal Uncle   . Heart disease Paternal Uncle     Social History Social History   Tobacco Use  . Smoking status: Current Every Day Smoker    Packs/day: 0.50    Types: Cigarettes  . Smokeless tobacco: Never Used  Substance Use Topics  . Alcohol use: Not Currently  . Drug use: Not Currently    Review of Systems Constitutional: No fever/chills Eyes: No visual changes. ENT: No sore throat. Cardiovascular: Denies chest pain. Respiratory: Denies shortness of breath. Gastrointestinal: Positive for right upper quadrant abdominal pain. Positive for nausea and vomiting.  Genitourinary: Negative for dysuria. Musculoskeletal: Negative for back pain. Skin: Negative for rash. Neurological: Negative for headaches, focal weakness or numbness.  ____________________________________________   PHYSICAL EXAM:  VITAL SIGNS: ED Triage Vitals  Enc  Vitals Group     BP 08/09/18 2330 (!) 141/87     Pulse Rate 08/09/18 2330 64     Resp 08/09/18 2330 18     Temp 08/09/18 2330 98.9 F (37.2 C)     Temp Source 08/09/18 2330 Oral     SpO2 08/09/18 2330 95 %     Weight 08/09/18 2323 195 lb 12.3 oz (88.8 kg)     Height 08/09/18 2323 5\' 7"  (1.702 m)     Head Circumference --      Peak Flow --      Pain Score 08/09/18 2322 10   Constitutional: Alert and oriented.  Eyes: Conjunctivae are normal.  ENT      Head: Normocephalic and atraumatic.      Nose: No congestion/rhinnorhea.      Mouth/Throat: Mucous membranes are moist.      Neck: No stridor. Hematological/Lymphatic/Immunilogical: No cervical lymphadenopathy. Cardiovascular: Normal rate, regular rhythm.  No murmurs, rubs, or gallops.  Respiratory: Normal respiratory effort without tachypnea nor retractions. Breath sounds are clear and equal bilaterally. No wheezes/rales/rhonchi. Gastrointestinal: Soft and tender to palpation in the right side of the abdomen. No rebound. No guarding.  Genitourinary: Deferred Musculoskeletal: Normal range of motion in all extremities. No lower extremity edema. Neurologic:  Normal  speech and language. No gross focal neurologic deficits are appreciated.  Skin:  Skin is warm, dry and intact. No rash noted. Psychiatric: Mood and affect are normal. Speech and behavior are normal. Patient exhibits appropriate insight and judgment.  ____________________________________________    LABS (pertinent positives/negatives)  UA clear, unremarkable Lipase 29 CMP wnl except k 3.3, glu 126, ca 8.7 CBC wbc 9.2, hgb 9.3, plt 399  ____________________________________________   EKG  None  ____________________________________________    RADIOLOGY  ABD 2 views Normal abdominal radiographs  ____________________________________________   PROCEDURES  Procedures  ____________________________________________   INITIAL IMPRESSION / ASSESSMENT AND PLAN  / ED COURSE  Pertinent labs & imaging results that were available during my care of the patient were reviewed by me and considered in my medical decision making (see chart for details).   Patient presented to the emergency department today with concerns for abdominal pain.  Patient states that she has a history of pancreatitis.  Lipase today normal.  Patient does not have any concerning leukocytosis.  Do not think any emergent imaging is necessary. Do wonder if patient is suffering either from acid reflux or IBS.  This point I think less likely pancreatitis.  Patient is status post cholecystectomy.  Will plan on discharging with further medications for acid reflux.   ____________________________________________   FINAL CLINICAL IMPRESSION(S) / ED DIAGNOSES  Final diagnoses:  Abdominal pain     Note: This dictation was prepared with Dragon dictation. Any transcriptional errors that result from this process are unintentional     Phineas SemenGoodman, Madeliene Tejera, MD 08/10/18 [phone]

## 2018-08-10 NOTE — Discharge Instructions (Signed)
Please seek medical attention for any high fevers, chest pain, shortness of breath, change in behavior, persistent vomiting, bloody stool or any other new or concerning symptoms.  

## 2018-08-10 NOTE — ED Notes (Signed)
Patient reports recent admission to hospital and states this is her pancreas acting up again.  Patient reports increased pain since yesterday.

## 2018-08-21 ENCOUNTER — Other Ambulatory Visit (INDEPENDENT_AMBULATORY_CARE_PROVIDER_SITE_OTHER): Payer: Self-pay | Admitting: Vascular Surgery

## 2018-08-21 DIAGNOSIS — I771 Stricture of artery: Secondary | ICD-10-CM

## 2018-08-28 ENCOUNTER — Other Ambulatory Visit (INDEPENDENT_AMBULATORY_CARE_PROVIDER_SITE_OTHER): Payer: Self-pay

## 2018-08-28 ENCOUNTER — Ambulatory Visit (INDEPENDENT_AMBULATORY_CARE_PROVIDER_SITE_OTHER): Payer: Self-pay | Admitting: Vascular Surgery

## 2018-09-22 ENCOUNTER — Encounter (INDEPENDENT_AMBULATORY_CARE_PROVIDER_SITE_OTHER): Payer: Self-pay | Admitting: Vascular Surgery

## 2019-01-22 ENCOUNTER — Other Ambulatory Visit: Payer: Self-pay

## 2019-01-22 ENCOUNTER — Inpatient Hospital Stay
Admission: EM | Admit: 2019-01-22 | Discharge: 2019-01-27 | DRG: 440 | Disposition: A | Discharge status: Home / Self Care | Payer: Medicaid Other | Attending: Internal Medicine | Admitting: Internal Medicine

## 2019-01-22 ENCOUNTER — Encounter: Payer: Self-pay | Admitting: Emergency Medicine

## 2019-01-22 DIAGNOSIS — Z90710 Acquired absence of both cervix and uterus: Secondary | ICD-10-CM

## 2019-01-22 DIAGNOSIS — I160 Hypertensive urgency: Secondary | ICD-10-CM | POA: Diagnosis present

## 2019-01-22 DIAGNOSIS — F419 Anxiety disorder, unspecified: Secondary | ICD-10-CM | POA: Diagnosis present

## 2019-01-22 DIAGNOSIS — E739 Lactose intolerance, unspecified: Secondary | ICD-10-CM | POA: Diagnosis present

## 2019-01-22 DIAGNOSIS — Z79899 Other long term (current) drug therapy: Secondary | ICD-10-CM

## 2019-01-22 DIAGNOSIS — Z833 Family history of diabetes mellitus: Secondary | ICD-10-CM | POA: Diagnosis not present

## 2019-01-22 DIAGNOSIS — K219 Gastro-esophageal reflux disease without esophagitis: Secondary | ICD-10-CM | POA: Diagnosis present

## 2019-01-22 DIAGNOSIS — K858 Other acute pancreatitis without necrosis or infection: Secondary | ICD-10-CM | POA: Diagnosis present

## 2019-01-22 DIAGNOSIS — Z7901 Long term (current) use of anticoagulants: Secondary | ICD-10-CM

## 2019-01-22 DIAGNOSIS — I10 Essential (primary) hypertension: Secondary | ICD-10-CM | POA: Diagnosis present

## 2019-01-22 DIAGNOSIS — R112 Nausea with vomiting, unspecified: Secondary | ICD-10-CM | POA: Diagnosis present

## 2019-01-22 DIAGNOSIS — Z20828 Contact with and (suspected) exposure to other viral communicable diseases: Secondary | ICD-10-CM | POA: Diagnosis present

## 2019-01-22 DIAGNOSIS — K859 Acute pancreatitis without necrosis or infection, unspecified: Secondary | ICD-10-CM | POA: Diagnosis not present

## 2019-01-22 DIAGNOSIS — E781 Pure hyperglyceridemia: Secondary | ICD-10-CM | POA: Diagnosis present

## 2019-01-22 DIAGNOSIS — G894 Chronic pain syndrome: Secondary | ICD-10-CM | POA: Diagnosis present

## 2019-01-22 DIAGNOSIS — F17210 Nicotine dependence, cigarettes, uncomplicated: Secondary | ICD-10-CM | POA: Diagnosis present

## 2019-01-22 DIAGNOSIS — K861 Other chronic pancreatitis: Secondary | ICD-10-CM | POA: Diagnosis present

## 2019-01-22 DIAGNOSIS — E119 Type 2 diabetes mellitus without complications: Secondary | ICD-10-CM

## 2019-01-22 DIAGNOSIS — R1010 Upper abdominal pain, unspecified: Secondary | ICD-10-CM

## 2019-01-22 LAB — COMPREHENSIVE METABOLIC PANEL
ALT: 19 U/L (ref 0–44)
AST: 25 U/L (ref 15–41)
Albumin: 4.4 g/dL (ref 3.5–5.0)
Alkaline Phosphatase: 48 U/L (ref 38–126)
Anion gap: 12 (ref 5–15)
BUN: 14 mg/dL (ref 6–20)
CO2: 24 mmol/L (ref 22–32)
Calcium: 9.2 mg/dL (ref 8.9–10.3)
Chloride: 101 mmol/L (ref 98–111)
Creatinine, Ser: 1.05 mg/dL — ABNORMAL HIGH (ref 0.44–1.00)
GFR calc Af Amer: >60 mL/min (ref >60)
GFR calc non Af Amer: >60 mL/min (ref >60)
Glucose, Bld: 175 mg/dL — ABNORMAL HIGH (ref 70–99)
Potassium: 4 mmol/L (ref 3.5–5.1)
Sodium: 137 mmol/L (ref 135–145)
Total Bilirubin: 0.6 mg/dL (ref 0.3–1.2)
Total Protein: 7.9 g/dL (ref 6.5–8.1)

## 2019-01-22 LAB — CBC
HCT: 36.1 % (ref 36.0–46.0)
Hemoglobin: 11 g/dL — ABNORMAL LOW (ref 12.0–15.0)
MCH: 22.9 pg — ABNORMAL LOW (ref 26.0–34.0)
MCHC: 30.5 g/dL (ref 30.0–36.0)
MCV: 75.1 fL — ABNORMAL LOW (ref 80.0–100.0)
Platelets: 317 10*3/uL (ref 150–400)
RBC: 4.81 MIL/uL (ref 3.87–5.11)
RDW: 16.2 % — ABNORMAL HIGH (ref 11.5–15.5)
WBC: 11.9 10*3/uL — ABNORMAL HIGH (ref 4.0–10.5)
nRBC: 0 % (ref 0.0–0.2)

## 2019-01-22 LAB — URINALYSIS, COMPLETE (UACMP) WITH MICROSCOPIC
Bilirubin Urine: NEGATIVE
Glucose, UA: NEGATIVE mg/dL
Hgb urine dipstick: NEGATIVE
Ketones, ur: NEGATIVE mg/dL
Leukocytes,Ua: NEGATIVE
Nitrite: NEGATIVE
Protein, ur: NEGATIVE mg/dL
Specific Gravity, Urine: 1.015 (ref 1.005–1.030)
pH: 7 (ref 5.0–8.0)

## 2019-01-22 LAB — LIPASE, BLOOD: Lipase: 195 U/L — ABNORMAL HIGH (ref 11–51)

## 2019-01-22 MED ORDER — SODIUM CHLORIDE 0.9 % IV BOLUS
1000.0000 mL | Freq: Once | INTRAVENOUS | Status: AC
  Administered 2019-01-22: 1000 mL via INTRAVENOUS

## 2019-01-22 MED ORDER — FENTANYL CITRATE (PF) 100 MCG/2ML IJ SOLN
50.0000 ug | INTRAMUSCULAR | Status: DC | PRN
  Administered 2019-01-22: 50 ug via INTRAVENOUS

## 2019-01-22 MED ORDER — FENTANYL CITRATE (PF) 100 MCG/2ML IJ SOLN
INTRAMUSCULAR | Status: AC
  Filled 2019-01-22: qty 2

## 2019-01-22 MED ORDER — DROPERIDOL 2.5 MG/ML IJ SOLN
2.5000 mg | Freq: Once | INTRAMUSCULAR | Status: AC
  Administered 2019-01-22: 2.5 mg via INTRAVENOUS
  Filled 2019-01-22: qty 2

## 2019-01-22 MED ORDER — HYDROMORPHONE HCL 1 MG/ML IJ SOLN
1.0000 mg | Freq: Once | INTRAMUSCULAR | Status: AC
  Administered 2019-01-22: 1 mg via INTRAVENOUS
  Filled 2019-01-22: qty 1

## 2019-01-22 NOTE — ED Notes (Signed)
Patient c/o upper abdominal pain radiating to back. Patient reports hx of pancreatitis 2 months ago. Patient reports hx of cholecystectomy. Patient co nausea and diarrhea   Patient currently c/o 10 of 10 upper abdominal pain. Patient given pain medication in triage - patient reports falling asleep after medication administration.  Patient provided with phone to call her husband.

## 2019-01-22 NOTE — H&P (Signed)
History and Physical   Terri Berry MHD:[id number] DOB: [birth year] DOA: 01/22/2019  Referring MD/NP/PA: Dr. Dolores Frame  PCP: Thomes Dinning, MD   Outpatient Specialists: None  Patient coming from: Home  Chief Complaint: Abdominal pain  HPI: Bevely Berry is a 43 y.o. 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  Pain has been off the last 2 to 3 days.  Associated with intractable nausea with vomiting.  Denies any fever or chills.  Denied any sick contacts.  Denied any bright red blood per rectum.  Denied any melena or hematemesis.  Patient was seen and evaluated in the ER.  Pain is said to be consistent with previous pancreatitis.  She was evaluated and found to have elevated lipase.  Patient has been diagnosed with acute on chronic pancreatitis. .  ED Course: Temperature 98.3, blood pressure 199/120, pulse 67 respiratory rate 20 oxygen sat 99% room air.  White count 11.9 hemoglobin 11.1 platelet count of 372.  Triglyceride today is 272.  Sodium 137 potassium 4.0 chloride 100 CO2 24 glucose 175 BUN 14 creatinine 1.05 and calcium 9.2.  Lipase 195.  Acute abdominal series showed no acute findings.  Patient initiated on pain management with IV fluids and being admitted for treatment.  Review of Systems: As per HPI otherwise 10 point review of systems negative.    Past Medical History:  Diagnosis Date  . Allergy   . Anxiety   . Depression   . GERD (gastroesophageal reflux disease)   . Hypertension   . IBS (irritable bowel syndrome)   . Pancreatitis     Past Surgical History:  Procedure Laterality Date  . ABDOMINAL HYSTERECTOMY    . COLONOSCOPY WITH PROPOFOL N/A 07/24/2018   Procedure: COLONOSCOPY WITH PROPOFOL;  Surgeon:  Toney Reil, MD;  Location[facility] ENDOSCOPY;  Service: Gastroenterology;  Laterality: N/A;  . ESOPHAGOGASTRODUODENOSCOPY (EGD) WITH PROPOFOL N/A 07/24/2018   Procedure: ESOPHAGOGASTRODUODENOSCOPY (EGD) WITH PROPOFOL;  Surgeon: Toney Reil, MD;  Location: [facility] ENDOSCOPY;  Service: Gastroenterology;  Laterality: N/A;  . GALLBLADDER SURGERY       reports that she has been smoking cigarettes. She has been smoking about 0.50 packs per day. She has never used smokeless tobacco. She reports previous alcohol use. She reports previous drug use.  Allergies  Allergen Reactions  . Precedex [Dexmedetomidine Hcl In Nacl] Other (See Comments)    Significant Bradycardia  . Aspirin     Not indicated due to other anticoagulant use.  Marland Kitchen Cefpodoxime     Tolerates augmentin and Keflex   . Clarithromycin   . Doxycycline   . Ferrous Gluconate   . Lactose Intolerance (Gi)   . Levofloxacin   . Rizatriptan   . Sulfa Antibiotics   . Topiramate   . Venlafaxine   . Zofran [Ondansetron Hcl]     Family History  Problem Relation Age of Onset  . Diabetes Father   . Heart disease Father   . Kidney disease Father   . COPD Father   . Cancer Paternal Aunt   . Diabetes Paternal Uncle   . Heart disease Paternal Uncle      Prior to Admission medications   Medication Sig Start Date End Date Taking? Authorizing Provider  albuterol (PROVENTIL HFA;VENTOLIN HFA) 108 (90 Base) MCG/ACT inhaler Inhale 2  puffs into the lungs every 6 (six) hours as needed for wheezing or shortness of breath. 02/15/18   Shaune Pollack, MD  apixaban (ELIQUIS) 5 MG TABS tablet Take 5 mg by mouth every 12 (twelve) hours. 04/20/18 08/09/18  [provider]  cetirizine (ZYRTEC) 10 MG tablet Take 10 mg by mouth daily.    [provider]  dicyclomine (BENTYL) 10 MG capsule Take 10 mg by mouth 3 (three) times daily as needed for spasms. 05/11/18 05/11/19  [provider]  hydrocortisone (ANUSOL-HC) 25 MG suppository  Place 25 mg rectally 2 (two) times a day. 06/12/18   [provider]  linaclotide Karlene Einstein) 145 MCG CAPS capsule Take 1 capsule (145 mcg total) by mouth daily before breakfast. 07/26/18   Salary, Evelena Asa, MD  Melatonin 3 MG TABS Take 6 mg by mouth at bedtime.    [provider]  mirtazapine (REMERON) 30 MG tablet Take 30 mg by mouth at bedtime. 05/19/18   [provider]  omeprazole (PRILOSEC) 20 MG capsule Take 40 mg by mouth 2 (two) times daily. 05/07/18   [provider]  Pancrelipase, Lip-Prot-Amyl, 24000-76000 units CPEP Take 2 capsules by mouth 3 (three) times daily with meals.    [provider]  pregabalin (LYRICA) 200 MG capsule Take 200 mg by mouth 3 (three) times daily. 05/07/18   [provider]  prochlorperazine (COMPAZINE) 10 MG tablet Take 10 mg by mouth daily. 04/20/18   [provider]  promethazine (PHENERGAN) 25 MG tablet Take 25 mg by mouth at bedtime as needed for nausea. 04/22/18   [provider]  sucralfate (CARAFATE) 1 g tablet Take 1 tablet (1 g total) by mouth 4 (four) times daily. 08/10/18   Phineas Semen, MD  tiZANidine (ZANAFLEX) 4 MG tablet Take 4 mg by mouth 2 (two) times daily as needed for muscle spasms. 05/22/18 05/22/19  [provider]    Physical Exam: Vitals:   01/22/19 1740 01/22/19 1744 01/22/19 1745 01/22/19 2330  BP:   (!) 170/110 103/90  Pulse:  62  60  Resp:  20  16  Temp:  98 F (36.7 C)    TempSrc:  Oral    SpO2:  97%  99%  Weight: 90.7 kg     Height:  (1.702 m)         Constitutional: Acutely ill looking, very anxious Vitals:   01/22/19 1740 01/22/19 1744 01/22/19 1745 01/22/19 2330  BP:   (!) 170/110 103/90  Pulse:  62  60  Resp:  20  16  Temp:  98 F (36.7 C)    TempSrc:  Oral    SpO2:  97%  99%  Weight: 90.7 kg     Height:  (1.702 m)      Eyes: PERRL, lids and conjunctivae normal ENMT: Mucous membranes are dry posterior pharynx clear of any  exudate or lesions.Normal dentition.  Neck: normal, supple, no masses, no thyromegaly Respiratory: clear to auscultation bilaterally, no wheezing, no crackles. Normal respiratory effort. No accessory muscle use.  Cardiovascular: Regular rate and rhythm, no murmurs / rubs / gallops. No extremity edema. 2+ pedal pulses. No carotid bruits.  Abdomen: Diffuse tenderness, no masses palpated. No hepatosplenomegaly. Bowel sounds positive.  Musculoskeletal: no clubbing / cyanosis. No joint deformity upper and lower extremities. Good ROM, no contractures. Normal muscle tone.  Skin: no rashes, lesions, ulcers. No induration Neurologic: CN 2-12 grossly intact. Sensation intact, DTR normal. Strength 5/5 in all 4.  Psychiatric: Normal judgment and insight. Alert and oriented x 3. Normal mood.     Labs on Admission: I have personally reviewed following labs and imaging studies  CBC: Recent Labs  Lab 01/22/19 1741  WBC 11.9*  HGB 11.0*  HCT 36.1  MCV 75.1*  PLT 469   Basic Metabolic Panel: Recent Labs  Lab 01/22/19 1741  NA 137  K 4.0  CL 101  CO2 24  GLUCOSE 175*  BUN 14  CREATININE 1.05*  CALCIUM 9.2   GFR: Estimated Creatinine Clearance: 78.2 mL/min (A) (by C-G formula based on SCr of 1.05 mg/dL (H)). Liver Function Tests: Recent Labs  Lab 01/22/19 1741  AST 25  ALT 19  ALKPHOS 48  BILITOT 0.6  PROT 7.9  ALBUMIN 4.4   Recent Labs  Lab 01/22/19 1741  LIPASE 195*   No results for input(s): AMMONIA in the last 168 hours. Coagulation Profile: No results for input(s): INR, PROTIME in the last 168 hours. Cardiac Enzymes: No results for input(s): CKTOTAL, CKMB, CKMBINDEX, TROPONINI in the last 168 hours. BNP (last 3 results) No results for input(s): PROBNP in the last 8760 hours. HbA1C: No results for input(s): HGBA1C in the last 72 hours. CBG: No results for input(s): GLUCAP in the last 168 hours. Lipid Profile: No results for input(s): CHOL, HDL, LDLCALC, TRIG,  CHOLHDL, LDLDIRECT in the last 72 hours. Thyroid Function Tests: No results for input(s): TSH, T4TOTAL, FREET4, T3FREE, THYROIDAB in the last 72 hours. Anemia Panel: No results for input(s): VITAMINB12, FOLATE, FERRITIN, TIBC, IRON, RETICCTPCT in the last 72 hours. Urine analysis:    Component Value Date/Time   COLORURINE YELLOW (A) 01/22/2019 2258   APPEARANCEUR CLEAR (A) 01/22/2019 2258   APPEARANCEUR Clear 03/10/2018 1827   LABSPEC 1.015 01/22/2019 2258   PHURINE 7.0 01/22/2019 2258   GLUCOSEU NEGATIVE 01/22/2019 2258   HGBUR NEGATIVE 01/22/2019 2258   BILIRUBINUR NEGATIVE 01/22/2019 2258   BILIRUBINUR Negative 03/10/2018 1827   KETONESUR NEGATIVE 01/22/2019 2258   PROTEINUR NEGATIVE 01/22/2019 2258   NITRITE NEGATIVE 01/22/2019 2258   LEUKOCYTESUR NEGATIVE 01/22/2019 2258   Sepsis Labs: @LABRCNTIP (procalcitonin:4,lacticidven:4) )No results found for this or any previous visit (from the past 240 hour(s)).   Radiological Exams on Admission: No results found.  EKG: Independently reviewed.  It shows sinus bradycardia with a rate of 53, no significant ST changes  Assessment/Plan Principal Problem:   Acute on chronic pancreatitis (HCC) Active Problems:   GERD (gastroesophageal reflux disease)   HTN (hypertension)   Diabetes (HCC)   Intractable nausea and vomiting     #1 acute on chronic pancreatitis: Secondary to recurrent hypertriglyceridemia.  Admit the patient to monitored bed.  Pain management, hydration, resume statin when better.  Bowel rest.  May consider GI consult in the morning.  #2 hypertensive urgency: Initiate hydralazine and labetalol as needed IV.  #3 hypertriglyceridemia: Chronic.  Once pancreatitis resolves resume lowering of triglycerides.  May consider endocrinologist.  #4 diabetes: Initiate sliding scale insulin and monitor  #5 GERD: Continue with PPIs  #6 leukocytosis: Most likely due to pancreatitis.   DVT prophylaxis: Lovenox Code Status:  Full code Family Communication: No family at bedside Disposition Plan: Home Consults called: None Admission status: Inpatient  Severity of Illness: The appropriate patient status for this patient is INPATIENT. Inpatient status is judged to be reasonable and necessary in order to provide the required intensity of service to ensure the patient's safety. The patient's presenting symptoms, physical exam findings, and initial radiographic and laboratory  data in the context of their chronic comorbidities is felt to place them at high risk for further clinical deterioration. Furthermore, it is not anticipated that the patient will be medically stable for discharge from the hospital within 2 midnights of admission. The following factors support the patient status of inpatient.   " The patient's presenting symptoms include abdominal pain. " The worrisome physical exam findings include abdominal tenderness. " The initial radiographic and laboratory data are worrisome because of elevated lipase and triglycerides. " The chronic co-morbidities include diabetes with hypertension.   * I certify that at the point of admission it is my clinical judgment that the patient will require inpatient hospital care spanning beyond 2 midnights from the point of admission due to high intensity of service, high risk for further deterioration and high frequency of surveillance required.Lonia Blood*    Jshon Ibe,LAWAL MD [facility] Pager [phone]  If 7PM-7AM, please contact night-coverage [email or web address] Password [email or web address]  01/22/2019, 11:51 PM

## 2019-01-22 NOTE — ED Triage Notes (Signed)
Hx of pancreatitis, having abdominal pain that feels the same.  No vomiting, + nausea.  CBG 198 with EMS, no hx diabetes.  Pain to RUQ.  Moaning in pain.  Do diarrhea or fevers.

## 2019-01-22 NOTE — ED Provider Notes (Signed)
[facility] Emergency Department Provider Note   ____________________________________________   First MD Initiated Contact with Patient 01/22/19 2302     (approximate)  I have reviewed the triage vital signs and the nursing notes.   HISTORY  Chief Complaint Abdominal Pain    HPI Terri Berry is a 43 y.o. female who presents to the ED from home with a chief complaint of abdominal pain.  Patient has a past medical history of pancreatitis secondary to hypertriglyceridemia, hypertension, chronic pain syndrome.  Having abdominal pain x2 days, worse today.  Associated with intense nausea and dry heaving.  Saw her doctor 2 days ago for headache, fatigue, abdominal pain, mouth ulcers and diarrhea.  Had Covid PCR test which was negative.  Denies fever, chest pain, shortness of breath.       Past Medical History:  Diagnosis Date  . Allergy   . Anxiety   . Depression   . GERD (gastroesophageal reflux disease)   . Hypertension   . IBS (irritable bowel syndrome)   . Pancreatitis     Patient Active Problem List   Diagnosis Date Noted  . Iron deficiency anemia due to chronic blood loss   . Intractable nausea and vomiting 07/21/2018  . Acute renal failure (HCC) 07/03/2018  . Hypotension 05/26/2018  . Subclavian arterial stenosis (HCC) 05/26/2018  . Diabetes (HCC) 05/26/2018  . GERD (gastroesophageal reflux disease) 05/25/2018  . HTN (hypertension) 05/25/2018  . Anxiety 05/25/2018  . AKI (acute kidney injury) (HCC) 05/25/2018  . Distended abdomen   . Acute pancreatitis 03/18/2018  . Acute hypoxemic respiratory failure (HCC) 02/09/2018  . Overdose 11/28/2017    Past Surgical History:  Procedure Laterality Date  . ABDOMINAL HYSTERECTOMY    . COLONOSCOPY WITH PROPOFOL N/A 07/24/2018   Procedure: COLONOSCOPY WITH PROPOFOL;  Surgeon: Toney Reil, MD;  Location: [facility] ENDOSCOPY;  Service: Gastroenterology;  Laterality: N/A;  .  ESOPHAGOGASTRODUODENOSCOPY (EGD) WITH PROPOFOL N/A 07/24/2018   Procedure: ESOPHAGOGASTRODUODENOSCOPY (EGD) WITH PROPOFOL;  Surgeon: Toney Reil, MD;  Location: [facility] ENDOSCOPY;  Service: Gastroenterology;  Laterality: N/A;  . GALLBLADDER SURGERY      Prior to Admission medications   Medication Sig Start Date End Date Taking? Authorizing Provider  albuterol (PROVENTIL HFA;VENTOLIN HFA) 108 (90 Base) MCG/ACT inhaler Inhale 2 puffs into the lungs every 6 (six) hours as needed for wheezing or shortness of breath. 02/15/18   Shaune Pollack, MD  apixaban (ELIQUIS) 5 MG TABS tablet Take 5 mg by mouth every 12 (twelve) hours. 04/20/18 08/09/18  [provider]  cetirizine (ZYRTEC) 10 MG tablet Take 10 mg by mouth daily.    [provider]  dicyclomine (BENTYL) 10 MG capsule Take 10 mg by mouth 3 (three) times daily as needed for spasms. 05/11/18 05/11/19  [provider]  hydrocortisone (ANUSOL-HC) 25 MG suppository Place 25 mg rectally 2 (two) times a day. 06/12/18   [provider]  linaclotide Karlene Einstein) 145 MCG CAPS capsule Take 1 capsule (145 mcg total) by mouth daily before breakfast. 07/26/18   Salary, Evelena Asa, MD  Melatonin 3 MG TABS Take 6 mg by mouth at bedtime.    [provider]  mirtazapine (REMERON) 30 MG tablet Take 30 mg by mouth at bedtime. 05/19/18   [provider]  omeprazole (PRILOSEC) 20 MG capsule Take 40 mg by mouth 2 (two) times daily. 05/07/18   [provider]  Pancrelipase, Lip-Prot-Amyl, 24000-76000 units CPEP Take 2 capsules by mouth 3 (three) times daily  with meals.    [provider]  pregabalin (LYRICA) 200 MG capsule Take 200 mg by mouth 3 (three) times daily. 05/07/18   [provider]  prochlorperazine (COMPAZINE) 10 MG tablet Take 10 mg by mouth daily. 04/20/18   [provider]  promethazine (PHENERGAN) 25 MG tablet Take 25 mg by mouth at bedtime as needed for nausea. 04/22/18   [provider]  sucralfate (CARAFATE) 1 g tablet Take 1 tablet (1 g total) by mouth 4 (four) times daily. 08/10/18   Phineas Semen, MD  tiZANidine (ZANAFLEX) 4 MG tablet Take 4 mg by mouth 2 (two) times daily as needed for muscle spasms. 05/22/18 05/22/19  [provider]    Allergies Precedex [dexmedetomidine hcl in nacl], Aspirin, Cefpodoxime, Clarithromycin, Doxycycline, Ferrous gluconate, Lactose intolerance (gi), Levofloxacin, Rizatriptan, Sulfa antibiotics, Topiramate, Venlafaxine, and Zofran [ondansetron hcl]  Family History  Problem Relation Age of Onset  . Diabetes Father   . Heart disease Father   . Kidney disease Father   . COPD Father   . Cancer Paternal Aunt   . Diabetes Paternal Uncle   . Heart disease Paternal Uncle     Social History Social History   Tobacco Use  . Smoking status: Current Every Day Smoker    Packs/day: 0.50    Types: Cigarettes  . Smokeless tobacco: Never Used  Substance Use Topics  . Alcohol use: Not Currently  . Drug use: Not Currently    Review of Systems  Constitutional: No fever/chills Eyes: No visual changes. ENT: No sore throat. Cardiovascular: Denies chest pain. Respiratory: Denies shortness of breath. Gastrointestinal: Positive for abdominal pain and nausea, no vomiting.  No diarrhea.  No constipation. Genitourinary: Negative for dysuria. Musculoskeletal: Negative for back pain. Skin: Negative for rash. Neurological: Negative for headaches, focal weakness or numbness.   ____________________________________________   PHYSICAL EXAM:  VITAL SIGNS: ED Triage Vitals  Enc Vitals Group     BP 01/22/19 1745 (!) 170/110     Pulse Rate 01/22/19 1744 62     Resp 01/22/19 1744 20     Temp 01/22/19 1744 98 F (36.7 C)     Temp Source 01/22/19 1744 Oral     SpO2 01/22/19 1744 97 %     Weight 01/22/19 1740 200 lb (90.7 kg)     Height 01/22/19 1740 5\' 7"  (1.702 m)     Head Circumference --      Peak Flow --      Pain  Score 01/22/19 1739 10     Pain Loc --      Pain Edu? --      Excl. in GC? --     Constitutional: Alert and oriented.  Uncomfortable appearing and in mild to moderate acute distress. Eyes: Conjunctivae are normal. PERRL. EOMI. Head: Atraumatic. Nose: No congestion/rhinnorhea. Mouth/Throat: Mucous membranes are moist.  Oropharynx non-erythematous. Neck: No stridor.   Cardiovascular: Normal rate, regular rhythm. Grossly normal heart sounds.  Good peripheral circulation. Respiratory: Normal respiratory effort.  No retractions. Lungs CTAB. Gastrointestinal: Soft and moderately tender to palpation upper abdomen without rebound or guarding. No distention. No abdominal bruits. No CVA tenderness. Musculoskeletal: No lower extremity tenderness nor edema.  No joint effusions. Neurologic:  Normal speech and language. No gross focal neurologic deficits are appreciated. No gait instability. Skin:  Skin is warm, dry and intact. No rash noted. Psychiatric: Mood and affect are normal. Speech and behavior are normal.  ____________________________________________   LABS (all labs ordered are  listed, but only abnormal results are displayed)  Labs Reviewed  LIPASE, BLOOD - Abnormal; Notable for the following components:      Result Value   Lipase 195 (*)    All other components within normal limits  COMPREHENSIVE METABOLIC PANEL - Abnormal; Notable for the following components:   Glucose, Bld 175 (*)    Creatinine, Ser 1.05 (*)    All other components within normal limits  CBC - Abnormal; Notable for the following components:   WBC 11.9 (*)    Hemoglobin 11.0 (*)    MCV 75.1 (*)    MCH 22.9 (*)    RDW 16.2 (*)    All other components within normal limits  URINALYSIS, COMPLETE (UACMP) WITH MICROSCOPIC - Abnormal; Notable for the following components:   Color, Urine YELLOW (*)    APPearance CLEAR (*)    Bacteria, UA RARE (*)    All other components within normal limits    ____________________________________________  EKG  ED ECG REPORT I, Devona Holmes J, the attending physician, personally viewed and interpreted this ECG.   Date: 01/22/2019  EKG Time: 1759  Rate: 83  Rhythm: normal EKG, normal sinus rhythm  Axis: Normal  Intervals: Unremarkable QTC  ST&T Change: Nonspecific  ____________________________________________  RADIOLOGY  ED MD interpretation: None  Official radiology report(s): No results found.  ____________________________________________   PROCEDURES  Procedure(s) performed (including Critical Care):  Procedures  CRITICAL CARE Performed by: Paulette Blanch   Total critical care time: 30 minutes  Critical care time was exclusive of separately billable procedures and treating other patients.  Critical care was necessary to treat or prevent imminent or life-threatening deterioration.  Critical care was time spent personally by me on the following activities: development of treatment plan with patient and/or surrogate as well as nursing, discussions with consultants, evaluation of patient's response to treatment, examination of patient, obtaining history from patient or surrogate, ordering and performing treatments and interventions, ordering and review of laboratory studies, ordering and review of radiographic studies, pulse oximetry and re-evaluation of patient's condition.  ____________________________________________   INITIAL IMPRESSION / ASSESSMENT AND PLAN / ED COURSE  As part of my medical decision making, I reviewed the following data within the [place] History obtained from family, Nursing notes reviewed and incorporated, Labs reviewed, EKG interpreted, Old chart reviewed, Discussed with admitting physician and Notes from prior ED visits     Brittain Dreese was evaluated in Emergency Department on 01/22/2019 for the symptoms described in the history of present illness. She was evaluated in the  context of the global COVID-19 pandemic, which necessitated consideration that the patient might be at risk for infection with the SARS-CoV-2 virus that causes COVID-19. Institutional protocols and algorithms that pertain to the evaluation of patients at risk for COVID-19 are in a state of rapid change based on information released by regulatory bodies including the CDC and federal and state organizations. These policies and algorithms were followed during the patient's care in the ED.    43 year old female who presents with upper abdominal pain, history of pancreatitis secondary to hypertriglyceridemia; status post cholecystectomy. Differential diagnosis includes, but is not limited to, biliary disease (biliary colic, acute cholecystitis, cholangitis, choledocholithiasis, etc), intrathoracic causes for epigastric abdominal pain including ACS, gastritis, duodenitis, pancreatitis, small bowel or large bowel obstruction, abdominal aortic aneurysm, hernia, and ulcer(s).  Updated patient's husband by telephone at her request.  Laboratory results remarkable for elevated lipase.  Despite receiving IV fentanyl and Dilaudid earlier in the  evening, patient is still complaining of severe pain.  Will initiate IV fluid resuscitation, administer IV droperidol and discuss with hospitalist services to evaluate patient in the emergency department for admission.      ____________________________________________   FINAL CLINICAL IMPRESSION(S) / ED DIAGNOSES  Final diagnoses:  Pain of upper abdomen  Acute pancreatitis, unspecified complication status, unspecified pancreatitis type     ED Discharge Orders    None       Note:  This document was prepared using Dragon voice recognition software and may include unintentional dictation errors.   Irean HongSung, Falynn Ailey J, MD 01/23/19 [phone]

## 2019-01-23 ENCOUNTER — Other Ambulatory Visit: Payer: Self-pay

## 2019-01-23 ENCOUNTER — Encounter: Payer: Self-pay | Admitting: Internal Medicine

## 2019-01-23 ENCOUNTER — Inpatient Hospital Stay: Payer: Medicaid Other

## 2019-01-23 LAB — HIV ANTIBODY (ROUTINE TESTING W REFLEX): HIV Screen 4th Generation wRfx: NONREACTIVE

## 2019-01-23 LAB — COMPREHENSIVE METABOLIC PANEL
ALT: 18 U/L (ref 0–44)
AST: 18 U/L (ref 15–41)
Albumin: 4.1 g/dL (ref 3.5–5.0)
Alkaline Phosphatase: 54 U/L (ref 38–126)
Anion gap: 12 (ref 5–15)
BUN: 14 mg/dL (ref 6–20)
CO2: 24 mmol/L (ref 22–32)
Calcium: 9.4 mg/dL (ref 8.9–10.3)
Chloride: 100 mmol/L (ref 98–111)
Creatinine, Ser: 0.95 mg/dL (ref 0.44–1.00)
GFR calc Af Amer: >60 mL/min (ref >60)
GFR calc non Af Amer: >60 mL/min (ref >60)
Glucose, Bld: 154 mg/dL — ABNORMAL HIGH (ref 70–99)
Potassium: 4.1 mmol/L (ref 3.5–5.1)
Sodium: 136 mmol/L (ref 135–145)
Total Bilirubin: 0.7 mg/dL (ref 0.3–1.2)
Total Protein: 7.6 g/dL (ref 6.5–8.1)

## 2019-01-23 LAB — LIPID PANEL
Cholesterol: 202 mg/dL — ABNORMAL HIGH (ref 0–200)
HDL: 33 mg/dL — ABNORMAL LOW (ref >40)
LDL Cholesterol: 115 mg/dL — ABNORMAL HIGH (ref 0–99)
Total CHOL/HDL Ratio: 6.1 RATIO
Triglycerides: 272 mg/dL — ABNORMAL HIGH (ref <150)
VLDL: 54 mg/dL — ABNORMAL HIGH (ref 0–40)

## 2019-01-23 LAB — CBC
HCT: 36.8 % (ref 36.0–46.0)
Hemoglobin: 11.2 g/dL — ABNORMAL LOW (ref 12.0–15.0)
MCH: 22.8 pg — ABNORMAL LOW (ref 26.0–34.0)
MCHC: 30.4 g/dL (ref 30.0–36.0)
MCV: 74.9 fL — ABNORMAL LOW (ref 80.0–100.0)
Platelets: 336 10*3/uL (ref 150–400)
RBC: 4.91 MIL/uL (ref 3.87–5.11)
RDW: 16 % — ABNORMAL HIGH (ref 11.5–15.5)
WBC: 12.9 10*3/uL — ABNORMAL HIGH (ref 4.0–10.5)
nRBC: 0 % (ref 0.0–0.2)

## 2019-01-23 LAB — GLUCOSE, CAPILLARY
Glucose-Capillary: 133 mg/dL — ABNORMAL HIGH (ref 70–99)
Glucose-Capillary: 139 mg/dL — ABNORMAL HIGH (ref 70–99)
Glucose-Capillary: 125 mg/dL — ABNORMAL HIGH (ref 70–99)
Glucose-Capillary: 113 mg/dL — ABNORMAL HIGH (ref 70–99)

## 2019-01-23 LAB — HEMOGLOBIN A1C
Hgb A1c MFr Bld: 7.8 % — ABNORMAL HIGH (ref 4.8–5.6)
Mean Plasma Glucose: 177.16 mg/dL

## 2019-01-23 LAB — SARS CORONAVIRUS 2 (TAT 6-24 HRS): SARS Coronavirus 2: NEGATIVE

## 2019-01-23 LAB — LIPASE, BLOOD: Lipase: 288 U/L — ABNORMAL HIGH (ref 11–51)

## 2019-01-23 LAB — GLUCOSE, POCT (MANUAL RESULT ENTRY): POC Glucose: 125 mg/dl — AB (ref 70–99)

## 2019-01-23 MED ORDER — PROMETHAZINE HCL 25 MG PO TABS
12.5000 mg | ORAL_TABLET | Freq: Four times a day (QID) | ORAL | Status: DC | PRN
  Filled 2019-01-23: qty 1

## 2019-01-23 MED ORDER — MORPHINE SULFATE (PF) 2 MG/ML IV SOLN
2.0000 mg | INTRAVENOUS | Status: DC | PRN
  Administered 2019-01-24 – 2019-01-25 (×4): 2 mg via INTRAVENOUS
  Filled 2019-01-23 (×4): qty 1

## 2019-01-23 MED ORDER — IOHEXOL 300 MG/ML  SOLN: 100.0000 mL | Freq: Once | INTRAMUSCULAR | Status: DC | PRN

## 2019-01-23 MED ORDER — HYDROMORPHONE HCL 1 MG/ML IJ SOLN
1.0000 mg | INTRAMUSCULAR | Status: DC | PRN
  Administered 2019-01-23: 1 mg via INTRAVENOUS
  Filled 2019-01-23: qty 1

## 2019-01-23 MED ORDER — LACTATED RINGERS IV SOLN: INTRAVENOUS | Status: DC

## 2019-01-23 MED ORDER — INSULIN ASPART 100 UNIT/ML ~~LOC~~ SOLN
0.0000 [IU] | Freq: Every day | SUBCUTANEOUS | Status: DC
  Administered 2019-01-25: 3 [IU] via SUBCUTANEOUS
  Filled 2019-01-23: qty 1

## 2019-01-23 MED ORDER — LABETALOL HCL 5 MG/ML IV SOLN: 10.0000 mg | INTRAVENOUS | Status: DC | PRN

## 2019-01-23 MED ORDER — LORAZEPAM 2 MG/ML IJ SOLN
0.5000 mg | Freq: Four times a day (QID) | INTRAMUSCULAR | Status: DC | PRN
  Administered 2019-01-23: 0.5 mg via INTRAVENOUS
  Filled 2019-01-23: qty 1

## 2019-01-23 MED ORDER — MORPHINE SULFATE (PF) 4 MG/ML IV SOLN
4.0000 mg | INTRAVENOUS | Status: DC | PRN
  Administered 2019-01-23 – 2019-01-25 (×8): 4 mg via INTRAVENOUS
  Filled 2019-01-23 (×8): qty 1

## 2019-01-23 MED ORDER — HYDRALAZINE HCL 20 MG/ML IJ SOLN
10.0000 mg | INTRAMUSCULAR | Status: DC | PRN
  Administered 2019-01-23: 10 mg via INTRAVENOUS
  Filled 2019-01-23: qty 0.5

## 2019-01-23 MED ORDER — HYDRALAZINE HCL 20 MG/ML IJ SOLN
INTRAMUSCULAR | Status: AC
  Filled 2019-01-23: qty 1

## 2019-01-23 MED ORDER — HYDROMORPHONE HCL 1 MG/ML IJ SOLN
1.0000 mg | Freq: Once | INTRAMUSCULAR | Status: AC
  Administered 2019-01-23: 1 mg via INTRAVENOUS
  Filled 2019-01-23: qty 1

## 2019-01-23 MED ORDER — IOHEXOL 9 MG/ML PO SOLN
500.0000 mL | ORAL | Status: DC
  Administered 2019-01-23: 500 mL via ORAL

## 2019-01-23 MED ORDER — PROMETHAZINE HCL 25 MG/ML IJ SOLN
12.5000 mg | Freq: Four times a day (QID) | INTRAMUSCULAR | Status: DC | PRN
  Administered 2019-01-23 – 2019-01-25 (×3): 12.5 mg via INTRAVENOUS
  Filled 2019-01-23 (×4): qty 1

## 2019-01-23 MED ORDER — INSULIN ASPART 100 UNIT/ML ~~LOC~~ SOLN
0.0000 [IU] | Freq: Three times a day (TID) | SUBCUTANEOUS | Status: DC
  Administered 2019-01-25: 1 [IU] via SUBCUTANEOUS
  Filled 2019-01-23: qty 1

## 2019-01-23 MED ORDER — ENOXAPARIN SODIUM 40 MG/0.4ML ~~LOC~~ SOLN
40.0000 mg | SUBCUTANEOUS | Status: DC
  Administered 2019-01-23 – 2019-01-27 (×5): 40 mg via SUBCUTANEOUS
  Filled 2019-01-23 (×5): qty 0.4

## 2019-01-23 NOTE — ED Notes (Addendum)
Unable to give report because pt has not been swabbed for covid and is hypertensive w/o orders to administer medication to treat the HTN. Sharion Settler contacted on secure chat to obtain orders.

## 2019-01-23 NOTE — Progress Notes (Signed)
PROGRESS NOTE    Leonela Kivi  ZDG:[id number] DOB: [birth year] DOA: 01/22/2019 PCP: Thomes Dinning, MD    Brief Narrative:  Terri Berry is a 43 y.o. 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  Pain has been off the last 2 to 3 days.  Associated with intractable nausea with vomiting.  Denies any fever or chills.  Denied any sick contacts.  Denied any bright red blood per rectum.  Denied any melena or hematemesis.  Patient was seen and evaluated in the ER.  Pain is said to be consistent with previous pancreatitis.  She was evaluated and found to have elevated lipase.  Patient has been diagnosed with acute on chronic pancreatitis. .  12/19: Patient continues to endorse severe abdominal pain.  Vital signs stable.   Assessment & Plan:   Principal Problem:   Acute on chronic pancreatitis (HCC) Active Problems:   GERD (gastroesophageal reflux disease)   HTN (hypertension)   Diabetes (HCC)   Intractable nausea and vomiting  # acute on chronic pancreatitis  Secondary to recurrent hypertriglyceridemia.  Unclear trigger during this event Patient is in fairly significant pain, out of proportion to exam She does have history of chronic pain syndrome and may be contributing She states that morphine provides the best level of pain relief and Dilaudid only makes her dizzy Plan: Aggressive IV fluids Bowel rest Morphine for pain control Phenergan for nausea control Hold statin Check CT abdomen pelvis with contrast rule out hemorrhagic pancreatitis Follow serial lipase Follow serial triglycerides   #2 hypertensive urgency:  Initiate hydralazine and labetalol as needed IV.  #3 hypertriglyceridemia:  Chronic.   Once  pancreatitis resolves resume lowering of triglycerides May consider endocrinologist as outpatient  #4 diabetes:  Initiate sliding scale insulin and monitor  #5 GERD:  Continue with PPIs  #6 leukocytosis: Most likely due to pancreatitis.   DVT prophylaxis: Lovenox Code Status: Full Family Communication: None  disposition Plan: Home pending clinical improvement  Consultants: None  Procedures: None  Antimicrobials: None  Subjective: Seen and examined Endorsing significant abdominal pain with radiation to the back No acute overnight status changes. All vital signs stable  Objective: Vitals:   01/23/19 0351 01/23/19 0552 01/23/19 0620 01/23/19 0824  BP: (!) 189/81 (!) 169/78 (!) 162/99 (!) 163/76  Pulse: 67 73 65 (!) 59  Resp: 16 19 16    Temp: 98.3 F (36.8 C)  98.3 F (36.8 C) 98 F (36.7 C)  TempSrc: Oral  Oral Oral  SpO2: 96% 96% 98% 97%  Weight:      Height:       No intake or output data in the 24 hours ending 01/23/19 1313 Filed Weights   01/22/19 1740  Weight: 90.7 kg    Examination:  General exam: Appears calm and comfortable  Respiratory system: Clear to auscultation. Respiratory effort normal. Cardiovascular system: S1 & S2 heard, RRR. No JVD, murmurs, rubs, gallops or clicks. No pedal edema. Gastrointestinal system: Abdomen is nondistended, soft.  Tender to palpation in epigastrium.  Central nervous system: Alert and oriented. No focal neurological deficits. Extremities: Symmetric 5 x 5 power. Skin: No rashes, lesions or ulcers Psychiatry: Judgement and insight appear normal. Mood & affect appropriate.     Data Reviewed: I have personally reviewed following labs and imaging  studies  CBC: Recent Labs  Lab 01/22/19 1741 01/23/19 0544  WBC 11.9* 12.9*  HGB 11.0* 11.2*  HCT 36.1 36.8  MCV 75.1* 74.9*  PLT 317 336   Basic Metabolic Panel: Recent Labs  Lab 01/22/19 1741 01/23/19 0544  NA 137 136  K 4.0 4.1  CL 101 100  CO2 24 24    GLUCOSE 175* 154*  BUN 14 14  CREATININE 1.05* 0.95  CALCIUM 9.2 9.4   GFR: Estimated Creatinine Clearance: 86.4 mL/min (by C-G formula based on SCr of 0.95 mg/dL). Liver Function Tests: Recent Labs  Lab 01/22/19 1741 01/23/19 0544  AST 25 18  ALT 19 18  ALKPHOS 48 54  BILITOT 0.6 0.7  PROT 7.9 7.6  ALBUMIN 4.4 4.1   Recent Labs  Lab 01/22/19 1741 01/23/19 0544  LIPASE 195* 288*   No results for input(s): AMMONIA in the last 168 hours. Coagulation Profile: No results for input(s): INR, PROTIME in the last 168 hours. Cardiac Enzymes: No results for input(s): CKTOTAL, CKMB, CKMBINDEX, TROPONINI in the last 168 hours. BNP (last 3 results) No results for input(s): PROBNP in the last 8760 hours. HbA1C: Recent Labs    01/23/19 0543  HGBA1C 7.8*   CBG: Recent Labs  Lab 01/23/19 0831 01/23/19 1142  GLUCAP 133* 139*   Lipid Profile: Recent Labs    01/22/19 1741  CHOL 202*  HDL 33*  LDLCALC 115*  TRIG 272*  CHOLHDL 6.1   Thyroid Function Tests: No results for input(s): TSH, T4TOTAL, FREET4, T3FREE, THYROIDAB in the last 72 hours. Anemia Panel: No results for input(s): VITAMINB12, FOLATE, FERRITIN, TIBC, IRON, RETICCTPCT in the last 72 hours. Sepsis Labs: No results for input(s): PROCALCITON, LATICACIDVEN in the last 168 hours.  Recent Results (from the past 240 hour(s))  SARS CORONAVIRUS 2 (TAT 6-24 HRS) Nasopharyngeal Nasopharyngeal Swab     Status: None   Collection Time: 01/23/19  5:45 AM   Specimen: Nasopharyngeal Swab  Result Value Ref Range Status   SARS Coronavirus 2 NEGATIVE NEGATIVE Final    Comment: (NOTE) SARS-CoV-2 target nucleic acids are NOT DETECTED. The SARS-CoV-2 RNA is generally detectable in upper and lower respiratory specimens during the acute phase of infection. Negative results do not preclude SARS-CoV-2 infection, do not rule out co-infections with other pathogens, and should not be used as the sole basis for treatment or other  patient management decisions. Negative results must be combined with clinical observations, patient history, and epidemiological information. The expected result is Negative. Fact Sheet for Patients: HairSlick.nohttps://www.fda.gov/media/138098/download Fact Sheet for Healthcare Providers: quierodirigir.comhttps://www.fda.gov/media/138095/download This test is not yet approved or cleared by the [place] FDA and  has been authorized for detection and/or diagnosis of SARS-CoV-2 by FDA under an Emergency Use Authorization (EUA). This EUA will remain  in effect (meaning this test can be used) for the duration of the COVID-19 declaration under Section 56 4(b)(1) of the Act, 21 U.S.C. section 360bbb-3(b)(1), unless the authorization is terminated or revoked sooner. Performed at [facility], [address] [place], [place] [address]          Radiology Studies: No results found.      Scheduled Meds: . enoxaparin (LOVENOX) injection  40 mg Subcutaneous Q24H  . hydrALAZINE      . insulin aspart  0-5 Units Subcutaneous QHS  . insulin aspart  0-6 Units Subcutaneous TID WC   Continuous Infusions: . lactated ringers 150 mL/hr at 01/23/19 0744     LOS: 1 day  Time spent: 35 minutes    Sidney Ace, MD [facility] Pager [phone]  If 7PM-7AM, please contact night-coverage [email or web address] Password [email or web address] 01/23/2019, 1:13 PM

## 2019-01-23 NOTE — ED Notes (Signed)
Upon RN entrance to room pt is lying on right side, seems to be resting comfortable to distress noted

## 2019-01-23 NOTE — Progress Notes (Signed)
Pt began to gag prior to trying oral contrast and stated she could not tolerate it. Pt had coughing/gag with small amount clear secretions and beige sputum produced. Phenergan IV given with pt reporting she cannot tolerated oral contrast. MD notified. Pt proceeded to CT scan with Crystal, CT tech, advised pt unable to tolerate oral contrast. Ativan 0.5 mg IV given as advised by MD prior to scan. RTC from CT reporting pt refusing IV contrast due her IV site too painful-reported they have flushed it readily with site normal. Pt refused IV contrast for CT scan. Pt currently sleeping with IVF"s infusing readily.

## 2019-01-23 NOTE — Progress Notes (Addendum)
Morphine effective for pain with pt sleeping at long intervals;arouses easily. Pt spoke with husband on phone asking him to come/very anxious with RN reassuring pt/ husband. BP elevated, asymptomatic and trending down.  Pt reassured with pain medication given. Slept at long intervals.  Pt went to sleep during last BP recheck. No emesis or dry heaves. Abdomen remains distended.

## 2019-01-23 NOTE — ED Notes (Signed)
Idolina Primer [phone] is spouse of pt and would like any pertinent updates.

## 2019-01-23 NOTE — Progress Notes (Signed)
TC with husband with update given with questions answered. Pt sleeping soundly in between care. IVF's infusing readily. No emesis noted.

## 2019-01-24 ENCOUNTER — Inpatient Hospital Stay: Payer: Medicaid Other

## 2019-01-24 LAB — GLUCOSE, CAPILLARY
Glucose-Capillary: 113 mg/dL — ABNORMAL HIGH (ref 70–99)
Glucose-Capillary: 116 mg/dL — ABNORMAL HIGH (ref 70–99)
Glucose-Capillary: 131 mg/dL — ABNORMAL HIGH (ref 70–99)
Glucose-Capillary: 134 mg/dL — ABNORMAL HIGH (ref 70–99)

## 2019-01-24 LAB — LIPASE, BLOOD: Lipase: 187 U/L — ABNORMAL HIGH (ref 11–51)

## 2019-01-24 MED ORDER — POLYETHYLENE GLYCOL 3350 17 G PO PACK
17.0000 g | PACK | Freq: Every day | ORAL | Status: DC
  Administered 2019-01-25 – 2019-01-27 (×3): 17 g via ORAL
  Filled 2019-01-24 (×3): qty 1

## 2019-01-24 MED ORDER — SODIUM CHLORIDE 0.9% FLUSH: 3.0000 mL | INTRAVENOUS | Status: DC | PRN

## 2019-01-24 MED ORDER — SODIUM CHLORIDE 0.9% FLUSH
3.0000 mL | Freq: Two times a day (BID) | INTRAVENOUS | Status: DC
  Administered 2019-01-24 – 2019-01-26 (×6): 3 mL via INTRAVENOUS

## 2019-01-24 MED ORDER — BISACODYL 10 MG RE SUPP: 10.0000 mg | Freq: Every day | RECTAL | Status: DC | PRN

## 2019-01-24 MED ORDER — LORAZEPAM 1 MG PO TABS: 1.0000 mg | ORAL_TABLET | ORAL | Status: DC | PRN

## 2019-01-24 MED ORDER — CLONAZEPAM 0.5 MG PO TABS
0.5000 mg | ORAL_TABLET | Freq: Three times a day (TID) | ORAL | Status: DC
  Administered 2019-01-24 (×2): 0.5 mg via ORAL
  Filled 2019-01-24 (×2): qty 1

## 2019-01-24 MED ORDER — DOCUSATE SODIUM 100 MG PO CAPS
100.0000 mg | ORAL_CAPSULE | Freq: Two times a day (BID) | ORAL | Status: DC
  Administered 2019-01-24 – 2019-01-27 (×6): 100 mg via ORAL
  Filled 2019-01-24 (×6): qty 1

## 2019-01-24 MED ORDER — LORAZEPAM 2 MG/ML IJ SOLN
1.0000 mg | Freq: Four times a day (QID) | INTRAMUSCULAR | Status: DC | PRN
  Administered 2019-01-24: 1 mg via INTRAVENOUS
  Filled 2019-01-24: qty 1

## 2019-01-24 NOTE — Progress Notes (Signed)
Lipase down to 187. Pt given ativan 1 mg IV prior to CT with emotional support with pt able to tolerate CT Scan which indicates pancreatitis with some ascites. No nausea/vomiting; sleeping at long intervals except up to BR for void. Morphine given PRN with pain controlled; klonipin started at first pt waking.

## 2019-01-24 NOTE — Progress Notes (Signed)
PROGRESS NOTE    Terri LotDiane Hollingshed  YQM:[id number] DOB: 01/05/1976 DOA: 01/22/2019 PCP: Thomes DinningWeeks, Cynthia, MD    Brief Narrative:  Terri Berry is a 43 y.o. 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  Pain has been off the last 2 to 3 days.  Associated with intractable nausea with vomiting.  Denies any fever or chills.  Denied any sick contacts.  Denied any bright red blood per rectum.  Denied any melena or hematemesis.  Patient was seen and evaluated in the ER.  Pain is said to be consistent with previous pancreatitis.  She was evaluated and found to have elevated lipase.  Patient has been diagnosed with acute on chronic pancreatitis. .  12/19: Patient continues to endorse severe abdominal pain.  Vital signs stable.  12/20: Patient continues to endorse diffuse abdominal pain.  Lipase downtrending.  Patient was unable to tolerate IV contrast administration yesterday so CT abdomen was canceled.   Assessment & Plan:   Principal Problem:   Acute on chronic pancreatitis (HCC) Active Problems:   GERD (gastroesophageal reflux disease)   HTN (hypertension)   Diabetes (HCC)   Intractable nausea and vomiting  # acute on chronic pancreatitis  Secondary to recurrent hypertriglyceridemia.  Unclear trigger during this event Patient is in fairly significant pain, out of proportion to exam She does have history of chronic pain syndrome and may be contributing She states that morphine provides the best level of pain relief and Dilaudid only makes her dizzy Lipase downtrending Unable to tolerate PO or IV contrast Plan: Continue IVF Advance to clears Morphine for pain control Phenergan for nausea control Hold statin Check CT  abd/pel without contrast to evaluate persistent severe abd pain Follow serial lipase Follow serial triglycerides   #2 hypertensive urgency:  Initiate hydralazine and labetalol as needed IV.  #3 hypertriglyceridemia:  Chronic.   Once pancreatitis resolves resume lowering of triglycerides May consider endocrinologist as outpatient  #4 diabetes:  Initiate sliding scale insulin and monitor  #5 GERD:  Continue with PPIs  #6 leukocytosis: Most likely due to pancreatitis.  #7 Anxiety Suspect underlying anxiety regarding clinical picture Patient states he takes clonazepam at home Will restart clonazepam 0.5 mg p.o. 3 times daily IV Ativan 30 minutes prior to CT scan Continue to reassure patient   DVT prophylaxis: Lovenox Code Status: Full Family Communication: None  disposition Plan: Home pending clinical improvement  Consultants: None  Procedures: None  Antimicrobials: None  Subjective: Seen and examined Continues to endorse severe pain  Also endorsing anxiety  Lipase downtrending Could not tolerate IV contrast administration  Objective: Vitals:   01/23/19 1723 01/23/19 1739 01/23/19 2334 01/24/19 0753  BP: (!) 181/100 (!) 166/93 140/81 (!) 156/89  Pulse:  66 65 62  Resp:  16 16   Temp:   98.1 F (36.7 C) 98.6 F (37 C)  TempSrc:    Oral  SpO2:  96% 95% 95%  Weight:      Height:        Intake/Output Summary (Last 24 hours) at 01/24/2019 1232 Last data filed at 01/24/2019 [id number] Gross per 24 hour  Intake 1016.23 ml  Output 975 ml  Net 41.23 ml   Filed Weights   01/22/19 1740  Weight: 90.7 kg    Examination:  General  exam: Appears calm and comfortable  Respiratory system: Clear to auscultation. Respiratory effort normal. Cardiovascular system: S1 & S2 heard, RRR. No JVD, murmurs, rubs, gallops or clicks. No pedal edema. Gastrointestinal system: Abdomen is nondistended, soft.  Tender to palpation in epigastrium.  Central nervous system: Alert  and oriented. No focal neurological deficits. Extremities: Symmetric 5 x 5 power. Skin: No rashes, lesions or ulcers Psychiatry: Judgement and insight appear normal. Mood & affect appropriate.     Data Reviewed: I have personally reviewed following labs and imaging studies  CBC: Recent Labs  Lab 01/22/19 1741 01/23/19 0544  WBC 11.9* 12.9*  HGB 11.0* 11.2*  HCT 36.1 36.8  MCV 75.1* 74.9*  PLT 317 336   Basic Metabolic Panel: Recent Labs  Lab 01/22/19 1741 01/23/19 0544  NA 137 136  K 4.0 4.1  CL 101 100  CO2 24 24  GLUCOSE 175* 154*  BUN 14 14  CREATININE 1.05* 0.95  CALCIUM 9.2 9.4   GFR: Estimated Creatinine Clearance: 86.4 mL/min (by C-G formula based on SCr of 0.95 mg/dL). Liver Function Tests: Recent Labs  Lab 01/22/19 1741 01/23/19 0544  AST 25 18  ALT 19 18  ALKPHOS 48 54  BILITOT 0.6 0.7  PROT 7.9 7.6  ALBUMIN 4.4 4.1   Recent Labs  Lab 01/22/19 1741 01/23/19 0544 01/24/19 0329  LIPASE 195* 288* 187*   No results for input(s): AMMONIA in the last 168 hours. Coagulation Profile: No results for input(s): INR, PROTIME in the last 168 hours. Cardiac Enzymes: No results for input(s): CKTOTAL, CKMB, CKMBINDEX, TROPONINI in the last 168 hours. BNP (last 3 results) No results for input(s): PROBNP in the last 8760 hours. HbA1C: Recent Labs    01/23/19 0543  HGBA1C 7.8*   CBG: Recent Labs  Lab 01/23/19 1142 01/23/19 1638 01/23/19 2112 01/24/19 0755 01/24/19 1132  GLUCAP 139* 125* 113* 113* 116*   Lipid Profile: Recent Labs    01/22/19 1741  CHOL 202*  HDL 33*  LDLCALC 115*  TRIG 272*  CHOLHDL 6.1   Thyroid Function Tests: No results for input(s): TSH, T4TOTAL, FREET4, T3FREE, THYROIDAB in the last 72 hours. Anemia Panel: No results for input(s): VITAMINB12, FOLATE, FERRITIN, TIBC, IRON, RETICCTPCT in the last 72 hours. Sepsis Labs: No results for input(s): PROCALCITON, LATICACIDVEN in the last 168 hours.  Recent Results  (from the past 240 hour(s))  SARS CORONAVIRUS 2 (TAT 6-24 HRS) Nasopharyngeal Nasopharyngeal Swab     Status: None   Collection Time: 01/23/19  5:45 AM   Specimen: Nasopharyngeal Swab  Result Value Ref Range Status   SARS Coronavirus 2 NEGATIVE NEGATIVE Final    Comment: (NOTE) SARS-CoV-2 target nucleic acids are NOT DETECTED. The SARS-CoV-2 RNA is generally detectable in upper and lower respiratory specimens during the acute phase of infection. Negative results do not preclude SARS-CoV-2 infection, do not rule out co-infections with other pathogens, and should not be used as the sole basis for treatment or other patient management decisions. Negative results must be combined with clinical observations, patient history, and epidemiological information. The expected result is Negative. Fact Sheet for Patients: HairSlick.no Fact Sheet for Healthcare Providers: quierodirigir.com This test is not yet approved or cleared by the [place] FDA and  has been authorized for detection and/or diagnosis of SARS-CoV-2 by FDA under an Emergency Use Authorization (EUA). This EUA will remain  in effect (meaning this test can be used) for the duration of the COVID-19 declaration under Section 56 4(b)(1) of the  Act, 21 U.S.C. section 360bbb-3(b)(1), unless the authorization is terminated or revoked sooner. Performed at [facility], [place] [address] [place], [place] [address]          Radiology Studies: No results found.      Scheduled Meds: . clonazePAM  0.5 mg Oral TID  . enoxaparin (LOVENOX) injection  40 mg Subcutaneous Q24H  . insulin aspart  0-5 Units Subcutaneous QHS  . insulin aspart  0-6 Units Subcutaneous TID WC  . sodium chloride flush  3 mL Intravenous Q12H   Continuous Infusions: . lactated ringers 75 mL/hr at 01/24/19 0931     LOS: 2 days    Time spent: 35 minutes    Sidney Ace, MD [facility] Pager [phone]  If 7PM-7AM, please contact night-coverage [email or web address] Password [facility] 01/24/2019, 12:32 PM

## 2019-01-25 LAB — LIPASE, BLOOD: Lipase: 94 U/L — ABNORMAL HIGH (ref 11–51)

## 2019-01-25 LAB — GLUCOSE, CAPILLARY
Glucose-Capillary: 120 mg/dL — ABNORMAL HIGH (ref 70–99)
Glucose-Capillary: 189 mg/dL — ABNORMAL HIGH (ref 70–99)
Glucose-Capillary: 94 mg/dL (ref 70–99)
Glucose-Capillary: 279 mg/dL — ABNORMAL HIGH (ref 70–99)

## 2019-01-25 MED ORDER — PROCHLORPERAZINE MALEATE 10 MG PO TABS
10.0000 mg | ORAL_TABLET | Freq: Every day | ORAL | Status: DC
  Administered 2019-01-25 – 2019-01-27 (×3): 10 mg via ORAL
  Filled 2019-01-25 (×3): qty 1

## 2019-01-25 MED ORDER — PANTOPRAZOLE SODIUM 40 MG PO TBEC
40.0000 mg | DELAYED_RELEASE_TABLET | Freq: Every day | ORAL | Status: DC
  Administered 2019-01-25 – 2019-01-27 (×3): 40 mg via ORAL
  Filled 2019-01-25 (×3): qty 1

## 2019-01-25 MED ORDER — SUCRALFATE 1 G PO TABS
1.0000 g | ORAL_TABLET | Freq: Four times a day (QID) | ORAL | Status: DC
  Administered 2019-01-25 – 2019-01-27 (×8): 1 g via ORAL
  Filled 2019-01-25 (×8): qty 1

## 2019-01-25 MED ORDER — DICYCLOMINE HCL 10 MG PO CAPS
10.0000 mg | ORAL_CAPSULE | Freq: Three times a day (TID) | ORAL | Status: DC | PRN
  Administered 2019-01-26: 10 mg via ORAL
  Filled 2019-01-25 (×3): qty 1

## 2019-01-25 MED ORDER — MIRTAZAPINE 15 MG PO TABS
30.0000 mg | ORAL_TABLET | Freq: Every day | ORAL | Status: DC
  Administered 2019-01-25 – 2019-01-26 (×2): 30 mg via ORAL
  Filled 2019-01-25 (×2): qty 2

## 2019-01-25 MED ORDER — AMLODIPINE BESYLATE 10 MG PO TABS
10.0000 mg | ORAL_TABLET | Freq: Every day | ORAL | Status: DC
  Administered 2019-01-25 – 2019-01-27 (×3): 10 mg via ORAL
  Filled 2019-01-25 (×3): qty 1

## 2019-01-25 MED ORDER — CLONAZEPAM 1 MG PO TABS
1.0000 mg | ORAL_TABLET | Freq: Three times a day (TID) | ORAL | Status: DC
  Administered 2019-01-25 – 2019-01-27 (×7): 1 mg via ORAL
  Filled 2019-01-25 (×7): qty 1

## 2019-01-25 MED ORDER — KETOROLAC TROMETHAMINE 15 MG/ML IJ SOLN
15.0000 mg | Freq: Four times a day (QID) | INTRAMUSCULAR | Status: DC
  Administered 2019-01-25 – 2019-01-27 (×8): 15 mg via INTRAVENOUS
  Filled 2019-01-25 (×8): qty 1

## 2019-01-25 MED ORDER — MELATONIN 5 MG PO TABS
5.0000 mg | ORAL_TABLET | Freq: Every day | ORAL | Status: DC
  Administered 2019-01-25 – 2019-01-26 (×2): 5 mg via ORAL
  Filled 2019-01-25 (×3): qty 1

## 2019-01-25 MED ORDER — PREGABALIN 75 MG PO CAPS
200.0000 mg | ORAL_CAPSULE | Freq: Three times a day (TID) | ORAL | Status: DC
  Administered 2019-01-25 – 2019-01-27 (×6): 200 mg via ORAL
  Filled 2019-01-25 (×6): qty 2

## 2019-01-25 MED ORDER — LORATADINE 10 MG PO TABS
10.0000 mg | ORAL_TABLET | Freq: Every day | ORAL | Status: DC
  Administered 2019-01-25 – 2019-01-27 (×3): 10 mg via ORAL
  Filled 2019-01-25 (×3): qty 1

## 2019-01-25 MED ORDER — TIZANIDINE HCL 4 MG PO TABS
4.0000 mg | ORAL_TABLET | Freq: Two times a day (BID) | ORAL | Status: DC | PRN
  Administered 2019-01-26: 4 mg via ORAL
  Filled 2019-01-25 (×4): qty 1

## 2019-01-25 MED ORDER — ALBUTEROL SULFATE (2.5 MG/3ML) 0.083% IN NEBU: 3.0000 mL | INHALATION_SOLUTION | Freq: Four times a day (QID) | RESPIRATORY_TRACT | Status: DC | PRN

## 2019-01-25 MED ORDER — SENNA 8.6 MG PO TABS
1.0000 | ORAL_TABLET | Freq: Two times a day (BID) | ORAL | Status: DC
  Administered 2019-01-25 – 2019-01-27 (×4): 8.6 mg via ORAL
  Filled 2019-01-25 (×4): qty 1

## 2019-01-25 NOTE — Progress Notes (Signed)
PROGRESS NOTE    Terri Berry  BJY:[id number] DOB: 1975/12/22 DOA: 01/22/2019 PCP: Joanie Coddington, MD    Brief Narrative:  Terri Berry is a 43 y.o. 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  Pain has been off the last 2 to 3 days.  Associated with intractable nausea with vomiting.  Denies any fever or chills.  Denied any sick contacts.  Denied any bright red blood per rectum.  Denied any melena or hematemesis.  Patient was seen and evaluated in the ER.  Pain is said to be consistent with previous pancreatitis.  She was evaluated and found to have elevated lipase.  Patient has been diagnosed with acute on chronic pancreatitis. .  12/19: Patient continues to endorse severe abdominal pain.  Vital signs stable.  12/20: Patient continues to endorse diffuse abdominal pain.  Lipase downtrending.  Patient was unable to tolerate IV contrast administration yesterday so CT abdomen was canceled.  12/21: Patient sleeping, easily arousable.  Lipase continues to downtrend.  CT abdomen pelvis without contrast completed yesterday.  Significant for acute interstitial pancreatitis significant inflammatory changes in the retroperitoneum.   Assessment & Plan:   Principal Problem:   Acute on chronic pancreatitis (HCC) Active Problems:   GERD (gastroesophageal reflux disease)   HTN (hypertension)   Diabetes (HCC)   Intractable nausea and vomiting  # acute on chronic pancreatitis  Secondary to recurrent hypertriglyceridemia.  Unclear trigger during this event Patient is in fairly significant pain, out of proportion to exam She does have history of chronic pain syndrome and may be contributing She states that morphine provides the  best level of pain relief CT abdomen without contrast-acute interstitial pancreatitis with associated inflammatory changes in the retroperitoneum Lipase downtrending Plan: Continue IVF, LR at 75cc/hr Continue clears for now, advance as tolerated Morphine for pain control Add scheduled toradol 15mg  IV q6h for 48 hours Phenergan for nausea control Hold statin Follow serial lipase Follow serial triglycerides   #2 hypertensive urgency:  Initiate hydralazine and labetalol as needed IV. Add scheduled amlodipine 10mg  PO QD  #3 hypertriglyceridemia:  Chronic.   Once pancreatitis resolves resume lowering of triglycerides May consider endocrinologist as outpatient  #4 diabetes:  Initiate sliding scale insulin and monitor  #5 GERD:  Continue with PPIs  #6 leukocytosis: Most likely due to pancreatitis.  #7 Anxiety Suspect underlying anxiety regarding clinical picture Patient states he takes clonazepam at home Scheduled Klonopin 1mg  TID Continue to reassure patient   DVT prophylaxis: Lovenox Code Status: Full Family Communication: None  disposition Plan: Home pending clinical improvement  Consultants: None  Procedures: None  Antimicrobials: None  Subjective: Seen and examined Continues to endorse severe pain  Also endorsing anxiety  Lipase downtrending   Objective: Vitals:   01/24/19 0753 01/24/19 1453 01/25/19 0040 01/25/19 0831  BP: (!) 156/89 (!) 140/94 (!) 152/100 (!) 156/88  Pulse: 62 72 69 64  Resp:   19 16  Temp: 98.6 F (37 C) 98.2 F (36.8 C) 98.1 F (36.7 C) 97.8 F (36.6 C)  TempSrc: Oral  Oral Oral  SpO2: 95% 95% 97% 95%  Weight:      Height:        Intake/Output Summary (Last 24 hours) at 01/25/2019 1129 Last data filed at 01/24/2019 2016 Gross per 24  hour  Intake 694 ml  Output 1300 ml  Net -606 ml   Filed Weights   01/22/19 1740  Weight: 90.7 kg    Examination:  General exam: Appears calm and comfortable  Respiratory  system: Clear to auscultation. Respiratory effort normal. Cardiovascular system: S1 & S2 heard, RRR. No JVD, murmurs, rubs, gallops or clicks. No pedal edema. Gastrointestinal system: Abdomen is nondistended, soft.  Tender to palpation in epigastrium.  Central nervous system: Alert and oriented. No focal neurological deficits. Extremities: Symmetric 5 x 5 power. Skin: No rashes, lesions or ulcers Psychiatry: Judgement and insight appear normal. Mood & affect appropriate.     Data Reviewed: I have personally reviewed following labs and imaging studies  CBC: Recent Labs  Lab 01/22/19 1741 01/23/19 0544  WBC 11.9* 12.9*  HGB 11.0* 11.2*  HCT 36.1 36.8  MCV 75.1* 74.9*  PLT 317 336   Basic Metabolic Panel: Recent Labs  Lab 01/22/19 1741 01/23/19 0544  NA 137 136  K 4.0 4.1  CL 101 100  CO2 24 24  GLUCOSE 175* 154*  BUN 14 14  CREATININE 1.05* 0.95  CALCIUM 9.2 9.4   GFR: Estimated Creatinine Clearance: 86.4 mL/min (by C-G formula based on SCr of 0.95 mg/dL). Liver Function Tests: Recent Labs  Lab 01/22/19 1741 01/23/19 0544  AST 25 18  ALT 19 18  ALKPHOS 48 54  BILITOT 0.6 0.7  PROT 7.9 7.6  ALBUMIN 4.4 4.1   Recent Labs  Lab 01/22/19 1741 01/23/19 0544 01/24/19 0329 01/25/19 0416  LIPASE 195* 288* 187* 94*   No results for input(s): AMMONIA in the last 168 hours. Coagulation Profile: No results for input(s): INR, PROTIME in the last 168 hours. Cardiac Enzymes: No results for input(s): CKTOTAL, CKMB, CKMBINDEX, TROPONINI in the last 168 hours. BNP (last 3 results) No results for input(s): PROBNP in the last 8760 hours. HbA1C: Recent Labs    01/23/19 0543  HGBA1C 7.8*   CBG: Recent Labs  Lab 01/24/19 0755 01/24/19 1132 01/24/19 1642 01/24/19 2122 01/25/19 0831  GLUCAP 113* 116* 131* 134* 120*   Lipid Profile: Recent Labs    01/22/19 1741  CHOL 202*  HDL 33*  LDLCALC 115*  TRIG 272*  CHOLHDL 6.1   Thyroid Function Tests: No results  for input(s): TSH, T4TOTAL, FREET4, T3FREE, THYROIDAB in the last 72 hours. Anemia Panel: No results for input(s): VITAMINB12, FOLATE, FERRITIN, TIBC, IRON, RETICCTPCT in the last 72 hours. Sepsis Labs: No results for input(s): PROCALCITON, LATICACIDVEN in the last 168 hours.  Recent Results (from the past 240 hour(s))  SARS CORONAVIRUS 2 (TAT 6-24 HRS) Nasopharyngeal Nasopharyngeal Swab     Status: None   Collection Time: 01/23/19  5:45 AM   Specimen: Nasopharyngeal Swab  Result Value Ref Range Status   SARS Coronavirus 2 NEGATIVE NEGATIVE Final    Comment: (NOTE) SARS-CoV-2 target nucleic acids are NOT DETECTED. The SARS-CoV-2 RNA is generally detectable in upper and lower respiratory specimens during the acute phase of infection. Negative results do not preclude SARS-CoV-2 infection, do not rule out co-infections with other pathogens, and should not be used as the sole basis for treatment or other patient management decisions. Negative results must be combined with clinical observations, patient history, and epidemiological information. The expected result is Negative. Fact Sheet for Patients: HairSlick.no Fact Sheet for Healthcare Providers: quierodirigir.com This test is not yet approved or cleared by the [place] FDA and  has been authorized for detection and/or diagnosis of SARS-CoV-2  by FDA under an Emergency Use Authorization (EUA). This EUA will remain  in effect (meaning this test can be used) for the duration of the COVID-19 declaration under Section 56 4(b)(1) of the Act, 21 U.S.C. section 360bbb-3(b)(1), unless the authorization is terminated or revoked sooner. Performed at [facility], [address] [place], [place] [address]          Radiology Studies: CT ABDOMEN PELVIS WO CONTRAST  Result Date: 01/24/2019 CLINICAL DATA:  Abdominal distension, nausea and vomiting with increased pelvic pain  EXAM: CT ABDOMEN AND PELVIS WITHOUT CONTRAST TECHNIQUE: Multidetector CT imaging of the abdomen and pelvis was performed following the standard protocol without IV contrast. COMPARISON:  07/21/2018 FINDINGS: Lower chest: Basilar atelectasis bilaterally. No signs of pleural effusion. Heart is incompletely imaged, no visible pericardial fluid. Hepatobiliary: Liver contour is lobular with suggestion of mild hepatic steatosis. Some areas of fatty sparing are noted anterior to portal structures in the central liver. Signs of cholecystectomy with mild intra and extrahepatic biliary ductal distension, duct slightly more dilated than on the most recent comparison study, but similar to the study of 03/23/2018. Pancreas: Signs of acute interstitial pancreatitis. The gland is not as expanded as it was in February of 2020. There is no current focal fluid collection about the pancreas. Glandular viability not assessed without intravenous contrast. Anterior pararenal stranding extends bilaterally to the flanks and is associated with a trace amount of ascites in the pelvis Spleen: Spleen is normal. Adrenals/Urinary Tract: Adrenal glands are normal. Renal contours are smooth without signs of hydronephrosis. Stomach/Bowel: Stomach with some perigastric stranding in the setting of acute interstitial pancreatitis. Similarly some stranding around the duodenum. Also some stranding around the jejunum proximally. The appendix is normal and seen in the right lower quadrant. Stool fills much of the colon. Mild infiltration of the phrenicocolic ligament on the left with inflammation tracking from the pancreas. Vascular/Lymphatic: Vascular structures not well assessed given lack of intravenous contrast. No signs of adenopathy but with scattered numerous lymph nodes in the upper abdomen, presumably reactive in the setting of pancreatitis. No signs of pelvic lymphadenopathy. Reproductive: Post hysterectomy. No signs of adnexal mass. Other:  Small volume ascites and retroperitoneal and upper abdominal stranding. Musculoskeletal: Signs of spinal degenerative change. Degenerative changes in the hips. No acute bone process or destructive bone finding. IMPRESSION: 1. Signs of acute interstitial pancreatitis with heterogeneity of the pancreas, extensive inflammatory changes throughout the retroperitoneum and root of the mesentery, also associated with small volume ascites. 2. Liver contour is lobular with signs of hepatic steatosis. Correlate with any clinical or laboratory evidence of liver disease. Aortic Atherosclerosis (ICD10-I70.0). Electronically Signed   By: Donzetta KohutGeoffrey  Wile M.D.   On: 01/24/2019 12:43        Scheduled Meds: . amLODipine  10 mg Oral Daily  . clonazePAM  1 mg Oral TID  . docusate sodium  100 mg Oral BID  . enoxaparin (LOVENOX) injection  40 mg Subcutaneous Q24H  . insulin aspart  0-5 Units Subcutaneous QHS  . insulin aspart  0-6 Units Subcutaneous TID WC  . polyethylene glycol  17 g Oral Daily  . sodium chloride flush  3 mL Intravenous Q12H   Continuous Infusions: . lactated ringers 75 mL/hr at 01/24/19 0931     LOS: 3 days    Time spent: 35 minutes    Tresa MooreSudheer B Mansa Willers, MD [facility] Pager [phone]  If 7PM-7AM, please contact night-coverage [email or web address] Password [email or web address] 01/25/2019, 11:29 AM

## 2019-01-25 NOTE — TOC Progression Note (Signed)
Transition of Care [facility]) - Progression Note    Patient Details  Name: Terri Berry MRN: [id number] Date of Birth: Jul 13, 1975  Transition [facility] CM/SW [place], RN Phone Number: 01/25/2019, 3:09 PM  Clinical Narrative:     Met with the patient at her request. She lives in a hotel with her father She has lived in this hotel fir many months The rent is due on the hotel room.  She attempted to call and pay for it over th phone with a debit card. The hotel stated that they do not take payments over the phone. She stated that she found one of her friends to come to the hospital to get the Debit card to go pay for the room for her in person. She stated that she is independent and has no needs at home (the hotel room) She stated that she doe snot need DME and does not need [place] services, will continue to monitor for any needs      Expected Discharge Plan and Services                                                 Social Determinants of Health (SDOH) Interventions    Readmission Risk Interventions Readmission Risk Prevention Plan 05/28/2018  Transportation Screening Complete  PCP or Specialist Appt within 3-5 Days Complete  HRI or [place] Not Complete  HRI or Home Care Consult comments NA  Social Work Consult for [place] Planning/Counseling Not Complete  SW consult not completed comments NA  Palliative Care Screening Not Applicable  Medication Review Press photographer) Complete  Some recent data might be hidden

## 2019-01-25 NOTE — Progress Notes (Signed)
Husband called asking for update. Verified with pt consent to speak with husband. Pt gave consent to speak. Returned called no response. Message left stating name position and that I was returning call.

## 2019-01-26 LAB — GLUCOSE, CAPILLARY
Glucose-Capillary: 105 mg/dL — ABNORMAL HIGH (ref 70–99)
Glucose-Capillary: 160 mg/dL — ABNORMAL HIGH (ref 70–99)
Glucose-Capillary: 150 mg/dL — ABNORMAL HIGH (ref 70–99)
Glucose-Capillary: 183 mg/dL — ABNORMAL HIGH (ref 70–99)

## 2019-01-26 MED ORDER — HYDROCODONE-ACETAMINOPHEN 5-325 MG PO TABS: 1.0000 | ORAL_TABLET | ORAL | Status: DC | PRN

## 2019-01-26 MED ORDER — LINACLOTIDE 145 MCG PO CAPS
145.0000 ug | ORAL_CAPSULE | Freq: Every day | ORAL | Status: DC
  Administered 2019-01-27: 145 ug via ORAL
  Filled 2019-01-26: qty 1

## 2019-01-26 MED ORDER — FENOFIBRATE 160 MG PO TABS
160.0000 mg | ORAL_TABLET | Freq: Every day | ORAL | Status: DC
  Administered 2019-01-26 – 2019-01-27 (×2): 160 mg via ORAL
  Filled 2019-01-26 (×2): qty 1

## 2019-01-26 MED ORDER — MORPHINE SULFATE (PF) 2 MG/ML IV SOLN: 2.0000 mg | INTRAVENOUS | Status: DC | PRN

## 2019-01-26 MED ORDER — DICYCLOMINE HCL 10 MG PO CAPS
10.0000 mg | ORAL_CAPSULE | Freq: Three times a day (TID) | ORAL | Status: DC
  Administered 2019-01-26 – 2019-01-27 (×3): 10 mg via ORAL
  Filled 2019-01-26 (×4): qty 1

## 2019-01-26 MED ORDER — PANCRELIPASE (LIP-PROT-AMYL) 12000-38000 UNITS PO CPEP
24000.0000 [IU] | ORAL_CAPSULE | Freq: Three times a day (TID) | ORAL | Status: DC
  Administered 2019-01-26 – 2019-01-27 (×3): 24000 [IU] via ORAL
  Filled 2019-01-26 (×4): qty 2

## 2019-01-26 MED ORDER — FLUOXETINE HCL 20 MG PO CAPS
60.0000 mg | ORAL_CAPSULE | Freq: Every day | ORAL | Status: DC
  Administered 2019-01-26 – 2019-01-27 (×2): 60 mg via ORAL
  Filled 2019-01-26 (×2): qty 3

## 2019-01-26 MED ORDER — HYDROCODONE-ACETAMINOPHEN 10-325 MG PO TABS
1.0000 | ORAL_TABLET | ORAL | Status: DC | PRN
  Administered 2019-01-26 – 2019-01-27 (×2): 1 via ORAL
  Filled 2019-01-26 (×3): qty 1

## 2019-01-26 NOTE — Progress Notes (Signed)
PROGRESS NOTE    Terri Berry  AVW:[id number] DOB: 1975/08/27 DOA: 01/22/2019 PCP: Thomes DinningWeeks, Cynthia, MD    Brief Narrative:  Terri Berry is a 43 y.o. 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  Pain has been off the last 2 to 3 days.  Associated with intractable nausea with vomiting.  Denies any fever or chills.  Denied any sick contacts.  Denied any bright red blood per rectum.  Denied any melena or hematemesis.  Patient was seen and evaluated in the ER.  Pain is said to be consistent with previous pancreatitis.  She was evaluated and found to have elevated lipase.  Patient has been diagnosed with acute on chronic pancreatitis. .  12/19: Patient continues to endorse severe abdominal pain.  Vital signs stable.  12/20: Patient continues to endorse diffuse abdominal pain.  Lipase downtrending.  Patient was unable to tolerate IV contrast administration yesterday so CT abdomen was canceled.  12/21: Patient sleeping, easily arousable.  Lipase continues to downtrend.  CT abdomen pelvis without contrast completed yesterday.  Significant for acute interstitial pancreatitis significant inflammatory changes in the retroperitoneum.  12/22: Patient sleeping easily aroused.  Continues to endorse severe pain.  No reported changes in pain level overnight   Assessment & Plan:   Principal Problem:   Acute on chronic pancreatitis (HCC) Active Problems:   GERD (gastroesophageal reflux disease)   HTN (hypertension)   Diabetes (HCC)   Intractable nausea and vomiting  # acute on chronic pancreatitis  Secondary to recurrent hypertriglyceridemia.  Unclear trigger during this event Patient is in fairly significant pain, out of proportion  to exam She does have history of chronic pain syndrome and may be contributing She states that morphine provides the best level of pain relief CT abdomen without contrast-acute interstitial pancreatitis with associated inflammatory changes in the retroperitoneum Lipase downtrending Plan: Continue IVF, LR at 75cc/hr Encourage diet advancement, advance to low fat diet Limit morphine use Add PRN norco with IV morphine for breakthrough only Continue scheduled toradol for additional 24 hours then stop Phenergan for nausea control Hold statin Follow serial lipase Follow serial triglycerides   #2 hypertensive urgency:  Initiate hydralazine and labetalol as needed IV. Add scheduled amlodipine 10mg  PO QD  #3 hypertriglyceridemia:  Chronic.   Once pancreatitis resolves resume lowering of triglycerides May consider endocrinologist as outpatient  #4 diabetes:  Initiate sliding scale insulin and monitor  #5 GERD:  Continue with PPIs  #6 leukocytosis: Most likely due to pancreatitis.  #7 Anxiety Suspect underlying anxiety regarding clinical picture Patient states he takes clonazepam at home Scheduled Klonopin 1mg  TID Continue to reassure patient   DVT prophylaxis: Lovenox Code Status: Full Family Communication: None  disposition Plan: Home pending clinical improvement  Consultants: None  Procedures: None  Antimicrobials: None  Subjective: Seen and examined Continues to endorse severe pain  Also endorsing anxiety  Lipase downtrending   Objective: Vitals:   01/25/19 1701 01/25/19 2148 01/26/19 0028 01/26/19 0747  BP: (!) 146/96 119/78 114/81 (!) 128/91  Pulse: 70 71 73 (!) 57  Resp: 18  17 18   Temp: (!) 97.5 F (36.4 C)  98.7 F (37.1 C) (!) 97.5 F (36.4 C)  TempSrc: Oral   Oral  SpO2: 100% 95% 94% 91%  Weight:  Height:        Intake/Output Summary (Last 24 hours) at 01/26/2019 1210 Last data filed at 01/26/2019 1107 Gross per 24 hour  Intake  6137.65 ml  Output --  Net 6137.65 ml   Filed Weights   01/22/19 1740  Weight: 90.7 kg    Examination:  General exam: Appears calm and comfortable  Respiratory system: Clear to auscultation. Respiratory effort normal. Cardiovascular system: S1 & S2 heard, RRR. No JVD, murmurs, rubs, gallops or clicks. No pedal edema. Gastrointestinal system: Abdomen is nondistended, soft.  Tender to palpation in epigastrium.  Central nervous system: Alert and oriented. No focal neurological deficits. Extremities: Symmetric 5 x 5 power. Skin: No rashes, lesions or ulcers Psychiatry: Judgement and insight appear normal. Mood & affect appropriate.     Data Reviewed: I have personally reviewed following labs and imaging studies  CBC: Recent Labs  Lab 01/22/19 1741 01/23/19 0544  WBC 11.9* 12.9*  HGB 11.0* 11.2*  HCT 36.1 36.8  MCV 75.1* 74.9*  PLT 317 336   Basic Metabolic Panel: Recent Labs  Lab 01/22/19 1741 01/23/19 0544  NA 137 136  K 4.0 4.1  CL 101 100  CO2 24 24  GLUCOSE 175* 154*  BUN 14 14  CREATININE 1.05* 0.95  CALCIUM 9.2 9.4   GFR: Estimated Creatinine Clearance: 86.4 mL/min (by C-G formula based on SCr of 0.95 mg/dL). Liver Function Tests: Recent Labs  Lab 01/22/19 1741 01/23/19 0544  AST 25 18  ALT 19 18  ALKPHOS 48 54  BILITOT 0.6 0.7  PROT 7.9 7.6  ALBUMIN 4.4 4.1   Recent Labs  Lab 01/22/19 1741 01/23/19 0544 01/24/19 0329 01/25/19 0416  LIPASE 195* 288* 187* 94*   No results for input(s): AMMONIA in the last 168 hours. Coagulation Profile: No results for input(s): INR, PROTIME in the last 168 hours. Cardiac Enzymes: No results for input(s): CKTOTAL, CKMB, CKMBINDEX, TROPONINI in the last 168 hours. BNP (last 3 results) No results for input(s): PROBNP in the last 8760 hours. HbA1C: No results for input(s): HGBA1C in the last 72 hours. CBG: Recent Labs  Lab 01/25/19 0831 01/25/19 1130 01/25/19 1656 01/25/19 2107 01/26/19 0745  GLUCAP  120* 189* 94 279* 105*   Lipid Profile: No results for input(s): CHOL, HDL, LDLCALC, TRIG, CHOLHDL, LDLDIRECT in the last 72 hours. Thyroid Function Tests: No results for input(s): TSH, T4TOTAL, FREET4, T3FREE, THYROIDAB in the last 72 hours. Anemia Panel: No results for input(s): VITAMINB12, FOLATE, FERRITIN, TIBC, IRON, RETICCTPCT in the last 72 hours. Sepsis Labs: No results for input(s): PROCALCITON, LATICACIDVEN in the last 168 hours.  Recent Results (from the past 240 hour(s))  SARS CORONAVIRUS 2 (TAT 6-24 HRS) Nasopharyngeal Nasopharyngeal Swab     Status: None   Collection Time: 01/23/19  5:45 AM   Specimen: Nasopharyngeal Swab  Result Value Ref Range Status   SARS Coronavirus 2 NEGATIVE NEGATIVE Final    Comment: (NOTE) SARS-CoV-2 target nucleic acids are NOT DETECTED. The SARS-CoV-2 RNA is generally detectable in upper and lower respiratory specimens during the acute phase of infection. Negative results do not preclude SARS-CoV-2 infection, do not rule out co-infections with other pathogens, and should not be used as the sole basis for treatment or other patient management decisions. Negative results must be combined with clinical observations, patient history, and epidemiological information. The expected result is Negative. Fact Sheet for Patients: HairSlick.no Fact Sheet for Healthcare Providers: quierodirigir.com This test is not yet approved or cleared by the [place]  States FDA and  has been authorized for detection and/or diagnosis of SARS-CoV-2 by FDA under an Emergency Use Authorization (EUA). This EUA will remain  in effect (meaning this test can be used) for the duration of the COVID-19 declaration under Section 56 4(b)(1) of the Act, 21 U.S.C. section 360bbb-3(b)(1), unless the authorization is terminated or revoked sooner. Performed at [facility], [address] [place], [place] [address]           Radiology Studies: No results found.      Scheduled Meds: . amLODipine  10 mg Oral Daily  . clonazePAM  1 mg Oral TID  . docusate sodium  100 mg Oral BID  . enoxaparin (LOVENOX) injection  40 mg Subcutaneous Q24H  . insulin aspart  0-5 Units Subcutaneous QHS  . insulin aspart  0-6 Units Subcutaneous TID WC  . ketorolac  15 mg Intravenous Q6H  . loratadine  10 mg Oral Daily  . Melatonin  5 mg Oral QHS  . mirtazapine  30 mg Oral QHS  . pantoprazole  40 mg Oral Daily  . polyethylene glycol  17 g Oral Daily  . pregabalin  200 mg Oral TID  . prochlorperazine  10 mg Oral Daily  . senna  1 tablet Oral BID  . sodium chloride flush  3 mL Intravenous Q12H  . sucralfate  1 g Oral QID   Continuous Infusions: . lactated ringers 75 mL/hr at 01/26/19 1107     LOS: 4 days    Time spent: 35 minutes    Sidney Ace, MD [facility] Pager [phone]  If 7PM-7AM, please contact night-coverage [email or web address] Password [email or web address] 01/26/2019, 12:10 PM

## 2019-01-26 NOTE — Progress Notes (Addendum)
Attempted to enter patient's room to hang fluids and give scheduled medication, husband blocking the door, stating that patient is getting dressed.  Cigarette smoke can be smelled when standing at the door.  Security alert called.  Upon entering the room, it is obvious that they have been smoking in the room and a beer can is sitting on the counter.  There is beer also in the top of the husband's bag and a large pill bottle can be seen, unknown what medication is in the bottle.  Patient's husband Collier Salina is stating that he did not know that he could not drink alcohol or smoke in the building.  Upon hearing security alert called, he grabbed his bag and stated he is leaving.  Security caught him in the hall and escorted him from the building.  Per Rosana Hoes patient cannot have anymore visitors.  Will also hold all pain medications since it is unknown what medication patient may have taken that the husband brought in.  Anderson Malta, charge nurse also aware.  Clarise Cruz, BSN

## 2019-01-27 DIAGNOSIS — I10 Essential (primary) hypertension: Secondary | ICD-10-CM

## 2019-01-27 DIAGNOSIS — K219 Gastro-esophageal reflux disease without esophagitis: Secondary | ICD-10-CM

## 2019-01-27 LAB — GLUCOSE, CAPILLARY
Glucose-Capillary: 140 mg/dL — ABNORMAL HIGH (ref 70–99)
Glucose-Capillary: 131 mg/dL — ABNORMAL HIGH (ref 70–99)

## 2019-01-27 MED ORDER — DICYCLOMINE HCL 10 MG PO CAPS: 10.0000 mg | ORAL_CAPSULE | Freq: Three times a day (TID) | ORAL | 0 refills | Status: AC

## 2019-01-27 MED ORDER — PANCRELIPASE (LIP-PROT-AMYL) 24000-76000 UNITS PO CPEP: 2.0000 | ORAL_CAPSULE | Freq: Three times a day (TID) | ORAL | 0 refills | Status: AC

## 2019-01-27 NOTE — Progress Notes (Signed)
Discharge papers reviewed with patient. Patient verbalized understanding. AVS given to patient. Patient to discharge by cab with voucher provided.

## 2019-01-27 NOTE — Discharge Instructions (Signed)
Acute Pancreatitis ° °Acute pancreatitis happens when the pancreas gets swollen. The pancreas is a large gland in the body that helps to control blood sugar. It also makes enzymes that help to digest food. °This condition can last a few days and cause serious problems. The lungs, heart, and kidneys may stop working. °What are the causes? °Causes include: °· Alcohol abuse. °· Drug abuse. °· Gallstones. °· A tumor in the pancreas. °Other causes include: °· Some medicines. °· Some chemicals. °· Diabetes. °· An infection. °· Damage caused by an accident. °· The poison (venom) from a scorpion bite. °· Belly (abdominal) surgery. °· The body's defense system (immune system) attacking the pancreas (autoimmune pancreatitis). °· Genes that are passed from parent to child (inherited). °In some cases, the cause is not known. °What are the signs or symptoms? °· Pain in the upper belly that may be felt in the back. The pain may be very bad. °· Swelling of the belly. °· Feeling sick to your stomach (nauseous) and throwing up (vomiting). °· Fever. °How is this treated? °You will likely have to stay in the hospital. Treatment may include: °· Pain medicine. °· Fluid through an IV tube. °· Placing a tube in the stomach to take out the stomach contents. This may help you stop throwing up. °· Not eating for 3-4 days. °· Antibiotic medicines, if you have an infection. °· Treating any other problems that may be the cause. °· Steroid medicines, if your problem is caused by your defense system attacking your body's own tissues. °· Surgery. °Follow these instructions at home: °Eating and drinking ° °· Follow instructions from your doctor about what to eat and drink. °· Eat foods that do not have a lot of fat in them. °· Eat small meals often. Do not eat big meals. °· Drink enough fluid to keep your pee (urine) pale yellow. °· Do not drink alcohol if it caused your condition. °Medicines °· Take over-the-counter and prescription medicines only  as told by your doctor. °· Ask your doctor if the medicine prescribed to you: °? Requires you to avoid driving or using heavy machinery. °? Can cause trouble pooping (constipation). You may need to take steps to prevent or treat trouble pooping: °§ Take over-the-counter or prescription medicines. °§ Eat foods that are high in fiber. These include beans, whole grains, and fresh fruits and vegetables. °§ Limit foods that are high in fat and sugar. These include fried or sweet foods. °General instructions °· Do not use any products that contain nicotine or tobacco, such as cigarettes, e-cigarettes, and chewing tobacco. If you need help quitting, ask your doctor. °· Get plenty of rest. °· Check your blood sugar at home as told by your doctor. °· Keep all follow-up visits as told by your doctor. This is important. °Contact a doctor if: °· You do not get better as quickly as expected. °· You have new symptoms. °· Your symptoms get worse. °· You have pain or weakness that lasts a long time. °· You keep feeling sick to your stomach. °· You get better and then you have pain again. °· You have a fever. °Get help right away if: °· You cannot eat or keep fluids down. °· Your pain gets very bad. °· Your skin or the white part of your eyes turns yellow. °· You have sudden swelling in your belly. °· You throw up. °· You feel dizzy or you pass out (faint). °· Your blood sugar is high (over 300   mg/dL). °Summary °· Acute pancreatitis happens when the pancreas gets swollen. °· This condition is often caused by alcohol abuse, drug abuse, or gallstones. °· You will likely have to stay in the hospital for treatment. °This information is not intended to replace advice given to you by your health care provider. Make sure you discuss any questions you have with your health care provider. °Document Released: 07/10/2007 Document Revised: 11/10/2017 Document Reviewed: 11/10/2017 °Elsevier Patient Education © 2020 [employer] ° °

## 2019-01-27 NOTE — TOC Transition Note (Signed)
Transition of Care [facility]) - CM/SW Discharge Note   Patient Details  Name: Terri Berry MRN: [id number] Date of Birth: 1975/11/27  [facility] CM/SW Contact:  Su Hilt, RN Phone Number: 01/27/2019, 10:36 AM   Clinical Narrative:    Patient states that she has no way of getting home and no money for a cab, I provided a Chief Strategy Officer and placed ion the chart for the bedside nurse to contact the Taxi for the patient to be able to DC home         Patient Goals and CMS Choice        Discharge Placement                       Discharge Plan and Services                                     Social Determinants of Health (SDOH) Interventions     Readmission Risk Interventions Readmission Risk Prevention Plan 05/28/2018  Transportation Screening Complete  PCP or Specialist Appt within 3-5 Days Complete  HRI or [place] Not Complete  HRI or Home Care Consult comments NA  Social Work Consult for [place] Planning/Counseling Not Complete  SW consult not completed comments NA  Palliative Care Screening Not Applicable  Medication Review Press photographer) Complete  Some recent data might be hidden

## 2019-01-28 NOTE — Discharge Summary (Signed)
[facility] [place] NAME: Terri Berry    MR#:  [id number]  DATE OF BIRTH:  14-Nov-1975  DATE OF ADMISSION:  01/22/2019   ADMITTING PHYSICIAN: Elwyn Reach, MD  DATE OF DISCHARGE: 01/27/2019  1:14 PM  PRIMARY CARE PHYSICIAN: Joanie Coddington, MD   ADMISSION DIAGNOSIS:  Pain of upper abdomen [R10.10] Acute on chronic pancreatitis ([place]) [K85.90, K86.1] Acute pancreatitis, unspecified complication status, unspecified pancreatitis type [K85.90] DISCHARGE DIAGNOSIS:  Principal Problem:   Acute on chronic pancreatitis ([place]) Active Problems:   GERD (gastroesophageal reflux disease)   HTN (hypertension)   Diabetes (HCC)   Intractable nausea and vomiting  SECONDARY DIAGNOSIS:   Past Medical History:  Diagnosis Date  . Allergy   . Anxiety   . Depression   . GERD (gastroesophageal reflux disease)   . Hypertension   . IBS (irritable bowel syndrome)   . Pancreatitis    HOSPITAL COURSE:  Terri Berry is a 43 y.o. female with medical history significant of recurrent pancreatitis secondary to hypertriglyceridemia, chronic pain syndrome, GERD, hypertension, diabetes, recurrent nausea vomiting and iron deficiency anemia admitted for 10 out of 10 abdominal pain in the epigastric region to the right upper quadrant.  Associated with intractable nausea with vomiting.   # Acute on chronic pancreatitis -Secondary to recurrent hypertriglyceridemia.  Unclear trigger during this event Pain is much better controlled, tolerating diet  #2 hypertensive urgency: resolved  #3 hypertriglyceridemia: Chronic. resume lowering of triglycerides Consider endocrinologist as outpatient  #4 diabetes:  #5 GERD: PPIs  #6 leukocytosis:due to pancreatitis.  #7 Anxiety Continue home meds DISCHARGE CONDITIONS:  stable CONSULTS OBTAINED:   DRUG ALLERGIES:   Allergies  Allergen Reactions  . Precedex [Dexmedetomidine Hcl In Nacl] Other (See Comments)   Significant Bradycardia  . Aspirin     Not indicated due to other anticoagulant use.  Marland Kitchen Cefpodoxime     Tolerates augmentin and Keflex   . Clarithromycin   . Doxycycline   . Ferrous Gluconate   . Lactose Intolerance (Gi)   . Levofloxacin   . Rizatriptan   . Sulfa Antibiotics   . Topiramate   . Venlafaxine   . Zofran [Ondansetron Hcl]    DISCHARGE MEDICATIONS:   Allergies as of 01/27/2019      Reactions   Precedex [dexmedetomidine Hcl In Nacl] Other (See Comments)   Significant Bradycardia   Aspirin    Not indicated due to other anticoagulant use.   Cefpodoxime    Tolerates augmentin and Keflex    Clarithromycin    Doxycycline    Ferrous Gluconate    Lactose Intolerance (gi)    Levofloxacin    Rizatriptan    Sulfa Antibiotics    Topiramate    Venlafaxine    Zofran [ondansetron Hcl]       Medication List    TAKE these medications   albuterol 108 (90 Base) MCG/ACT inhaler Commonly known as: VENTOLIN HFA Inhale 2 puffs into the lungs every 6 (six) hours as needed for wheezing or shortness of breath.   cetirizine 10 MG tablet Commonly known as: ZYRTEC Take 10 mg by mouth daily.   dicyclomine 10 MG capsule Commonly known as: BENTYL Take 1 capsule (10 mg total) by mouth 3 (three) times daily with meals.   fenofibrate 160 MG tablet Take 160 mg by mouth daily.   FLUoxetine 20 MG tablet Commonly known as: PROZAC Take 60 mg by mouth daily.   hydrocortisone 25 MG suppository Commonly known as: ANUSOL-HC  Place 25 mg rectally 2 (two) times a day.   linaclotide 145 MCG Caps capsule Commonly known as: LINZESS Take 1 capsule (145 mcg total) by mouth daily before breakfast.   Melatonin 3 MG Tabs Take 6 mg by mouth at bedtime.   mirtazapine 30 MG tablet Commonly known as: REMERON Take 30 mg by mouth at bedtime.   omeprazole 20 MG capsule Commonly known as: PRILOSEC Take 40 mg by mouth 2 (two) times daily.   Pancrelipase (Lip-Prot-Amyl) 24000-76000 units  Cpep Take 2 capsules (48,000 Units total) by mouth 3 (three) times daily with meals.   pregabalin 200 MG capsule Commonly known as: LYRICA Take 200 mg by mouth 3 (three) times daily.   prochlorperazine 10 MG tablet Commonly known as: COMPAZINE Take 10 mg by mouth daily.   promethazine 25 MG tablet Commonly known as: PHENERGAN Take 25 mg by mouth at bedtime as needed for nausea.   sucralfate 1 g tablet Commonly known as: Carafate Take 1 tablet (1 g total) by mouth 4 (four) times daily.   tiZANidine 4 MG tablet Commonly known as: ZANAFLEX Take 4 mg by mouth 2 (two) times daily as needed for muscle spasms.      DISCHARGE INSTRUCTIONS:   DIET:  Low fat, Low cholesterol diet DISCHARGE CONDITION:  Stable ACTIVITY:  Activity as tolerated OXYGEN:  Home Oxygen: No.  Oxygen Delivery: room air DISCHARGE LOCATION:  home   If you experience worsening of your admission symptoms, develop shortness of breath, life threatening emergency, suicidal or homicidal thoughts you must seek medical attention immediately by calling 911 or calling your MD immediately  if symptoms less severe.  You Must read complete instructions/literature along with all the possible adverse reactions/side effects for all the Medicines you take and that have been prescribed to you. Take any new Medicines after you have completely understood and accpet all the possible adverse reactions/side effects.   Please note  You were cared for by a hospitalist during your hospital stay. If you have any questions about your discharge medications or the care you received while you were in the hospital after you are discharged, you can call the unit and asked to speak with the hospitalist on call if the hospitalist that took care of you is not available. Once you are discharged, your primary care physician will handle any further medical issues. Please note that NO REFILLS for any discharge medications will be authorized once you  are discharged, as it is imperative that you return to your primary care physician (or establish a relationship with a primary care physician if you do not have one) for your aftercare needs so that they can reassess your need for medications and monitor your lab values.    On the day of Discharge:  VITAL SIGNS:  Blood pressure (!) 154/90, pulse (!) 55, temperature 97.6 F (36.4 C), temperature source Oral, resp. rate 20, height 5\' 7"  (1.702 m), weight 90.7 kg, SpO2 99 %. PHYSICAL EXAMINATION:  GENERAL:  43 y.o.-year-old patient lying in the bed with no acute distress.  EYES: Pupils equal, round, reactive to light and accommodation. No scleral icterus. Extraocular muscles intact.  HEENT: Head atraumatic, normocephalic. Oropharynx and nasopharynx clear.  NECK:  Supple, no jugular venous distention. No thyroid enlargement, no tenderness.  LUNGS: Normal breath sounds bilaterally, no wheezing, rales,rhonchi or crepitation. No use of accessory muscles of respiration.  CARDIOVASCULAR: S1, S2 normal. No murmurs, rubs, or gallops.  ABDOMEN: Soft, non-tender, non-distended. Bowel sounds present. No  organomegaly or mass.  EXTREMITIES: No pedal edema, cyanosis, or clubbing.  NEUROLOGIC: Cranial nerves II through XII are intact. Muscle strength 5/5 in all extremities. Sensation intact. Gait not checked.  PSYCHIATRIC: The patient is alert and oriented x 3.  SKIN: No obvious rash, lesion, or ulcer.  DATA REVIEW:   CBC Recent Labs  Lab 01/23/19 0544  WBC 12.9*  HGB 11.2*  HCT 36.8  PLT 336    Chemistries  Recent Labs  Lab 01/23/19 0544  NA 136  K 4.1  CL 100  CO2 24  GLUCOSE 154*  BUN 14  CREATININE 0.95  CALCIUM 9.4  AST 18  ALT 18  ALKPHOS 54  BILITOT 0.7     Follow-up Information    Thomes Dinning, MD. Schedule an appointment as soon as possible for a visit on 02/03/2019.   Specialty: Family Medicine Why: AT 4PM Contact information: [address] [place]  [address] [phone]        Gay Filler, NP. Schedule an appointment as soon as possible for a visit in 1 week(s).   Specialty: Pain Medicine Contact information: [address] [place] [address] [phone]           Management plans discussed with the patient, family and they are in agreement.  CODE STATUS: Prior   TOTAL TIME TAKING CARE OF THIS PATIENT: 45 minutes.    Delfino Lovett M.D on 01/28/2019 at 5:11 PM  Between 7am to 6pm - Pager - [phone]  After 6pm go to [email or web address] - password [email or web address]  [facility]   CC: Primary care physician; Thomes Dinning, MD   Note: This dictation was prepared with Dragon dictation along with smaller phrase technology. Any transcriptional errors that result from this process are unintentional.

## 2019-04-03 IMAGING — DX DG CHEST 1V
1 series · 1 of 1 positions shown · non-contrast
Comparison: 03/19/2018

CLINICAL DATA: 42 y/o female pt w abd pain and leukocytosis. Hx of
GERD. Smoker.

EXAM:
CHEST  1 VIEW

[chest ap]
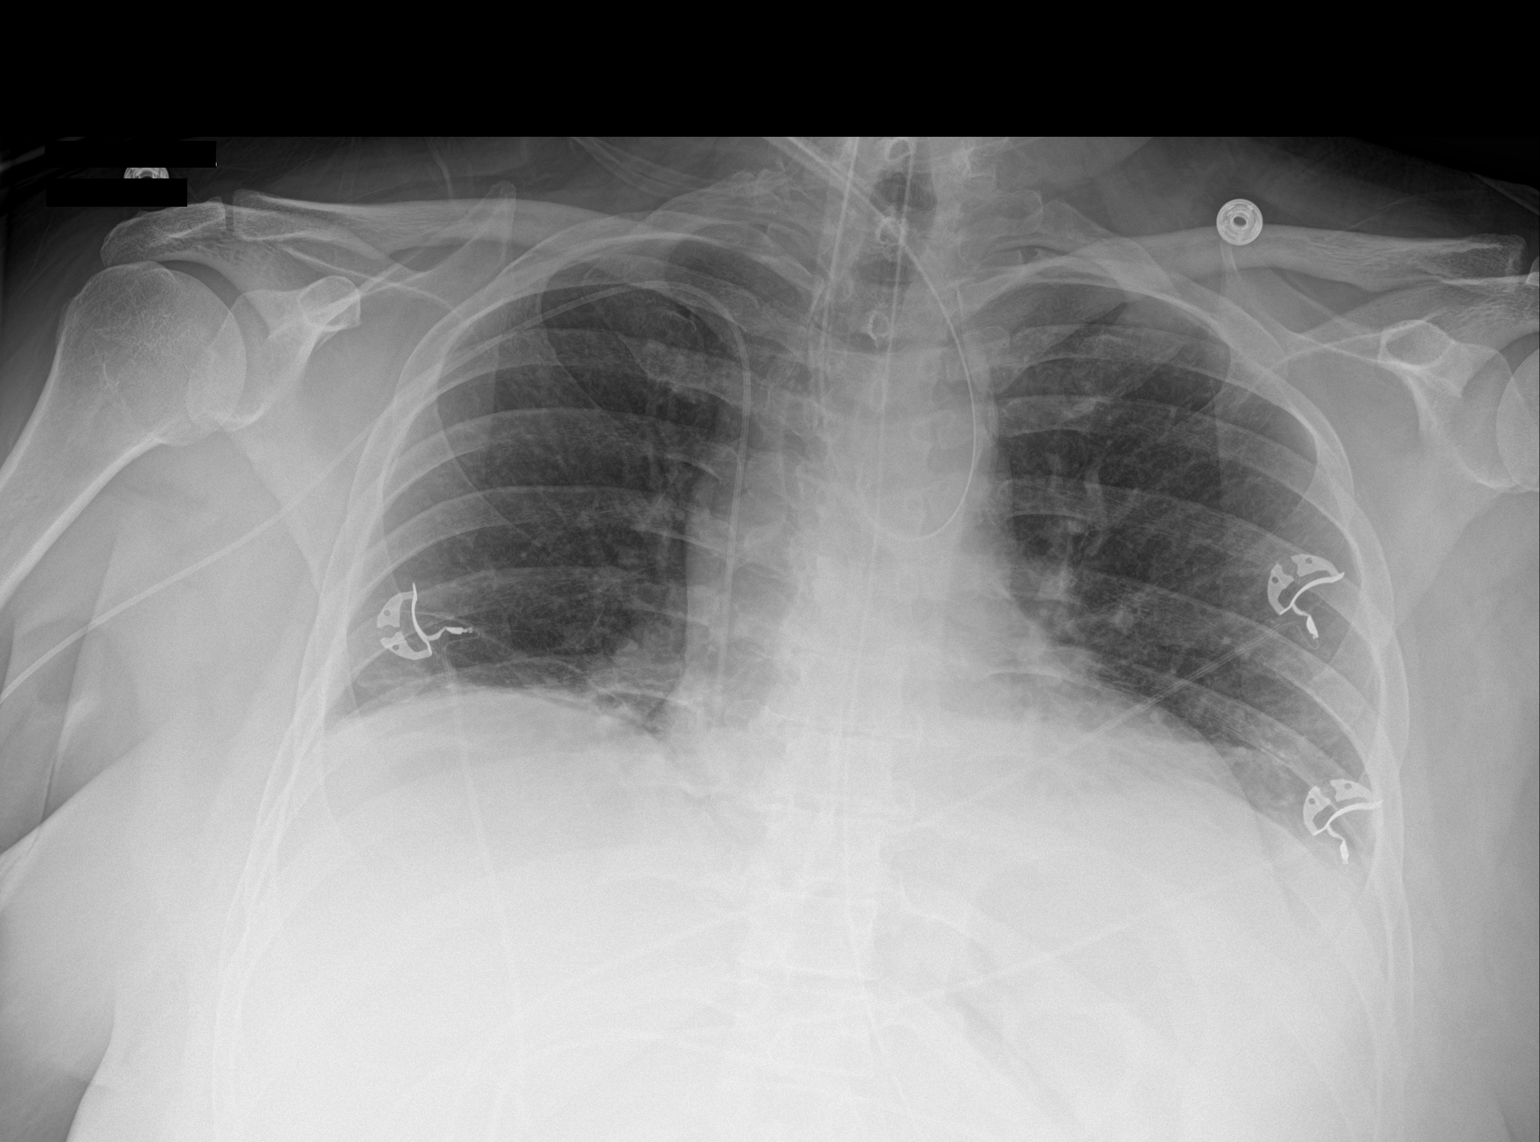

[1 of 1 positions shown; findings below may reference images not displayed]

FINDINGS: Nasogastric tube has been placed, tip beyond the gastroesophageal
junction off the image. A RIGHT-sided PICC line tip overlies the
level of the LOWER superior vena cava. Persistent bibasilar
opacities are probably not changed. No pulmonary edema.
IMPRESSION: 1. Interval placement of nasogastric tube.
2. Persistent bibasilar opacities.

## 2019-04-04 IMAGING — CR DG ABDOMEN 1V
3 series · 3 of 3 positions shown · non-contrast
Comparison: Body CT 03/23/2018

CLINICAL DATA: Abdominal pain.

EXAM:
ABDOMEN - 1 VIEW

[abdomen kub (1 of 3)]
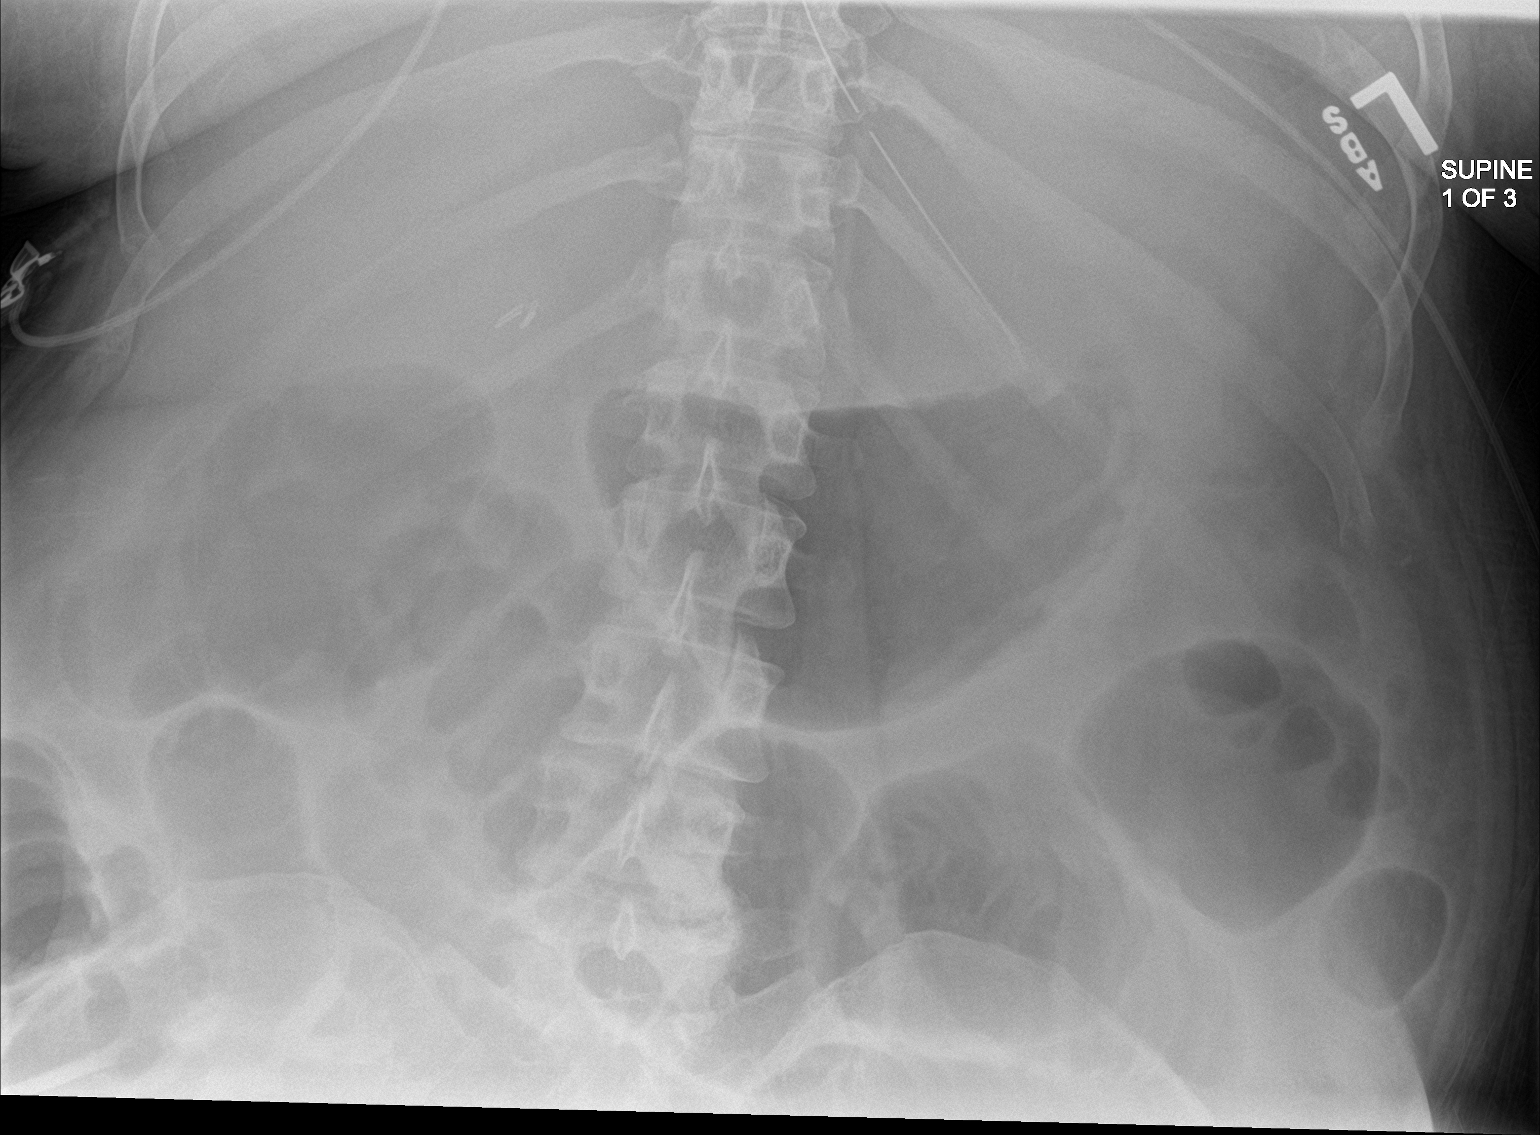

[abdomen kub (2 of 3)]
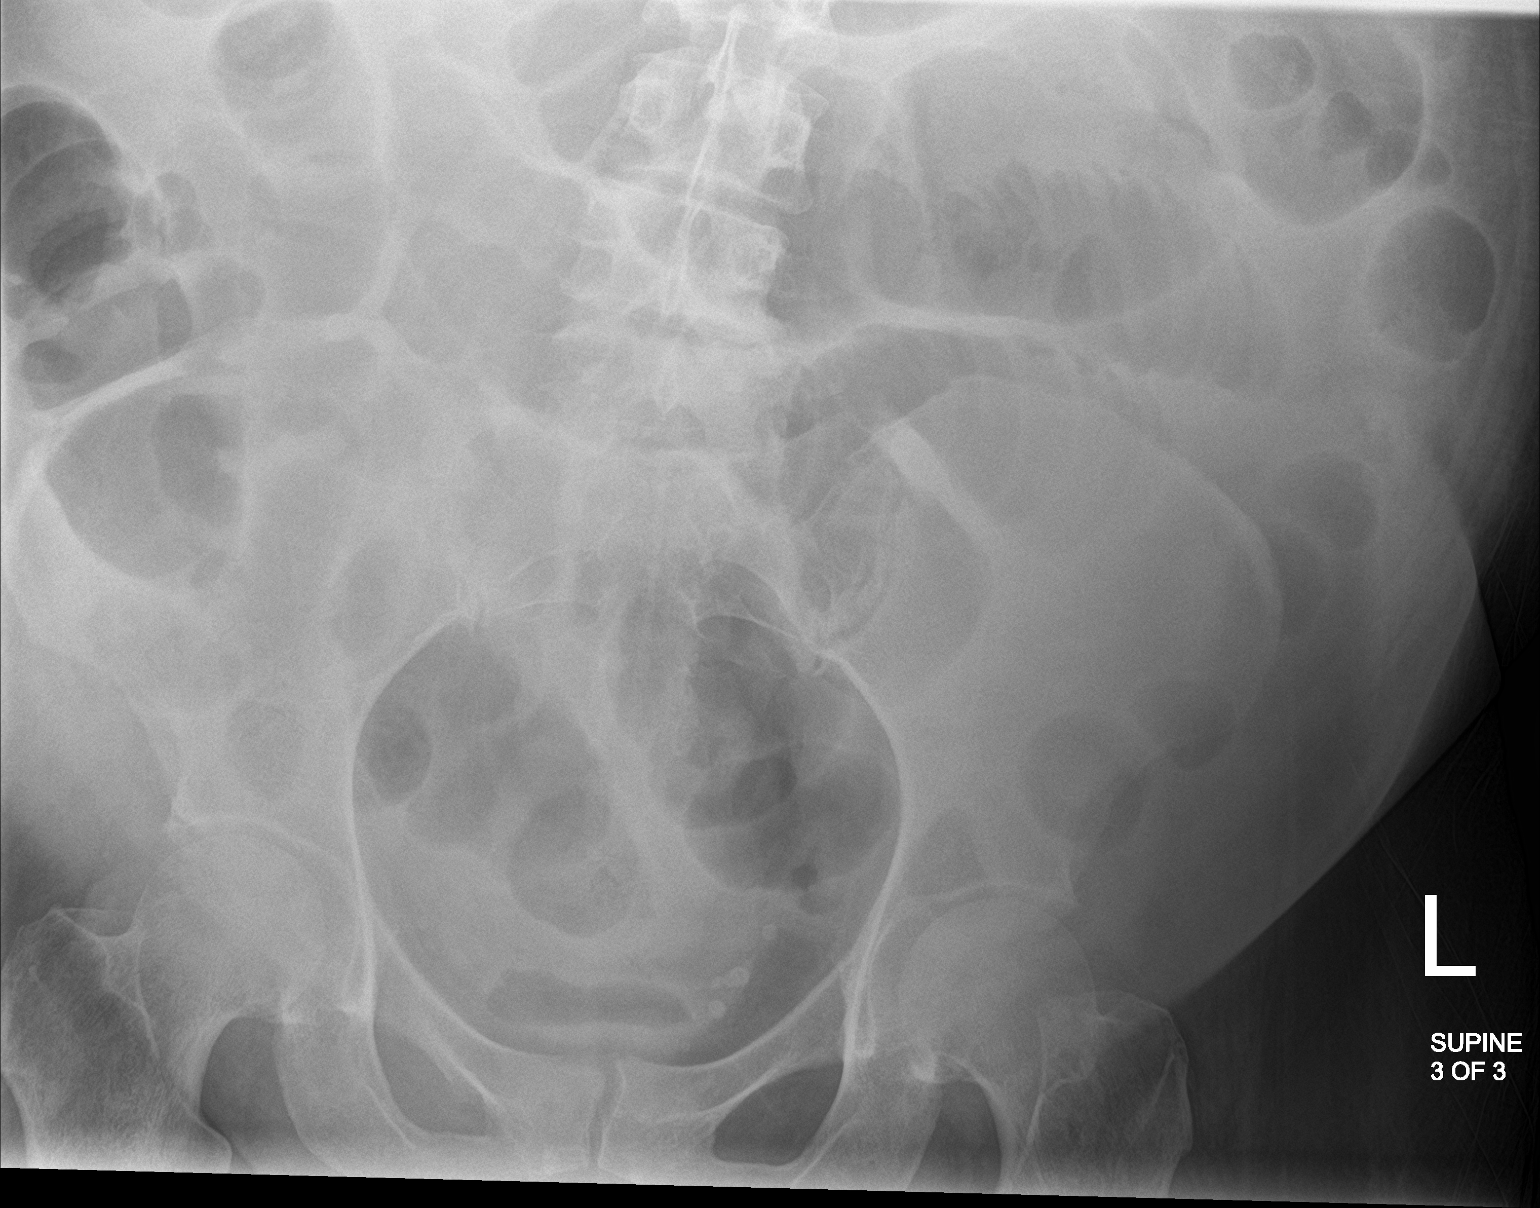

[abdomen kub (3 of 3)]
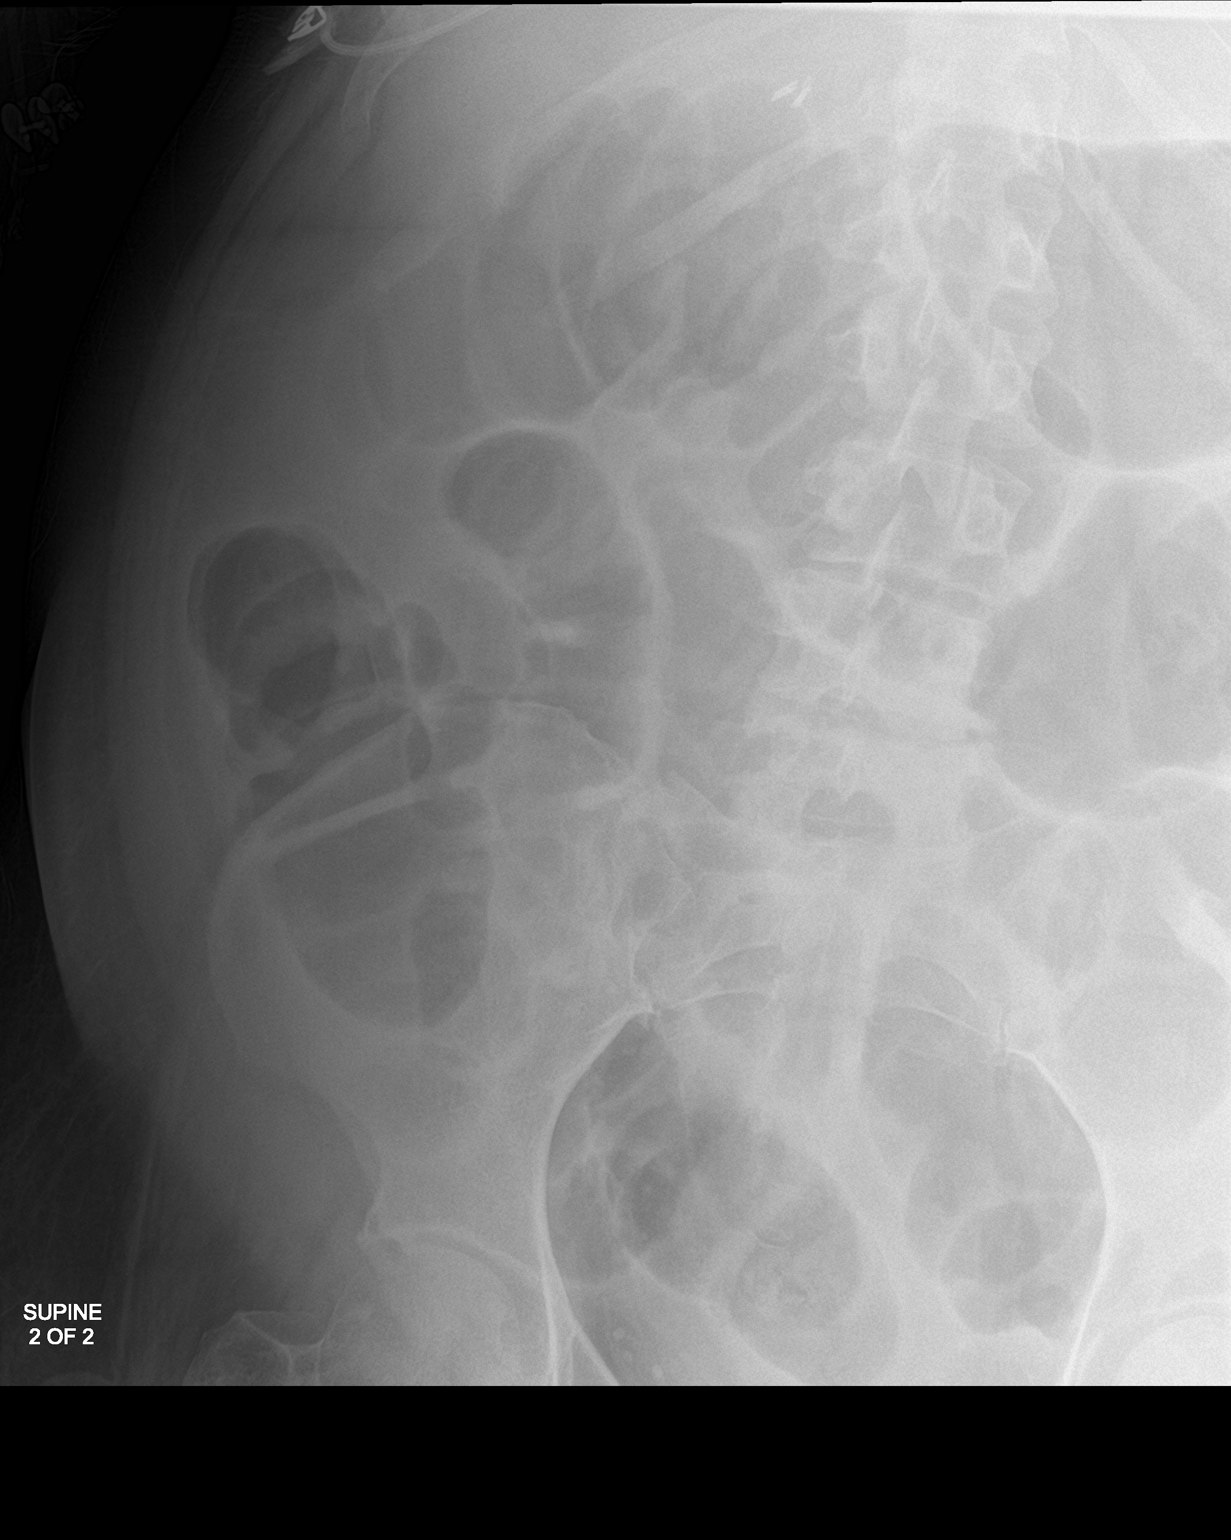

[3 of 3 positions shown; findings below may reference images not displayed]

FINDINGS: Interval development of abnormal gaseous distension of small bowel
loops in the mid abdomen with maximum transverse diameter of 4.6 cm.
The colonic bowel gas pattern is preserved. The diaphragms are
excluded by collimation and therefore the evaluation for free gas is
limited.

Enteric catheter within the proximal stomach.
IMPRESSION: Interval development of abnormal gas distended small bowel loops in
the mid abdomen concerning for early/intermittent small bowel
obstruction or ileus.

## 2019-04-04 IMAGING — DX DG CHEST 1V
1 series · 1 of 1 positions shown · non-contrast
Comparison: Yesterday

CLINICAL DATA: Leukocytosis

EXAM:
CHEST  1 VIEW

[chest ap]
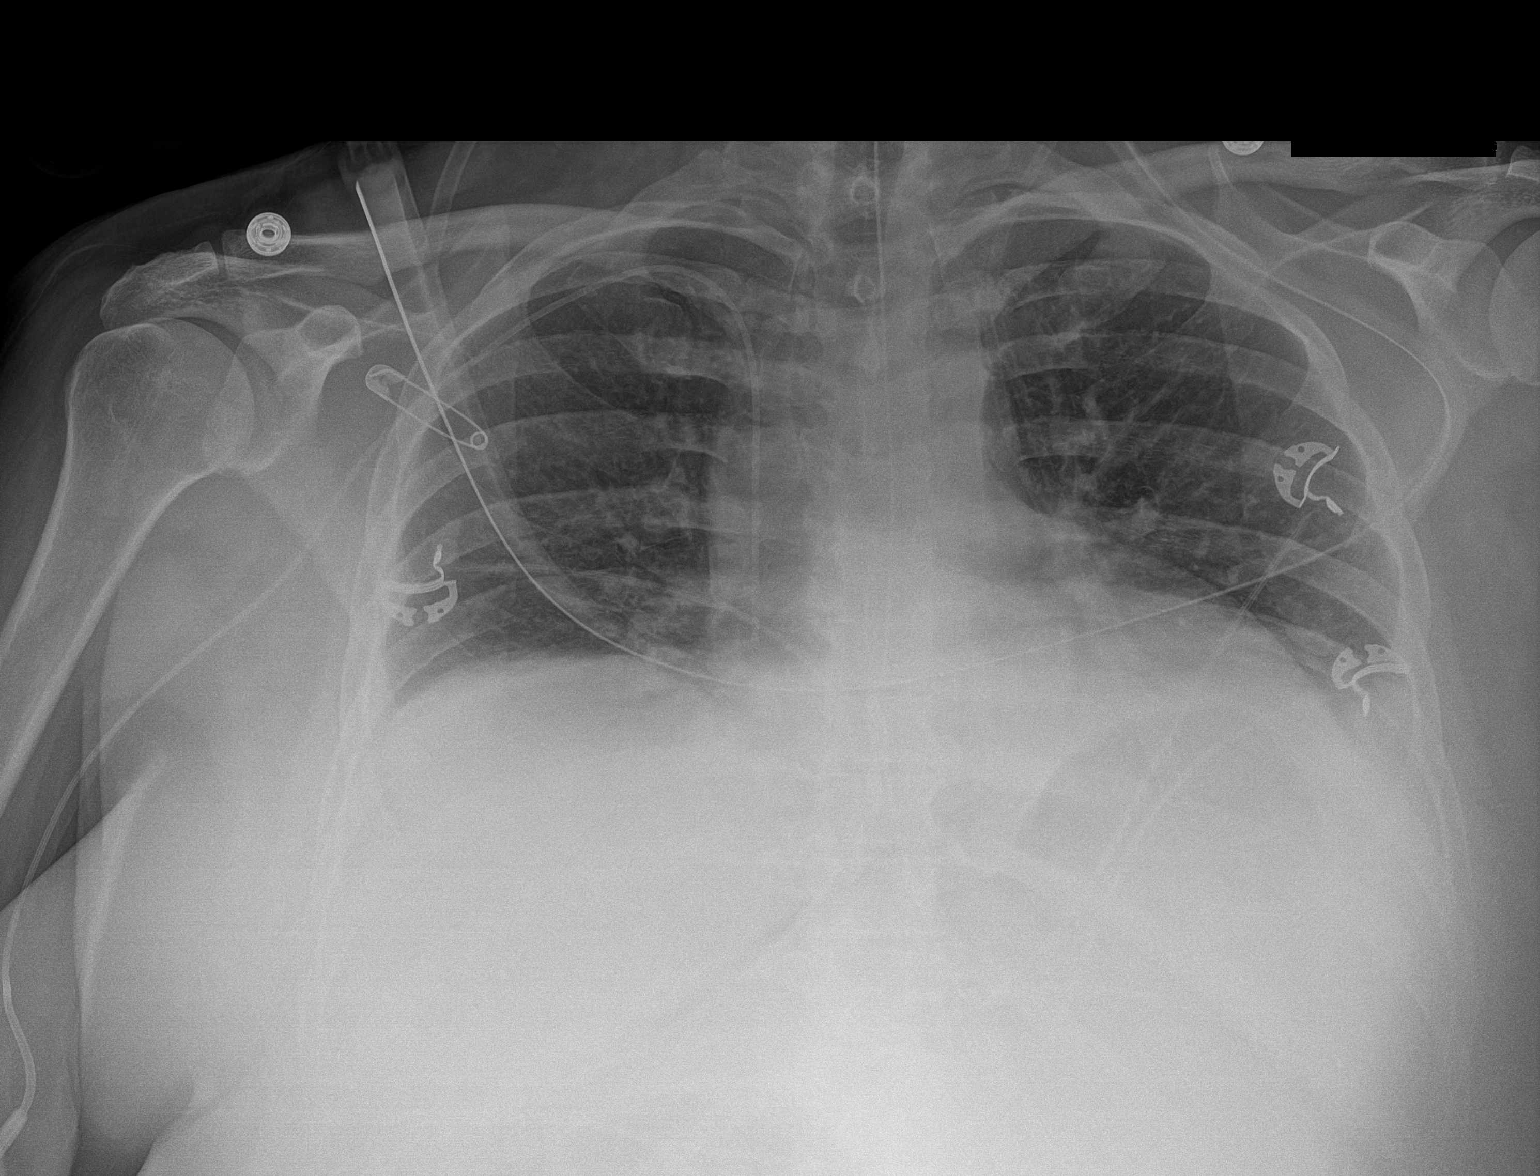

[1 of 1 positions shown; findings below may reference images not displayed]

FINDINGS: Gastric suction tube at least reaches the stomach. Right upper
extremity PICC with tip at the upper cavoatrial junction. Low volume
chest with streaky opacities over the diaphragm. No edema. No
visible pleural effusion, although small volume pleural fluid was
seen on abdominal CT 03/23/2018.. No pneumothorax. Partially seen
stomach shows gaseous distension.
IMPRESSION: Very low lung volumes with atelectasis in both lower lobes, stable
from yesterday. Superimposed infection is not excluded.

## 2019-07-07 ENCOUNTER — Ambulatory Visit: Payer: Self-pay

## 2019-07-14 ENCOUNTER — Telehealth: Payer: Self-pay | Admitting: General Practice

## 2019-07-14 ENCOUNTER — Telehealth: Payer: Self-pay | Admitting: Family Medicine
